# Patient Record
Sex: Female | Born: 1960
Health system: Southern US, Community
[De-identification: ages and names within clinical notes are randomized; demographics above are authoritative.]

## PROBLEM LIST (undated history)

## (undated) DIAGNOSIS — F329 Major depressive disorder, single episode, unspecified: Secondary | ICD-10-CM

## (undated) DIAGNOSIS — N319 Neuromuscular dysfunction of bladder, unspecified: Secondary | ICD-10-CM

## (undated) DIAGNOSIS — K219 Gastro-esophageal reflux disease without esophagitis: Secondary | ICD-10-CM

## (undated) DIAGNOSIS — R002 Palpitations: Secondary | ICD-10-CM

## (undated) DIAGNOSIS — K760 Fatty (change of) liver, not elsewhere classified: Secondary | ICD-10-CM

## (undated) DIAGNOSIS — T4145XA Adverse effect of unspecified anesthetic, initial encounter: Secondary | ICD-10-CM

## (undated) DIAGNOSIS — H539 Unspecified visual disturbance: Secondary | ICD-10-CM

## (undated) DIAGNOSIS — G2581 Restless legs syndrome: Secondary | ICD-10-CM

## (undated) DIAGNOSIS — I2510 Atherosclerotic heart disease of native coronary artery without angina pectoris: Secondary | ICD-10-CM

## (undated) DIAGNOSIS — I499 Cardiac arrhythmia, unspecified: Secondary | ICD-10-CM

## (undated) DIAGNOSIS — E039 Hypothyroidism, unspecified: Secondary | ICD-10-CM

## (undated) DIAGNOSIS — K5900 Constipation, unspecified: Secondary | ICD-10-CM

## (undated) DIAGNOSIS — I10 Essential (primary) hypertension: Secondary | ICD-10-CM

## (undated) DIAGNOSIS — R413 Other amnesia: Secondary | ICD-10-CM

## (undated) DIAGNOSIS — G629 Polyneuropathy, unspecified: Secondary | ICD-10-CM

## (undated) DIAGNOSIS — C801 Malignant (primary) neoplasm, unspecified: Secondary | ICD-10-CM

## (undated) DIAGNOSIS — G4730 Sleep apnea, unspecified: Secondary | ICD-10-CM

## (undated) DIAGNOSIS — M1990 Unspecified osteoarthritis, unspecified site: Secondary | ICD-10-CM

## (undated) DIAGNOSIS — C4490 Unspecified malignant neoplasm of skin, unspecified: Secondary | ICD-10-CM

## (undated) DIAGNOSIS — E119 Type 2 diabetes mellitus without complications: Secondary | ICD-10-CM

## (undated) DIAGNOSIS — G259 Extrapyramidal and movement disorder, unspecified: Secondary | ICD-10-CM

## (undated) DIAGNOSIS — Z8669 Personal history of other diseases of the nervous system and sense organs: Secondary | ICD-10-CM

## (undated) DIAGNOSIS — Z8489 Family history of other specified conditions: Secondary | ICD-10-CM

## (undated) DIAGNOSIS — F32A Depression, unspecified: Secondary | ICD-10-CM

## (undated) DIAGNOSIS — F419 Anxiety disorder, unspecified: Secondary | ICD-10-CM

## (undated) DIAGNOSIS — G35D Multiple sclerosis, unspecified: Secondary | ICD-10-CM

## (undated) DIAGNOSIS — G35 Multiple sclerosis: Secondary | ICD-10-CM

## (undated) DIAGNOSIS — R51 Headache: Secondary | ICD-10-CM

## (undated) DIAGNOSIS — E0590 Thyrotoxicosis, unspecified without thyrotoxic crisis or storm: Secondary | ICD-10-CM

## (undated) DIAGNOSIS — Z87898 Personal history of other specified conditions: Secondary | ICD-10-CM

## (undated) HISTORY — PX: TUBAL LIGATION: SHX77

## (undated) HISTORY — PX: ENDOMETRIAL ABLATION: SHX621

## (undated) HISTORY — DX: Atherosclerotic heart disease of native coronary artery without angina pectoris: I25.10

## (undated) HISTORY — DX: Type 2 diabetes mellitus without complications: E11.9

## (undated) HISTORY — DX: Essential (primary) hypertension: I10

## (undated) HISTORY — DX: Multiple sclerosis, unspecified: G35.D

## (undated) HISTORY — DX: Personal history of other diseases of the nervous system and sense organs: Z86.69

## (undated) HISTORY — DX: Unspecified malignant neoplasm of skin, unspecified: C44.90

## (undated) HISTORY — PX: CHOLECYSTECTOMY: SHX55

## (undated) HISTORY — DX: Unspecified visual disturbance: H53.9

## (undated) HISTORY — DX: Multiple sclerosis: G35

## (undated) HISTORY — DX: Polyneuropathy, unspecified: G62.9

## (undated) HISTORY — PX: TONSILLECTOMY: SUR1361

## (undated) HISTORY — DX: Extrapyramidal and movement disorder, unspecified: G25.9

## (undated) HISTORY — PX: JOINT REPLACEMENT: SHX530

## (undated) HISTORY — DX: Palpitations: R00.2

---

## 1998-01-09 ENCOUNTER — Ambulatory Visit (HOSPITAL_COMMUNITY): Admission: RE | Admit: 1998-01-09 | Discharge: 1998-01-09 | Payer: Self-pay | Admitting: Neurosurgery

## 1998-01-09 ENCOUNTER — Encounter: Payer: Self-pay | Admitting: Neurosurgery

## 2001-05-25 ENCOUNTER — Ambulatory Visit (HOSPITAL_COMMUNITY): Admission: RE | Admit: 2001-05-25 | Discharge: 2001-05-25 | Payer: Self-pay | Admitting: Internal Medicine

## 2001-05-25 ENCOUNTER — Encounter (INDEPENDENT_AMBULATORY_CARE_PROVIDER_SITE_OTHER): Payer: Self-pay | Admitting: Internal Medicine

## 2002-07-19 ENCOUNTER — Other Ambulatory Visit: Admission: RE | Admit: 2002-07-19 | Discharge: 2002-07-19 | Payer: Self-pay | Admitting: Obstetrics and Gynecology

## 2002-07-21 ENCOUNTER — Ambulatory Visit (HOSPITAL_COMMUNITY): Admission: RE | Admit: 2002-07-21 | Discharge: 2002-07-21 | Payer: Self-pay | Admitting: Obstetrics and Gynecology

## 2003-05-24 ENCOUNTER — Ambulatory Visit (HOSPITAL_COMMUNITY): Admission: RE | Admit: 2003-05-24 | Discharge: 2003-05-24 | Payer: Self-pay | Admitting: Obstetrics and Gynecology

## 2003-06-14 ENCOUNTER — Encounter (HOSPITAL_COMMUNITY): Admission: RE | Admit: 2003-06-14 | Discharge: 2003-06-15 | Payer: Self-pay | Admitting: Endocrinology

## 2003-07-13 ENCOUNTER — Other Ambulatory Visit: Admission: RE | Admit: 2003-07-13 | Discharge: 2003-07-13 | Payer: Self-pay | Admitting: Obstetrics and Gynecology

## 2003-11-14 ENCOUNTER — Ambulatory Visit (HOSPITAL_COMMUNITY): Admission: RE | Admit: 2003-11-14 | Discharge: 2003-11-14 | Payer: Self-pay | Admitting: Family Medicine

## 2003-11-22 ENCOUNTER — Encounter (HOSPITAL_COMMUNITY): Admission: RE | Admit: 2003-11-22 | Discharge: 2003-12-22 | Payer: Self-pay | Admitting: Emergency Medicine

## 2003-12-27 ENCOUNTER — Ambulatory Visit (HOSPITAL_COMMUNITY): Admission: RE | Admit: 2003-12-27 | Discharge: 2003-12-27 | Payer: Self-pay | Admitting: Neurology

## 2003-12-31 ENCOUNTER — Emergency Department (HOSPITAL_COMMUNITY): Admission: EM | Admit: 2003-12-31 | Discharge: 2003-12-31 | Payer: Self-pay | Admitting: Emergency Medicine

## 2003-12-31 ENCOUNTER — Encounter: Admission: RE | Admit: 2003-12-31 | Discharge: 2003-12-31 | Payer: Self-pay | Admitting: Neurology

## 2004-03-14 ENCOUNTER — Ambulatory Visit (HOSPITAL_COMMUNITY): Admission: RE | Admit: 2004-03-14 | Discharge: 2004-03-14 | Payer: Self-pay | Admitting: Endocrinology

## 2004-05-28 ENCOUNTER — Emergency Department (HOSPITAL_COMMUNITY): Admission: EM | Admit: 2004-05-28 | Discharge: 2004-05-28 | Payer: Self-pay | Admitting: Emergency Medicine

## 2007-03-23 ENCOUNTER — Ambulatory Visit (HOSPITAL_COMMUNITY): Admission: RE | Admit: 2007-03-23 | Discharge: 2007-03-23 | Payer: Self-pay | Admitting: *Deleted

## 2008-02-15 ENCOUNTER — Emergency Department (HOSPITAL_COMMUNITY): Admission: EM | Admit: 2008-02-15 | Discharge: 2008-02-15 | Payer: Self-pay | Admitting: Emergency Medicine

## 2010-03-31 ENCOUNTER — Encounter (INDEPENDENT_AMBULATORY_CARE_PROVIDER_SITE_OTHER): Payer: Self-pay | Admitting: *Deleted

## 2010-04-17 ENCOUNTER — Other Ambulatory Visit
Admission: RE | Admit: 2010-04-17 | Discharge: 2010-04-17 | Payer: Self-pay | Source: Home / Self Care | Admitting: Obstetrics & Gynecology

## 2010-04-21 ENCOUNTER — Encounter (HOSPITAL_COMMUNITY)
Admission: RE | Admit: 2010-04-21 | Discharge: 2010-05-21 | Payer: Self-pay | Source: Home / Self Care | Attending: Family Medicine | Admitting: Family Medicine

## 2010-04-23 ENCOUNTER — Ambulatory Visit (HOSPITAL_COMMUNITY)
Admission: RE | Admit: 2010-04-23 | Discharge: 2010-04-23 | Payer: Self-pay | Source: Home / Self Care | Attending: General Surgery | Admitting: General Surgery

## 2010-05-10 ENCOUNTER — Inpatient Hospital Stay (HOSPITAL_COMMUNITY)
Admission: EM | Admit: 2010-05-10 | Discharge: 2010-05-12 | Payer: Self-pay | Source: Home / Self Care | Attending: Internal Medicine | Admitting: Internal Medicine

## 2010-05-11 ENCOUNTER — Encounter (INDEPENDENT_AMBULATORY_CARE_PROVIDER_SITE_OTHER): Payer: Self-pay | Admitting: Internal Medicine

## 2010-05-19 LAB — CARDIAC PANEL(CRET KIN+CKTOT+MB+TROPI)
CK, MB: 0.8 ng/mL (ref 0.3–4.0)
CK, MB: 0.8 ng/mL (ref 0.3–4.0)
CK, MB: 1 ng/mL (ref 0.3–4.0)
Relative Index: INVALID (ref 0.0–2.5)
Relative Index: INVALID (ref 0.0–2.5)
Relative Index: INVALID (ref 0.0–2.5)
Total CK: 37 U/L (ref 7–177)
Total CK: 37 U/L (ref 7–177)
Total CK: 42 U/L (ref 7–177)
Troponin I: 0.01 ng/mL (ref 0.00–0.06)
Troponin I: 0.01 ng/mL (ref 0.00–0.06)
Troponin I: <0.01 ng/mL (ref 0.00–0.06)

## 2010-05-19 LAB — HEMOGLOBIN A1C
Hgb A1c MFr Bld: 6.4 % — ABNORMAL HIGH (ref <5.7)
Mean Plasma Glucose: 137 mg/dL — ABNORMAL HIGH (ref <117)

## 2010-05-19 LAB — URINE CULTURE
Colony Count: NO GROWTH
Culture  Setup Time: 201201082210
Culture: NO GROWTH
Special Requests: POSITIVE

## 2010-05-19 LAB — COMPREHENSIVE METABOLIC PANEL
ALT: 21 U/L (ref 0–35)
AST: 22 U/L (ref 0–37)
Albumin: 4 g/dL (ref 3.5–5.2)
Alkaline Phosphatase: 58 U/L (ref 39–117)
BUN: 14 mg/dL (ref 6–23)
CO2: 24 mEq/L (ref 19–32)
Calcium: 9.7 mg/dL (ref 8.4–10.5)
Chloride: 106 mEq/L (ref 96–112)
Creatinine, Ser: 0.88 mg/dL (ref 0.4–1.2)
GFR calc Af Amer: >60 mL/min (ref >60)
GFR calc non Af Amer: >60 mL/min (ref >60)
Glucose, Bld: 85 mg/dL (ref 70–99)
Potassium: 4.3 mEq/L (ref 3.5–5.1)
Sodium: 139 mEq/L (ref 135–145)
Total Bilirubin: 0.6 mg/dL (ref 0.3–1.2)
Total Protein: 6.8 g/dL (ref 6.0–8.3)

## 2010-05-19 LAB — LIPID PANEL
Cholesterol: 143 mg/dL (ref 0–200)
HDL: 22 mg/dL — ABNORMAL LOW (ref >39)
LDL Cholesterol: 69 mg/dL (ref 0–99)
Total CHOL/HDL Ratio: 6.5 RATIO
Triglycerides: 258 mg/dL — ABNORMAL HIGH (ref <150)
VLDL: 52 mg/dL — ABNORMAL HIGH (ref 0–40)

## 2010-05-19 LAB — DIFFERENTIAL
Basophils Absolute: 0 10*3/uL (ref 0.0–0.1)
Basophils Relative: 0 % (ref 0–1)
Eosinophils Absolute: 0.1 10*3/uL (ref 0.0–0.7)
Eosinophils Relative: 1 % (ref 0–5)
Lymphocytes Relative: 30 % (ref 12–46)
Lymphs Abs: 2.3 10*3/uL (ref 0.7–4.0)
Monocytes Absolute: 0.6 10*3/uL (ref 0.1–1.0)
Monocytes Relative: 8 % (ref 3–12)
Neutro Abs: 4.6 10*3/uL (ref 1.7–7.7)
Neutrophils Relative %: 61 % (ref 43–77)

## 2010-05-19 LAB — URINALYSIS, ROUTINE W REFLEX MICROSCOPIC
Bilirubin Urine: NEGATIVE
Hgb urine dipstick: NEGATIVE
Ketones, ur: NEGATIVE mg/dL
Nitrite: NEGATIVE
Protein, ur: NEGATIVE mg/dL
Specific Gravity, Urine: 1.031 — ABNORMAL HIGH (ref 1.005–1.030)
Urine Glucose, Fasting: NEGATIVE mg/dL
Urobilinogen, UA: 0.2 mg/dL (ref 0.0–1.0)
pH: 5.5 (ref 5.0–8.0)

## 2010-05-19 LAB — CBC
HCT: 36 % (ref 36.0–46.0)
HCT: 29.1 % — ABNORMAL LOW (ref 36.0–46.0)
Hemoglobin: 12.1 g/dL (ref 12.0–15.0)
Hemoglobin: 9.7 g/dL — ABNORMAL LOW (ref 12.0–15.0)
MCH: 28.3 pg (ref 26.0–34.0)
MCH: 28 pg (ref 26.0–34.0)
MCHC: 33.6 g/dL (ref 30.0–36.0)
MCHC: 33.3 g/dL (ref 30.0–36.0)
MCV: 84.3 fL (ref 78.0–100.0)
MCV: 84.1 fL (ref 78.0–100.0)
Platelets: 232 10*3/uL (ref 150–400)
Platelets: 159 10*3/uL (ref 150–400)
RBC: 4.27 MIL/uL (ref 3.87–5.11)
RBC: 3.46 MIL/uL — ABNORMAL LOW (ref 3.87–5.11)
RDW: 16.5 % — ABNORMAL HIGH (ref 11.5–15.5)
RDW: 16.4 % — ABNORMAL HIGH (ref 11.5–15.5)
WBC: 7.5 10*3/uL (ref 4.0–10.5)
WBC: 6 10*3/uL (ref 4.0–10.5)

## 2010-05-19 LAB — POCT PREGNANCY, URINE: Preg Test, Ur: NEGATIVE

## 2010-05-19 LAB — D-DIMER, QUANTITATIVE
D-Dimer, Quant: 0.23 ug/mL-FEU (ref 0.00–0.48)
D-Dimer, Quant: <0.22 ug/mL-FEU (ref 0.00–0.48)

## 2010-05-19 LAB — LIPASE, BLOOD: Lipase: 23 U/L (ref 11–59)

## 2010-05-19 LAB — URINE MICROSCOPIC-ADD ON

## 2010-05-19 LAB — GLUCOSE, CAPILLARY
Glucose-Capillary: 142 mg/dL — ABNORMAL HIGH (ref 70–99)
Glucose-Capillary: 148 mg/dL — ABNORMAL HIGH (ref 70–99)
Glucose-Capillary: 109 mg/dL — ABNORMAL HIGH (ref 70–99)

## 2010-05-19 LAB — PROTIME-INR
INR: 1.08 (ref 0.00–1.49)
Prothrombin Time: 14.2 seconds (ref 11.6–15.2)

## 2010-05-19 LAB — POCT CARDIAC MARKERS
CKMB, poc: <1 ng/mL — ABNORMAL LOW (ref 1.0–8.0)
Myoglobin, poc: 68 ng/mL (ref 12–200)
Troponin i, poc: <0.05 ng/mL (ref 0.00–0.09)

## 2010-05-19 LAB — BASIC METABOLIC PANEL
BUN: 7 mg/dL (ref 6–23)
CO2: 23 mEq/L (ref 19–32)
Calcium: 8.4 mg/dL (ref 8.4–10.5)
Chloride: 112 mEq/L (ref 96–112)
Creatinine, Ser: 0.96 mg/dL (ref 0.4–1.2)
GFR calc Af Amer: >60 mL/min (ref >60)
GFR calc non Af Amer: >60 mL/min (ref >60)
Glucose, Bld: 142 mg/dL — ABNORMAL HIGH (ref 70–99)
Potassium: 3.8 mEq/L (ref 3.5–5.1)
Sodium: 141 mEq/L (ref 135–145)

## 2010-05-19 LAB — T4, FREE: Free T4: 1.53 ng/dL (ref 0.80–1.80)

## 2010-05-19 LAB — TSH: TSH: 0.056 u[IU]/mL — ABNORMAL LOW (ref 0.350–4.500)

## 2010-05-19 LAB — APTT: aPTT: 28 seconds (ref 24–37)

## 2010-05-19 LAB — MAGNESIUM: Magnesium: 2.1 mg/dL (ref 1.5–2.5)

## 2010-05-19 LAB — MRSA PCR SCREENING: MRSA by PCR: NEGATIVE

## 2010-05-19 LAB — PHOSPHORUS: Phosphorus: 3.1 mg/dL (ref 2.3–4.6)

## 2010-06-03 NOTE — Letter (Signed)
Summary: Appointment - Missed  [facility]  [address] [place] [address]   Phone: [phone]  Fax: [phone]     March 31, 2010 MRN: [id number]   Mary Strong [address] [place], [place]  [address]   Dear Ms. Andrey Campanile,  Our records indicate you missed your appointment on      03/31/10                  with Dr.       Diona Browner.                                    It is very important that we reach you to reschedule this appointment. We look forward to participating in your health care needs. Please contact us at the number listed above at your earliest convenience to reschedule this appointment.     Sincerely,    Glass blower/designer

## 2010-06-21 ENCOUNTER — Other Ambulatory Visit: Payer: Self-pay | Admitting: Emergency Medicine

## 2010-06-21 ENCOUNTER — Emergency Department (HOSPITAL_COMMUNITY)
Admission: EM | Admit: 2010-06-21 | Discharge: 2010-06-21 | Disposition: A | Discharge status: Home / Self Care | Payer: 59 | Attending: Emergency Medicine | Admitting: Emergency Medicine

## 2010-06-21 DIAGNOSIS — E119 Type 2 diabetes mellitus without complications: Secondary | ICD-10-CM | POA: Insufficient documentation

## 2010-06-21 DIAGNOSIS — G35 Multiple sclerosis: Secondary | ICD-10-CM | POA: Insufficient documentation

## 2010-06-21 DIAGNOSIS — D649 Anemia, unspecified: Secondary | ICD-10-CM | POA: Insufficient documentation

## 2010-06-21 DIAGNOSIS — IMO0001 Reserved for inherently not codable concepts without codable children: Secondary | ICD-10-CM | POA: Insufficient documentation

## 2010-06-21 DIAGNOSIS — E0590 Thyrotoxicosis, unspecified without thyrotoxic crisis or storm: Secondary | ICD-10-CM | POA: Insufficient documentation

## 2010-06-21 DIAGNOSIS — I10 Essential (primary) hypertension: Secondary | ICD-10-CM | POA: Insufficient documentation

## 2010-06-21 LAB — BASIC METABOLIC PANEL
BUN: 14 mg/dL (ref 6–23)
CO2: 26 mEq/L (ref 19–32)
Calcium: 9 mg/dL (ref 8.4–10.5)
Chloride: 104 mEq/L (ref 96–112)
Creatinine, Ser: 0.83 mg/dL (ref 0.4–1.2)
GFR calc Af Amer: >60 mL/min (ref >60)
GFR calc non Af Amer: >60 mL/min (ref >60)
Glucose, Bld: 91 mg/dL (ref 70–99)
Potassium: 3.4 mEq/L — ABNORMAL LOW (ref 3.5–5.1)
Sodium: 138 mEq/L (ref 135–145)

## 2010-06-21 LAB — DIFFERENTIAL
Basophils Absolute: 0 10*3/uL (ref 0.0–0.1)
Basophils Relative: 0 % (ref 0–1)
Eosinophils Absolute: 0.2 10*3/uL (ref 0.0–0.7)
Eosinophils Relative: 2 % (ref 0–5)
Lymphocytes Relative: 31 % (ref 12–46)
Lymphs Abs: 2.6 10*3/uL (ref 0.7–4.0)
Monocytes Absolute: 0.5 10*3/uL (ref 0.1–1.0)
Monocytes Relative: 6 % (ref 3–12)
Neutro Abs: 5.2 10*3/uL (ref 1.7–7.7)
Neutrophils Relative %: 61 % (ref 43–77)

## 2010-06-21 LAB — CBC
HCT: 32.1 % — ABNORMAL LOW (ref 36.0–46.0)
Hemoglobin: 10.6 g/dL — ABNORMAL LOW (ref 12.0–15.0)
MCH: 28 pg (ref 26.0–34.0)
MCHC: 33 g/dL (ref 30.0–36.0)
MCV: 84.7 fL (ref 78.0–100.0)
Platelets: 201 10*3/uL (ref 150–400)
RBC: 3.79 MIL/uL — ABNORMAL LOW (ref 3.87–5.11)
RDW: 17.3 % — ABNORMAL HIGH (ref 11.5–15.5)
WBC: 8.5 10*3/uL (ref 4.0–10.5)

## 2010-06-21 LAB — URINALYSIS, ROUTINE W REFLEX MICROSCOPIC
Bilirubin Urine: NEGATIVE
Hgb urine dipstick: NEGATIVE
Ketones, ur: NEGATIVE mg/dL
Nitrite: NEGATIVE
Protein, ur: NEGATIVE mg/dL
Specific Gravity, Urine: 1.02 (ref 1.005–1.030)
Urine Glucose, Fasting: NEGATIVE mg/dL
Urobilinogen, UA: 0.2 mg/dL (ref 0.0–1.0)
pH: 6 (ref 5.0–8.0)

## 2010-06-21 LAB — CK TOTAL AND CKMB (NOT AT ARMC)
CK, MB: 1.2 ng/mL (ref 0.3–4.0)
Relative Index: INVALID (ref 0.0–2.5)
Total CK: 75 U/L (ref 7–177)

## 2010-06-21 LAB — TROPONIN I: Troponin I: <0.01 ng/mL (ref 0.00–0.06)

## 2010-07-14 LAB — CBC
HCT: 34.5 % — ABNORMAL LOW (ref 36.0–46.0)
Hemoglobin: 11.8 g/dL — ABNORMAL LOW (ref 12.0–15.0)
MCH: 28 pg (ref 26.0–34.0)
MCHC: 34.2 g/dL (ref 30.0–36.0)
MCV: 81.8 fL (ref 78.0–100.0)
Platelets: 219 10*3/uL (ref 150–400)
RBC: 4.22 MIL/uL (ref 3.87–5.11)
RDW: 16.4 % — ABNORMAL HIGH (ref 11.5–15.5)
WBC: 8.2 10*3/uL (ref 4.0–10.5)

## 2010-07-14 LAB — BASIC METABOLIC PANEL
BUN: 15 mg/dL (ref 6–23)
CO2: 25 mEq/L (ref 19–32)
Calcium: 9.4 mg/dL (ref 8.4–10.5)
Chloride: 103 mEq/L (ref 96–112)
Creatinine, Ser: 0.93 mg/dL (ref 0.4–1.2)
GFR calc Af Amer: >60 mL/min (ref >60)
GFR calc non Af Amer: >60 mL/min (ref >60)
Glucose, Bld: 103 mg/dL — ABNORMAL HIGH (ref 70–99)
Potassium: 4.3 mEq/L (ref 3.5–5.1)
Sodium: 138 mEq/L (ref 135–145)

## 2010-07-14 LAB — HEPATIC FUNCTION PANEL
ALT: 25 U/L (ref 0–35)
AST: 24 U/L (ref 0–37)
Albumin: 4 g/dL (ref 3.5–5.2)
Alkaline Phosphatase: 56 U/L (ref 39–117)
Bilirubin, Direct: 0.1 mg/dL (ref 0.0–0.3)
Indirect Bilirubin: 0.4 mg/dL (ref 0.3–0.9)
Total Bilirubin: 0.5 mg/dL (ref 0.3–1.2)
Total Protein: 6.3 g/dL (ref 6.0–8.3)

## 2010-07-14 LAB — SURGICAL PCR SCREEN
MRSA, PCR: NEGATIVE
Staphylococcus aureus: POSITIVE — AB

## 2010-07-14 LAB — GLUCOSE, CAPILLARY: Glucose-Capillary: 135 mg/dL — ABNORMAL HIGH (ref 70–99)

## 2010-09-19 NOTE — H&P (Signed)
NAME:  Mary Strong, Mary Strong                        ACCOUNT NO.:  [id number]   MEDICAL RECORD NO.:  [id number]                   PATIENT TYPE:  AMB   LOCATION:  DAY                                  FACILITY:  [facility]   PHYSICIAN:  Tilda Burrow, M.D.              DATE OF BIRTH:  1960-12-05   DATE OF ADMISSION:  DATE OF DISCHARGE:                                HISTORY & PHYSICAL   PREOPERATIVE DIAGNOSIS:  Menometrorrhagia.   HISTORY OF PRESENT ILLNESS:  This 50 year old is admitted for endometrial  ablation.  The patient has been seen in our office.  A recent Pap smear  showed mild dysplasia.  She has been evaluated with colposcopy today, July 19, 2002, showing no visible dysplasia.  It is felt that conservative  management is appropriate.   The patient is admitted at this time complaining of heavy periods lasting 9-  10 days per cycle with heavy flow.  The patient uses 48 super tampons per  months.  She has been mildly anemia due to the periods recently.  The  patient has been seen in our office and has had an ultrasound which shows an  8.7 cm long uterus with a thin endometrial stripe and no adnexal pathology.  There is a visible old C-section scar and irregularity to the anterior lower  uterine segment.  The patient is considered a good candidate for endometrial  ablation and desires to proceed in that direction.  The pros and cons of the  procedure have been performed using Gynecare endometrial ablation  instructional pamphlets.   PAST MEDICAL HISTORY:  Positive for hypertension currently not requiring  medication, though anxiety of the preoperative visit raised her blood  pressure to 180/112.  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  Final postoperative was 130/80.   PHYSICAL EXAMINATION:  HEIGHT:  5 feet 5 inches.  WEIGHT:  221 pounds.  VITAL SIGNS:  Blood pressure 130/80.  GENERAL APPEARANCE:  A large-framed Caucasian female.  HEENT:  Pupils equal, round, and reactive.  Extraocular movements intact.  NECK:  Supple.  Trachea midline.  ABDOMEN:  Obese without masses.  Well-healed surgical scar.  PELVIC:  External genitalia normal.  Cervix tiny with colposcopic  visualization showing no visible dysplasia.  Endocervical curettage is  pending.  The uterus is normal in size, shape, and contour with adequate  mobility.  Adnexa negative for masses.   ASSESSMENT:  Menorrhagia.   PLAN:  Hysteroscopy, D&C, and endometrial ablation on July 21, 2002.                                               Tilda Burrow, M.D.   JVF/MEDQ  D:  07/19/2002  T:  07/19/2002  Job:  [id number]

## 2010-09-19 NOTE — Op Note (Signed)
[name], Mary Strong                        ACCOUNT NO.:  [id number]   MEDICAL RECORD NO.:  [id number]                   PATIENT TYPE:  OUT   LOCATION:  MDC                                  FACILITY:  [facility]   PHYSICIAN:  Michael L. Thad Ranger, M.D.           DATE OF BIRTH:  1960-07-25   DATE OF PROCEDURE:  12/27/2003  DATE OF DISCHARGE:                                 OPERATIVE REPORT   PROCEDURE PERFORMED:  Diagnostic/therapeutic lumbar puncture.   OPERATOR:  Michael L. Thad Ranger, M.D.   INDICATIONS FOR PROCEDURE:  Suspected multiple sclerosis and suspected  pseudotumor cerebri.   DESCRIPTION OF PROCEDURE:  Informed consent was obtained and signed and  placed on the chart after the procedure, risks and benefits were explained  and the patient agreed to proceed.  She was placed in the right lateral  decubitus position and prepped and draped in the usual sterile fashion.  Local anesthesia was achieved with 2 mL of lidocaine.  Two attempts were  made at reaching the intraspinal space with a standard 20 gauge 3-1/2-inch  needle but these were unsuccessful due to the depth of the space.  After  additional local anesthesia, a 6 inch 22 gauge spinal needle was inserted at  the L3-4 interspace and advanced until clear CSF was obtained.  Opening  pressure was measured at 300 mmH2O.  Approximately 6 mL of clear spinal  fluid was obtained and sent for laboratory analysis as follows.  Tube #1, 2  mL, cell count, differential.  Tube #2, 2 mL, glucose and protein,  oligoclonal banding analysis.  Tube #3, 2 mL, to be held in lab.  Additional  spinal fluid, approximately 15 mL was withdrawn to reach a final closing  pressure of 150 mmH2O.  The patient did complain of headache at the end of  the procedure which subsequently improved.  The needle was withdrawn.  Hemostasis was obtained.  No immediate complications were noted.  The  patient was advised to remain flat one hour after the procedure and  then can  be discharged home.  She was advised to call or come to the emergency room  immediately if she develops fever, mental status changes, neck stiffness,  unrelenting back pain or weakness of the lower extremities and was advised  to call the office in the next couple of days if she developed postural  headache.                                               Michael L. Thad Ranger, M.D.    MLR/MEDQ  D:  12/27/2003  T:  12/27/2003  Job:  [id number]

## 2010-09-19 NOTE — Op Note (Signed)
   Mary Strong, Mary Strong                        ACCOUNT NO.:  [id number]   MEDICAL RECORD NO.:  [id number]                   PATIENT TYPE:  AMB   LOCATION:  DAY                                  FACILITY:  [facility]   PHYSICIAN:  Tilda Burrow, M.D.              DATE OF BIRTH:  Apr 08, 1961   DATE OF PROCEDURE:  07/21/2002  DATE OF DISCHARGE:  07/21/2002                                 OPERATIVE REPORT   PREOPERATIVE DIAGNOSIS:  Menorrhagia.   POSTOPERATIVE DIAGNOSIS:  Menorrhagia.   PROCEDURE:  Hysteroscopy, D&C, endometrial ablation.   SURGEON:  Tilda Burrow, M.D.   ASSISTANTJohnny Bridge ______.   ANESTHESIA:  General.   COMPLICATIONS:  None.   FINDINGS:  The uterus sounded to 9.5 cm.  Generous endometrial tissue was  obtained prior to endometrial ablation.   DESCRIPTION OF PROCEDURE:  The patient was taken to the operating room and  received IV Levaquin preoperatively with Toradol 30 mg IV preoperatively.  She was taken to the OR and general anesthesia introduced.  The legs were  supported in [place]  low lithotomy supports, and the uterus sounded to  9.5 cm, dilated to 31 [place], allowing introduction of the diagnostic  hysteroscope without difficulty.  The uterus was visualized and documented  as having essentially normal contours.  A generous amount of somewhat shaggy  tissue was identified, and sharp curettage obtained five tissue samples from  that area and reduced the endometrial tissue bulk.  The patient then had  introduction of the Gynecare ThermaChoice endometrial thermal ablation  balloon with dilation and use through a standard eight minute time sequence.  The procedure was completed.  The balloon was deflated.  The patient left  for the recovery room in good condition.   The pathology report showed dyssynchronous endometrium with inflammatory  components, no hyperplasia or malignancy identified.                                               Tilda Burrow, M.D.    JVF/MEDQ  D:  09/11/2002  T:  09/12/2002  Job:  [id number]

## 2011-02-10 LAB — CREATININE, SERUM
Creatinine, Ser: 1.01
GFR calc Af Amer: >60
GFR calc non Af Amer: 59 — ABNORMAL LOW

## 2011-04-03 ENCOUNTER — Other Ambulatory Visit (HOSPITAL_COMMUNITY): Payer: Self-pay | Admitting: *Deleted

## 2011-05-11 ENCOUNTER — Other Ambulatory Visit (HOSPITAL_COMMUNITY)
Admission: RE | Admit: 2011-05-11 | Discharge: 2011-05-11 | Disposition: A | Discharge status: Home / Self Care | Payer: BC Managed Care – PPO | Source: Ambulatory Visit | Attending: Obstetrics & Gynecology | Admitting: Obstetrics & Gynecology

## 2011-05-11 ENCOUNTER — Other Ambulatory Visit: Payer: Self-pay | Admitting: Obstetrics & Gynecology

## 2011-05-11 DIAGNOSIS — Z01419 Encounter for gynecological examination (general) (routine) without abnormal findings: Secondary | ICD-10-CM | POA: Insufficient documentation

## 2013-06-26 ENCOUNTER — Encounter (INDEPENDENT_AMBULATORY_CARE_PROVIDER_SITE_OTHER): Payer: Self-pay | Admitting: *Deleted

## 2013-07-18 ENCOUNTER — Encounter (INDEPENDENT_AMBULATORY_CARE_PROVIDER_SITE_OTHER): Payer: Self-pay | Admitting: *Deleted

## 2013-09-28 DIAGNOSIS — M545 Low back pain, unspecified: Secondary | ICD-10-CM | POA: Diagnosis not present

## 2013-09-28 DIAGNOSIS — R5381 Other malaise: Secondary | ICD-10-CM | POA: Diagnosis not present

## 2013-09-28 DIAGNOSIS — R5383 Other fatigue: Secondary | ICD-10-CM | POA: Diagnosis not present

## 2013-09-28 DIAGNOSIS — R269 Unspecified abnormalities of gait and mobility: Secondary | ICD-10-CM | POA: Diagnosis not present

## 2013-09-28 DIAGNOSIS — Z79899 Other long term (current) drug therapy: Secondary | ICD-10-CM | POA: Diagnosis not present

## 2013-09-28 DIAGNOSIS — G35D Multiple sclerosis, unspecified: Secondary | ICD-10-CM | POA: Diagnosis not present

## 2013-09-28 DIAGNOSIS — G35 Multiple sclerosis: Secondary | ICD-10-CM | POA: Diagnosis not present

## 2013-10-18 DIAGNOSIS — K219 Gastro-esophageal reflux disease without esophagitis: Secondary | ICD-10-CM | POA: Diagnosis not present

## 2013-10-18 DIAGNOSIS — E782 Mixed hyperlipidemia: Secondary | ICD-10-CM | POA: Diagnosis not present

## 2013-10-18 DIAGNOSIS — M129 Arthropathy, unspecified: Secondary | ICD-10-CM | POA: Diagnosis not present

## 2013-10-18 DIAGNOSIS — E119 Type 2 diabetes mellitus without complications: Secondary | ICD-10-CM | POA: Diagnosis not present

## 2013-10-23 DIAGNOSIS — F3289 Other specified depressive episodes: Secondary | ICD-10-CM | POA: Diagnosis not present

## 2013-10-23 DIAGNOSIS — K219 Gastro-esophageal reflux disease without esophagitis: Secondary | ICD-10-CM | POA: Diagnosis not present

## 2013-10-23 DIAGNOSIS — E782 Mixed hyperlipidemia: Secondary | ICD-10-CM | POA: Diagnosis not present

## 2013-10-23 DIAGNOSIS — L2089 Other atopic dermatitis: Secondary | ICD-10-CM | POA: Diagnosis not present

## 2013-10-23 DIAGNOSIS — Z1331 Encounter for screening for depression: Secondary | ICD-10-CM | POA: Diagnosis not present

## 2013-10-23 DIAGNOSIS — E039 Hypothyroidism, unspecified: Secondary | ICD-10-CM | POA: Diagnosis not present

## 2013-10-23 DIAGNOSIS — E119 Type 2 diabetes mellitus without complications: Secondary | ICD-10-CM | POA: Diagnosis not present

## 2013-10-23 DIAGNOSIS — F329 Major depressive disorder, single episode, unspecified: Secondary | ICD-10-CM | POA: Diagnosis not present

## 2013-10-23 DIAGNOSIS — E669 Obesity, unspecified: Secondary | ICD-10-CM | POA: Diagnosis not present

## 2013-11-10 DIAGNOSIS — R209 Unspecified disturbances of skin sensation: Secondary | ICD-10-CM | POA: Diagnosis not present

## 2013-11-10 DIAGNOSIS — Z79899 Other long term (current) drug therapy: Secondary | ICD-10-CM | POA: Diagnosis not present

## 2013-11-10 DIAGNOSIS — R5381 Other malaise: Secondary | ICD-10-CM | POA: Diagnosis not present

## 2013-11-10 DIAGNOSIS — R5383 Other fatigue: Secondary | ICD-10-CM | POA: Diagnosis not present

## 2013-11-10 DIAGNOSIS — G35 Multiple sclerosis: Secondary | ICD-10-CM | POA: Diagnosis not present

## 2013-12-11 DIAGNOSIS — G35D Multiple sclerosis, unspecified: Secondary | ICD-10-CM | POA: Diagnosis not present

## 2013-12-11 DIAGNOSIS — R209 Unspecified disturbances of skin sensation: Secondary | ICD-10-CM | POA: Diagnosis not present

## 2013-12-11 DIAGNOSIS — G35 Multiple sclerosis: Secondary | ICD-10-CM | POA: Diagnosis not present

## 2013-12-11 DIAGNOSIS — R5381 Other malaise: Secondary | ICD-10-CM | POA: Diagnosis not present

## 2013-12-11 DIAGNOSIS — Z79899 Other long term (current) drug therapy: Secondary | ICD-10-CM | POA: Diagnosis not present

## 2013-12-11 DIAGNOSIS — R5383 Other fatigue: Secondary | ICD-10-CM | POA: Diagnosis not present

## 2014-01-11 DIAGNOSIS — R5381 Other malaise: Secondary | ICD-10-CM | POA: Diagnosis not present

## 2014-01-11 DIAGNOSIS — R5383 Other fatigue: Secondary | ICD-10-CM | POA: Diagnosis not present

## 2014-01-11 DIAGNOSIS — R209 Unspecified disturbances of skin sensation: Secondary | ICD-10-CM | POA: Diagnosis not present

## 2014-01-11 DIAGNOSIS — G35D Multiple sclerosis, unspecified: Secondary | ICD-10-CM | POA: Diagnosis not present

## 2014-01-11 DIAGNOSIS — G35 Multiple sclerosis: Secondary | ICD-10-CM | POA: Diagnosis not present

## 2014-01-11 DIAGNOSIS — M79609 Pain in unspecified limb: Secondary | ICD-10-CM | POA: Diagnosis not present

## 2014-01-15 DIAGNOSIS — Z23 Encounter for immunization: Secondary | ICD-10-CM | POA: Diagnosis not present

## 2014-02-08 DIAGNOSIS — G35 Multiple sclerosis: Secondary | ICD-10-CM | POA: Diagnosis not present

## 2014-02-08 DIAGNOSIS — Z418 Encounter for other procedures for purposes other than remedying health state: Secondary | ICD-10-CM | POA: Diagnosis not present

## 2014-02-21 DIAGNOSIS — E782 Mixed hyperlipidemia: Secondary | ICD-10-CM | POA: Diagnosis not present

## 2014-02-21 DIAGNOSIS — E039 Hypothyroidism, unspecified: Secondary | ICD-10-CM | POA: Diagnosis not present

## 2014-02-21 DIAGNOSIS — D649 Anemia, unspecified: Secondary | ICD-10-CM | POA: Diagnosis not present

## 2014-02-21 DIAGNOSIS — E119 Type 2 diabetes mellitus without complications: Secondary | ICD-10-CM | POA: Diagnosis not present

## 2014-02-28 DIAGNOSIS — E119 Type 2 diabetes mellitus without complications: Secondary | ICD-10-CM | POA: Diagnosis not present

## 2014-02-28 DIAGNOSIS — E039 Hypothyroidism, unspecified: Secondary | ICD-10-CM | POA: Diagnosis not present

## 2014-02-28 DIAGNOSIS — E8881 Metabolic syndrome: Secondary | ICD-10-CM | POA: Diagnosis not present

## 2014-02-28 DIAGNOSIS — F331 Major depressive disorder, recurrent, moderate: Secondary | ICD-10-CM | POA: Diagnosis not present

## 2014-02-28 DIAGNOSIS — D649 Anemia, unspecified: Secondary | ICD-10-CM | POA: Diagnosis not present

## 2014-02-28 DIAGNOSIS — G35 Multiple sclerosis: Secondary | ICD-10-CM | POA: Diagnosis not present

## 2014-02-28 DIAGNOSIS — K219 Gastro-esophageal reflux disease without esophagitis: Secondary | ICD-10-CM | POA: Diagnosis not present

## 2014-02-28 DIAGNOSIS — E782 Mixed hyperlipidemia: Secondary | ICD-10-CM | POA: Diagnosis not present

## 2014-03-08 DIAGNOSIS — R5383 Other fatigue: Secondary | ICD-10-CM | POA: Diagnosis not present

## 2014-03-08 DIAGNOSIS — R202 Paresthesia of skin: Secondary | ICD-10-CM | POA: Diagnosis not present

## 2014-03-08 DIAGNOSIS — R260 Ataxic gait: Secondary | ICD-10-CM | POA: Diagnosis not present

## 2014-03-08 DIAGNOSIS — G35 Multiple sclerosis: Secondary | ICD-10-CM | POA: Diagnosis not present

## 2014-04-12 DIAGNOSIS — G35 Multiple sclerosis: Secondary | ICD-10-CM | POA: Diagnosis not present

## 2014-04-12 DIAGNOSIS — Z418 Encounter for other procedures for purposes other than remedying health state: Secondary | ICD-10-CM | POA: Diagnosis not present

## 2014-04-12 DIAGNOSIS — M79609 Pain in unspecified limb: Secondary | ICD-10-CM | POA: Diagnosis not present

## 2014-04-12 DIAGNOSIS — M545 Low back pain: Secondary | ICD-10-CM | POA: Diagnosis not present

## 2014-04-12 DIAGNOSIS — R202 Paresthesia of skin: Secondary | ICD-10-CM | POA: Diagnosis not present

## 2014-05-23 ENCOUNTER — Telehealth: Payer: Self-pay | Admitting: Neurology

## 2014-05-23 NOTE — Telephone Encounter (Signed)
Spoke with Mary Strong and advised I am not able to complete pa until she is seen.  She verbalized understanding of same/fim

## 2014-05-23 NOTE — Telephone Encounter (Signed)
Patient stated [facility] will fax paperwork for prior authorization for Tecfidera.  Explained to patient appointment was required prior to paperwork being completed for Prior Authorization.  Patient verbalized understanding.  Patient is scheduled 05/31/14 at 1:30.  Please call and advise.

## 2014-05-31 ENCOUNTER — Encounter: Payer: Self-pay | Admitting: Neurology

## 2014-05-31 ENCOUNTER — Ambulatory Visit (INDEPENDENT_AMBULATORY_CARE_PROVIDER_SITE_OTHER): Payer: Medicare Other | Admitting: Neurology

## 2014-05-31 VITALS — BP 110/58 | HR 96 | Resp 14 | Ht 63.0 in | Wt 209.4 lb

## 2014-05-31 DIAGNOSIS — F32A Depression, unspecified: Secondary | ICD-10-CM

## 2014-05-31 DIAGNOSIS — G35 Multiple sclerosis: Secondary | ICD-10-CM | POA: Diagnosis not present

## 2014-05-31 DIAGNOSIS — R208 Other disturbances of skin sensation: Secondary | ICD-10-CM

## 2014-05-31 DIAGNOSIS — R5382 Chronic fatigue, unspecified: Secondary | ICD-10-CM | POA: Diagnosis not present

## 2014-05-31 DIAGNOSIS — G4700 Insomnia, unspecified: Secondary | ICD-10-CM | POA: Diagnosis not present

## 2014-05-31 DIAGNOSIS — G801 Spastic diplegic cerebral palsy: Secondary | ICD-10-CM | POA: Diagnosis not present

## 2014-05-31 DIAGNOSIS — G894 Chronic pain syndrome: Secondary | ICD-10-CM

## 2014-05-31 DIAGNOSIS — F329 Major depressive disorder, single episode, unspecified: Secondary | ICD-10-CM

## 2014-05-31 MED ORDER — TIZANIDINE HCL 4 MG PO TABS: 4.0000 mg | ORAL_TABLET | Freq: Three times a day (TID) | ORAL | Status: DC

## 2014-05-31 NOTE — Progress Notes (Signed)
[facility]  PATIENT: Mary Strong DOB: 10-Dec-1960  REFERRING CLINICIAN: Gar Ponto  HISTORY FROM: patient REASON FOR VISIT: MS and pain   HISTORICAL  CHIEF COMPLAINT:  Chief Complaint  Patient presents with  . Multiple Sclerosis    HISTORY OF PRESENT ILLNESS:  Mary Strong is a 54 yo woman who was diagnosed with multiple sclerosis in 2005 after presenting with least gait, muscle spasms and pain. SHe was found to have a transverse myelitis. Initially, she was placed on Avonex. Due to worsening symptoms, she was switched to Tysabri around 2010 or 2011 and had 52 doses. However, she had converted to JCV antibody positive status and her titer rose over the past couple of determinations. Because of that, the Tysabri was discontinued as she had a higher risk of PML and she was started on Tecfidera a few months ago. She tolerates Tecfidera fairly well.  She has some difficulty with her gait and notes that the left leg gives out more than the right. There is leg spasticity as well. Symptoms are much milder in the hands and arms but she will sometimes get some tingling in the hands and sometimes her fingers draw up a bit.  She notes a lot of pain in the legs and this has worsened over the last month. Pain is described as stabbing and viselike. Motrin has not helped. She is on lamotrigine for dysesthetic pain but that has not helped this pain much.  Lamotrigine has helped somewhat the pain she was having in her feet but not the more severe pain she is having higher up in the legs.   The worst pain is in the thighs and the knee area. She is also on duloxetine that sometimes helps pain.    He also is noticing more spasticity in the legs. She takes diazepam, 15 mg at bedtime. That no longer is helping her sleep as it did in the past.  She gets dysesthesias in the perineum as well with a warm liquid sensation or itching this has started more recently. It does not appear to  be helped by the lamotrigine.  She notes that she is having more fatigue the past year. Additionally she notes that she sleeps poorly and has slept worse the past few months.    She has both sleep onset insomnia and sleep maintenance insomnia. Also he seemed to be worse due to discomfort. She has had some depression in the past and is currently on duloxetine 60 mg daily.  She continues to be tearful at times.       REVIEW OF SYSTEMS:  Constitutional: No fevers, chills, sweats, or change in appetite.  She has fatigue Eyes: No visual changes, double vision, eye pain Ear, nose and throat: No hearing loss, ear pain, nasal congestion, sore throat Cardiovascular: No chest pain, palpitations Respiratory:  No shortness of breath at rest or with exertion.   No wheezes GastrointestinaI: No nausea, vomiting, diarrhea, abdominal pain, fecal incontinence Genitourinary:  No dysuria, urinary retention or frequency.  No nocturia. Musculoskeletal:  She notes pain in back, hips, legs Integumentary: No rash, pruritus, skin lesions Neurological: as above Psychiatric: She has depression and mild anxiety Endocrine: No palpitations, diaphoresis, change in appetite, change in weigh or increased thirst Hematologic/Lymphatic:  No anemia, purpura, petechiae. Allergic/Immunologic: No itchy/runny eyes, nasal congestion, recent allergic reactions, rashes  ALLERGIES: Allergies  Allergen Reactions  . Shellfish Allergy Anaphylaxis  . Copaxone [Glatiramer Acetate] Hives  . Mycinaire Nasal Mist [Sodium Chloride]   .  Penicillins Hives  . Vancomycin Itching    HOME MEDICATIONS:   Current outpatient prescriptions:  .  Biotin 5000 MCG CAPS, Take by mouth daily., Disp: , Rfl:  .  diazepam (VALIUM) 5 MG tablet, Take 5 mg by mouth 3 (three) times daily., Disp: , Rfl:  .  Dimethyl Fumarate 240 MG CPDR, Take 240 mg by mouth 2 (two) times daily., Disp: , Rfl:  .  DULoxetine (CYMBALTA) 60 MG capsule, Take 60 mg by mouth at  bedtime., Disp: , Rfl:  .  ibuprofen (ADVIL,MOTRIN) 800 MG tablet, Take 800 mg by mouth 2 (two) times daily., Disp: , Rfl:  .  lamoTRIgine (LAMICTAL) 200 MG tablet, Take 200 mg by mouth 2 (two) times daily., Disp: , Rfl:  .  levothyroxine (SYNTHROID, LEVOTHROID) 137 MCG tablet, Take 137 mcg by mouth daily., Disp: , Rfl:  .  lisinopril-hydrochlorothiazide (PRINZIDE,ZESTORETIC) 20-12.5 MG per tablet, daily., Disp: , Rfl:  .  mometasone (ELOCON) 0.1 % cream, , Disp: , Rfl:  .  omeprazole (PRILOSEC) 20 MG capsule, Take 20 mg by mouth daily., Disp: , Rfl:    PAST MEDICAL HISTORY: Past Medical History  Diagnosis Date  . Palpitations   . Diabetes mellitus without complication   . Multiple sclerosis   . Movement disorder   . Hypertension   . Vision abnormalities   . Neuropathy     PAST SURGICAL HISTORY: Past Surgical History  Procedure Laterality Date  . Cholecystectomy    . Cesarean section    . Tonsillectomy      FAMILY HISTORY: Family History  Problem Relation Age of Onset  . Multiple sclerosis Mother   . Arthritis/Rheumatoid Mother   . Psoriasis Father     SOCIAL HISTORY:  History   Social History  . Marital Status: Married    Spouse Name: N/A    Number of Children: N/A  . Years of Education: N/A   Occupational History  . Not on file.   Social History Main Topics  . Smoking status: Former Research scientist (life sciences)  . Smokeless tobacco: Not on file  . Alcohol Use: 0.0 oz/week    0 Not specified per week     Comment: Rarely  . Drug Use: Yes     Comment: occasional marijuana  . Sexual Activity: Not on file   Other Topics Concern  . Not on file   Social History Narrative  . No narrative on file     PHYSICAL EXAM  Filed Vitals:   05/31/14 1337  BP: 110/58  Pulse: 96  Resp: 14  Height: $Remove'5\' 3"'zGbEaYB$  (1.6 m)  Weight: 209 lb 6.4 oz (94.983 kg)    Body mass index is 37.1 kg/(m^2).   General: The patient is well-developed and well-nourished and in no acute distress  Eyes:   Funduscopic exam shows normal optic discs and retinal vessels.  Neck: The neck is supple, no carotid bruits are noted.  The neck is nontender.  Respiratory: The respiratory examination is clear.  Cardiovascular: The cardiovascular examination reveals a regular rate and rhythm, no murmurs, gallops or rubs are noted.  Skin: Extremities are without significant edema.  Neurologic Exam  Mental status: The patient is alert and oriented x 3 at the time of the examination. The patient has apparent normal recent and remote memory, with an apparently normal attention span and concentration ability.   Speech is normal.  Mood:  She is tearful initially and expresses frustration with her pain.    Cranial nerves: Extraocular movements are  full. Pupils are equal, round, and reactive to light and accomodation.  Visual fields are full.  Facial symmetry is present. There is good facial sensation to soft touch bilaterally.Facial strength is normal.  Trapezius and sternocleidomastoid strength is normal. No dysarthria is noted.  The tongue is midline, and the patient has symmetric elevation of the soft palate. No obvious hearing deficits are noted.  Motor:  Muscle bulk is normal and tone is mildly increased in legs.   Strength is  5 / 5 in all 4 extremities.   Sensory: Sensory testing is intact to pinprick, soft touch, vibration sensation in arms but she reports decreased sensation in left leg to these modalities.     Coordination: Cerebellar testing reveals good finger-nose-finger and mildly reduced  heel-to-shin bilaterally.  Gait and station: Station is normal and gait is arthritic and wide.   Tandem gait is wide. Romberg is negative.   Reflexes: Deep tendon reflexes are symmetric and 3+ bilaterally. Plantar responses are normal.    DIAGNOSTIC DATA (LABS, IMAGING, TESTING) - I reviewed patient records, labs, notes, testing and imaging myself where available.  Lab Results  Component Value Date   WBC  8.5 06/21/2010   HGB 10.6* 06/21/2010   HCT 32.1* 06/21/2010   MCV 84.7 06/21/2010   PLT 201 06/21/2010      Component Value Date/Time   NA 138 06/21/2010 2019   K 3.4* 06/21/2010 2019   CL 104 06/21/2010 2019   CO2 26 06/21/2010 2019   GLUCOSE 91 06/21/2010 2019   BUN 14 06/21/2010 2019   CREATININE 0.83 06/21/2010 2019   CALCIUM 9.0 06/21/2010 2019   PROT 6.8 05/10/2010 1514   ALBUMIN 4.0 05/10/2010 1514   AST 22 05/10/2010 1514   ALT 21 05/10/2010 1514   ALKPHOS 58 05/10/2010 1514   BILITOT 0.6 05/10/2010 1514   GFRNONAA >60 06/21/2010 2019   GFRAA  06/21/2010 2019    >60        The eGFR has been calculated using the MDRD equation. This calculation has not been validated in all clinical situations. eGFR's persistently <60 mL/min signify possible Chronic Kidney Disease.   Lab Results  Component Value Date   CHOL  05/10/2010    143        ATP III CLASSIFICATION:  <200     mg/dL   Desirable  200-239  mg/dL   Borderline High  >=240    mg/dL   High          HDL 22* 05/10/2010   LDLCALC  05/10/2010    69        Total Cholesterol/HDL:CHD Risk Coronary Heart Disease Risk Table                     Men   Women  1/2 Average Risk   3.4   3.3  Average Risk       5.0   4.4  2 X Average Risk   9.6   7.1  3 X Average Risk  23.4   11.0        Use the calculated Patient Ratio above and the CHD Risk Table to determine the patient's CHD Risk.        ATP III CLASSIFICATION (LDL):  <100     mg/dL   Optimal  100-129  mg/dL   Near or Above                    Optimal  130-159  mg/dL   Borderline  160-189  mg/dL   High  >190     mg/dL   Very High   TRIG 258* 05/10/2010   CHOLHDL 6.5 05/10/2010   Lab Results  Component Value Date   HGBA1C * 05/10/2010    6.4 (NOTE)                                                                       According to the ADA Clinical Practice Recommendations for 2011, when HbA1c is used as a screening test:   >=6.5%   Diagnostic of  Diabetes Mellitus           (if abnormal result  is confirmed)  5.7-6.4%   Increased risk of developing Diabetes Mellitus  References:Diagnosis and Classification of Diabetes Mellitus,Diabetes OVFI,4332,95(JOACZ 1):S62-S69 and Standards of Medical Care in         Diabetes - 2011,Diabetes Care,2011,34  (Suppl 1):S11-S61.   No results found for: VITAMINB12 Lab Results  Component Value Date   TSH 0.056* 05/10/2010        ASSESSMENT AND PLAN  Multiple sclerosis - Plan: CBC with Differential/Platelet  Spastic diplegia  Dysesthesia  Chronic fatigue  Insomnia  Depression  Chronic pain syndrome   In summary, Mary Strong is a 54 yo woman with an episode of transverse myelitis who was subsequently diagnosed with MS.   She switched toTecfidera recently and tolerates it well.   She has been much worse with more pain in her legs.   This seems to be a combination of dysesthetic pain and muscle spasticity.    She is sleeping poorly.  To try to help the pain, spasticity and sleep, I'll start tizanidine and have her titrate up to 4 mg po tid.  We can increase if well tolerated.   She is advised to stay active.   MRI's have been fairly stable over the past few years.  She will return in 4 months, sooner if new or worsening neurologic symptoms.      Richard A. Felecia Shelling, MD, PhD 6/60/6301, 6:01 PM Certified in Neurology, Clinical Neurophysiology, Sleep Medicine, Pain Medicine and Neuroimaging  [facility] [address] [place], [place] [address] [phone]

## 2014-06-01 ENCOUNTER — Telehealth: Payer: Self-pay | Admitting: Neurology

## 2014-06-01 LAB — CBC WITH DIFFERENTIAL/PLATELET
BASOS ABS: 0 10*3/uL (ref 0.0–0.2)
BASOS: 0 %
EOS ABS: 0.3 10*3/uL (ref 0.0–0.4)
EOS: 3 %
HCT: 38.9 % (ref 34.0–46.6)
Hemoglobin: 13.2 g/dL (ref 11.1–15.9)
IMMATURE GRANS (ABS): 0 10*3/uL (ref 0.0–0.1)
IMMATURE GRANULOCYTES: 0 %
Lymphocytes Absolute: 2.1 10*3/uL (ref 0.7–3.1)
Lymphs: 22 %
MCH: 29.8 pg (ref 26.6–33.0)
MCHC: 33.9 g/dL (ref 31.5–35.7)
MCV: 88 fL (ref 79–97)
MONOCYTES: 7 %
Monocytes Absolute: 0.7 10*3/uL (ref 0.1–0.9)
NEUTROS ABS: 6.5 10*3/uL (ref 1.4–7.0)
Neutrophils Relative %: 68 %
Platelets: 295 10*3/uL (ref 150–379)
RBC: 4.43 x10E6/uL (ref 3.77–5.28)
RDW: 15.6 % — AB (ref 12.3–15.4)
WBC: 9.6 10*3/uL (ref 3.4–10.8)

## 2014-06-01 MED ORDER — TIZANIDINE HCL 4 MG PO TABS: 4.0000 mg | ORAL_TABLET | Freq: Three times a day (TID) | ORAL | Status: DC

## 2014-06-01 NOTE — Telephone Encounter (Signed)
Pt is calling stating that she needs a new Rx for tiZANidine (ZANAFLEX) 4 MG tablet to be sent to [facility] in [place].  The one that was faxed was written for 905 tablets.  Please call and advise.  She wants to pick this up today by 1:00pm.

## 2014-06-01 NOTE — Telephone Encounter (Signed)
Rx has been resent for #90 + 5 refills rather than #905 +0 refills.  I called back.  Got no answer.  Left message.

## 2014-06-04 ENCOUNTER — Telehealth: Payer: Self-pay | Admitting: *Deleted

## 2014-06-04 NOTE — Telephone Encounter (Signed)
Spoke with Maudie Mercury and per RAS, advised her labs are ok, continue Tecfidera as rx'd.  She verbalized understanding of same/fim

## 2014-06-04 NOTE — Telephone Encounter (Signed)
-----   Message from [place]. Felecia Shelling, MD sent at 06/04/2014  4:35 PM EST ----- White blood cell count is fine...  continue tecfidera

## 2014-06-07 DIAGNOSIS — M1711 Unilateral primary osteoarthritis, right knee: Secondary | ICD-10-CM | POA: Diagnosis not present

## 2014-06-07 DIAGNOSIS — M1712 Unilateral primary osteoarthritis, left knee: Secondary | ICD-10-CM | POA: Diagnosis not present

## 2014-06-21 DIAGNOSIS — E782 Mixed hyperlipidemia: Secondary | ICD-10-CM | POA: Diagnosis not present

## 2014-06-21 DIAGNOSIS — E039 Hypothyroidism, unspecified: Secondary | ICD-10-CM | POA: Diagnosis not present

## 2014-06-21 DIAGNOSIS — K219 Gastro-esophageal reflux disease without esophagitis: Secondary | ICD-10-CM | POA: Diagnosis not present

## 2014-06-21 DIAGNOSIS — E119 Type 2 diabetes mellitus without complications: Secondary | ICD-10-CM | POA: Diagnosis not present

## 2014-06-28 DIAGNOSIS — E119 Type 2 diabetes mellitus without complications: Secondary | ICD-10-CM | POA: Diagnosis not present

## 2014-06-28 DIAGNOSIS — Z1389 Encounter for screening for other disorder: Secondary | ICD-10-CM | POA: Diagnosis not present

## 2014-06-28 DIAGNOSIS — K219 Gastro-esophageal reflux disease without esophagitis: Secondary | ICD-10-CM | POA: Diagnosis not present

## 2014-06-28 DIAGNOSIS — G35 Multiple sclerosis: Secondary | ICD-10-CM | POA: Diagnosis not present

## 2014-06-28 DIAGNOSIS — F331 Major depressive disorder, recurrent, moderate: Secondary | ICD-10-CM | POA: Diagnosis not present

## 2014-06-28 DIAGNOSIS — Z9189 Other specified personal risk factors, not elsewhere classified: Secondary | ICD-10-CM | POA: Diagnosis not present

## 2014-06-28 DIAGNOSIS — E782 Mixed hyperlipidemia: Secondary | ICD-10-CM | POA: Diagnosis not present

## 2014-06-28 DIAGNOSIS — E039 Hypothyroidism, unspecified: Secondary | ICD-10-CM | POA: Diagnosis not present

## 2014-06-28 DIAGNOSIS — E8881 Metabolic syndrome: Secondary | ICD-10-CM | POA: Diagnosis not present

## 2014-06-30 ENCOUNTER — Other Ambulatory Visit: Payer: Self-pay

## 2014-06-30 MED ORDER — DIMETHYL FUMARATE 240 MG PO CPDR: 240.0000 mg | DELAYED_RELEASE_CAPSULE | Freq: Two times a day (BID) | ORAL | Status: DC

## 2014-07-18 ENCOUNTER — Ambulatory Visit: Payer: Medicare Other | Admitting: Neurology

## 2014-07-19 ENCOUNTER — Other Ambulatory Visit: Payer: Self-pay | Admitting: Neurology

## 2014-07-19 MED ORDER — DIAZEPAM 5 MG PO TABS: 15.0000 mg | ORAL_TABLET | Freq: Every day | ORAL | Status: DC

## 2014-07-19 NOTE — Telephone Encounter (Signed)
Pt is calling stating she needs Rx for diazepam (VALIUM) 5 MG tablet. Please call and advise.

## 2014-07-23 ENCOUNTER — Telehealth: Payer: Self-pay | Admitting: Neurology

## 2014-07-23 NOTE — Telephone Encounter (Signed)
Pt is calling stating she needs a call back regarding change in medication for Tecfidera.  Please call and advise.

## 2014-07-23 NOTE — Telephone Encounter (Signed)
Spoke with Kim--she stopped Tecfidera 07-17-14--and moved her appt. from 10-02-14 to 09-03-14 to discuss restarting Tysabri/fim

## 2014-07-23 NOTE — Telephone Encounter (Signed)
Pt is calling stating that it is ok for her mother Mary Strong to pick up her written Rx.  If any questions please call and advise.

## 2014-07-23 NOTE — Telephone Encounter (Signed)
Spoke with Mary Strong and moved her appt. from 10-02-14 to 09-03-14, to discuss re-starting Tysabri/fim

## 2014-08-27 ENCOUNTER — Telehealth: Payer: Self-pay | Admitting: Neurology

## 2014-08-27 NOTE — Telephone Encounter (Signed)
Spoke with Mary Strong and advised appt. is Monday 09-03-14 at 1320.  She verbalized understanding of same--sts. she thought appt. was at the end of May--given that it is next week, she does not want to move it up/fim

## 2014-08-27 NOTE — Telephone Encounter (Signed)
Patient would like to speak with you regarding some issues she is having and wants to see if she should move her appt to an earlier time. Please call and advise.

## 2014-09-03 ENCOUNTER — Ambulatory Visit (INDEPENDENT_AMBULATORY_CARE_PROVIDER_SITE_OTHER): Payer: Medicare Other | Admitting: Neurology

## 2014-09-03 ENCOUNTER — Encounter: Payer: Self-pay | Admitting: Neurology

## 2014-09-03 VITALS — BP 118/76 | HR 94 | Resp 16 | Ht 63.0 in | Wt 215.6 lb

## 2014-09-03 DIAGNOSIS — G801 Spastic diplegic cerebral palsy: Secondary | ICD-10-CM

## 2014-09-03 DIAGNOSIS — F329 Major depressive disorder, single episode, unspecified: Secondary | ICD-10-CM

## 2014-09-03 DIAGNOSIS — R7989 Other specified abnormal findings of blood chemistry: Secondary | ICD-10-CM | POA: Diagnosis not present

## 2014-09-03 DIAGNOSIS — Z79899 Other long term (current) drug therapy: Secondary | ICD-10-CM | POA: Insufficient documentation

## 2014-09-03 DIAGNOSIS — F32A Depression, unspecified: Secondary | ICD-10-CM

## 2014-09-03 DIAGNOSIS — R208 Other disturbances of skin sensation: Secondary | ICD-10-CM | POA: Diagnosis not present

## 2014-09-03 DIAGNOSIS — G4700 Insomnia, unspecified: Secondary | ICD-10-CM | POA: Diagnosis not present

## 2014-09-03 DIAGNOSIS — G35 Multiple sclerosis: Secondary | ICD-10-CM | POA: Diagnosis not present

## 2014-09-03 DIAGNOSIS — R5382 Chronic fatigue, unspecified: Secondary | ICD-10-CM

## 2014-09-03 DIAGNOSIS — E559 Vitamin D deficiency, unspecified: Secondary | ICD-10-CM

## 2014-09-03 MED ORDER — FENTANYL 25 MCG/HR TD PT72
25.0000 ug | MEDICATED_PATCH | TRANSDERMAL | Status: DC
Start: 2014-09-03 — End: 2014-09-20

## 2014-09-03 NOTE — Progress Notes (Signed)
[facility]  PATIENT: Mary Strong DOB: 01/26/1961  REFERRING CLINICIAN: Gar Ponto  HISTORY FROM: patient REASON FOR VISIT: MS and pain   HISTORICAL  CHIEF COMPLAINT:  Chief Complaint  Patient presents with  . Multiple Sclerosis    Sts. she stopped Tecfidera 6 weeks ago--she just didn't feel as well on it, had some nausea, no vomiting.  She would like to discuss restarting Tysabri/fim    HISTORY OF PRESENT ILLNESS:  Mary Strong is a 54 yo woman who was diagnosed with multiple sclerosis in 2005 after presenting with least gait, muscle spasms and pain. SHe was found to have a transverse myelitis. Initially, she was placed on Avonex. Due to worsening symptoms, she was switched to Tysabri around 2010 or 2011 and had 52 doses. However, she had converted to JCV antibody positive status and her titer rose over the past couple of determinations. Because of that, the Tysabri was discontinued as she had a higher risk of PML and she was started on Tecfidera a few months ago. She did not feel as good on Tecfidera as she felt on Tysabri.   In the past, we had discussed restarting Tysabri after 6 months of Tecfidera -- she stopped Tecfidera 4-6 weeks ago.      Gait:  She notes continued difficulty with her gait.   She  notes that the left leg gives out more than the right. There is leg spasticity with painful spasms as well. Spasticity is not a problem in the hands and arms.  Pain/sensation:   She notes a lot of pain in the legs and this has worsened over the last month. Pain is described as stabbing and viselike. Motrin has not helped. She is on lamotrigine for dysesthetic pain but that has not helped this pain much.  Lamotrigine has helped somewhat the pain she was having in her feet but not the more severe pain she is having higher up in the legs.   The worst pain is in the thighs and the knee area. She is also on duloxetine that sometimes helps pain.    He also is  noticing more spasticity with more pain in the legs. She takes diazepam, 15 mg at bedtime. That no longer is helping her sleep as it did in the past.  She has less painful tingling in the hands and sometimes her fingers draw up a bit.    She also has dysesthesias in the perineum as well with an itching warm sensation that is not helped by current med's.  Fatigue/Sleep:   She notes that she is having more fatigue the past year. Additionally she notes that she sleeps poorly and has slept worse the past few months.    She has both sleep onset insomnia and sleep maintenance insomnia, depsit taking 15 mg valium some nights. Also he seemed to be worse due to discomfort.   Mood:   She is better than earlier this year but still notes depression.   She is on duloxetine 60 mg daily.       Other:   She showed me lab tests from 2/19/20916:   Glucose is 200.  HgbA1c was 6.7.     REVIEW OF SYSTEMS:  Constitutional: No fevers, chills, sweats, or change in appetite.  She has fatigue Eyes: No visual changes, double vision, eye pain Ear, nose and throat: No hearing loss, ear pain, nasal congestion, sore throat Cardiovascular: No chest pain, palpitations Respiratory:  No shortness of breath at rest  or with exertion.   No wheezes GastrointestinaI: No nausea, vomiting, diarrhea, abdominal pain, fecal incontinence Genitourinary:  No dysuria, urinary retention or frequency.  No nocturia. Musculoskeletal:  She notes pain in back, hips, legs Integumentary: No rash, pruritus, skin lesions Neurological: as above Psychiatric: She has depression and mild anxiety Endocrine: No palpitations, diaphoresis, change in appetite, change in weigh or increased thirst Hematologic/Lymphatic:  No anemia, purpura, petechiae. Allergic/Immunologic: No itchy/runny eyes, nasal congestion, recent allergic reactions, rashes  ALLERGIES: Allergies  Allergen Reactions  . Shellfish Allergy Anaphylaxis  . Copaxone [Glatiramer Acetate] Hives    . Mycinaire Nasal Mist [Sodium Chloride]   . Penicillins Hives  . Vancomycin Itching    HOME MEDICATIONS:   Current outpatient prescriptions:  .  Biotin 5000 MCG CAPS, Take by mouth daily., Disp: , Rfl:  .  diazepam (VALIUM) 5 MG tablet, Take 3 tablets (15 mg total) by mouth at bedtime., Disp: 90 tablet, Rfl: 5 .  DULoxetine (CYMBALTA) 60 MG capsule, Take 60 mg by mouth at bedtime., Disp: , Rfl:  .  ibuprofen (ADVIL,MOTRIN) 800 MG tablet, Take 800 mg by mouth 2 (two) times daily., Disp: , Rfl:  .  lamoTRIgine (LAMICTAL) 200 MG tablet, Take 200 mg by mouth 2 (two) times daily., Disp: , Rfl:  .  levothyroxine (SYNTHROID, LEVOTHROID) 137 MCG tablet, Take 137 mcg by mouth daily., Disp: , Rfl:  .  lisinopril-hydrochlorothiazide (PRINZIDE,ZESTORETIC) 20-12.5 MG per tablet, daily., Disp: , Rfl:  .  mometasone (ELOCON) 0.1 % cream, , Disp: , Rfl:  .  omeprazole (PRILOSEC) 20 MG capsule, Take 20 mg by mouth daily., Disp: , Rfl:  .  tiZANidine (ZANAFLEX) 4 MG tablet, Take 1 tablet (4 mg total) by mouth 3 (three) times daily., Disp: 90 tablet, Rfl: 5 .  Dimethyl Fumarate 240 MG CPDR, Take 1 capsule (240 mg total) by mouth 2 (two) times daily. (Patient not taking: Reported on 09/03/2014), Disp: 60 capsule, Rfl: 11   PAST MEDICAL HISTORY: Past Medical History  Diagnosis Date  . Palpitations   . Diabetes mellitus without complication   . Multiple sclerosis   . Movement disorder   . Hypertension   . Vision abnormalities   . Neuropathy     PAST SURGICAL HISTORY: Past Surgical History  Procedure Laterality Date  . Cholecystectomy    . Cesarean section    . Tonsillectomy      FAMILY HISTORY: Family History  Problem Relation Age of Onset  . Multiple sclerosis Mother   . Arthritis/Rheumatoid Mother   . Psoriasis Father     SOCIAL HISTORY:  History   Social History  . Marital Status: Married    Spouse Name: N/A  . Number of Children: N/A  . Years of Education: N/A    Occupational History  . Not on file.   Social History Main Topics  . Smoking status: Never Smoker   . Smokeless tobacco: Not on file  . Alcohol Use: 0.0 oz/week    0 Standard drinks or equivalent per week     Comment: Rarely  . Drug Use: Yes     Comment: occasional marijuana  . Sexual Activity: Not on file   Other Topics Concern  . Not on file   Social History Narrative     PHYSICAL EXAM  Filed Vitals:   09/03/14 1313  BP: 118/76  Pulse: 94  Resp: 16  Height: 5\' 3"  (1.6 m)  Weight: 215 lb 9.6 oz (97.796 kg)    Body mass index  is 38.2 kg/(m^2).   General: The patient is well-developed and well-nourished and in no acute distress   Neck: The neck is supple, no carotid bruits are noted.  The neck is nontender.  Respiratory: The respiratory examination is clear.  Cardiovascular: The cardiovascular examination reveals a regular rate and rhythm, no murmurs, gallops or rubs are noted.  Skin: Extremities are without significant edema.  Musculoskeletal:  Tender over lower back and buttocks  Neurologic Exam  Mental status: The patient is alert and oriented x 3 at the time of the examination. The patient has apparent normal recent and remote memory, with an apparently normal attention span and concentration ability.   Speech is normal.  Mood:  She is tearful initially and expresses frustration with her pain.    Cranial nerves: Extraocular movements are full. Pupils are equal, round, and reactive to light and accomodation.  Visual fields are full.  Facial symmetry is present. There is good facial sensation to soft touch bilaterally.Facial strength is normal.  Trapezius and sternocleidomastoid strength is normal. No dysarthria is noted.  The tongue is midline, and the patient has symmetric elevation of the soft palate. No obvious hearing deficits are noted.  Motor:  Muscle bulk is normal and tone is mildly increased in legs.   Strength is  5 / 5 in all 4 extremities.    Sensory: Sensory testing is intact to pinprick, soft touch, vibration sensation in arms but she reports decreased sensation in left leg to these modalities.     Coordination: Cerebellar testing reveals good finger-nose-finger and mildly reduced  heel-to-shin bilaterally.  Gait and station: Station is normal and gait is arthritic and wide.   Tandem gait is wide. Romberg is negative.   Reflexes: Deep tendon reflexes are symmetric and 3+ bilaterally.      DIAGNOSTIC DATA (LABS, IMAGING, TESTING) - I reviewed patient records, labs, notes, testing and imaging myself where available.      ASSESSMENT AND PLAN  Multiple sclerosis - Plan: Stratify JCV Antibody Test ([facility]), Vit D  25 hydroxy (rtn osteoporosis monitoring)  Spastic diplegia  Dysesthesia  Depression  High risk medication use - Plan: Stratify JCV Antibody Test ([facility])  Chronic fatigue  Insomnia  Low serum vitamin D - Plan: Vit D  25 hydroxy (rtn osteoporosis monitoring)  Vitamin D deficiency - Plan: Vit D  25 hydroxy (rtn osteoporosis monitoring)    1.   Change to Tysabri 2   Check JCV Ab, Vit d 3.  Fentanyl 25 mcg, we discussed that due to night time valium, I am reluctant to increase to a higher dose 4.   Change Lamotrigine to 200 bid (she is taking all at once, needs to split)  In summary, Mary Strong is a 54 yo woman with an episode of transverse myelitis who was subsequently diagnosed with MS.   She switched toTecfidera recently and tolerates it well.   She has been much worse with more pain in her legs.   This seems to be a combination of dysesthetic pain and muscle spasticity.    She is sleeping poorly.  To try to help the pain, spasticity and sleep, I'll start tizanidine and have her titrate up to 4 mg po tid.  We can increase if well tolerated.   She is advised to stay active.   MRI's have been fairly stable over the past few years.  She will return in 4 months, sooner if new or worsening neurologic  symptoms.  Tijuan Dantes A. Felecia Shelling, MD, PhD 12/09/2759, 8:48 PM Certified in Neurology, Clinical Neurophysiology, Sleep Medicine, Pain Medicine and Neuroimaging  [facility] [address] [place], [place] [address] [phone]

## 2014-09-04 LAB — VITAMIN D 25 HYDROXY (VIT D DEFICIENCY, FRACTURES): Vit D, 25-Hydroxy: 35.9 ng/mL (ref 30.0–100.0)

## 2014-09-07 ENCOUNTER — Encounter: Payer: Self-pay | Admitting: *Deleted

## 2014-09-10 ENCOUNTER — Encounter: Payer: Self-pay | Admitting: *Deleted

## 2014-09-20 ENCOUNTER — Encounter: Payer: Self-pay | Admitting: *Deleted

## 2014-09-20 ENCOUNTER — Other Ambulatory Visit: Payer: Self-pay | Admitting: *Deleted

## 2014-09-20 DIAGNOSIS — G35 Multiple sclerosis: Secondary | ICD-10-CM | POA: Diagnosis not present

## 2014-09-20 MED ORDER — FENTANYL 25 MCG/HR TD PT72: 25.0000 ug | MEDICATED_PATCH | TRANSDERMAL | Status: DC

## 2014-09-20 NOTE — Telephone Encounter (Signed)
Fentanyl r/f at appt. for Tysabri infusion today/fim

## 2014-09-25 DIAGNOSIS — Z9189 Other specified personal risk factors, not elsewhere classified: Secondary | ICD-10-CM | POA: Diagnosis not present

## 2014-09-25 DIAGNOSIS — E8881 Metabolic syndrome: Secondary | ICD-10-CM | POA: Diagnosis not present

## 2014-09-25 DIAGNOSIS — E119 Type 2 diabetes mellitus without complications: Secondary | ICD-10-CM | POA: Diagnosis not present

## 2014-09-25 DIAGNOSIS — E782 Mixed hyperlipidemia: Secondary | ICD-10-CM | POA: Diagnosis not present

## 2014-09-25 DIAGNOSIS — E039 Hypothyroidism, unspecified: Secondary | ICD-10-CM | POA: Diagnosis not present

## 2014-09-25 DIAGNOSIS — D649 Anemia, unspecified: Secondary | ICD-10-CM | POA: Diagnosis not present

## 2014-09-27 ENCOUNTER — Telehealth: Payer: Self-pay | Admitting: Neurology

## 2014-09-27 NOTE — Telephone Encounter (Signed)
I lmom (identified vm), letting Mary Strong know her last vit. d level was 35.9/fim

## 2014-09-27 NOTE — Telephone Encounter (Signed)
Patient called requesting to know her Vitamin D levels. She states she wants to let her PCP know, she has an appointment with him on 10/02/14. Please call and advise. Patient can be reached at [phone]. Can leave message on voice mail.

## 2014-10-02 ENCOUNTER — Ambulatory Visit: Payer: Medicare Other | Admitting: Neurology

## 2014-10-02 DIAGNOSIS — F331 Major depressive disorder, recurrent, moderate: Secondary | ICD-10-CM | POA: Diagnosis not present

## 2014-10-02 DIAGNOSIS — E8881 Metabolic syndrome: Secondary | ICD-10-CM | POA: Diagnosis not present

## 2014-10-02 DIAGNOSIS — G35 Multiple sclerosis: Secondary | ICD-10-CM | POA: Diagnosis not present

## 2014-10-02 DIAGNOSIS — E782 Mixed hyperlipidemia: Secondary | ICD-10-CM | POA: Diagnosis not present

## 2014-10-02 DIAGNOSIS — E039 Hypothyroidism, unspecified: Secondary | ICD-10-CM | POA: Diagnosis not present

## 2014-10-02 DIAGNOSIS — K219 Gastro-esophageal reflux disease without esophagitis: Secondary | ICD-10-CM | POA: Diagnosis not present

## 2014-10-02 DIAGNOSIS — E119 Type 2 diabetes mellitus without complications: Secondary | ICD-10-CM | POA: Diagnosis not present

## 2014-10-08 ENCOUNTER — Encounter: Payer: Self-pay | Admitting: *Deleted

## 2014-10-15 DIAGNOSIS — K219 Gastro-esophageal reflux disease without esophagitis: Secondary | ICD-10-CM | POA: Diagnosis not present

## 2014-10-15 DIAGNOSIS — E119 Type 2 diabetes mellitus without complications: Secondary | ICD-10-CM | POA: Diagnosis not present

## 2014-10-15 DIAGNOSIS — E039 Hypothyroidism, unspecified: Secondary | ICD-10-CM | POA: Diagnosis not present

## 2014-10-15 DIAGNOSIS — E782 Mixed hyperlipidemia: Secondary | ICD-10-CM | POA: Diagnosis not present

## 2014-10-15 DIAGNOSIS — E8881 Metabolic syndrome: Secondary | ICD-10-CM | POA: Diagnosis not present

## 2014-10-15 DIAGNOSIS — R609 Edema, unspecified: Secondary | ICD-10-CM | POA: Diagnosis not present

## 2014-10-15 DIAGNOSIS — G35 Multiple sclerosis: Secondary | ICD-10-CM | POA: Diagnosis not present

## 2014-10-18 ENCOUNTER — Other Ambulatory Visit: Payer: Self-pay | Admitting: *Deleted

## 2014-10-18 DIAGNOSIS — G35 Multiple sclerosis: Secondary | ICD-10-CM | POA: Diagnosis not present

## 2014-10-18 NOTE — Telephone Encounter (Signed)
Fentanyl r/f at infuison appt. (tysabri)/fim

## 2014-10-18 NOTE — Telephone Encounter (Signed)
encounter opened in error/fim

## 2014-10-23 ENCOUNTER — Telehealth: Payer: Self-pay | Admitting: Neurology

## 2014-10-23 NOTE — Telephone Encounter (Signed)
Patient is calling regarding Fentanyl pain patch.  She has one patch left for tomorrow.  Please call

## 2014-10-23 NOTE — Telephone Encounter (Signed)
Patient called stating she does not need a refill at this time. She found 3 patches.

## 2014-10-23 NOTE — Telephone Encounter (Signed)
It appears Fentanyl was last written on 05/9, with a note "do not fill before 06/02".  I called the pharmacy.  Spoke with Erline Levine, who verified they last filled this Rx on 06/02.  Appears it would be too soon to refill at this time.  I called the patient back to clarify.  Got no answer.  Left message.

## 2014-11-06 ENCOUNTER — Other Ambulatory Visit: Payer: Self-pay | Admitting: Neurology

## 2014-11-06 MED ORDER — FENTANYL 25 MCG/HR TD PT72: 25.0000 ug | MEDICATED_PATCH | TRANSDERMAL | Status: DC

## 2014-11-06 NOTE — Telephone Encounter (Signed)
Request entered, forwarded to provider for approval.  

## 2014-11-06 NOTE — Telephone Encounter (Signed)
Patient called requesting refill for  fentaNYL (DURAGESIC) 25 MCG/HR patch . Patient advised RX will be ready within 24hr unless otherwise informed by RN.  Patient can be reached at [phone].

## 2014-11-07 ENCOUNTER — Encounter: Payer: Self-pay | Admitting: *Deleted

## 2014-11-07 NOTE — Progress Notes (Signed)
Fentanyl rx. up front [facility]/fim 

## 2014-11-15 ENCOUNTER — Other Ambulatory Visit: Payer: Self-pay | Admitting: *Deleted

## 2014-11-15 ENCOUNTER — Encounter: Payer: Self-pay | Admitting: *Deleted

## 2014-11-15 DIAGNOSIS — G35 Multiple sclerosis: Secondary | ICD-10-CM | POA: Diagnosis not present

## 2014-11-15 MED ORDER — FENTANYL 25 MCG/HR TD PT72: 25.0000 ug | MEDICATED_PATCH | TRANSDERMAL | Status: DC

## 2014-11-15 NOTE — Telephone Encounter (Signed)
Post-dated rx. for Fentanyl provided at Tysabri appt/fim

## 2014-11-20 DIAGNOSIS — G35 Multiple sclerosis: Secondary | ICD-10-CM | POA: Diagnosis not present

## 2014-11-20 DIAGNOSIS — E119 Type 2 diabetes mellitus without complications: Secondary | ICD-10-CM | POA: Diagnosis not present

## 2014-11-20 DIAGNOSIS — K219 Gastro-esophageal reflux disease without esophagitis: Secondary | ICD-10-CM | POA: Diagnosis not present

## 2014-11-20 DIAGNOSIS — R1013 Epigastric pain: Secondary | ICD-10-CM | POA: Diagnosis not present

## 2014-11-20 DIAGNOSIS — E039 Hypothyroidism, unspecified: Secondary | ICD-10-CM | POA: Diagnosis not present

## 2014-11-20 DIAGNOSIS — E782 Mixed hyperlipidemia: Secondary | ICD-10-CM | POA: Diagnosis not present

## 2014-11-20 DIAGNOSIS — E8881 Metabolic syndrome: Secondary | ICD-10-CM | POA: Diagnosis not present

## 2014-11-27 DIAGNOSIS — R110 Nausea: Secondary | ICD-10-CM | POA: Diagnosis not present

## 2014-11-27 DIAGNOSIS — R1013 Epigastric pain: Secondary | ICD-10-CM | POA: Diagnosis not present

## 2014-11-27 DIAGNOSIS — K760 Fatty (change of) liver, not elsewhere classified: Secondary | ICD-10-CM | POA: Diagnosis not present

## 2014-11-27 DIAGNOSIS — R197 Diarrhea, unspecified: Secondary | ICD-10-CM | POA: Diagnosis not present

## 2014-11-27 DIAGNOSIS — R1012 Left upper quadrant pain: Secondary | ICD-10-CM | POA: Diagnosis not present

## 2014-12-12 ENCOUNTER — Other Ambulatory Visit: Payer: Self-pay | Admitting: *Deleted

## 2014-12-12 MED ORDER — FENTANYL 25 MCG/HR TD PT72: 25.0000 ug | MEDICATED_PATCH | TRANSDERMAL | Status: DC

## 2014-12-12 NOTE — Telephone Encounter (Signed)
Rx. prepared for pt. to pick up at infusion appt. on 12-13-14.  Rx. up front [facility]/fim

## 2014-12-13 DIAGNOSIS — G35 Multiple sclerosis: Secondary | ICD-10-CM | POA: Diagnosis not present

## 2015-01-01 DIAGNOSIS — M25511 Pain in right shoulder: Secondary | ICD-10-CM | POA: Diagnosis not present

## 2015-01-08 ENCOUNTER — Encounter: Payer: Self-pay | Admitting: Neurology

## 2015-01-08 ENCOUNTER — Ambulatory Visit (INDEPENDENT_AMBULATORY_CARE_PROVIDER_SITE_OTHER): Payer: Medicare Other | Admitting: Neurology

## 2015-01-08 VITALS — BP 106/70 | HR 68 | Resp 14 | Ht 63.0 in | Wt 189.8 lb

## 2015-01-08 DIAGNOSIS — F329 Major depressive disorder, single episode, unspecified: Secondary | ICD-10-CM

## 2015-01-08 DIAGNOSIS — R5382 Chronic fatigue, unspecified: Secondary | ICD-10-CM | POA: Diagnosis not present

## 2015-01-08 DIAGNOSIS — G801 Spastic diplegic cerebral palsy: Secondary | ICD-10-CM

## 2015-01-08 DIAGNOSIS — G35 Multiple sclerosis: Secondary | ICD-10-CM | POA: Diagnosis not present

## 2015-01-08 DIAGNOSIS — G4700 Insomnia, unspecified: Secondary | ICD-10-CM

## 2015-01-08 DIAGNOSIS — R208 Other disturbances of skin sensation: Secondary | ICD-10-CM | POA: Diagnosis not present

## 2015-01-08 DIAGNOSIS — Z79899 Other long term (current) drug therapy: Secondary | ICD-10-CM

## 2015-01-08 DIAGNOSIS — F32A Depression, unspecified: Secondary | ICD-10-CM

## 2015-01-08 MED ORDER — LAMOTRIGINE 200 MG PO TABS: 200.0000 mg | ORAL_TABLET | Freq: Two times a day (BID) | ORAL | Status: DC

## 2015-01-08 MED ORDER — FENTANYL 25 MCG/HR TD PT72: 25.0000 ug | MEDICATED_PATCH | TRANSDERMAL | Status: DC

## 2015-01-08 MED ORDER — DIAZEPAM 5 MG PO TABS: 15.0000 mg | ORAL_TABLET | Freq: Every day | ORAL | Status: DC

## 2015-01-08 NOTE — Progress Notes (Signed)
[facility]  PATIENT: Mary Strong DOB: 05-21-1960  REFERRING CLINICIAN: Gar Ponto  HISTORY FROM: patient REASON FOR VISIT: MS and pain   HISTORICAL  CHIEF COMPLAINT:  Chief Complaint  Patient presents with  . Multiple Sclerosis    Sts. she tolerates Tysabri well.  Sts. depression is worse. Sts. she is also having difficulty with anger management.  Sts. about 6 weeks ago she had some right leg numbness--thinks due to this, she fell getting out of her boyfriend's truck.  Sts. she landed on her right side--about 2 weeks later she began having some right shoulder pain.  Sts. she saw her pcp--x-rays were negative, so he gave po nsaids which helped at first but not as much now.  Sts. pcp has offered emg/ncv, but she prefers to wait.    . Numbness    Sts. she has lost about 40lbs in the last 3 mos., without trying.  Sts. she has a strong familiy hx. of cancer, but so far all testing has not revealed any malignancy.  Sts. she thinks she has IBS/fim    HISTORY OF PRESENT ILLNESS:  [name] is a 54 yo woman with MS.     She is on Tysabri therapy.   She usually tolerates it well but BP dropped last visit and she needed IV fluods at the last visit --- no rash or other allergic like symptoms.      Gait:  She notes difficulty with her gait and had a couple falls, once landing on her right shoulder.   Xray was fine and an NSAID was started.   Pain improed at first but now is bothersome again.  While walking, she  notes that the left leg gives out more than the right, but some falls have been tripping with her right.    She also has more spasticity with more pain in the legs. She takes diazepam, 15 mg at bedtime which helps the spasticity.   Pain/sensation:   She reports a lot of leg pain and only gets partial benefit form fentanyl.   She also has a dysesthetic pain.   She is on lamotrigine for dysesthetic pain.    She is also on duloxetine that sometimes helps pain.    She has less painful tingling in the hands and sometimes her fingers draw up a bit.  Spasticity is also painful at times.     She also has dysesthesias in the perineum as well with an itching warm sensation that is not helped by current med's.  Fatigue/Sleep:   She notes a lot of fatigue the past year. Additionally she notes that she sleeps poorly and has slept worse the past few months.    She has both sleep onset insomnia and sleep maintenance insomnia, ans she gets some benefit from valium and also takes Benadryl.   Mood:   She is noting more depression again despite duloxetine 60 mg daily.     She has some crying spells.   She gets some agitiation at times.    Other:   She stopped metformin due to bowel side effects and sugars still high despite weight loss.      MS History:   In 2005, she presented with least gait, muscle spasms and pain. SHe was found to have a transverse myelitis. Initially, she was placed on Avonex. Due to worsening symptoms, she was switched to Tysabri around 2010 or 2011 and had 52 doses. However, she had converted to JCV antibody  positive status and her titer rose over the past couple of determinations. Because of that, the Tysabri was discontinued as she had a higher risk of PML and she was started on Tecfidera a few months ago. She did not feel as good on Tecfidera as she felt on Tysabri.   In the past, we had discussed restarting Tysabri after 6 months of Tecfidera -- she stopped Tecfidera 4-6 weeks ago.       REVIEW OF SYSTEMS:  Constitutional: No fevers, chills, sweats, or change in appetite.  She has fatigue Eyes: No visual changes, double vision, eye pain Ear, nose and throat: No hearing loss, ear pain, nasal congestion, sore throat Cardiovascular: No chest pain, palpitations Respiratory:  No shortness of breath at rest or with exertion.   No wheezes GastrointestinaI: No nausea, vomiting, diarrhea, abdominal pain, fecal incontinence Genitourinary:  No dysuria,  urinary retention or frequency.  No nocturia. Musculoskeletal:  She notes pain in back, hips, legs Integumentary: No rash, pruritus, skin lesions Neurological: as above Psychiatric: She has depression and mild anxiety Endocrine: No palpitations, diaphoresis, change in appetite, change in weigh or increased thirst Hematologic/Lymphatic:  No anemia, purpura, petechiae. Allergic/Immunologic: No itchy/runny eyes, nasal congestion, recent allergic reactions, rashes  ALLERGIES: Allergies  Allergen Reactions  . Shellfish Allergy Anaphylaxis  . Copaxone [Glatiramer Acetate] Hives  . Mycinaire Nasal Mist [Sodium Chloride]   . Penicillins Hives  . Vancomycin Itching    HOME MEDICATIONS:   Current outpatient prescriptions:  .  Biotin 5000 MCG CAPS, Take by mouth daily., Disp: , Rfl:  .  diazepam (VALIUM) 5 MG tablet, Take 3 tablets (15 mg total) by mouth at bedtime., Disp: 90 tablet, Rfl: 5 .  DULoxetine (CYMBALTA) 60 MG capsule, Take 60 mg by mouth at bedtime., Disp: , Rfl:  .  fentaNYL (DURAGESIC) 25 MCG/HR patch, Place 1 patch (25 mcg total) onto the skin every 3 (three) days., Disp: 10 patch, Rfl: 0 .  ibuprofen (ADVIL,MOTRIN) 800 MG tablet, Take 800 mg by mouth 2 (two) times daily., Disp: , Rfl:  .  lamoTRIgine (LAMICTAL) 200 MG tablet, Take 200 mg by mouth 2 (two) times daily., Disp: , Rfl:  .  levothyroxine (SYNTHROID, LEVOTHROID) 137 MCG tablet, Take 137 mcg by mouth daily., Disp: , Rfl:  .  lisinopril-hydrochlorothiazide (PRINZIDE,ZESTORETIC) 20-12.5 MG per tablet, daily., Disp: , Rfl:  .  mometasone (ELOCON) 0.1 % cream, , Disp: , Rfl:  .  natalizumab (TYSABRI) 300 MG/15ML injection, Inject 300 mg into the vein every 30 (thirty) days., Disp: , Rfl:  .  omeprazole (PRILOSEC) 20 MG capsule, Take 20 mg by mouth daily., Disp: , Rfl:  .  meloxicam (MOBIC) 15 MG tablet, , Disp: , Rfl:  .  tiZANidine (ZANAFLEX) 4 MG tablet, Take 1 tablet (4 mg total) by mouth 3 (three) times daily.  (Patient not taking: Reported on 01/08/2015), Disp: 90 tablet, Rfl: 5   PAST MEDICAL HISTORY: Past Medical History  Diagnosis Date  . Palpitations   . Diabetes mellitus without complication   . Multiple sclerosis   . Movement disorder   . Hypertension   . Vision abnormalities   . Neuropathy     PAST SURGICAL HISTORY: Past Surgical History  Procedure Laterality Date  . Cholecystectomy    . Cesarean section    . Tonsillectomy      FAMILY HISTORY: Family History  Problem Relation Age of Onset  . Multiple sclerosis Mother   . Arthritis/Rheumatoid Mother   . Psoriasis Father  SOCIAL HISTORY:  Social History   Social History  . Marital Status: Married    Spouse Name: N/A  . Number of Children: N/A  . Years of Education: N/A   Occupational History  . Not on file.   Social History Main Topics  . Smoking status: Never Smoker   . Smokeless tobacco: Not on file  . Alcohol Use: 0.0 oz/week    0 Standard drinks or equivalent per week     Comment: Rarely  . Drug Use: Yes     Comment: occasional marijuana  . Sexual Activity: Not on file   Other Topics Concern  . Not on file   Social History Narrative     PHYSICAL EXAM  Filed Vitals:   01/08/15 1311  BP: 106/70  Pulse: 68  Resp: 14  Height: 5\' 3"  (1.6 m)  Weight: 189 lb 12.8 oz (86.093 kg)    Body mass index is 33.63 kg/(m^2).   General: The patient is well-developed and well-nourished and in no acute distress  Musculoskeletal:  Tender over lower back and buttocks. Left > right.  Also tender over right shoulder  Neurologic Exam  Mental status: The patient is alert and oriented x 3 at the time of the examination. The patient has apparent normal recent and remote memory, with an apparently normal attention span and concentration ability.   Speech is normal.  Mood:  She is tearful initially and expresses frustration with her pain.    Cranial nerves: Extraocular movements are full. Pupils are equal,  round, and reactive to light and accomodation.   Facial symmetry is present. There is good facial sensation to soft touch bilaterally.Facial strength is normal.  Trapezius and sternocleidomastoid strength is normal. No dysarthria is noted.    No obvious hearing deficits are noted.  Motor:  Muscle bulk is normal and tone is mildly increased in legs.   Strength is  5 / 5 in all 4 extremities.   Sensory: Sensory testing is intact to pinprick, soft touch, vibration sensation in arms but she reports decreased sensation in left leg to these modalities.     Coordination: Cerebellar testing reveals good finger-nose-finger   Gait and station: Station is normal and gait is arthritic and wide.   Tandem gait is wide. Romberg is negative.   Reflexes: Deep tendon reflexes are symmetric and 3+ bilaterally.      DIAGNOSTIC DATA (LABS, IMAGING, TESTING) - I reviewed patient records, labs, notes, testing and imaging myself where available.      ASSESSMENT AND PLAN  Multiple sclerosis  Spastic diplegia  Chronic fatigue  Depression  Dysesthesia  High risk medication use  Insomnia   1.   Continiue Tysabri 2   Consider risperidone or Abilify if her agitated depression worsens area also consider referral to psychiatry. 3.  Fentanyl 25 mcg, we discussed that due to night time valium, I am reluctant to increase to a higher dose 4.   Continue Lamotrigine 200 bid  She will return in 4 months, sooner if new or worsening neurologic symptoms.      Richard A. Felecia Shelling, MD, PhD 08/06/8097, 8:33 PM Certified in Neurology, Clinical Neurophysiology, Sleep Medicine, Pain Medicine and Neuroimaging  [facility] [address], [place], [place] [address] [phone]

## 2015-01-10 ENCOUNTER — Encounter: Payer: Self-pay | Admitting: *Deleted

## 2015-01-10 DIAGNOSIS — G35 Multiple sclerosis: Secondary | ICD-10-CM | POA: Diagnosis not present

## 2015-01-14 ENCOUNTER — Encounter: Payer: Self-pay | Admitting: *Deleted

## 2015-01-17 DIAGNOSIS — Z23 Encounter for immunization: Secondary | ICD-10-CM | POA: Diagnosis not present

## 2015-01-24 DIAGNOSIS — E782 Mixed hyperlipidemia: Secondary | ICD-10-CM | POA: Diagnosis not present

## 2015-01-24 DIAGNOSIS — D649 Anemia, unspecified: Secondary | ICD-10-CM | POA: Diagnosis not present

## 2015-01-24 DIAGNOSIS — D519 Vitamin B12 deficiency anemia, unspecified: Secondary | ICD-10-CM | POA: Diagnosis not present

## 2015-01-24 DIAGNOSIS — E119 Type 2 diabetes mellitus without complications: Secondary | ICD-10-CM | POA: Diagnosis not present

## 2015-01-24 DIAGNOSIS — E039 Hypothyroidism, unspecified: Secondary | ICD-10-CM | POA: Diagnosis not present

## 2015-01-24 DIAGNOSIS — K219 Gastro-esophageal reflux disease without esophagitis: Secondary | ICD-10-CM | POA: Diagnosis not present

## 2015-01-31 DIAGNOSIS — F331 Major depressive disorder, recurrent, moderate: Secondary | ICD-10-CM | POA: Diagnosis not present

## 2015-01-31 DIAGNOSIS — G35 Multiple sclerosis: Secondary | ICD-10-CM | POA: Diagnosis not present

## 2015-01-31 DIAGNOSIS — E6609 Other obesity due to excess calories: Secondary | ICD-10-CM | POA: Diagnosis not present

## 2015-01-31 DIAGNOSIS — S46011A Strain of muscle(s) and tendon(s) of the rotator cuff of right shoulder, initial encounter: Secondary | ICD-10-CM | POA: Diagnosis not present

## 2015-01-31 DIAGNOSIS — K219 Gastro-esophageal reflux disease without esophagitis: Secondary | ICD-10-CM | POA: Diagnosis not present

## 2015-01-31 DIAGNOSIS — E782 Mixed hyperlipidemia: Secondary | ICD-10-CM | POA: Diagnosis not present

## 2015-01-31 DIAGNOSIS — E8881 Metabolic syndrome: Secondary | ICD-10-CM | POA: Diagnosis not present

## 2015-02-11 ENCOUNTER — Other Ambulatory Visit: Payer: Self-pay | Admitting: *Deleted

## 2015-02-11 MED ORDER — FENTANYL 25 MCG/HR TD PT72: 25.0000 ug | MEDICATED_PATCH | TRANSDERMAL | Status: DC

## 2015-02-11 NOTE — Telephone Encounter (Signed)
Pt. coming in for Tysabri infusion tomorrow.  RAS will be out of the office. Rx. prepared for pt. to pick up tomorrow--printed, signed, up front [facility]/fim

## 2015-02-12 ENCOUNTER — Encounter: Payer: Self-pay | Admitting: *Deleted

## 2015-02-12 ENCOUNTER — Other Ambulatory Visit (INDEPENDENT_AMBULATORY_CARE_PROVIDER_SITE_OTHER): Payer: Self-pay

## 2015-02-12 ENCOUNTER — Other Ambulatory Visit: Payer: Self-pay | Admitting: *Deleted

## 2015-02-12 DIAGNOSIS — G35 Multiple sclerosis: Secondary | ICD-10-CM

## 2015-02-12 DIAGNOSIS — Z0289 Encounter for other administrative examinations: Secondary | ICD-10-CM

## 2015-02-12 DIAGNOSIS — Z79899 Other long term (current) drug therapy: Secondary | ICD-10-CM | POA: Diagnosis not present

## 2015-02-13 LAB — HEPATIC FUNCTION PANEL
ALK PHOS: 62 IU/L (ref 39–117)
ALT: 26 IU/L (ref 0–32)
AST: 24 IU/L (ref 0–40)
Albumin: 3.9 g/dL (ref 3.5–5.5)
BILIRUBIN, DIRECT: 0.09 mg/dL (ref 0.00–0.40)
Bilirubin Total: 0.3 mg/dL (ref 0.0–1.2)
TOTAL PROTEIN: 5.8 g/dL — AB (ref 6.0–8.5)

## 2015-02-14 DIAGNOSIS — M6281 Muscle weakness (generalized): Secondary | ICD-10-CM | POA: Diagnosis not present

## 2015-02-14 DIAGNOSIS — M25511 Pain in right shoulder: Secondary | ICD-10-CM | POA: Diagnosis not present

## 2015-02-18 ENCOUNTER — Telehealth: Payer: Self-pay | Admitting: *Deleted

## 2015-02-18 NOTE — Telephone Encounter (Signed)
LMOM (identified vm) that lft's were normal.  She does not need to return this call unless she has questions/fim

## 2015-02-18 NOTE — Telephone Encounter (Signed)
-----   Message from Britt Bottom, MD sent at 02/17/2015  8:57 AM EDT ----- Please let her knpow the LFTs are normal

## 2015-02-21 DIAGNOSIS — M6281 Muscle weakness (generalized): Secondary | ICD-10-CM | POA: Diagnosis not present

## 2015-02-21 DIAGNOSIS — M25511 Pain in right shoulder: Secondary | ICD-10-CM | POA: Diagnosis not present

## 2015-02-26 DIAGNOSIS — M6281 Muscle weakness (generalized): Secondary | ICD-10-CM | POA: Diagnosis not present

## 2015-02-26 DIAGNOSIS — M25511 Pain in right shoulder: Secondary | ICD-10-CM | POA: Diagnosis not present

## 2015-02-28 DIAGNOSIS — M25511 Pain in right shoulder: Secondary | ICD-10-CM | POA: Diagnosis not present

## 2015-02-28 DIAGNOSIS — M6281 Muscle weakness (generalized): Secondary | ICD-10-CM | POA: Diagnosis not present

## 2015-03-05 DIAGNOSIS — M6281 Muscle weakness (generalized): Secondary | ICD-10-CM | POA: Diagnosis not present

## 2015-03-05 DIAGNOSIS — M25511 Pain in right shoulder: Secondary | ICD-10-CM | POA: Diagnosis not present

## 2015-03-06 ENCOUNTER — Encounter: Payer: Self-pay | Admitting: *Deleted

## 2015-03-12 DIAGNOSIS — M6281 Muscle weakness (generalized): Secondary | ICD-10-CM | POA: Diagnosis not present

## 2015-03-12 DIAGNOSIS — M25511 Pain in right shoulder: Secondary | ICD-10-CM | POA: Diagnosis not present

## 2015-03-19 DIAGNOSIS — M6281 Muscle weakness (generalized): Secondary | ICD-10-CM | POA: Diagnosis not present

## 2015-03-19 DIAGNOSIS — M25511 Pain in right shoulder: Secondary | ICD-10-CM | POA: Diagnosis not present

## 2015-03-21 DIAGNOSIS — M25511 Pain in right shoulder: Secondary | ICD-10-CM | POA: Diagnosis not present

## 2015-03-21 DIAGNOSIS — M6281 Muscle weakness (generalized): Secondary | ICD-10-CM | POA: Diagnosis not present

## 2015-03-22 ENCOUNTER — Other Ambulatory Visit: Payer: Self-pay | Admitting: *Deleted

## 2015-03-22 DIAGNOSIS — G35 Multiple sclerosis: Secondary | ICD-10-CM | POA: Diagnosis not present

## 2015-03-22 MED ORDER — FENTANYL 25 MCG/HR TD PT72: 25.0000 ug | MEDICATED_PATCH | TRANSDERMAL | Status: DC

## 2015-03-22 NOTE — Telephone Encounter (Signed)
Rx. printed for today and postdated for 12-1 destroyed, witnessed by RAS/fim

## 2015-03-22 NOTE — Telephone Encounter (Signed)
Fentanyl rx. provided at infusion appt/fim

## 2015-04-19 ENCOUNTER — Other Ambulatory Visit: Payer: Self-pay | Admitting: *Deleted

## 2015-04-19 DIAGNOSIS — G35 Multiple sclerosis: Secondary | ICD-10-CM | POA: Diagnosis not present

## 2015-04-19 MED ORDER — FENTANYL 25 MCG/HR TD PT72: 25.0000 ug | MEDICATED_PATCH | TRANSDERMAL | Status: DC

## 2015-04-19 NOTE — Telephone Encounter (Signed)
Fentanyl rx. provided at infusion appt./fim

## 2015-05-09 ENCOUNTER — Ambulatory Visit: Payer: Medicare Other | Admitting: Neurology

## 2015-05-14 ENCOUNTER — Telehealth: Payer: Self-pay | Admitting: Neurology

## 2015-05-14 MED ORDER — DULOXETINE HCL 60 MG PO CPEP: 60.0000 mg | ORAL_CAPSULE | Freq: Every day | ORAL | Status: DC

## 2015-05-14 NOTE — Telephone Encounter (Signed)
Pt called said on she needs refill for DULoxetine (CYMBALTA) 60 MG capsule . She has been out for 2 days.

## 2015-05-15 ENCOUNTER — Encounter: Payer: Self-pay | Admitting: Neurology

## 2015-05-20 ENCOUNTER — Ambulatory Visit: Payer: Medicare Other | Admitting: Neurology

## 2015-05-21 ENCOUNTER — Encounter: Payer: Self-pay | Admitting: Neurology

## 2015-05-21 ENCOUNTER — Ambulatory Visit (INDEPENDENT_AMBULATORY_CARE_PROVIDER_SITE_OTHER): Payer: Medicare Other | Admitting: Neurology

## 2015-05-21 VITALS — BP 134/80 | HR 84 | Resp 16 | Ht 63.0 in | Wt 195.4 lb

## 2015-05-21 DIAGNOSIS — R208 Other disturbances of skin sensation: Secondary | ICD-10-CM | POA: Diagnosis not present

## 2015-05-21 DIAGNOSIS — F329 Major depressive disorder, single episode, unspecified: Secondary | ICD-10-CM

## 2015-05-21 DIAGNOSIS — G4700 Insomnia, unspecified: Secondary | ICD-10-CM

## 2015-05-21 DIAGNOSIS — F32A Depression, unspecified: Secondary | ICD-10-CM

## 2015-05-21 DIAGNOSIS — Z79899 Other long term (current) drug therapy: Secondary | ICD-10-CM | POA: Diagnosis not present

## 2015-05-21 DIAGNOSIS — R5382 Chronic fatigue, unspecified: Secondary | ICD-10-CM

## 2015-05-21 DIAGNOSIS — G801 Spastic diplegic cerebral palsy: Secondary | ICD-10-CM | POA: Diagnosis not present

## 2015-05-21 DIAGNOSIS — G35 Multiple sclerosis: Secondary | ICD-10-CM | POA: Diagnosis not present

## 2015-05-21 MED ORDER — DULOXETINE HCL 60 MG PO CPEP: 60.0000 mg | ORAL_CAPSULE | Freq: Every day | ORAL | Status: AC

## 2015-05-21 MED ORDER — FENTANYL 25 MCG/HR TD PT72: 25.0000 ug | MEDICATED_PATCH | TRANSDERMAL | Status: DC

## 2015-05-21 MED ORDER — DOXEPIN HCL 10 MG PO CAPS: 10.0000 mg | ORAL_CAPSULE | Freq: Every day | ORAL | Status: DC

## 2015-05-21 NOTE — Progress Notes (Signed)
[facility]  PATIENT: Mary Strong DOB: 1960/10/09  REFERRING CLINICIAN: Gar Strong  HISTORY FROM: patient REASON FOR VISIT: MS and pain   HISTORICAL  CHIEF COMPLAINT:  Chief Complaint  Patient presents with  . Multiple Sclerosis    Sts. she continues to tolerate Tysabri well.  JCV ab last checked on 02-13-15 and was positive at 0.79.  Sts. she is having more bilat thigh and knee pain.  She wonders if a knee brace will help her./fim    HISTORY OF PRESENT ILLNESS:  Mary Strong is a 55 yo woman with MS and related symptoms.   MS:    She continues to be infused with Tysabri on a every 4 week basis. She tolerates Tysabri very well. Her JCV antibody was low positive (0.79) on 02/13/2015. She has not had any definite exacerbation recently.  Gait:  She has difficulty with her gait and uses a cane as she was falling some.    Her knees seem to give out at times, especially on her left.  Knees also ache.   Knee Xray was fine and an NSAID was started.      She also has more spasticity with more pain in the legs. She takes diazepam, 15 mg at bedtime which helps the spasticity.   Pain/sensation:   She reports a lot of leg pain, mostly around her knees, left worse than right..   She gets partial benefit with fentanyl.   She also has a dysesthetic pain.   She is on lamotrigine for dysesthetic pain and groin pain is better though she still has a lot of leg pain    She is also on duloxetine that sometimes helps pain.   She has less painful tingling in the hands and sometimes her fingers draw up a bit.  Spasticity is also painful at times.       Fatigue/Sleep:   She notes a lot of fatigue the past year, ,worse if she sleeps poorly.  . Additionally she notes that she sleeps poorly and has slept worse the past few months.    She has both sleep onset insomnia and sleep maintenance insomnia, ans she gets some benefit from valium and also takes Benadryl.    Trazodone    Mood/cognitive:   She is noting some depression despite duloxetine 60 mg daily but is better than last year.     She has some crying spells.   She gets some agitiation at times.       Vision:   She deneis definite MS related visual problem.   She had a visual hallucination,  Seeing a cat, and occasional sees things that are not there.     Bladder:   She has mild urinary frequency and urgency, though not everyday.   She has had loose stools, that worsened on Metformin (she stopped).      MS History:   In 2005, she presented with least gait, muscle spasms and pain. SHe was found to have a transverse myelitis. Initially, she was placed on Avonex. Due to worsening symptoms, she was switched to Tysabri around 2010 or 2011 and had 52 doses. However, she had converted to JCV antibody positive status and her titer rose over the past couple of determinations. Because of that, the Tysabri was discontinued as she had a higher risk of PML and she was started on Tecfidera .  She felt much better on Tysabri and went back on in 2016.     REVIEW  OF SYSTEMS:  Constitutional: No fevers, chills, sweats, or change in appetite.  She has fatigue Eyes: No visual changes, double vision, eye pain Ear, nose and throat: No hearing loss, ear pain, nasal congestion, sore throat Cardiovascular: No chest pain, palpitations Respiratory:  No shortness of breath at rest or with exertion.   No wheezes GastrointestinaI: No nausea, vomiting, diarrhea, abdominal pain, fecal incontinence Genitourinary:  No dysuria, urinary retention or frequency.  No nocturia. Musculoskeletal:  She notes pain in back, hips, legs Integumentary: No rash, pruritus, skin lesions Neurological: as above Psychiatric: She has depression and mild anxiety Endocrine: No palpitations, diaphoresis, change in appetite, change in weigh or increased thirst Hematologic/Lymphatic:  No anemia, purpura, petechiae. Allergic/Immunologic: No itchy/runny eyes, nasal  congestion, recent allergic reactions, rashes  ALLERGIES: Allergies  Allergen Reactions  . Shellfish Allergy Anaphylaxis  . Copaxone [Glatiramer Acetate] Hives  . Mycinaire Nasal Mist [Sodium Chloride]   . Penicillins Hives  . Vancomycin Itching    HOME MEDICATIONS:   Current outpatient prescriptions:  .  Biotin 5000 MCG CAPS, Take by mouth daily., Disp: , Rfl:  .  diazepam (VALIUM) 5 MG tablet, Take 3 tablets (15 mg total) by mouth at bedtime., Disp: 90 tablet, Rfl: 5 .  DULoxetine (CYMBALTA) 60 MG capsule, Take 1 capsule (60 mg total) by mouth at bedtime., Disp: 30 capsule, Rfl: 1 .  fentaNYL (DURAGESIC) 25 MCG/HR patch, Place 1 patch (25 mcg total) onto the skin every 3 (three) days., Disp: 10 patch, Rfl: 0 .  ibuprofen (ADVIL,MOTRIN) 800 MG tablet, Take 800 mg by mouth 2 (two) times daily., Disp: , Rfl:  .  lamoTRIgine (LAMICTAL) 200 MG tablet, Take 1 tablet (200 mg total) by mouth 2 (two) times daily., Disp: 60 tablet, Rfl: 12 .  levothyroxine (SYNTHROID, LEVOTHROID) 137 MCG tablet, Take 137 mcg by mouth daily., Disp: , Rfl:  .  lisinopril-hydrochlorothiazide (PRINZIDE,ZESTORETIC) 20-12.5 MG per tablet, daily., Disp: , Rfl:  .  mometasone (ELOCON) 0.1 % cream, , Disp: , Rfl:  .  natalizumab (TYSABRI) 300 MG/15ML injection, Inject 300 mg into the vein every 30 (thirty) days., Disp: , Rfl:  .  omeprazole (PRILOSEC) 20 MG capsule, Take 20 mg by mouth daily., Disp: , Rfl:  .  meloxicam (MOBIC) 15 MG tablet, Reported on 05/21/2015, Disp: , Rfl:  .  tiZANidine (ZANAFLEX) 4 MG tablet, Take 1 tablet (4 mg total) by mouth 3 (three) times daily. (Patient not taking: Reported on 05/21/2015), Disp: 90 tablet, Rfl: 5   PAST MEDICAL HISTORY: Past Medical History  Diagnosis Date  . Palpitations   . Diabetes mellitus without complication ([place])   . Multiple sclerosis ([place])   . Movement disorder   . Hypertension   . Vision abnormalities   . Neuropathy ([place])     PAST SURGICAL  HISTORY: Past Surgical History  Procedure Laterality Date  . Cholecystectomy    . Cesarean section    . Tonsillectomy      FAMILY HISTORY: Family History  Problem Relation Age of Onset  . Multiple sclerosis Mother   . Arthritis/Rheumatoid Mother   . Psoriasis Father     SOCIAL HISTORY:  Social History   Social History  . Marital Status: Married    Spouse Name: N/A  . Number of Children: N/A  . Years of Education: N/A   Occupational History  . Not on file.   Social History Main Topics  . Smoking status: Never Smoker   . Smokeless tobacco: Not on file  .  Alcohol Use: 0.0 oz/week    0 Standard drinks or equivalent per week     Comment: Rarely  . Drug Use: Yes     Comment: occasional marijuana  . Sexual Activity: Not on file   Other Topics Concern  . Not on file   Social History Narrative     PHYSICAL EXAM  Filed Vitals:   05/21/15 1047  BP: 134/80  Pulse: 84  Resp: 16  Height: 5\' 3"  (1.6 m)  Weight: 195 lb 6.4 oz (88.633 kg)    Body mass index is 34.62 kg/(m^2).   General: The patient is well-developed and well-nourished and in no acute distress  Musculoskeletal:  Tender over lower back and hips. Left > right.  Knees are tender.  Neurologic Exam  Mental status: The patient is alert and oriented x 3 at the time of the examination. The patient has apparent normal recent and remote memory, with an apparently normal attention span and concentration ability.   Speech is normal.  Mood:  She is tearful initially and expresses frustration with her pain.    Cranial nerves: Extraocular movements are full. Pupils are equal, round, and reactive to light and accomodation.   Facial symmetry is present. There is good facial sensation to soft touch bilaterally.Facial strength is normal.  Trapezius and sternocleidomastoid strength is normal. No dysarthria is noted.    No obvious hearing deficits are noted.  Motor:  Muscle bulk is normal and tone is mildly increased  in legs.   Strength is  5 / 5 in all 4 extremities.   Sensory: Sensory testing is intact to pinprick, soft touch, vibration sensation in arms but she reports decreased sensation in left leg to these modalities.     Coordination: Cerebellar testing reveals good finger-nose-finger   Gait and station: Station is normal and gait is arthritic and wide.   Tandem gait is wide. Romberg is negative.   Reflexes: Deep tendon reflexes are symmetric and 3+ bilaterally.      DIAGNOSTIC DATA (LABS, IMAGING, TESTING) - I reviewed patient records, labs, notes, testing and imaging myself where available.      ASSESSMENT AND PLAN  Multiple sclerosis (HCC)  Spastic diplegia (HCC)  Chronic fatigue  Dysesthesia  Insomnia  High risk medication use  Depression   1.   Continiue Tysabri.  She is aware that she has a risk of PML as she is JCV antibody positive. 2   continue Valium daily at bedtime and add doxepin 10 mg nightly for insomnia. Valium also helps her spasticity some..  Continue duloxetine. 3.  Fentanyl 25 mcg, we discussed that due to night time valium, I am reluctant to increase to a higher dose 4.   Continue Lamotrigine 200 bid  She will return in 4 months, sooner if new or worsening neurologic symptoms.      Richard A. Felecia Shelling, MD, PhD [id number], [id number] AM Certified in Neurology, Clinical Neurophysiology, Sleep Medicine, Pain Medicine and Neuroimaging  [facility] [address] [place], [place] [address] [phone]

## 2015-05-30 DIAGNOSIS — G35 Multiple sclerosis: Secondary | ICD-10-CM | POA: Diagnosis not present

## 2015-06-13 DIAGNOSIS — E039 Hypothyroidism, unspecified: Secondary | ICD-10-CM | POA: Diagnosis not present

## 2015-06-13 DIAGNOSIS — K219 Gastro-esophageal reflux disease without esophagitis: Secondary | ICD-10-CM | POA: Diagnosis not present

## 2015-06-13 DIAGNOSIS — E782 Mixed hyperlipidemia: Secondary | ICD-10-CM | POA: Diagnosis not present

## 2015-06-13 DIAGNOSIS — E119 Type 2 diabetes mellitus without complications: Secondary | ICD-10-CM | POA: Diagnosis not present

## 2015-06-18 DIAGNOSIS — K219 Gastro-esophageal reflux disease without esophagitis: Secondary | ICD-10-CM | POA: Diagnosis not present

## 2015-06-18 DIAGNOSIS — E119 Type 2 diabetes mellitus without complications: Secondary | ICD-10-CM | POA: Diagnosis not present

## 2015-06-18 DIAGNOSIS — F331 Major depressive disorder, recurrent, moderate: Secondary | ICD-10-CM | POA: Diagnosis not present

## 2015-06-18 DIAGNOSIS — E6609 Other obesity due to excess calories: Secondary | ICD-10-CM | POA: Diagnosis not present

## 2015-06-18 DIAGNOSIS — D485 Neoplasm of uncertain behavior of skin: Secondary | ICD-10-CM | POA: Diagnosis not present

## 2015-06-18 DIAGNOSIS — E8881 Metabolic syndrome: Secondary | ICD-10-CM | POA: Diagnosis not present

## 2015-06-18 DIAGNOSIS — E782 Mixed hyperlipidemia: Secondary | ICD-10-CM | POA: Diagnosis not present

## 2015-06-18 DIAGNOSIS — E039 Hypothyroidism, unspecified: Secondary | ICD-10-CM | POA: Diagnosis not present

## 2015-06-18 DIAGNOSIS — G35 Multiple sclerosis: Secondary | ICD-10-CM | POA: Diagnosis not present

## 2015-06-24 ENCOUNTER — Telehealth: Payer: Self-pay | Admitting: Neurology

## 2015-06-24 MED ORDER — GABAPENTIN 300 MG PO CAPS: 300.0000 mg | ORAL_CAPSULE | Freq: Three times a day (TID) | ORAL | Status: DC

## 2015-06-24 NOTE — Telephone Encounter (Signed)
Patient is calling and states that she normally takes the Tysabri infusions but is having problems with her MS.  She wants to know if she can take the IV steroids for a few days. Please call.  Thanks!

## 2015-06-24 NOTE — Telephone Encounter (Signed)
PC with Kim--she sts. bilat leg pain is worse, Fentanyl is not helping as much as usual.  She wonders if she can try IV SM.  Will check with RAS and call her back/fim

## 2015-06-24 NOTE — Telephone Encounter (Signed)
PC with Mary Strong again--per RAS, I have advised IV SM likely will not help bilat leg pain.  Have offered Gabapentin 300mg  po tid if she has not tried it recently, and Lamictal titration if she has.  She sts. she has not tried Gabapentin lately--rx. escribed to [facility] in [place] per her request/fim

## 2015-06-27 ENCOUNTER — Telehealth: Payer: Self-pay | Admitting: Neurology

## 2015-06-27 MED ORDER — FENTANYL 25 MCG/HR TD PT72: 25.0000 ug | MEDICATED_PATCH | TRANSDERMAL | Status: DC

## 2015-06-27 NOTE — Telephone Encounter (Signed)
Patient is calling to get a written Rx for fentaNYL (DURAGESIC) 25 MCG/HR patch. I advised the Rx will be ready in 24 hours unless otherwise advised by the nurse. The patient says she would like to pick the Rx up around 10 am tomorrow. Also the patient is scheduled for an infusion tomorrow and has to cancel because of funding. She says she has already left a message for Ada.

## 2015-06-27 NOTE — Telephone Encounter (Signed)
Fentanyl rx. up front [facility]/fim 

## 2015-06-29 DIAGNOSIS — C44329 Squamous cell carcinoma of skin of other parts of face: Secondary | ICD-10-CM | POA: Diagnosis not present

## 2015-06-29 DIAGNOSIS — C44319 Basal cell carcinoma of skin of other parts of face: Secondary | ICD-10-CM | POA: Diagnosis not present

## 2015-07-16 ENCOUNTER — Telehealth: Payer: Self-pay | Admitting: Neurology

## 2015-07-16 NOTE — Telephone Encounter (Signed)
Pt called said her grandson was sick last week and she kept him so her daughter and son-in-law could work. sts today it has been confirmed that he has whooping cough along with daughter and son-in-law. She is sick and is waiting for PCP office to call her back. She is inquiring if there is any risk with having whooping cough and MS and is there anything she should tell her PCP.

## 2015-07-16 NOTE — Telephone Encounter (Signed)
I have spoken with Mary Strong and per RAS, advised ok for pertussis vaccine/fim

## 2015-07-17 DIAGNOSIS — J0101 Acute recurrent maxillary sinusitis: Secondary | ICD-10-CM | POA: Diagnosis not present

## 2015-07-17 DIAGNOSIS — J209 Acute bronchitis, unspecified: Secondary | ICD-10-CM | POA: Diagnosis not present

## 2015-07-17 DIAGNOSIS — R05 Cough: Secondary | ICD-10-CM | POA: Diagnosis not present

## 2015-07-18 NOTE — Telephone Encounter (Signed)
error 

## 2015-07-23 ENCOUNTER — Other Ambulatory Visit: Payer: Self-pay | Admitting: *Deleted

## 2015-07-23 MED ORDER — FENTANYL 25 MCG/HR TD PT72: 25.0000 ug | MEDICATED_PATCH | TRANSDERMAL | Status: DC

## 2015-07-23 NOTE — Telephone Encounter (Signed)
Message received from [name] in the infusion suite that Mary Strong needs r/f of Fentanyl patches at infusion appt. on Friday.  Rx. printed, signed, up front [facility]//fim

## 2015-07-26 DIAGNOSIS — G35 Multiple sclerosis: Secondary | ICD-10-CM | POA: Diagnosis not present

## 2015-07-27 ENCOUNTER — Other Ambulatory Visit: Payer: Self-pay | Admitting: Neurology

## 2015-08-01 ENCOUNTER — Telehealth: Payer: Self-pay | Admitting: Neurology

## 2015-08-01 MED ORDER — GABAPENTIN 600 MG PO TABS: 600.0000 mg | ORAL_TABLET | Freq: Three times a day (TID) | ORAL | Status: DC

## 2015-08-01 NOTE — Telephone Encounter (Signed)
Patient called to request possible increase in gabapentin (NEURONTIN) 300 MG capsule

## 2015-08-01 NOTE — Telephone Encounter (Signed)
I spoke with Mary Strong earlier today.  She stated she was tolerating Gabapentin well and it was helping pain some; she would like to increase it to see if she can get more pain relief.  LMOM (identified vm) that per RAS, it is ok to increase to 600mg  tid.  New rx. sent to [facility] in [place].  She does not need to return this call unless she has questions/fim

## 2015-08-06 ENCOUNTER — Telehealth: Payer: Self-pay | Admitting: *Deleted

## 2015-08-06 NOTE — Telephone Encounter (Signed)
Diazepam rx. faxed to [facility] in [place].  Fax # [phone]

## 2015-08-29 ENCOUNTER — Telehealth: Payer: Self-pay | Admitting: Neurology

## 2015-08-29 MED ORDER — FENTANYL 25 MCG/HR TD PT72: 25.0000 ug | MEDICATED_PATCH | TRANSDERMAL | Status: DC

## 2015-08-29 NOTE — Telephone Encounter (Signed)
Patient called to request refill of fentaNYL (DURAGESIC) 25 MCG/HR patch

## 2015-08-29 NOTE — Telephone Encounter (Signed)
RAS is ooo today.  Rx. awaiting Dr. Edwena Felty sig/fim

## 2015-08-29 NOTE — Telephone Encounter (Signed)
Rx. up front [facility]/fim 

## 2015-08-30 ENCOUNTER — Encounter: Payer: Self-pay | Admitting: *Deleted

## 2015-09-02 MED ORDER — FENTANYL 25 MCG/HR TD PT72: 25.0000 ug | MEDICATED_PATCH | TRANSDERMAL | Status: DC

## 2015-09-02 NOTE — Telephone Encounter (Signed)
Patient stated she took the written Rx fentaNYL 25 MCG/HR patch to [facility] to have filled and stated since there was no other Rx on that paper she cut off her Rx before she took it.  She says that [facility] would not fill the Rx since it had been cut off and she needs another written Rx.  She says she has all pieces of the Rx paper and can bring it all back when she comes in to get her new Rx. Thanks!

## 2015-09-02 NOTE — Addendum Note (Signed)
Addended by: France Ravens I on: 09/02/2015 04:03 PM   Modules accepted: Orders

## 2015-09-02 NOTE — Telephone Encounter (Signed)
Per RAS, ok to give new rx.  I have spoken with Mary Strong and advised her of same.  She has an infusion appt. tomorrow--will bring in old rx. and pick up new one tomorrow./fim

## 2015-09-03 ENCOUNTER — Encounter: Payer: Self-pay | Admitting: *Deleted

## 2015-09-03 DIAGNOSIS — G35 Multiple sclerosis: Secondary | ICD-10-CM | POA: Diagnosis not present

## 2015-09-18 ENCOUNTER — Encounter: Payer: Self-pay | Admitting: Neurology

## 2015-09-18 ENCOUNTER — Ambulatory Visit (INDEPENDENT_AMBULATORY_CARE_PROVIDER_SITE_OTHER): Payer: Medicare Other | Admitting: Neurology

## 2015-09-18 VITALS — BP 110/68 | HR 96 | Resp 18 | Ht 63.0 in | Wt 179.5 lb

## 2015-09-18 DIAGNOSIS — R5382 Chronic fatigue, unspecified: Secondary | ICD-10-CM | POA: Diagnosis not present

## 2015-09-18 DIAGNOSIS — R269 Unspecified abnormalities of gait and mobility: Secondary | ICD-10-CM

## 2015-09-18 DIAGNOSIS — Z79899 Other long term (current) drug therapy: Secondary | ICD-10-CM

## 2015-09-18 DIAGNOSIS — R208 Other disturbances of skin sensation: Secondary | ICD-10-CM

## 2015-09-18 DIAGNOSIS — G35 Multiple sclerosis: Secondary | ICD-10-CM | POA: Diagnosis not present

## 2015-09-18 DIAGNOSIS — E559 Vitamin D deficiency, unspecified: Secondary | ICD-10-CM | POA: Diagnosis not present

## 2015-09-18 DIAGNOSIS — G4700 Insomnia, unspecified: Secondary | ICD-10-CM | POA: Diagnosis not present

## 2015-09-18 DIAGNOSIS — F32A Depression, unspecified: Secondary | ICD-10-CM

## 2015-09-18 DIAGNOSIS — F329 Major depressive disorder, single episode, unspecified: Secondary | ICD-10-CM

## 2015-09-18 DIAGNOSIS — G801 Spastic diplegic cerebral palsy: Secondary | ICD-10-CM | POA: Diagnosis not present

## 2015-09-18 MED ORDER — FENTANYL 25 MCG/HR TD PT72: 25.0000 ug | MEDICATED_PATCH | TRANSDERMAL | Status: DC

## 2015-09-18 NOTE — Progress Notes (Signed)
[facility]  PATIENT: Mary Strong DOB: 28-Sep-1960  REFERRING CLINICIAN: Gar Ponto  HISTORY FROM: patient REASON FOR VISIT: MS and pain   HISTORICAL  CHIEF COMPLAINT:  Chief Complaint  Patient presents with  . Multiple Sclerosis    Sts. she continues to tolerate Tysabri well.  JCV ab last checked 02-13-15 and was positive at 0.79.  Denies new or worsening sx./fim    HISTORY OF PRESENT ILLNESS:  Mary Strong is a 55 yo woman with MS and related symptoms.      She continues to be infused with Tysabri on a every 4 week basis. She tolerates Tysabri very well. Her JCV antibody was low positive (0.79) on 02/13/2015.    She has not had any definite exacerbation recently.   Her last infusion was a week ago (next one 10/01/2015).   We discussed retesting the JCV antibody today. She has converted into a middle or high positive, we will need to consider a different medication such as Lemtrada or ocrelizumab. Josefina Do  would not be a good option for her as she has fatty liver with borderline elevated LFTs at times.  Gait:  She reports some difficulty with her gait due to knees popping and giving out at times.   She ses a cane as she was falling some.    Her knees can be painful at times and that seems to affect her walking.    She also has more spasticity with more pain in the legs. She takes diazepam, 15 mg at bedtime which helps the spasticity.   Pain/sensation:   She reports a lot of leg pain, mostly around her knees, left worse than right..  They are often swollen.    She has some benefit with fentanyl.  She feels it helps less the third day and best the second day of each patch.    She also has a dysesthetic pain.   She is on lamotrigine for dysesthetic pain and groin pain is better though she still has a lot of leg pain    She is also on duloxetine but she is unsure it helps.   She has less painful tingling in the hands and sometimes her fingers draw up a bit.   Spasticity is also painful at times.     She also has some neck pain   She also has a right rotator cuff injury and has bene told she may one day need surgery.    Fatigue/Sleep:   She notes a lot of fatigue the past year, ,worse if she sleeps poorly.  . Additionally she notes that she sleeps poorly and has slept worse the past few months.    Sleep is better with gabapentin and valium at night.  Mood/cognitive:   She is noting some depression despite duloxetine 60 mg daily but is better than last year.   She has some crying spells.   She gets some agitiation at times.       Vision:   She denies definite MS related visual problem.  She gets intermittent film/veil on the right.     Bladder/bowel:   She has mild urinary frequency and urgency .   She has had loose stools, that worsened on Metformin (she stopped).    She has constipation going every 3 days on average.  We discussed Miralax instead of Senokot     MS History:   In 2005, she presented with least gait, muscle spasms and pain. SHe was found to  have a transverse myelitis. Initially, she was placed on Avonex. Due to worsening symptoms, she was switched to Tysabri around 2010 or 2011 and had 52 doses. However, she had converted to JCV antibody positive status and her titer rose over the past couple of determinations. Because of that, the Tysabri was discontinued as she had a higher risk of PML and she was started on Tecfidera .  She felt much better on Tysabri and went back on in 2016.     REVIEW OF SYSTEMS:  Constitutional: No fevers, chills, sweats, or change in appetite.  She has fatigue Eyes: No visual changes, double vision, eye pain Ear, nose and throat: No hearing loss, ear pain, nasal congestion, sore throat Cardiovascular: No chest pain, palpitations Respiratory:  No shortness of breath at rest or with exertion.   No wheezes GastrointestinaI: No nausea, vomiting, diarrhea, abdominal pain, fecal incontinence Genitourinary:  No dysuria,  urinary retention or frequency.  No nocturia. Musculoskeletal:  She notes pain in back, hips, legs Integumentary: No rash, pruritus, skin lesions Neurological: as above Psychiatric: She has depression and mild anxiety Endocrine: No palpitations, diaphoresis, change in appetite, change in weigh or increased thirst Hematologic/Lymphatic:  No anemia, purpura, petechiae. Allergic/Immunologic: No itchy/runny eyes, nasal congestion, recent allergic reactions, rashes  ALLERGIES: Allergies  Allergen Reactions  . Shellfish Allergy Anaphylaxis  . Copaxone [Glatiramer Acetate] Hives  . Penicillins Hives  . Vancomycin Itching    HOME MEDICATIONS:   Current outpatient prescriptions:  .  Biotin 5000 MCG CAPS, Take by mouth daily., Disp: , Rfl:  .  diazepam (VALIUM) 5 MG tablet, TAKE THREE TABLETS BY MOUTH AT BEDTIME, Disp: 90 tablet, Rfl: 5 .  doxepin (SINEQUAN) 10 MG capsule, Take 1 capsule (10 mg total) by mouth at bedtime., Disp: 30 capsule, Rfl: 11 .  DULoxetine (CYMBALTA) 60 MG capsule, Take 1 capsule (60 mg total) by mouth at bedtime., Disp: 30 capsule, Rfl: 11 .  fentaNYL (DURAGESIC) 25 MCG/HR patch, Place 1 patch (25 mcg total) onto the skin every 3 (three) days., Disp: 10 patch, Rfl: 0 .  gabapentin (NEURONTIN) 600 MG tablet, Take 1 tablet (600 mg total) by mouth 3 (three) times daily., Disp: 90 tablet, Rfl: 3 .  ibuprofen (ADVIL,MOTRIN) 800 MG tablet, Take 800 mg by mouth 2 (two) times daily., Disp: , Rfl:  .  lamoTRIgine (LAMICTAL) 200 MG tablet, Take 1 tablet (200 mg total) by mouth 2 (two) times daily., Disp: 60 tablet, Rfl: 12 .  levothyroxine (SYNTHROID, LEVOTHROID) 137 MCG tablet, Take 137 mcg by mouth daily., Disp: , Rfl:  .  lisinopril-hydrochlorothiazide (PRINZIDE,ZESTORETIC) 20-12.5 MG per tablet, daily., Disp: , Rfl:  .  mometasone (ELOCON) 0.1 % cream, , Disp: , Rfl:  .  natalizumab (TYSABRI) 300 MG/15ML injection, Inject 300 mg into the vein every 30 (thirty) days., Disp: ,  Rfl:  .  omeprazole (PRILOSEC) 20 MG capsule, Take 20 mg by mouth daily., Disp: , Rfl:  .  tiZANidine (ZANAFLEX) 4 MG tablet, Take 1 tablet (4 mg total) by mouth 3 (three) times daily. (Patient not taking: Reported on 05/21/2015), Disp: 90 tablet, Rfl: 5   PAST MEDICAL HISTORY: Past Medical History  Diagnosis Date  . Palpitations   . Diabetes mellitus without complication ([place])   . Multiple sclerosis ([place])   . Movement disorder   . Hypertension   . Vision abnormalities   . Neuropathy ([place])     PAST SURGICAL HISTORY: Past Surgical History  Procedure Laterality Date  . Cholecystectomy    .  Cesarean section    . Tonsillectomy      FAMILY HISTORY: Family History  Problem Relation Age of Onset  . Multiple sclerosis Mother   . Arthritis/Rheumatoid Mother   . Psoriasis Father     SOCIAL HISTORY:  Social History   Social History  . Marital Status: Married    Spouse Name: N/A  . Number of Children: N/A  . Years of Education: N/A   Occupational History  . Not on file.   Social History Main Topics  . Smoking status: Never Smoker   . Smokeless tobacco: Not on file  . Alcohol Use: 0.0 oz/week    0 Standard drinks or equivalent per week     Comment: Rarely  . Drug Use: Yes     Comment: occasional marijuana  . Sexual Activity: Not on file   Other Topics Concern  . Not on file   Social History Narrative     PHYSICAL EXAM  Filed Vitals:   09/18/15 1107  BP: 110/68  Pulse: 96  Resp: 18  Height: 5\' 3"  (1.6 m)  Weight: 179 lb 8 oz (81.421 kg)    Body mass index is 31.81 kg/(m^2).   General: The patient is well-developed and well-nourished and in no acute distress  Musculoskeletal:  Tender over lower back and hips. Left > right.  Knees are tender.  Neurologic Exam  Mental status: The patient is alert and oriented x 3 at the time of the examination. The patient has apparent normal recent and remote memory, with an apparently normal attention span and  concentration ability.   Speech is normal.  Mood:  She is tearful initially and expresses frustration with her pain.    Cranial nerves: Extraocular movements are full. Pupils are equal, round, and reactive to light and accomodation.   Facial symmetry is present. There is good facial sensation to soft touch bilaterally.Facial strength is normal.  Trapezius and sternocleidomastoid strength is normal. No dysarthria is noted.    No obvious hearing deficits are noted.  Motor:  Muscle bulk is normal and tone is mildly increased in legs.   Strength is  5 / 5 in all 4 extremities.   Sensory: Sensory testing is intact to pinprick, soft touch, vibration sensation in arms but she reports decreased sensation in left leg to these modalities.     Coordination: Cerebellar testing reveals good finger-nose-finger   Gait and station: Station is normal and gait is arthritic and wide.   Tandem gait is wide. Romberg is negative.   Reflexes: Deep tendon reflexes are symmetric and 3+ bilaterally with spread at the knees      DIAGNOSTIC DATA (LABS, IMAGING, TESTING) - I reviewed patient records, labs, notes, testing and imaging myself where available.      ASSESSMENT AND PLAN  Multiple sclerosis (HCC)  Spastic diplegia (HCC)  Chronic fatigue  Dysesthesia  High risk medication use  Insomnia  Vitamin D deficiency  Depression   1.   Continiue Tysabri.  She is aware that she has a risk of PML as she is JCV antibody positive.   We will recheck his JCV antibody. If it has increased further, then I would recommend that we switch her to other ocrelizumab or Lemtrada. 2   continue Valium daily at bedtime and continue duloxetine. 3.  Fentanyl 25 mcg, ok fro nightly Miralax for OIC 4.   She will return in 4 months, sooner if new or worsening neurologic symptoms.     45 minute face-to-face  evaluation with greater than one half of the time counseling and coordinating care about her MS treatments and MS  related symptoms.  Richard A. Felecia Shelling, MD, PhD [id number], [id number] AM Certified in Neurology, Clinical Neurophysiology, Sleep Medicine, Pain Medicine and Neuroimaging  [facility] [address] [place], [place] [address] [phone]

## 2015-09-19 LAB — CBC WITH DIFFERENTIAL/PLATELET
BASOS ABS: 0 10*3/uL (ref 0.0–0.2)
Basos: 0 %
EOS (ABSOLUTE): 0.6 10*3/uL — ABNORMAL HIGH (ref 0.0–0.4)
Eos: 6 %
HEMOGLOBIN: 11.9 g/dL (ref 11.1–15.9)
Hematocrit: 34.5 % (ref 34.0–46.6)
Immature Grans (Abs): 0 10*3/uL (ref 0.0–0.1)
Immature Granulocytes: 0 %
LYMPHS ABS: 2.4 10*3/uL (ref 0.7–3.1)
Lymphs: 27 %
MCH: 29.1 pg (ref 26.6–33.0)
MCHC: 34.5 g/dL (ref 31.5–35.7)
MCV: 84 fL (ref 79–97)
MONOCYTES: 5 %
MONOS ABS: 0.5 10*3/uL (ref 0.1–0.9)
NEUTROS ABS: 5.6 10*3/uL (ref 1.4–7.0)
Neutrophils: 62 %
Platelets: 215 10*3/uL (ref 150–379)
RBC: 4.09 x10E6/uL (ref 3.77–5.28)
RDW: 16.8 % — AB (ref 12.3–15.4)
WBC: 9.2 10*3/uL (ref 3.4–10.8)

## 2015-09-23 ENCOUNTER — Encounter: Payer: Self-pay | Admitting: *Deleted

## 2015-09-28 ENCOUNTER — Inpatient Hospital Stay: Admission: RE | Admit: 2015-09-28 | Payer: 59 | Source: Ambulatory Visit

## 2015-10-01 ENCOUNTER — Other Ambulatory Visit: Payer: Self-pay | Admitting: *Deleted

## 2015-10-01 DIAGNOSIS — G35 Multiple sclerosis: Secondary | ICD-10-CM | POA: Diagnosis not present

## 2015-10-01 MED ORDER — TAMSULOSIN HCL 0.4 MG PO CAPS: 0.4000 mg | ORAL_CAPSULE | Freq: Every day | ORAL | Status: DC

## 2015-10-14 ENCOUNTER — Ambulatory Visit
Admission: RE | Admit: 2015-10-14 | Discharge: 2015-10-14 | Disposition: A | Discharge status: Home / Self Care | Payer: Medicare Other | Source: Ambulatory Visit | Attending: Neurology | Admitting: Neurology

## 2015-10-14 ENCOUNTER — Ambulatory Visit
Admission: RE | Admit: 2015-10-14 | Discharge: 2015-10-14 | Disposition: A | Discharge status: Home / Self Care | Payer: 59 | Source: Ambulatory Visit | Attending: Neurology | Admitting: Neurology

## 2015-10-14 DIAGNOSIS — G35 Multiple sclerosis: Secondary | ICD-10-CM

## 2015-10-14 DIAGNOSIS — R208 Other disturbances of skin sensation: Secondary | ICD-10-CM

## 2015-10-14 DIAGNOSIS — R269 Unspecified abnormalities of gait and mobility: Secondary | ICD-10-CM

## 2015-10-14 MED ORDER — GADOBENATE DIMEGLUMINE 529 MG/ML IV SOLN
17.0000 mL | Freq: Once | INTRAVENOUS | Status: AC | PRN
  Administered 2015-10-14: 17 mL via INTRAVENOUS

## 2015-10-15 ENCOUNTER — Telehealth: Payer: Self-pay | Admitting: Neurology

## 2015-10-15 DIAGNOSIS — R339 Retention of urine, unspecified: Secondary | ICD-10-CM

## 2015-10-15 DIAGNOSIS — G35 Multiple sclerosis: Secondary | ICD-10-CM

## 2015-10-15 NOTE — Telephone Encounter (Signed)
Patient is calling and states she needs a referral to a urologist as discussed with Dr. Felecia Shelling. She prefers Dr. Jeffie Pollock.  Please call.

## 2015-10-15 NOTE — Telephone Encounter (Signed)
I have spoken with Mary Strong this morning.  She continues to c/o urinary retention.  Sts. her mother rec. Dr. Jeffie Pollock at [facility], so she would like a referral to him.  Referral ordered in EPIC/fim

## 2015-10-16 DIAGNOSIS — K219 Gastro-esophageal reflux disease without esophagitis: Secondary | ICD-10-CM | POA: Diagnosis not present

## 2015-10-16 DIAGNOSIS — D649 Anemia, unspecified: Secondary | ICD-10-CM | POA: Diagnosis not present

## 2015-10-16 DIAGNOSIS — E039 Hypothyroidism, unspecified: Secondary | ICD-10-CM | POA: Diagnosis not present

## 2015-10-16 DIAGNOSIS — E119 Type 2 diabetes mellitus without complications: Secondary | ICD-10-CM | POA: Diagnosis not present

## 2015-10-16 DIAGNOSIS — E782 Mixed hyperlipidemia: Secondary | ICD-10-CM | POA: Diagnosis not present

## 2015-10-16 DIAGNOSIS — E8881 Metabolic syndrome: Secondary | ICD-10-CM | POA: Diagnosis not present

## 2015-10-17 ENCOUNTER — Telehealth: Payer: Self-pay | Admitting: *Deleted

## 2015-10-17 NOTE — Telephone Encounter (Signed)
I have spoken with Mary Strong this morning and per RAS, advised that MRI brain shows no new lesions when compared with last MRI, and MRI c-spine shows no MS lesions.  She verbalized understanding of same/fim

## 2015-10-17 NOTE — Telephone Encounter (Signed)
-----   Message from Britt Bottom, MD sent at 10/16/2015  9:20 PM EDT ----- Please let her know the MRi of the brain shows no new lesions compared to earlier MRI.   MRi of cervical spine shows no lesions

## 2015-10-21 ENCOUNTER — Telehealth: Payer: Self-pay | Admitting: Neurology

## 2015-10-21 NOTE — Telephone Encounter (Signed)
[facility] Called patient has declined referral at this time.

## 2015-10-21 NOTE — Telephone Encounter (Signed)
Noted/fim 

## 2015-10-22 DIAGNOSIS — K219 Gastro-esophageal reflux disease without esophagitis: Secondary | ICD-10-CM | POA: Diagnosis not present

## 2015-10-22 DIAGNOSIS — E8881 Metabolic syndrome: Secondary | ICD-10-CM | POA: Diagnosis not present

## 2015-10-22 DIAGNOSIS — E6609 Other obesity due to excess calories: Secondary | ICD-10-CM | POA: Diagnosis not present

## 2015-10-22 DIAGNOSIS — G35 Multiple sclerosis: Secondary | ICD-10-CM | POA: Diagnosis not present

## 2015-10-22 DIAGNOSIS — E782 Mixed hyperlipidemia: Secondary | ICD-10-CM | POA: Diagnosis not present

## 2015-10-22 DIAGNOSIS — E039 Hypothyroidism, unspecified: Secondary | ICD-10-CM | POA: Diagnosis not present

## 2015-10-22 DIAGNOSIS — F331 Major depressive disorder, recurrent, moderate: Secondary | ICD-10-CM | POA: Diagnosis not present

## 2015-10-22 DIAGNOSIS — E119 Type 2 diabetes mellitus without complications: Secondary | ICD-10-CM | POA: Diagnosis not present

## 2015-10-28 ENCOUNTER — Telehealth: Payer: Self-pay | Admitting: Neurology

## 2015-10-28 MED ORDER — FENTANYL 25 MCG/HR TD PT72: 25.0000 ug | MEDICATED_PATCH | TRANSDERMAL | Status: DC

## 2015-10-28 NOTE — Telephone Encounter (Signed)
Rx. awaiting RAS sig/fim 

## 2015-10-28 NOTE — Telephone Encounter (Signed)
Fentanyl rx. up front [facility]/fim 

## 2015-10-28 NOTE — Telephone Encounter (Signed)
Pt called said she appt tomorrow for infusion and she will need refill for fentaNYL (DURAGESIC) 25 MCG/HR patch . She will pick up at appt tomorrow

## 2015-10-29 DIAGNOSIS — G35 Multiple sclerosis: Secondary | ICD-10-CM | POA: Diagnosis not present

## 2015-10-31 ENCOUNTER — Telehealth: Payer: Self-pay | Admitting: Neurology

## 2015-10-31 NOTE — Telephone Encounter (Signed)
I have spoken with [name] this morning and advised that Mary Strong is still on Tysabri, has not missed an infusion/fim

## 2015-10-31 NOTE — Telephone Encounter (Signed)
[name] with [employer] called to see if pt is still on Tysabri, Please call [phone] ext : [phone]

## 2015-11-07 ENCOUNTER — Telehealth: Payer: Self-pay | Admitting: Diagnostic Neuroimaging

## 2015-11-07 DIAGNOSIS — M25551 Pain in right hip: Secondary | ICD-10-CM | POA: Diagnosis not present

## 2015-11-07 DIAGNOSIS — M25561 Pain in right knee: Secondary | ICD-10-CM | POA: Diagnosis not present

## 2015-11-07 NOTE — Telephone Encounter (Signed)
MRI Brain report faxed to [facility]  (f) [phone]

## 2015-11-22 ENCOUNTER — Telehealth: Payer: Self-pay | Admitting: Neurology

## 2015-11-22 MED ORDER — FENTANYL 25 MCG/HR TD PT72: 25.0000 ug | MEDICATED_PATCH | TRANSDERMAL | Status: DC

## 2015-11-22 NOTE — Telephone Encounter (Signed)
Fentanyl rx. up front [facility]/fim 

## 2015-11-22 NOTE — Telephone Encounter (Signed)
Rx. awaiting RAS sig/fim 

## 2015-11-22 NOTE — Telephone Encounter (Signed)
Patient called to request refill of fentaNYL (DURAGESIC) 25 MCG/HR patch to pick up on Monday July 24th.

## 2015-11-25 ENCOUNTER — Encounter (HOSPITAL_COMMUNITY): Payer: Self-pay

## 2015-11-25 ENCOUNTER — Encounter (HOSPITAL_COMMUNITY)
Admission: RE | Admit: 2015-11-25 | Discharge: 2015-11-25 | Disposition: A | Discharge status: Home / Self Care | Payer: Medicare Other | Source: Ambulatory Visit | Attending: Orthopaedic Surgery | Admitting: Orthopaedic Surgery

## 2015-11-25 ENCOUNTER — Other Ambulatory Visit (HOSPITAL_COMMUNITY): Payer: Self-pay | Admitting: *Deleted

## 2015-11-25 ENCOUNTER — Ambulatory Visit (HOSPITAL_COMMUNITY)
Admission: RE | Admit: 2015-11-25 | Discharge: 2015-11-25 | Disposition: A | Discharge status: Home / Self Care | Payer: Medicare Other | Source: Ambulatory Visit | Attending: Surgery | Admitting: Surgery

## 2015-11-25 DIAGNOSIS — Z01818 Encounter for other preprocedural examination: Secondary | ICD-10-CM

## 2015-11-25 HISTORY — DX: Neuromuscular dysfunction of bladder, unspecified: N31.9

## 2015-11-25 HISTORY — DX: Depression, unspecified: F32.A

## 2015-11-25 HISTORY — DX: Family history of other specified conditions: Z84.89

## 2015-11-25 HISTORY — DX: Headache: R51

## 2015-11-25 HISTORY — DX: Restless legs syndrome: G25.81

## 2015-11-25 HISTORY — DX: Hypothyroidism, unspecified: E03.9

## 2015-11-25 HISTORY — DX: Constipation, unspecified: K59.00

## 2015-11-25 HISTORY — DX: Gastro-esophageal reflux disease without esophagitis: K21.9

## 2015-11-25 HISTORY — DX: Major depressive disorder, single episode, unspecified: F32.9

## 2015-11-25 HISTORY — DX: Anxiety disorder, unspecified: F41.9

## 2015-11-25 HISTORY — DX: Thyrotoxicosis, unspecified without thyrotoxic crisis or storm: E05.90

## 2015-11-25 HISTORY — DX: Personal history of other specified conditions: Z87.898

## 2015-11-25 HISTORY — DX: Malignant (primary) neoplasm, unspecified: C80.1

## 2015-11-25 HISTORY — DX: Unspecified osteoarthritis, unspecified site: M19.90

## 2015-11-25 HISTORY — DX: Fatty (change of) liver, not elsewhere classified: K76.0

## 2015-11-25 HISTORY — DX: Sleep apnea, unspecified: G47.30

## 2015-11-25 HISTORY — DX: Other amnesia: R41.3

## 2015-11-25 LAB — URINALYSIS, ROUTINE W REFLEX MICROSCOPIC
GLUCOSE, UA: NEGATIVE mg/dL
Hgb urine dipstick: NEGATIVE
Ketones, ur: NEGATIVE mg/dL
NITRITE: NEGATIVE
PH: 5 (ref 5.0–8.0)
PROTEIN: NEGATIVE mg/dL
Specific Gravity, Urine: 1.026 (ref 1.005–1.030)

## 2015-11-25 LAB — URINE MICROSCOPIC-ADD ON

## 2015-11-25 LAB — CBC
HEMATOCRIT: 32.9 % — AB (ref 36.0–46.0)
HEMOGLOBIN: 11 g/dL — AB (ref 12.0–15.0)
MCH: 28.8 pg (ref 26.0–34.0)
MCHC: 33.4 g/dL (ref 30.0–36.0)
MCV: 86.1 fL (ref 78.0–100.0)
PLATELETS: 154 10*3/uL (ref 150–400)
RBC: 3.82 MIL/uL — ABNORMAL LOW (ref 3.87–5.11)
RDW: 16.2 % — ABNORMAL HIGH (ref 11.5–15.5)
WBC: 6.1 10*3/uL (ref 4.0–10.5)

## 2015-11-25 LAB — PROTIME-INR
INR: 1.03 (ref 0.00–1.49)
Prothrombin Time: 13.7 seconds (ref 11.6–15.2)

## 2015-11-25 LAB — COMPREHENSIVE METABOLIC PANEL
ALT: 18 U/L (ref 14–54)
AST: 23 U/L (ref 15–41)
Albumin: 4.1 g/dL (ref 3.5–5.0)
Alkaline Phosphatase: 52 U/L (ref 38–126)
Anion gap: 9 (ref 5–15)
BUN: 24 mg/dL — AB (ref 6–20)
CHLORIDE: 103 mmol/L (ref 101–111)
CO2: 27 mmol/L (ref 22–32)
CREATININE: 1.27 mg/dL — AB (ref 0.44–1.00)
Calcium: 9.4 mg/dL (ref 8.9–10.3)
GFR calc Af Amer: 54 mL/min — ABNORMAL LOW (ref >60)
GFR calc non Af Amer: 47 mL/min — ABNORMAL LOW (ref >60)
Glucose, Bld: 93 mg/dL (ref 65–99)
POTASSIUM: 3.6 mmol/L (ref 3.5–5.1)
SODIUM: 139 mmol/L (ref 135–145)
Total Bilirubin: 0.7 mg/dL (ref 0.3–1.2)
Total Protein: 6.5 g/dL (ref 6.5–8.1)

## 2015-11-25 LAB — GLUCOSE, CAPILLARY: GLUCOSE-CAPILLARY: 76 mg/dL (ref 65–99)

## 2015-11-25 LAB — SURGICAL PCR SCREEN
MRSA, PCR: NEGATIVE
STAPHYLOCOCCUS AUREUS: NEGATIVE

## 2015-11-25 LAB — APTT: aPTT: 28 seconds (ref 24–37)

## 2015-11-25 NOTE — Pre-Procedure Instructions (Signed)
Mary Strong  11/25/2015     Your procedure is scheduled on Wednesday, December 04, 2015 at 12:30 PM.   Report to [facility] Entrance "A" Admitting Office at 10:30 AM.   Call this number if you have problems the morning of surgery: [phone]   Any questions prior to day of surgery, please call [phone] between 8 & 4 PM.   Remember:  Do not eat food or drink liquids after midnight Tuesday, 12/03/15.  Take these medicines the morning of surgery with A SIP OF WATER: Gabapentin (Neurontin), Levothyroxine (Synthroid), Omeprazole (Prilosec), Tamsulosin (Flomax)  Stop NSAIDS (Ibuprofen, Aleve, etc.) and Herbal Medications 7 days prior to surgery. Do not use Aspirin products 7 days prior to surgery.   Do not wear jewelry, make-up or nail polish.  Do not wear lotions, powders, or perfumes.  You may wear deodorant.  Do not shave 48 hours prior to surgery.    Do not bring valuables to the hospital.  [facility] is not responsible for any belongings or valuables.  Contacts, dentures or bridgework may not be worn into surgery.  Leave your suitcase in the car.  After surgery it may be brought to your room.  For patients admitted to the hospital, discharge time will be determined by your treatment team.  Special instructions:  [facility] - Preparing for Surgery  Before surgery, you can play an important role.  Because skin is not sterile, your skin needs to be as free of germs as possible.  You can reduce the number of germs on you skin by washing with CHG (chlorahexidine gluconate) soap before surgery.  CHG is an antiseptic cleaner which kills germs and bonds with the skin to continue killing germs even after washing.  Please DO NOT use if you have an allergy to CHG or antibacterial soaps.  If your skin becomes reddened/irritated stop using the CHG and inform your nurse when you arrive at Short Stay.  Do not shave (including legs and underarms) for at least 48 hours prior to  the first CHG shower.  You may shave your face.  Please follow these instructions carefully:   1.  Shower with CHG Soap the night before surgery and the                                morning of Surgery.  2.  If you choose to wash your hair, wash your hair first as usual with your       normal shampoo.  3.  After you shampoo, rinse your hair and body thoroughly to remove the                      Shampoo.  4.  Use CHG as you would any other liquid soap.  You can apply chg directly       to the skin and wash gently with scrungie or a clean washcloth.  5.  Apply the CHG Soap to your body ONLY FROM THE NECK DOWN.        Do not use on open wounds or open sores.  Avoid contact with your eyes, ears, mouth and genitals (private parts).  Wash genitals (private parts) with your normal soap.  6.  Wash thoroughly, paying special attention to the area where your surgery        will be performed.  7.  Thoroughly rinse your body with  warm water from the neck down.  8.  DO NOT shower/wash with your normal soap after using and rinsing off       the CHG Soap.  9.  Pat yourself dry with a clean towel.            10.  Wear clean pajamas.            11.  Place clean sheets on your bed the night of your first shower and do not        sleep with pets.  Day of Surgery  Do not apply any lotions the morning of surgery.  Please wear clean clothes to the hospital.   Please read over the following fact sheets that you were given. Pain Booklet, Coughing and Deep Breathing, MRSA Information and Surgical Site Infection Prevention

## 2015-11-25 NOTE — Progress Notes (Addendum)
Pt denies cardiac history except for palpitations several years ago. Was instructed to stop caffeine and she did. Hasn't had any since. Pt does have MS. Pt states she will not get her Tysabri injection this month. Pt prefers to spinal anesthesia versus general.

## 2015-12-02 ENCOUNTER — Telehealth: Payer: Self-pay | Admitting: Neurology

## 2015-12-02 NOTE — Telephone Encounter (Signed)
Per Dr. Felecia Shelling, no special instructions for her post-surgical care regarding her MS.  Spoke to patient who is aware of his response.

## 2015-12-02 NOTE — Telephone Encounter (Signed)
Pt is having hip replacement surgery on Wednesday. She wants to know what she needs to be concerned about post-surgery regarding MS. Please call and advise [phone]

## 2015-12-04 ENCOUNTER — Inpatient Hospital Stay (HOSPITAL_COMMUNITY)
Admission: RE | Admit: 2015-12-04 | Discharge: 2015-12-08 | DRG: 470 | Disposition: A | Discharge status: Home / Self Care | Payer: Medicare Other | Source: Ambulatory Visit | Attending: Orthopaedic Surgery | Admitting: Orthopaedic Surgery

## 2015-12-04 ENCOUNTER — Inpatient Hospital Stay (HOSPITAL_COMMUNITY): Payer: Medicare Other

## 2015-12-04 ENCOUNTER — Inpatient Hospital Stay (HOSPITAL_COMMUNITY): Payer: Medicare Other | Admitting: Certified Registered"

## 2015-12-04 ENCOUNTER — Encounter (HOSPITAL_COMMUNITY)
Admission: RE | Disposition: A | Discharge status: Home / Self Care | Payer: Self-pay | Source: Ambulatory Visit | Attending: Orthopaedic Surgery

## 2015-12-04 ENCOUNTER — Encounter (HOSPITAL_COMMUNITY): Payer: Self-pay | Admitting: Anesthesiology

## 2015-12-04 DIAGNOSIS — Z09 Encounter for follow-up examination after completed treatment for conditions other than malignant neoplasm: Secondary | ICD-10-CM

## 2015-12-04 DIAGNOSIS — Z79899 Other long term (current) drug therapy: Secondary | ICD-10-CM | POA: Diagnosis not present

## 2015-12-04 DIAGNOSIS — Z881 Allergy status to other antibiotic agents status: Secondary | ICD-10-CM | POA: Diagnosis not present

## 2015-12-04 DIAGNOSIS — M1711 Unilateral primary osteoarthritis, right knee: Secondary | ICD-10-CM | POA: Diagnosis not present

## 2015-12-04 DIAGNOSIS — M160 Bilateral primary osteoarthritis of hip: Secondary | ICD-10-CM | POA: Diagnosis not present

## 2015-12-04 DIAGNOSIS — E119 Type 2 diabetes mellitus without complications: Secondary | ICD-10-CM | POA: Diagnosis present

## 2015-12-04 DIAGNOSIS — N319 Neuromuscular dysfunction of bladder, unspecified: Secondary | ICD-10-CM | POA: Diagnosis present

## 2015-12-04 DIAGNOSIS — Z888 Allergy status to other drugs, medicaments and biological substances status: Secondary | ICD-10-CM | POA: Diagnosis not present

## 2015-12-04 DIAGNOSIS — I10 Essential (primary) hypertension: Secondary | ICD-10-CM | POA: Diagnosis present

## 2015-12-04 DIAGNOSIS — G801 Spastic diplegic cerebral palsy: Secondary | ICD-10-CM | POA: Diagnosis present

## 2015-12-04 DIAGNOSIS — Z880 Allergy status to penicillin: Secondary | ICD-10-CM

## 2015-12-04 DIAGNOSIS — K219 Gastro-esophageal reflux disease without esophagitis: Secondary | ICD-10-CM | POA: Diagnosis present

## 2015-12-04 DIAGNOSIS — F419 Anxiety disorder, unspecified: Secondary | ICD-10-CM | POA: Diagnosis present

## 2015-12-04 DIAGNOSIS — E039 Hypothyroidism, unspecified: Secondary | ICD-10-CM | POA: Diagnosis present

## 2015-12-04 DIAGNOSIS — M1612 Unilateral primary osteoarthritis, left hip: Secondary | ICD-10-CM | POA: Diagnosis not present

## 2015-12-04 DIAGNOSIS — Z791 Long term (current) use of non-steroidal anti-inflammatories (NSAID): Secondary | ICD-10-CM

## 2015-12-04 DIAGNOSIS — G35 Multiple sclerosis: Secondary | ICD-10-CM | POA: Diagnosis present

## 2015-12-04 DIAGNOSIS — M1611 Unilateral primary osteoarthritis, right hip: Secondary | ICD-10-CM | POA: Diagnosis present

## 2015-12-04 DIAGNOSIS — Z7982 Long term (current) use of aspirin: Secondary | ICD-10-CM | POA: Diagnosis not present

## 2015-12-04 DIAGNOSIS — Z96641 Presence of right artificial hip joint: Secondary | ICD-10-CM | POA: Diagnosis not present

## 2015-12-04 DIAGNOSIS — R509 Fever, unspecified: Secondary | ICD-10-CM

## 2015-12-04 DIAGNOSIS — Z471 Aftercare following joint replacement surgery: Secondary | ICD-10-CM | POA: Diagnosis not present

## 2015-12-04 DIAGNOSIS — D62 Acute posthemorrhagic anemia: Secondary | ICD-10-CM | POA: Diagnosis not present

## 2015-12-04 DIAGNOSIS — Z419 Encounter for procedure for purposes other than remedying health state, unspecified: Secondary | ICD-10-CM

## 2015-12-04 DIAGNOSIS — M169 Osteoarthritis of hip, unspecified: Secondary | ICD-10-CM | POA: Diagnosis not present

## 2015-12-04 HISTORY — DX: Type 2 diabetes mellitus without complications: E11.9

## 2015-12-04 HISTORY — PX: TOTAL HIP ARTHROPLASTY: SHX124

## 2015-12-04 LAB — GLUCOSE, CAPILLARY
GLUCOSE-CAPILLARY: 170 mg/dL — AB (ref 65–99)
GLUCOSE-CAPILLARY: 210 mg/dL — AB (ref 65–99)

## 2015-12-04 SURGERY — ARTHROPLASTY, HIP, TOTAL, ANTERIOR APPROACH
Anesthesia: General | Laterality: Right

## 2015-12-04 MED ORDER — ALBUMIN HUMAN 5 % IV SOLN
INTRAVENOUS | Status: AC
  Filled 2015-12-04: qty 250

## 2015-12-04 MED ORDER — ACETAMINOPHEN 650 MG RE SUPP: 650.0000 mg | Freq: Four times a day (QID) | RECTAL | Status: DC | PRN

## 2015-12-04 MED ORDER — ONDANSETRON HCL 4 MG/2ML IJ SOLN
INTRAMUSCULAR | Status: AC
  Filled 2015-12-04: qty 2

## 2015-12-04 MED ORDER — ONDANSETRON HCL 4 MG PO TABS: 4.0000 mg | ORAL_TABLET | Freq: Four times a day (QID) | ORAL | Status: DC | PRN

## 2015-12-04 MED ORDER — METHOCARBAMOL 500 MG PO TABS
500.0000 mg | ORAL_TABLET | Freq: Four times a day (QID) | ORAL | Status: DC | PRN
  Administered 2015-12-04 – 2015-12-08 (×9): 500 mg via ORAL
  Filled 2015-12-04 (×9): qty 1

## 2015-12-04 MED ORDER — HYDROMORPHONE HCL 1 MG/ML IJ SOLN
0.2500 mg | INTRAMUSCULAR | Status: DC | PRN
  Administered 2015-12-04: 0.5 mg via INTRAVENOUS

## 2015-12-04 MED ORDER — PHENOL 1.4 % MT LIQD: 1.0000 | OROMUCOSAL | Status: DC | PRN

## 2015-12-04 MED ORDER — GABAPENTIN 600 MG PO TABS
600.0000 mg | ORAL_TABLET | Freq: Three times a day (TID) | ORAL | Status: DC
  Administered 2015-12-04 – 2015-12-08 (×11): 600 mg via ORAL
  Filled 2015-12-04 (×11): qty 1

## 2015-12-04 MED ORDER — BISACODYL 10 MG RE SUPP: 10.0000 mg | Freq: Every day | RECTAL | Status: DC | PRN

## 2015-12-04 MED ORDER — DOCUSATE SODIUM 100 MG PO CAPS
100.0000 mg | ORAL_CAPSULE | Freq: Two times a day (BID) | ORAL | Status: DC
  Administered 2015-12-04 – 2015-12-08 (×8): 100 mg via ORAL
  Filled 2015-12-04 (×8): qty 1

## 2015-12-04 MED ORDER — CLINDAMYCIN PHOSPHATE 600 MG/50ML IV SOLN
600.0000 mg | Freq: Three times a day (TID) | INTRAVENOUS | Status: AC
  Administered 2015-12-04 – 2015-12-05 (×2): 600 mg via INTRAVENOUS
  Filled 2015-12-04 (×2): qty 50

## 2015-12-04 MED ORDER — LISINOPRIL-HYDROCHLOROTHIAZIDE 20-12.5 MG PO TABS: 1.0000 | ORAL_TABLET | Freq: Every day | ORAL | Status: DC

## 2015-12-04 MED ORDER — METOCLOPRAMIDE HCL 5 MG/ML IJ SOLN: 5.0000 mg | Freq: Three times a day (TID) | INTRAMUSCULAR | Status: DC | PRN

## 2015-12-04 MED ORDER — PROPOFOL 10 MG/ML IV BOLUS
INTRAVENOUS | Status: DC | PRN
  Administered 2015-12-04: 200 mg via INTRAVENOUS

## 2015-12-04 MED ORDER — LIDOCAINE HCL (CARDIAC) 20 MG/ML IV SOLN
INTRAVENOUS | Status: DC | PRN
  Administered 2015-12-04: 60 mg via INTRAVENOUS

## 2015-12-04 MED ORDER — ACETAMINOPHEN 325 MG PO TABS
650.0000 mg | ORAL_TABLET | Freq: Four times a day (QID) | ORAL | Status: DC | PRN
  Administered 2015-12-05 – 2015-12-06 (×4): 650 mg via ORAL
  Filled 2015-12-04 (×4): qty 2

## 2015-12-04 MED ORDER — DEXTROSE 5 % IV SOLN
500.0000 mg | Freq: Four times a day (QID) | INTRAVENOUS | Status: DC | PRN
  Filled 2015-12-04: qty 5

## 2015-12-04 MED ORDER — LACTATED RINGERS IV SOLN
INTRAVENOUS | Status: DC
  Administered 2015-12-04 (×3): via INTRAVENOUS

## 2015-12-04 MED ORDER — 0.9 % SODIUM CHLORIDE (POUR BTL) OPTIME
TOPICAL | Status: DC | PRN
  Administered 2015-12-04: 1000 mL

## 2015-12-04 MED ORDER — PROPOFOL 10 MG/ML IV BOLUS
INTRAVENOUS | Status: AC
  Filled 2015-12-04: qty 20

## 2015-12-04 MED ORDER — ACETAMINOPHEN 10 MG/ML IV SOLN
1000.0000 mg | Freq: Once | INTRAVENOUS | Status: AC
  Administered 2015-12-04: 1000 mg via INTRAVENOUS

## 2015-12-04 MED ORDER — METOCLOPRAMIDE HCL 5 MG PO TABS: 5.0000 mg | ORAL_TABLET | Freq: Three times a day (TID) | ORAL | Status: DC | PRN

## 2015-12-04 MED ORDER — LACTATED RINGERS IV SOLN: INTRAVENOUS | Status: DC

## 2015-12-04 MED ORDER — DIAZEPAM 5 MG PO TABS: 5.0000 mg | ORAL_TABLET | Freq: Every evening | ORAL | Status: DC | PRN

## 2015-12-04 MED ORDER — MEPERIDINE HCL 25 MG/ML IJ SOLN: 6.2500 mg | INTRAMUSCULAR | Status: DC | PRN

## 2015-12-04 MED ORDER — CLINDAMYCIN PHOSPHATE 900 MG/50ML IV SOLN
900.0000 mg | INTRAVENOUS | Status: AC
  Administered 2015-12-04: 900 mg via INTRAVENOUS

## 2015-12-04 MED ORDER — CLINDAMYCIN PHOSPHATE 900 MG/50ML IV SOLN
INTRAVENOUS | Status: AC
  Filled 2015-12-04: qty 50

## 2015-12-04 MED ORDER — LIDOCAINE 2% (20 MG/ML) 5 ML SYRINGE
INTRAMUSCULAR | Status: AC
  Filled 2015-12-04: qty 5

## 2015-12-04 MED ORDER — FENTANYL CITRATE (PF) 250 MCG/5ML IJ SOLN
INTRAMUSCULAR | Status: AC
  Filled 2015-12-04: qty 5

## 2015-12-04 MED ORDER — MIDAZOLAM HCL 5 MG/5ML IJ SOLN
INTRAMUSCULAR | Status: DC | PRN
  Administered 2015-12-04 (×2): 1 mg via INTRAVENOUS

## 2015-12-04 MED ORDER — HYDROMORPHONE HCL 1 MG/ML IJ SOLN
INTRAMUSCULAR | Status: AC
  Filled 2015-12-04: qty 1

## 2015-12-04 MED ORDER — ONDANSETRON HCL 4 MG/2ML IJ SOLN: 4.0000 mg | Freq: Four times a day (QID) | INTRAMUSCULAR | Status: DC | PRN

## 2015-12-04 MED ORDER — FENTANYL CITRATE (PF) 100 MCG/2ML IJ SOLN
INTRAMUSCULAR | Status: DC | PRN
  Administered 2015-12-04 (×7): 25 ug via INTRAVENOUS
  Administered 2015-12-04: 50 ug via INTRAVENOUS
  Administered 2015-12-04 (×3): 25 ug via INTRAVENOUS

## 2015-12-04 MED ORDER — TAMSULOSIN HCL 0.4 MG PO CAPS
0.4000 mg | ORAL_CAPSULE | Freq: Every day | ORAL | Status: DC
Start: 2015-12-05 — End: 2015-12-08
  Administered 2015-12-05 – 2015-12-08 (×4): 0.4 mg via ORAL
  Filled 2015-12-04 (×4): qty 1

## 2015-12-04 MED ORDER — LEVOTHYROXINE SODIUM 25 MCG PO TABS
137.0000 ug | ORAL_TABLET | Freq: Every day | ORAL | Status: DC
  Administered 2015-12-05 – 2015-12-08 (×4): 137 ug via ORAL
  Filled 2015-12-04 (×4): qty 1

## 2015-12-04 MED ORDER — DULOXETINE HCL 60 MG PO CPEP
60.0000 mg | ORAL_CAPSULE | Freq: Every day | ORAL | Status: DC
  Administered 2015-12-04 – 2015-12-07 (×4): 60 mg via ORAL
  Filled 2015-12-04 (×4): qty 1

## 2015-12-04 MED ORDER — HYDROMORPHONE HCL 1 MG/ML IJ SOLN
1.0000 mg | INTRAMUSCULAR | Status: DC | PRN
  Administered 2015-12-04 – 2015-12-05 (×3): 1 mg via INTRAVENOUS
  Filled 2015-12-04 (×3): qty 1

## 2015-12-04 MED ORDER — MIDAZOLAM HCL 2 MG/2ML IJ SOLN
INTRAMUSCULAR | Status: AC
  Filled 2015-12-04: qty 2

## 2015-12-04 MED ORDER — ALBUMIN HUMAN 5 % IV SOLN
12.5000 g | Freq: Once | INTRAVENOUS | Status: AC
  Administered 2015-12-04: 12.5 g via INTRAVENOUS

## 2015-12-04 MED ORDER — ASPIRIN EC 325 MG PO TBEC
325.0000 mg | DELAYED_RELEASE_TABLET | Freq: Every day | ORAL | Status: DC
  Administered 2015-12-05 – 2015-12-08 (×4): 325 mg via ORAL
  Filled 2015-12-04 (×4): qty 1

## 2015-12-04 MED ORDER — SODIUM CHLORIDE 0.45 % IV SOLN
INTRAVENOUS | Status: DC
  Administered 2015-12-05 – 2015-12-07 (×3): via INTRAVENOUS

## 2015-12-04 MED ORDER — MENTHOL 3 MG MT LOZG
1.0000 | LOZENGE | OROMUCOSAL | Status: DC | PRN
Start: 2015-12-04 — End: 2015-12-08

## 2015-12-04 MED ORDER — POLYETHYLENE GLYCOL 3350 17 G PO PACK: 17.0000 g | PACK | Freq: Every day | ORAL | Status: DC | PRN

## 2015-12-04 MED ORDER — CHLORHEXIDINE GLUCONATE 4 % EX LIQD: 60.0000 mL | Freq: Once | CUTANEOUS | Status: DC

## 2015-12-04 MED ORDER — PHENYLEPHRINE HCL 10 MG/ML IJ SOLN
INTRAMUSCULAR | Status: DC | PRN
  Administered 2015-12-04: 80 ug via INTRAVENOUS

## 2015-12-04 MED ORDER — BIOTIN 5000 MCG PO CAPS: 1.0000 | ORAL_CAPSULE | Freq: Every day | ORAL | Status: DC

## 2015-12-04 MED ORDER — HYDROCHLOROTHIAZIDE 12.5 MG PO CAPS
12.5000 mg | ORAL_CAPSULE | Freq: Every day | ORAL | Status: DC
  Administered 2015-12-06 – 2015-12-08 (×2): 12.5 mg via ORAL
  Filled 2015-12-04 (×2): qty 1

## 2015-12-04 MED ORDER — OXYCODONE HCL 5 MG PO TABS
5.0000 mg | ORAL_TABLET | ORAL | Status: DC | PRN
  Administered 2015-12-04 – 2015-12-06 (×7): 10 mg via ORAL
  Administered 2015-12-06: 5 mg via ORAL
  Administered 2015-12-06 – 2015-12-08 (×10): 10 mg via ORAL
  Filled 2015-12-04 (×9): qty 2
  Filled 2015-12-04: qty 1
  Filled 2015-12-04 (×8): qty 2

## 2015-12-04 MED ORDER — NATALIZUMAB 300 MG/15ML IV CONC
300.0000 mg | INTRAVENOUS | Status: DC
Start: 2015-12-04 — End: 2015-12-06

## 2015-12-04 MED ORDER — ONDANSETRON HCL 4 MG/2ML IJ SOLN
INTRAMUSCULAR | Status: DC | PRN
  Administered 2015-12-04: 4 mg via INTRAVENOUS

## 2015-12-04 MED ORDER — PROMETHAZINE HCL 25 MG/ML IJ SOLN: 6.2500 mg | INTRAMUSCULAR | Status: DC | PRN

## 2015-12-04 MED ORDER — PANTOPRAZOLE SODIUM 40 MG PO TBEC
40.0000 mg | DELAYED_RELEASE_TABLET | Freq: Every day | ORAL | Status: DC
  Administered 2015-12-05 – 2015-12-08 (×4): 40 mg via ORAL
  Filled 2015-12-04 (×4): qty 1

## 2015-12-04 MED ORDER — LISINOPRIL 20 MG PO TABS
20.0000 mg | ORAL_TABLET | Freq: Every day | ORAL | Status: DC
  Administered 2015-12-08: 20 mg via ORAL
  Filled 2015-12-04 (×2): qty 1

## 2015-12-04 MED ORDER — ACETAMINOPHEN 10 MG/ML IV SOLN
INTRAVENOUS | Status: AC
  Filled 2015-12-04: qty 100

## 2015-12-04 SURGICAL SUPPLY — 61 items
ADH SKN CLS APL DERMABOND .7 (GAUZE/BANDAGES/DRESSINGS) ×1
APL SKNCLS STERI-STRIP NONHPOA (GAUZE/BANDAGES/DRESSINGS) ×1
BENZOIN TINCTURE PRP APPL 2/3 (GAUZE/BANDAGES/DRESSINGS) ×2 IMPLANT
BLADE SAW SGTL 18X1.27X75 (BLADE) ×2 IMPLANT
BLADE SAW SGTL 18X1.27X75MM (BLADE) ×1
BLADE SURG ROTATE 9660 (MISCELLANEOUS) IMPLANT
CAPT HIP TOTAL 2 ×2 IMPLANT
CELLS DAT CNTRL 66122 CELL SVR (MISCELLANEOUS) ×1 IMPLANT
COVER SURGICAL LIGHT HANDLE (MISCELLANEOUS) ×3 IMPLANT
DERMABOND ADVANCED (GAUZE/BANDAGES/DRESSINGS) ×2
DERMABOND ADVANCED .7 DNX12 (GAUZE/BANDAGES/DRESSINGS) ×1 IMPLANT
DRAPE C-ARM 42X72 X-RAY (DRAPES) ×3 IMPLANT
DRAPE IMP U-DRAPE 54X76 (DRAPES) ×3 IMPLANT
DRAPE STERI IOBAN 125X83 (DRAPES) ×3 IMPLANT
DRAPE U-SHAPE 47X51 STRL (DRAPES) ×9 IMPLANT
DRSG MEPILEX BORDER 4X8 (GAUZE/BANDAGES/DRESSINGS) ×3 IMPLANT
DURAPREP 26ML APPLICATOR (WOUND CARE) ×3 IMPLANT
ELECT BLADE 4.0 EZ CLEAN MEGAD (MISCELLANEOUS)
ELECT BLADE TIP CTD 4 INCH (ELECTRODE) ×3 IMPLANT
ELECT CAUTERY BLADE 6.4 (BLADE) ×3 IMPLANT
ELECT REM PT RETURN 9FT ADLT (ELECTROSURGICAL) ×3
ELECTRODE BLDE 4.0 EZ CLN MEGD (MISCELLANEOUS) IMPLANT
ELECTRODE REM PT RTRN 9FT ADLT (ELECTROSURGICAL) ×1 IMPLANT
FACESHIELD WRAPAROUND (MASK) ×6 IMPLANT
FACESHIELD WRAPAROUND OR TEAM (MASK) ×2 IMPLANT
GAUZE SPONGE 4X4 12PLY STRL (GAUZE/BANDAGES/DRESSINGS) ×2 IMPLANT
GLOVE BIOGEL PI IND STRL 7.0 (GLOVE) IMPLANT
GLOVE BIOGEL PI IND STRL 8 (GLOVE) ×2 IMPLANT
GLOVE BIOGEL PI INDICATOR 7.0 (GLOVE) ×4
GLOVE BIOGEL PI INDICATOR 8 (GLOVE) ×4
GLOVE ORTHO TXT STRL SZ7.5 (GLOVE) ×6 IMPLANT
GLOVE SURG SS PI 6.5 STRL IVOR (GLOVE) ×4 IMPLANT
GLOVE SURG SS PI 8.0 STRL IVOR (GLOVE) ×4 IMPLANT
GOWN STRL REUS W/ TWL LRG LVL3 (GOWN DISPOSABLE) ×1 IMPLANT
GOWN STRL REUS W/ TWL XL LVL3 (GOWN DISPOSABLE) ×1 IMPLANT
GOWN STRL REUS W/TWL 2XL LVL3 (GOWN DISPOSABLE) ×5 IMPLANT
GOWN STRL REUS W/TWL LRG LVL3 (GOWN DISPOSABLE) ×9
GOWN STRL REUS W/TWL XL LVL3 (GOWN DISPOSABLE) ×3
HOOD PEEL AWAY FLYTE STAYCOOL (MISCELLANEOUS) ×2 IMPLANT
KIT BASIN OR (CUSTOM PROCEDURE TRAY) ×3 IMPLANT
KIT ROOM TURNOVER OR (KITS) ×3 IMPLANT
MANIFOLD NEPTUNE II (INSTRUMENTS) ×3 IMPLANT
NS IRRIG 1000ML POUR BTL (IV SOLUTION) ×3 IMPLANT
PACK TOTAL JOINT (CUSTOM PROCEDURE TRAY) ×3 IMPLANT
PACK UNIVERSAL I (CUSTOM PROCEDURE TRAY) ×1 IMPLANT
PAD ARMBOARD 7.5X6 YLW CONV (MISCELLANEOUS) ×6 IMPLANT
RETRACTOR WND ALEXIS 18 MED (MISCELLANEOUS) ×1 IMPLANT
RTRCTR WOUND ALEXIS 18CM MED (MISCELLANEOUS) ×3
SUT ETHIBOND NAB CT1 #1 30IN (SUTURE) IMPLANT
SUT VIC AB 0 CT1 27 (SUTURE) ×3
SUT VIC AB 0 CT1 27XBRD ANBCTR (SUTURE) ×1 IMPLANT
SUT VIC AB 2-0 CT1 27 (SUTURE) ×3
SUT VIC AB 2-0 CT1 TAPERPNT 27 (SUTURE) ×1 IMPLANT
SUT VICRYL 4-0 PS2 18IN ABS (SUTURE) ×3 IMPLANT
SUT VLOC 180 0 24IN GS25 (SUTURE) ×3 IMPLANT
TAPE STRIPS DRAPE STRL (GAUZE/BANDAGES/DRESSINGS) ×2 IMPLANT
TOWEL OR 17X24 6PK STRL BLUE (TOWEL DISPOSABLE) ×3 IMPLANT
TOWEL OR 17X26 10 PK STRL BLUE (TOWEL DISPOSABLE) ×6 IMPLANT
TRAY FOLEY CATH 16FRSI W/METER (SET/KITS/TRAYS/PACK) ×2 IMPLANT
WATER STERILE IRR 1000ML POUR (IV SOLUTION) ×6 IMPLANT
YANKAUER SUCT BULB TIP NO VENT (SUCTIONS) ×2 IMPLANT

## 2015-12-04 NOTE — Progress Notes (Signed)
Pt arrived to the unit from PACU on bed with IV intact and transfusing. Pt A&O x4; foley intact and unclamped; right hip incision dsg remains clean, dry and intact; pt oriented to the unit and room; VSS; fall/safety precaution and prevention education completed with pt. Pt remains in bed with call light within reach and will closely monitor. Delia Heady RN

## 2015-12-04 NOTE — Op Note (Signed)
NAMEYUNI, SCHWASS             ACCOUNT NO.:  [id number]  MEDICAL RECORD NO.:  [id number]  LOCATION:  5N29C                        FACILITY:  [facility]  PHYSICIAN:  Illyria Sobocinski C. Lorin Mercy, M.D.    DATE OF BIRTH:  Dec 23, 1960  DATE OF PROCEDURE:  12/04/2015 DATE OF DISCHARGE:                              OPERATIVE REPORT   PREOPERATIVE DIAGNOSIS:  Right hip osteoarthritis.  POSTOPERATIVE DIAGNOSIS:  Right hip osteoarthritis.  PROCEDURE:  Right total hip arthroplasty direct anterior approach.  SURGEON:  Avyukt Cimo C. Lorin Mercy, M.D.  ASSISTANT:  Alyson Locket. Ricard Dillon, PA-C, medically necessary and present for the entire procedure.  SECOND ASSIST:  RNFA, Larkin Ina.  ANESTHESIA:  General.  EBL:  1200 mL.  URINE OUTPUT:  200 mL.  COMPONENTS:  Gription 48 mm acetabulum apex hole eliminator, #11 Corail DePuy Press-Fit stem, +1.5 neck length.  No lip polyethylene liner.  A 4 mm offset and a ceramic ball.  DESCRIPTION OF PROCEDURE:  After induction of general anesthesia, Foley catheter insertion to the patient's history of MS with neurogenic bladder.  She was placed on the Hana table after boots were applied with the knees in flexion.  C-arm was brought in confirming good visualization of both hips, 10-15 drapes were applied.  DuraPrep, preoperative Ancef prophylaxis, time-out procedure.  U drapes were applied.  Large shower curtain, Betadine, Steri-Drape were applied. Half sheet across and top sheet.  Skin had been marked for direct anterior approach.  A skin protector was inserted.  Fascia was split, elevated with an Allis clamp.  The interval was identified with the capsule exposed and blunt Cobra placed on each side of the neck.  The fat was removed.  There was coagulation of arterial bleeders.  Soft tissue was removed anteriorly off the neck for visualization and the intertrochanteric line was visualized.  There was small arterial bleeders coming out of the neck.  Neck was cut with the  C-arm visualization 1 fingerbreadth above the lesser trochanter.  Head was removed with the corkscrew.  Acetabulum was sequentially reamed, medialization and then progressing up to a 57 reamer.  This gave good coverage.  Appropriate anteversion was checked.  It was noted that the medial wall had tiny arterial bleeders which continued to bleed.  Cup was inserted and appropriate position checked under fluoroscopy with cup flexion abduction confirmed, impacted with secure fit.  Apex hole eliminator was inserted into the center and operative field was dry. Large hook was placed in the femur.  Leg was taken down.  External rotation under 10 degrees taken underneath.  Hydraulic lift was applied. Mueller retractor was placed medially on the neck and the large curved Hohmann was placed after removing the posterior capsule with exposure of underlying muscle which allowed mobilization laterally.  Rongeur, cookie cutter, and then sequential broaching progressing up with lateralization.  Eleven gave tight fit.  Trials were inserted, checked under the C-arm.  The patient had bilateral hip osteoarthritis and was short bilaterally.  She had been lengthened about 3 mm maybe 4 mm, by x- ray she had good stability, external rotation at 90 degrees trace Shuck. Trials were removed.  Permanent stem was inserted down onto the calcar after calcar reamer and  the Mueller which was medial caused a tiny imprint along the posterior aspect which was well above the trochanteric line.  It was repositioned down on the lesser trochanter and with the stable stem inserted.  The +1.5 ball was inserted after the acetabular liner had been inserted, clipped and impacted with a finishing impactor. The hip was reduced.  Identical stability.  Good restoration of leg length.  She still measured 3 mm longer than the preoperative film, but had bone-on-bone changes in the opposite hip.  She had been restored to correct leg length.   Irrigation with saline solution.  Careful inspection of the entire operative field was dry.  Remaining portion of the fascia was reapproximated with V-Loc suture, 2 on the subcutaneous tissue subcuticular closure.  Marcaine infiltration.  Steri-Strips on the skin.  Postop dressing and transferred to recovery room.  Instrument count and needle count were correct.     Katesha Eichel C. Lorin Mercy, M.D.     MCY/MEDQ  D:  12/04/2015  T:  12/04/2015  Job:  [id number]

## 2015-12-04 NOTE — H&P (Signed)
TOTAL HIP ADMISSION H&P  Patient is admitted for right total hip arthroplasty.  Subjective:  Chief Complaint: right hip pain  HPI: JEWELEAN MALLICOAT, 55 y.o. female, has a history of pain and functional disability in the right hip(s) due to arthritis and patient has failed non-surgical conservative treatments for greater than 12 weeks to include NSAID's and/or analgesics, use of assistive devices, weight reduction as appropriate and activity modification.  Onset of symptoms was gradual starting 10 years ago with gradually worsening course since that time.The patient noted no past surgery on the right hip(s).  Patient currently rates pain in the right hip at 10 out of 10 with activity. Patient has night pain, worsening of pain with activity and weight bearing, trendelenberg gait and pain that interfers with activities of daily living. Patient has evidence of subchondral cysts, subchondral sclerosis, periarticular osteophytes and joint space narrowing by imaging studies. There is no current active infection.  Patient Active Problem List   Diagnosis Date Noted  . High risk medication use 09/03/2014  . Low serum vitamin D 09/03/2014  . Vitamin D deficiency 09/03/2014  . Multiple sclerosis ([place]) 05/31/2014  . Spastic diplegia ([place]) 05/31/2014  . Dysesthesia 05/31/2014  . Chronic fatigue 05/31/2014  . Insomnia 05/31/2014  . Depression 05/31/2014   Past Medical History:  Diagnosis Date  . Anxiety   . Arthritis   . Cancer ([place])    basal cell cancer on forehead, strong family hx of adenocarcinoma  . Constipation   . Depression   . Diabetes mellitus without complication ([place])   . Family history of adverse reaction to anesthesia    Mom's bladder would not wake up, she has MS also.  . Fatty liver   . GERD (gastroesophageal reflux disease)   . Headache    migraines during teen-early 20's  . History of palpitations    none since weight loss  . Hypertension    on Lisinopril to protect  kidneys, was eclamptic with both children, otherwise pt usually has low blood pressure  . Hyperthyroidism    thyroid has been irradiated  . Hypothyroidism   . Memory difficulties    due to MS  . Movement disorder   . Multiple sclerosis ([place])   . Neurogenic bladder   . Neuropathy ([place])   . Palpitations   . Restless leg syndrome   . Sleep apnea    does not use cpap, improved with weight loss  . Vision abnormalities     Past Surgical History:  Procedure Laterality Date  . CESAREAN SECTION     x 2  . CHOLECYSTECTOMY    . TONSILLECTOMY    . TUBAL LIGATION    . uterine ablation      Prescriptions Prior to Admission  Medication Sig Dispense Refill Last Dose  . aspirin 81 MG tablet Take 81 mg by mouth daily.   wk ago at Unknown time  . Biotin 5000 MCG CAPS Take 1 capsule by mouth daily.    wk ago  . diazepam (VALIUM) 5 MG tablet TAKE THREE TABLETS BY MOUTH AT BEDTIME (Patient taking differently: TAKE THREE TABLETS BY MOUTH AT BEDTIME AS NEEDED FOR ANXIETY) 90 tablet 5 Past Week at Unknown time  . DULoxetine (CYMBALTA) 60 MG capsule Take 1 capsule (60 mg total) by mouth at bedtime. 30 capsule 11 12/03/2015 at Unknown time  . gabapentin (NEURONTIN) 600 MG tablet Take 1 tablet (600 mg total) by mouth 3 (three) times daily. 90 tablet 3 12/04/2015 at 0730  .  ibuprofen (ADVIL,MOTRIN) 800 MG tablet Take 800 mg by mouth 2 (two) times daily.   wk ago  . levothyroxine (SYNTHROID, LEVOTHROID) 137 MCG tablet Take 137 mcg by mouth daily.   12/04/2015 at 0730  . lisinopril-hydrochlorothiazide (PRINZIDE,ZESTORETIC) 20-12.5 MG per tablet Take 1 tablet by mouth daily.    12/03/2015 at Unknown time  . mometasone (ELOCON) 0.1 % cream Apply 1 application topically daily as needed (dermatosis).    Past Week at Unknown time  . omeprazole (PRILOSEC) 20 MG capsule Take 20 mg by mouth daily.   12/04/2015 at 0730  . psyllium (METAMUCIL) 58.6 % powder Take 1 packet by mouth daily. Takes a capful   Past Week at Unknown time   . tamsulosin (FLOMAX) 0.4 MG CAPS capsule Take 1 capsule (0.4 mg total) by mouth daily. 30 capsule 11 12/04/2015 at 0730  . fentaNYL (DURAGESIC) 25 MCG/HR patch Place 1 patch (25 mcg total) onto the skin every 3 (three) days. 10 patch 0 12/02/15  . natalizumab (TYSABRI) 300 MG/15ML injection Inject 300 mg into the vein every 30 (thirty) days.   More than a month at Unknown time   Allergies  Allergen Reactions  . Shellfish Allergy Anaphylaxis  . Ciprofloxacin Hives  . Copaxone [Glatiramer Acetate] Hives  . Penicillins Hives    Has patient had a PCN reaction causing immediate rash, facial/tongue/throat swelling, SOB or lightheadedness with hypotension: rash Has patient had a PCN reaction causing severe rash involving mucus membranes or skin necrosis: no Has patient had a PCN reaction that required hospitalization {no Has patient had a PCN reaction occurring within the last 10 years: yes If all of the above answers are "NO", then may proceed with Cephalosporin use.  . Vancomycin Itching    Social History  Substance Use Topics  . Smoking status: Never Smoker  . Smokeless tobacco: Never Used  . Alcohol use 0.0 oz/week     Comment: none for a year    Family History  Problem Relation Age of Onset  . Multiple sclerosis Mother   . Arthritis/Rheumatoid Mother   . Psoriasis Father      Review of Systems  Constitutional: Negative.   HENT: Negative.   Eyes: Negative.   Respiratory: Negative.   Cardiovascular: Negative.   Gastrointestinal: Negative.   Genitourinary: Negative.   Musculoskeletal: Positive for joint pain.  Skin: Negative.   Neurological: Negative.   Psychiatric/Behavioral: Negative.     Objective:  Physical Exam  Constitutional: She is oriented to person, place, and time. She appears well-developed. No distress.  HENT:  Head: Normocephalic.  Eyes: EOM are normal.  Neck: Normal range of motion.  Cardiovascular: Normal rate.   Respiratory: Effort normal.  GI: She  exhibits no distension.  Musculoskeletal: She exhibits tenderness.  Neurological: She is alert and oriented to person, place, and time.  Skin: Skin is warm and dry.  Psychiatric: She has a normal mood and affect.  Musculoskeletal:  She has a tattoo on the forearm.  Fentanyl patch on the right deltoid.  No abdominal tenderness.  She has 10 degrees internal rotation right hip which reproduces her pain that shoots down to the knee.  Knee range of motion is full.  Mild crepitus.  Collateral ligaments are stable.  She has 10 degree hip flexion contracture on the right and left.  Both hips have only 15 degrees external rotation which reproduces her hip pain.   Vital signs in last 24 hours: Temp:  [98.6 F (37 C)] 98.6 F (37  C) (08/02 1056) Pulse Rate:  [89] 89 (08/02 1056) Resp:  [20] 20 (08/02 1056) BP: (107)/(65) 107/65 (08/02 1056) SpO2:  [100 %] 100 % (08/02 1056) Weight:  [83.9 kg (185 lb)] 83.9 kg (185 lb) (08/02 1056)  Labs:   Estimated body mass index is 33.84 kg/m as calculated from the following:   Height as of this encounter: 5\' 2"  (1.575 m).   Weight as of this encounter: 83.9 kg (185 lb).   Imaging Review Plain radiographs demonstrate moderate degenerative joint disease of the right hip(s). The bone quality appears to be good for age and reported activity level.  Assessment/Plan:  End stage arthritis, right hip(s)  The patient history, physical examination, clinical judgement of the provider and imaging studies are consistent with end stage degenerative joint disease of the right hip(s) and total hip arthroplasty is deemed medically necessary. The treatment options including medical management, injection therapy, arthroscopy and arthroplasty were discussed at length. The risks and benefits of total hip arthroplasty were presented and reviewed. The risks due to aseptic loosening, infection, stiffness, dislocation/subluxation,  thromboembolic complications and other  imponderables were discussed.  The patient acknowledged the explanation, agreed to proceed with the plan and consent was signed. Patient is being admitted for inpatient treatment for surgery, pain control, PT, OT, prophylactic antibiotics, VTE prophylaxis, progressive ambulation and ADL's and discharge planning.The patient is planning to be discharged home with home health services

## 2015-12-04 NOTE — Anesthesia Procedure Notes (Signed)
Procedure Name: LMA Insertion Date/Time: 12/04/2015 12:47 PM Performed by: Justice Rocher Pre-anesthesia Checklist: Patient identified, Emergency Drugs available, Suction available and Patient being monitored Patient Re-evaluated:Patient Re-evaluated prior to inductionOxygen Delivery Method: Circle system utilized Preoxygenation: Pre-oxygenation with 100% oxygen Intubation Type: IV induction Ventilation: Mask ventilation without difficulty LMA: LMA inserted LMA Size: 4.0 Number of attempts: 1 Airway Equipment and Method: Bite block Placement Confirmation: positive ETCO2 Tube secured with: Tape Dental Injury: Teeth and Oropharynx as per pre-operative assessment  Comments: Small abrasion on upper lip with LMA placement. Ointment applied w/ mild pressure

## 2015-12-04 NOTE — Anesthesia Preprocedure Evaluation (Addendum)
Anesthesia Evaluation  Patient identified by MRN, date of birth, ID band Patient awake    Reviewed: Allergy & Precautions, NPO status , Patient's Chart, lab work & pertinent test results  Airway Mallampati: II  TM Distance: >3 FB Neck ROM: Full    Dental  (+) Teeth Intact, Dental Advisory Given   Pulmonary sleep apnea ,    breath sounds clear to auscultation       Cardiovascular hypertension, Pt. on medications  Rhythm:Regular Rate:Normal     Neuro/Psych  Headaches, PSYCHIATRIC DISORDERS Anxiety Depression    GI/Hepatic Neg liver ROS, GERD  Medicated,  Endo/Other  diabetesHypothyroidism   Renal/GU negative Renal ROS     Musculoskeletal  (+) Arthritis ,   Abdominal   Peds  Hematology negative hematology ROS (+)   Anesthesia Other Findings - MS  Reproductive/Obstetrics negative OB ROS                            Lab Results  Component Value Date   WBC 6.1 11/25/2015   HGB 11.0 (L) 11/25/2015   HCT 32.9 (L) 11/25/2015   MCV 86.1 11/25/2015   PLT 154 11/25/2015   Lab Results  Component Value Date   CREATININE 1.27 (H) 11/25/2015   BUN 24 (H) 11/25/2015   NA 139 11/25/2015   K 3.6 11/25/2015   CL 103 11/25/2015   CO2 27 11/25/2015   Lab Results  Component Value Date   INR 1.03 11/25/2015   INR 1.08 05/10/2010   11/2015 EKG: normal sinus rhythm.  Anesthesia Physical Anesthesia Plan  ASA: II  Anesthesia Plan: General   Post-op Pain Management:    Induction: Intravenous  Airway Management Planned: LMA  Additional Equipment:   Intra-op Plan:   Post-operative Plan: Extubation in OR  Informed Consent: I have reviewed the patients History and Physical, chart, labs and discussed the procedure including the risks, benefits and alternatives for the proposed anesthesia with the patient or authorized representative who has indicated his/her understanding and acceptance.    Dental advisory given  Plan Discussed with: CRNA  Anesthesia Plan Comments: (Will not perform spinal due to recent bladder issues associated with MS)       Anesthesia Quick Evaluation

## 2015-12-04 NOTE — Interval H&P Note (Signed)
History and Physical Interval Note:  12/04/2015 12:20 PM  Mary Strong  has presented today for surgery, with the diagnosis of Osteoarthritis Right Hip  The various methods of treatment have been discussed with the patient and family. After consideration of risks, benefits and other options for treatment, the patient has consented to  Procedure(s): TOTAL HIP ARTHROPLASTY ANTERIOR APPROACH (Right) as a surgical intervention .  The patient's history has been reviewed, patient examined, no change in status, stable for surgery.  I have reviewed the patient's chart and labs.  Questions were answered to the patient's satisfaction.     Paticia Moster C

## 2015-12-04 NOTE — Transfer of Care (Signed)
Immediate Anesthesia Transfer of Care Note  Patient: Mary Strong  Procedure(s) Performed: Procedure(s): TOTAL HIP ARTHROPLASTY ANTERIOR APPROACH (Right)  Patient Location: PACU  Anesthesia Type:General  Level of Consciousness: sedated  Airway & Oxygen Therapy: Patient Spontanous Breathing and Patient connected to nasal cannula oxygen  Post-op Assessment: Report given to RN, Post -op Vital signs reviewed and stable and Patient moving all extremities X 4  Post vital signs: Reviewed and stable  Last Vitals:  Vitals:   12/04/15 1056  BP: 107/65  Pulse: 89  Resp: 20  Temp: 37 C    Last Pain:  Vitals:   12/04/15 1058  TempSrc:   PainSc: 4       Patients Stated Pain Goal: 4 ([id number])  Complications: No apparent anesthesia complications

## 2015-12-04 NOTE — Brief Op Note (Signed)
12/04/2015  3:10 PM  PATIENT:  Mary Strong  55 y.o. female  PRE-OPERATIVE DIAGNOSIS:  Osteoarthritis Right Hip  POST-OPERATIVE DIAGNOSIS:  Osteoarthritis Right Hip  PROCEDURE:  Procedure(s): TOTAL HIP ARTHROPLASTY ANTERIOR APPROACH (Right)  SURGEON:  Surgeon(s) and Role:    * Marybelle Killings, MD - Primary  PHYSICIAN ASSISTANT: Benjiman Core pa-c    ANESTHESIA:   general  EBL:  Total I/O In: 2700 [I.V.:2700] Out: 1400 [Urine:200; Blood:1200]  BLOOD ADMINISTERED:none  DRAINS: none   LOCAL MEDICATIONS USED:  NONE  SPECIMEN:  No Specimen  DISPOSITION OF SPECIMEN:  N/A  COUNTS:  YES  TOURNIQUET:  * No tourniquets in log *  DICTATION: .Dragon Dictation  PLAN OF CARE: Admit to inpatient   PATIENT DISPOSITION:  PACU - hemodynamically stable.

## 2015-12-05 ENCOUNTER — Encounter (HOSPITAL_COMMUNITY): Payer: Self-pay | Admitting: Orthopaedic Surgery

## 2015-12-05 LAB — GLUCOSE, CAPILLARY
Glucose-Capillary: 103 mg/dL — ABNORMAL HIGH (ref 65–99)
Glucose-Capillary: 131 mg/dL — ABNORMAL HIGH (ref 65–99)
Glucose-Capillary: 113 mg/dL — ABNORMAL HIGH (ref 65–99)

## 2015-12-05 LAB — CBC
HEMATOCRIT: 23.5 % — AB (ref 36.0–46.0)
HEMOGLOBIN: 8 g/dL — AB (ref 12.0–15.0)
MCH: 29.2 pg (ref 26.0–34.0)
MCHC: 34 g/dL (ref 30.0–36.0)
MCV: 85.8 fL (ref 78.0–100.0)
Platelets: 178 10*3/uL (ref 150–400)
RBC: 2.74 MIL/uL — ABNORMAL LOW (ref 3.87–5.11)
RDW: 16.1 % — ABNORMAL HIGH (ref 11.5–15.5)
WBC: 9 10*3/uL (ref 4.0–10.5)

## 2015-12-05 LAB — BASIC METABOLIC PANEL
Anion gap: 6 (ref 5–15)
BUN: 15 mg/dL (ref 6–20)
CHLORIDE: 104 mmol/L (ref 101–111)
CO2: 27 mmol/L (ref 22–32)
CREATININE: 0.92 mg/dL (ref 0.44–1.00)
Calcium: 8.8 mg/dL — ABNORMAL LOW (ref 8.9–10.3)
GFR calc Af Amer: >60 mL/min (ref >60)
GFR calc non Af Amer: >60 mL/min (ref >60)
GLUCOSE: 136 mg/dL — AB (ref 65–99)
Potassium: 4.7 mmol/L (ref 3.5–5.1)
Sodium: 137 mmol/L (ref 135–145)

## 2015-12-05 MED ORDER — WHITE PETROLATUM GEL
Status: AC
  Administered 2015-12-05: 22:00:00
  Filled 2015-12-05: qty 1

## 2015-12-05 NOTE — Care Management Important Message (Signed)
Important Message  Patient Details  Name: Mary Strong MRN: [id number] Date of Birth: [birth year]   Medicare Important Message Given:  Yes    [name], Leroy Sea 12/05/2015, 10:57 AM

## 2015-12-05 NOTE — Progress Notes (Signed)
Physical Therapy Treatment Patient Details Name: BHAVNA LOMAX MRN: [id number] DOB: Jan 30, 1961 Today's Date: 12/05/2015    History of Present Illness pt is a pleasant 55 y/o female s/p TOTAL HIP ARTHROPLASTY ANTERIOR APPROACH (Right). PMH includes Multiple sclerosis, Spastic diplegia, DM, vision abnormalities, chronic fatigue, palpatations, DM, Dysesthesia, restless leg, hypertension    PT Comments    Pt presented supine in bed with HOB elevated, awake and willing to participate in therapy session. Pt making good progress towards achieving her goals. Pt was able to gait train during this session. Pt would continue to benefit from skilled physical therapy services at this time while admitted and after d/c to address her limitations in order to improve her overall safety and independence with functional mobility. PT now recommending pt d/c home with [facility] PT services based on her progress since previous session.    Follow Up Recommendations  Home health PT;Supervision for mobility/OOB     Equipment Recommendations  3in1 (PT)    Recommendations for Other Services       Precautions / Restrictions Precautions Precautions: Fall Restrictions Weight Bearing Restrictions: Yes RLE Weight Bearing: Weight bearing as tolerated    Mobility  Bed Mobility Overal bed mobility: Needs Assistance Bed Mobility: Supine to Sit;Sit to Supine     Supine to sit: Min assist;HOB elevated Sit to supine: Min assist   General bed mobility comments: pt required increased time and min A with R LE movement  Transfers Overall transfer level: Needs assistance Equipment used: Rolling walker (2 wheeled) Transfers: Sit to/from Stand Sit to Stand: Min guard         General transfer comment: pt required increased time  Ambulation/Gait Ambulation/Gait assistance: Min guard Ambulation Distance (Feet): 45 Feet Assistive device: Rolling walker (2 wheeled) Gait Pattern/deviations: Step-through  pattern;Decreased step length - left;Decreased stance time - right;Decreased weight shift to right Gait velocity: decreased Gait velocity interpretation: Below normal speed for age/gender     Stairs            Wheelchair Mobility    Modified Rankin (Stroke Patients Only)       Balance Overall balance assessment: Needs assistance Sitting-balance support: Feet supported;No upper extremity supported Sitting balance-Leahy Scale: Fair     Standing balance support: Single extremity supported;During functional activity Standing balance-Leahy Scale: Poor Standing balance comment: pt relied heavily on RW for stability                    Cognition Arousal/Alertness: Awake/alert Behavior During Therapy: WFL for tasks assessed/performed Overall Cognitive Status: Within Functional Limits for tasks assessed                      Exercises Total Joint Exercises Quad Sets: AROM;Strengthening;Right;5 reps;Supine Long Arc Quad: AROM;Strengthening;Right;10 reps;Seated Bridges: AROM;Strengthening;Both;10 reps;Supine    General Comments        Pertinent Vitals/Pain Pain Assessment: No/denies pain Pain Score: 8  Pain Location: R hip with movement Pain Descriptors / Indicators: Burning;Guarding Pain Intervention(s): Monitored during session    Home Living Family/patient expects to be discharged to:: Private residence Living Arrangements: Spouse/significant other (fiance, daughter) Available Help at Discharge: Family;Available 24 hours/day Type of Home: Mobile home Home Access: Stairs to enter Entrance Stairs-Rails: Left Home Layout: One level Home Equipment: Cane - single point;Wheelchair - [employer] - 4 wheels      Prior Function Level of Independence: Independent with assistive device(s)      Comments: cane in house; assist with LB ADLs  due to difficulty accessing feet   PT Goals (current goals can now be found in the care plan section) Acute Rehab  PT Goals Patient Stated Goal: to decrease her pain PT Goal Formulation: With patient/family Time For Goal Achievement: 12/12/15 Potential to Achieve Goals: Good Progress towards PT goals: Progressing toward goals    Frequency  7X/week    PT Plan Discharge plan needs to be updated    Co-evaluation             End of Session Equipment Utilized During Treatment: Gait belt Activity Tolerance: Patient limited by fatigue;Patient limited by pain Patient left: in bed;with call bell/phone within reach;with family/visitor present     Time: [id number] PT Time Calculation (min) (ACUTE ONLY): 19 min  Charges:  $Gait Training: 8-22 mins                    G CodesClearnce Sorrel Damarion Mendizabal 12-15-2015, 3:45 PM Sherie Don, [place], DPT [phone]

## 2015-12-05 NOTE — Evaluation (Signed)
Physical Therapy Evaluation Patient Details Name: Mary Strong MRN: [id number] DOB: 1960-10-12 Today's Date: 12/05/2015   History of Present Illness  s/p TOTAL HIP ARTHROPLASTY ANTERIOR APPROACH (Right). PMH includes Multiple sclerosis, Spastic diplegia, DM, vision abnormalities, chronic fatigue, palpatations, DM, Dysesthesia, restless leg, hypertension  Clinical Impression  Pt presented supine in bed with HOB elevated, awake and willing to participate in therapy session. Prior to admission, pt reported ambulating with use of a cane. She stated that she required assistance from her husband for bathing and dressing. Pt stated that her functional abilities had declined over the past 3 months and that her husband had to assist her with more than he previously did. Pt was able to stand and march in place with min guard for safety. She then performed SPT to the recliner chair with min guard. Pt would continue to benefit from skilled physical therapy services at this time while admitted and after d/c to address her below listed limitations in order to improve her overall safety and independence with functional mobility.      Follow Up Recommendations SNF;Other (comment) (possibly HH PT depending on progress)    Equipment Recommendations  3in1 (PT)    Recommendations for Other Services       Precautions / Restrictions Precautions Precautions: Fall Restrictions Weight Bearing Restrictions: Yes RLE Weight Bearing: Weight bearing as tolerated      Mobility  Bed Mobility Overal bed mobility: Needs Assistance Bed Mobility: Supine to Sit;Sit to Supine     Supine to sit: Min assist;HOB elevated Sit to supine: Min assist   General bed mobility comments: A to advance RLE onto bed to return to supine.   Transfers Overall transfer level: Needs assistance Equipment used: Rolling walker (2 wheeled) Transfers: Sit to/from Stand Sit to Stand: Min guard Stand pivot transfers: Min guard        General transfer comment: extra time and effort. Pt with difficulty with her L hip as well with surgery planned in a few monthes per pt report.   Ambulation/Gait                Stairs            Wheelchair Mobility    Modified Rankin (Stroke Patients Only)       Balance Overall balance assessment: Needs assistance Sitting-balance support: Feet supported;Bilateral upper extremity supported Sitting balance-Leahy Scale: Fair     Standing balance support: Bilateral upper extremity supported Standing balance-Leahy Scale: Poor Standing balance comment: pt relied heavily on RW for stability                             Pertinent Vitals/Pain Pain Assessment: 0-10 Pain Score: 8  Pain Location: R hip with movement Pain Descriptors / Indicators: Burning;Guarding Pain Intervention(s): Limited activity within patient's tolerance;Monitored during session;Repositioned;Ice applied;RN gave pain meds during session    [place] expects to be discharged to:: Private residence Living Arrangements: Spouse/significant other (fiance, daughter) Available Help at Discharge: Family;Available 24 hours/day Type of Home: Mobile home Home Access: Stairs to enter Entrance Stairs-Rails: Left Entrance Stairs-Number of Steps: 6 to get to deck, then 1 to get into house Home Layout: One level Home Equipment: Cane - single point;Wheelchair - [employer] - 4 wheels      Prior Function Level of Independence: Independent with assistive device(s)         Comments: cane in house; assist with LB ADLs due to difficulty  accessing feet     Hand Dominance        Extremity/Trunk Assessment   Upper Extremity Assessment: Overall WFL for tasks assessed;Generalized weakness           Lower Extremity Assessment: Defer to PT evaluation RLE Deficits / Details: Pt with decreased strength and ROM limitations secondary to post-op. Pt also with diminished  sensation in R foot.       Communication   Communication: No difficulties  Cognition Arousal/Alertness: Awake/alert Behavior During Therapy: WFL for tasks assessed/performed Overall Cognitive Status: Within Functional Limits for tasks assessed                      General Comments      Exercises Total Joint Exercises Ankle Circles/Pumps: AROM;Strengthening;Right;10 reps;Seated Heel Slides: AROM;Strengthening;Right;5 reps;Seated Hip ABduction/ADduction: AROM;Strengthening;Right;5 reps;Seated      Assessment/Plan    PT Assessment Patient needs continued PT services  PT Diagnosis Difficulty walking   PT Problem List Decreased strength;Decreased range of motion;Decreased activity tolerance;Decreased balance;Decreased mobility;Decreased coordination;Pain  PT Treatment Interventions DME instruction;Gait training;Stair training;Functional mobility training;Therapeutic activities;Therapeutic exercise;Balance training;Patient/family education   PT Goals (Current goals can be found in the Care Plan section) Acute Rehab PT Goals Patient Stated Goal: to decrease her pain PT Goal Formulation: With patient/family Time For Goal Achievement: 12/12/15 Potential to Achieve Goals: Good    Frequency 7X/week   Barriers to discharge        Co-evaluation               End of Session Equipment Utilized During Treatment: Gait belt Activity Tolerance: Patient limited by fatigue;Patient limited by pain Patient left: in chair;with call bell/phone within reach;with family/visitor present Nurse Communication: Mobility status         Time: 1115-1140 PT Time Calculation (min) (ACUTE ONLY): 25 min   Charges:   PT Evaluation $PT Eval Moderate Complexity: 1 Procedure PT Treatments $Therapeutic Activity: 8-22 mins   PT G CodesClearnce Sorrel Patte Winkel 12/05/2015, 1:21 PM Sherie Don, [place], DPT [phone]

## 2015-12-05 NOTE — Progress Notes (Signed)
Occupational Therapy Evaluation Patient Details Name: Mary Strong MRN: [id number] DOB: October 14, 1960 Today's Date: 12/05/2015    History of Present Illness s/p TOTAL HIP ARTHROPLASTY ANTERIOR APPROACH (Right). PMH includes Multiple sclerosis, Spastic diplegia, DM, vision abnormalities, chronic fatigue, palpatations, DM, Dysesthesia, restless leg, hypertension   Clinical Impression   Pt admitted with the above diagnoses and presents with below problem list. Pt will benefit from continued acute OT to address the below listed deficits and maximize independence with BADLs prior to d/c to venue below. PTA pt was mod I with mobility, min A for LB ADLs.  Limited eval as pt experiencing postop anemia with some nausea and high pain with movement. Discussed possible need for ST SNF for further rehab prior to d/c home depending on progress. Will follow acutely and update d/c recommendations as indicated.      Follow Up Recommendations  SNF;Supervision/Assistance - 24 hour    Equipment Recommendations  3 in 1 bedside comode;Tub/shower bench    Recommendations for Other Services       Precautions / Restrictions Precautions Precautions: Fall Restrictions Weight Bearing Restrictions: Yes RLE Weight Bearing: Weight bearing as tolerated      Mobility Bed Mobility Overal bed mobility: Needs Assistance Bed Mobility: Supine to Sit;Sit to Supine     Supine to sit: Min assist;HOB elevated Sit to supine: Min assist   General bed mobility comments: A to advance RLE onto bed to return to supine.   Transfers Overall transfer level: Needs assistance Equipment used: Rolling walker (2 wheeled) Transfers: Sit to/from Stand Sit to Stand: Min guard Stand pivot transfers: Min guard       General transfer comment: extra time and effort. Pt with difficulty with her L hip as well with surgery planned in a few monthes per pt report.     Balance Overall balance assessment: Needs  assistance Sitting-balance support: Feet supported;Bilateral upper extremity supported Sitting balance-Leahy Scale: Fair     Standing balance support: Bilateral upper extremity supported Standing balance-Leahy Scale: Poor Standing balance comment: pt relied heavily on RW for stability                            ADL Overall ADL's : Needs assistance/impaired Eating/Feeding: Set up;Sitting   Grooming: Set up;Sitting   Upper Body Bathing: Set up;Sitting   Lower Body Bathing: Moderate assistance;Sit to/from stand   Upper Body Dressing : Set up;Sitting   Lower Body Dressing: Moderate assistance;Sit to/from stand   Toilet Transfer: Minimal assistance;Stand-pivot   Toileting- Clothing Manipulation and Hygiene: Minimal assistance;Sit to/from stand   Tub/ Banker: Moderate assistance;Stand-pivot;Minimal assistance     General ADL Comments: Limited eval as pt experiencing postop anemia with some nausea and high pain with movement. Pt completed bed mobility and took some steps in place by EOB before returning to supine position. Discussed ADLs and d/c plan. Finance present for eval.     Vision     Perception     Praxis      Pertinent Vitals/Pain Pain Assessment: 0-10 Pain Score: 8  Pain Location: R hip with movement Pain Descriptors / Indicators: Burning;Guarding Pain Intervention(s): Limited activity within patient's tolerance;Monitored during session;Repositioned;Ice applied;RN gave pain meds during session     Hand Dominance     Extremity/Trunk Assessment Upper Extremity Assessment Upper Extremity Assessment: Overall WFL for tasks assessed;Generalized weakness   Lower Extremity Assessment Lower Extremity Assessment: Defer to PT evaluation RLE Deficits / Details: Pt with  decreased strength and ROM limitations secondary to post-op. Pt also with diminished sensation in R foot.       Communication Communication Communication: No difficulties    Cognition Arousal/Alertness: Awake/alert Behavior During Therapy: WFL for tasks assessed/performed Overall Cognitive Status: Within Functional Limits for tasks assessed                     General Comments       Exercises Exercises: Total Joint     Shoulder Instructions      Home Living Family/patient expects to be discharged to:: Private residence Living Arrangements: Spouse/significant other (fiance, daughter) Available Help at Discharge: Family;Available 24 hours/day Type of Home: Mobile home Home Access: Stairs to enter Entrance Stairs-Number of Steps: 6 to get to deck, then 1 to get into house Entrance Stairs-Rails: Left Home Layout: One level     Bathroom Shower/Tub: Tub/shower unit;Other (comment) ("very high tub") Shower/tub characteristics: Architectural technologist: Standard Bathroom Accessibility: Yes How Accessible: Accessible via walker Home Equipment: [place] - single point;Wheelchair - [employer] - 4 wheels          Prior Functioning/Environment Level of Independence: Independent with assistive device(s)        Comments: cane in house; assist with LB ADLs due to difficulty accessing feet    OT Diagnosis: Acute pain;Generalized weakness   OT Problem List: Impaired balance (sitting and/or standing);Decreased activity tolerance;Decreased knowledge of use of DME or AE;Decreased knowledge of precautions;Pain   OT Treatment/Interventions: Self-care/ADL training;DME and/or AE instruction;Therapeutic activities;Energy conservation;Patient/family education;Balance training    OT Goals(Current goals can be found in the care plan section) Acute Rehab OT Goals Patient Stated Goal: to decrease her pain OT Goal Formulation: With patient/family Time For Goal Achievement: 12/12/15 Potential to Achieve Goals: Good ADL Goals Pt Will Perform Grooming: standing;with supervision Pt Will Perform Lower Body Bathing: sit to/from stand;with min guard assist;with  adaptive equipment;with min assist Pt Will Perform Lower Body Dressing: with min guard assist;with min assist;with adaptive equipment;sit to/from stand Pt Will Transfer to Toilet: with min guard assist;ambulating Pt Will Perform Toileting - Clothing Manipulation and hygiene: with min guard assist;sit to/from stand Pt Will Perform Tub/Shower Transfer: with min guard assist;tub bench;rolling walker Additional ADL Goal #1: Pt will complete bed mobility at mod I level to prepare for OOB ADLs.   OT Frequency: Min 2X/week   Barriers to D/C:            Co-evaluation              End of Session Equipment Utilized During Treatment: Gait belt;Rolling walker  Activity Tolerance: Patient limited by pain;Other (comment) (nausea) Patient left: in bed;with call bell/phone within reach;with family/visitor present   Time: 1013-1040 OT Time Calculation (min): 27 min Charges:  OT General Charges $OT Visit: 1 Procedure OT Evaluation $OT Eval Low Complexity: 1 Procedure OT Treatments $Self Care/Home Management : 8-22 mins G-Codes:    Hortencia Pilar 12/14/15, 1:16 PM

## 2015-12-05 NOTE — Progress Notes (Signed)
Subjective: 1 Day Post-Op Procedure(s) (LRB): TOTAL HIP ARTHROPLASTY ANTERIOR APPROACH (Right) Patient reports pain as moderate.    Objective: Vital signs in last 24 hours: Temp:  [97.5 F (36.4 C)-99.9 F (37.7 C)] 98.7 F (37.1 C) (08/03 0516) Pulse Rate:  [89-110] 105 (08/03 0516) Resp:  [10-20] 16 (08/03 0516) BP: (92-119)/(50-74) 97/50 (08/03 0516) SpO2:  [95 %-100 %] 99 % (08/03 0516) Weight:  [83.9 kg (185 lb)] 83.9 kg (185 lb) (08/02 1056)  Intake/Output from previous day: 08/02 0701 - 08/03 0700 In: 3790 [I.V.:3690; IV Piggyback:100] Out: 2600 [Urine:1400; Blood:1200] Intake/Output this shift: No intake/output data recorded.   Recent Labs  12/05/15 0334  HGB 8.0*    Recent Labs  12/05/15 0334  WBC 9.0  RBC 2.74*  HCT 23.5*  PLT 178    Recent Labs  12/05/15 0334  NA 137  K 4.7  CL 104  CO2 27  BUN 15  CREATININE 0.92  GLUCOSE 136*  CALCIUM 8.8*   No results for input(s): LABPT, INR in the last 72 hours.  Neurologically intact  Assessment/Plan: 1 Day Post-Op Procedure(s) (LRB): TOTAL HIP ARTHROPLASTY ANTERIOR APPROACH (Right) Up with therapy.   Acute blood loss anemia. Was anemic pre-op at hgb 11. Will leave IV running today. Proceed with therapy.   Cornella Emmer C 12/05/2015, 7:40 AM

## 2015-12-06 ENCOUNTER — Inpatient Hospital Stay (HOSPITAL_COMMUNITY): Payer: Medicare Other

## 2015-12-06 LAB — URINALYSIS, ROUTINE W REFLEX MICROSCOPIC
Bilirubin Urine: NEGATIVE
GLUCOSE, UA: NEGATIVE mg/dL
HGB URINE DIPSTICK: NEGATIVE
Ketones, ur: NEGATIVE mg/dL
Nitrite: NEGATIVE
PH: 6 (ref 5.0–8.0)
PROTEIN: NEGATIVE mg/dL
SPECIFIC GRAVITY, URINE: 1.014 (ref 1.005–1.030)

## 2015-12-06 LAB — CBC
HCT: 19.8 % — ABNORMAL LOW (ref 36.0–46.0)
HEMOGLOBIN: 6.6 g/dL — AB (ref 12.0–15.0)
MCH: 29.2 pg (ref 26.0–34.0)
MCHC: 33.3 g/dL (ref 30.0–36.0)
MCV: 87.6 fL (ref 78.0–100.0)
PLATELETS: 127 10*3/uL — AB (ref 150–400)
RBC: 2.26 MIL/uL — AB (ref 3.87–5.11)
RDW: 16.5 % — ABNORMAL HIGH (ref 11.5–15.5)
WBC: 6.3 10*3/uL (ref 4.0–10.5)

## 2015-12-06 LAB — GLUCOSE, CAPILLARY
GLUCOSE-CAPILLARY: 172 mg/dL — AB (ref 65–99)
GLUCOSE-CAPILLARY: 104 mg/dL — AB (ref 65–99)
GLUCOSE-CAPILLARY: 113 mg/dL — AB (ref 65–99)
Glucose-Capillary: 106 mg/dL — ABNORMAL HIGH (ref 65–99)

## 2015-12-06 LAB — ABO/RH: ABO/RH(D): A POS

## 2015-12-06 LAB — URINE MICROSCOPIC-ADD ON: RBC / HPF: NONE SEEN RBC/hpf (ref 0–5)

## 2015-12-06 LAB — PREPARE RBC (CROSSMATCH)

## 2015-12-06 MED ORDER — SODIUM CHLORIDE 0.9 % IV SOLN: Freq: Once | INTRAVENOUS | Status: DC

## 2015-12-06 NOTE — Progress Notes (Signed)
Subjective: Pain right hip controlled.  C/o feeling fatigued, weak.    Objective: Vital signs in last 24 hours: Temp:  [98.7 F (37.1 C)-102.3 F (39.1 C)] 99.2 F (37.3 C) (08/04 1020) Pulse Rate:  [100-121] 104 (08/04 1020) Resp:  [16-18] 16 (08/04 1020) BP: (84-105)/(42-64) 84/42 (08/04 1020) SpO2:  [95 %-100 %] 99 % (08/04 1020)  Intake/Output from previous day: 08/03 0701 - 08/04 0700 In: 1596 [P.O.:1596] Out: 50 [Urine:50] Intake/Output this shift: Total I/O In: 335 [Blood:335] Out: -    Recent Labs  12/05/15 0334 12/06/15 0244  HGB 8.0* 6.6*    Recent Labs  12/05/15 0334 12/06/15 0244  WBC 9.0 6.3  RBC 2.74* 2.26*  HCT 23.5* 19.8*  PLT 178 127*    Recent Labs  12/05/15 0334  NA 137  K 4.7  CL 104  CO2 27  BUN 15  CREATININE 0.92  GLUCOSE 136*  CALCIUM 8.8*   No results for input(s): LABPT, INR in the last 72 hours.  Exam:  Patient looks lethargic.  No respiratory distress.  Wound looks good.  Steri strips intact.  No drainage or signs of infection.  bilat calves nontender.  NVI.   Assessment/Plan: -ABLA.  Will transfusing 2 units PRBC's.  -fever.  Will get CXR.  Question atelectasis.  Encourage incentive spirometry.  -anticipate d/c home over the weekend.    Odalys Win M 12/06/2015, 10:32 AM

## 2015-12-06 NOTE — Progress Notes (Signed)
Patient temp this morning 102.3 orally. Temp rechecked rectally showed 102.3. PRN tylenol given, pt instructed on using IS and cool wet cloth applied on patient. Temp rechecked came down to 100.0 orally. Will report off to on coming RN and notify MD on rounds.

## 2015-12-06 NOTE — Progress Notes (Signed)
PT Cancellation Note  Patient Details Name: Mary Strong MRN: [id number] DOB: [birth year]   Cancelled Treatment:    Reason Eval/Treat Not Completed: Patient declined, no reason specified. Pt reported having a fever all night last night and stated that the plan was to give her a blood transfusion this AM. PT will continue to f/u with pt as appropriate.   Clearnce Sorrel Greer Wainright 12/06/2015, 8:42 AM Sherie Don, PT, DPT [phone]

## 2015-12-06 NOTE — Progress Notes (Signed)
PT Cancellation Note  Patient Details Name: Mary Strong MRN: [id number] DOB: 03/05/1961   Cancelled Treatment:    Reason Eval/Treat Not Completed: Medical issues which prohibited therapy. Pt's nurse stated that pt was to receive another unit of blood and requested that therapy be deferred until tomorrow. PT will continue to f/u with pt as appropriate.   Clearnce Sorrel Audwin Semper 12/06/2015, 1:04 PM Sherie Don, [place], DPT [phone]

## 2015-12-06 NOTE — Progress Notes (Signed)
CRITICAL VALUE ALERT  Critical value received:  hgb 6.6  Date of notification:  12/06/2015  Time of notification:  0510  Critical value read back:Yes.    Nurse who received alert:  Quamaine Webb  MD notified (1st page):  XU,MD  Time of first page:  0525  MD notified (2nd page):  Time of second page:  Responding MD:  Rolene Course  Time MD responded:  [phone]

## 2015-12-07 LAB — TYPE AND SCREEN
ABO/RH(D): A POS
ANTIBODY SCREEN: NEGATIVE
Unit division: 0
Unit division: 0

## 2015-12-07 LAB — CBC
HCT: 24.8 % — ABNORMAL LOW (ref 36.0–46.0)
Hemoglobin: 8.6 g/dL — ABNORMAL LOW (ref 12.0–15.0)
MCH: 30.1 pg (ref 26.0–34.0)
MCHC: 34.7 g/dL (ref 30.0–36.0)
MCV: 86.7 fL (ref 78.0–100.0)
PLATELETS: 122 10*3/uL — AB (ref 150–400)
RBC: 2.86 MIL/uL — ABNORMAL LOW (ref 3.87–5.11)
RDW: 15.4 % (ref 11.5–15.5)
WBC: 7.6 10*3/uL (ref 4.0–10.5)

## 2015-12-07 LAB — URINE CULTURE

## 2015-12-07 LAB — GLUCOSE, CAPILLARY
GLUCOSE-CAPILLARY: 118 mg/dL — AB (ref 65–99)
GLUCOSE-CAPILLARY: 127 mg/dL — AB (ref 65–99)
GLUCOSE-CAPILLARY: 111 mg/dL — AB (ref 65–99)
Glucose-Capillary: 102 mg/dL — ABNORMAL HIGH (ref 65–99)

## 2015-12-07 NOTE — Progress Notes (Addendum)
Subjective: 3 Days Post-Op Procedure(s) (LRB): TOTAL HIP ARTHROPLASTY ANTERIOR APPROACH (Right) Patient reports pain as moderate and severe.    Objective: Vital signs in last 24 hours: Temp:  [98.4 F (36.9 C)-99.9 F (37.7 C)] 98.4 F (36.9 C) (08/05 0348) Pulse Rate:  [102-115] 102 (08/05 1000) Resp:  [16-18] 18 (08/05 0348) BP: (96-107)/(55-64) 102/64 (08/05 1000) SpO2:  [95 %-100 %] 99 % (08/05 1000)  Intake/Output from previous day: 08/04 0701 - 08/05 0700 In: 1995 [I.V.:990; Blood:1005] Out: -  Intake/Output this shift: No intake/output data recorded.   Recent Labs  12/05/15 0334 12/06/15 0244 12/07/15 0417  HGB 8.0* 6.6* 8.6*    Recent Labs  12/06/15 0244 12/07/15 0417  WBC 6.3 7.6  RBC 2.26* 2.86*  HCT 19.8* 24.8*  PLT 127* 122*    Recent Labs  12/05/15 0334  NA 137  K 4.7  CL 104  CO2 27  BUN 15  CREATININE 0.92  GLUCOSE 136*  CALCIUM 8.8*   No results for input(s): LABPT, INR in the last 72 hours.  Neurologically intact  Assessment/Plan: 3 Days Post-Op Procedure(s) (LRB): TOTAL HIP ARTHROPLASTY ANTERIOR APPROACH (Right) Up with therapy   She has " burning pain " in hip. hgb OK after transfusion 8.6. Not moving well enough to go home.  [facility] [place] Coral Timme C 12/07/2015, 11:03 AM

## 2015-12-07 NOTE — Progress Notes (Signed)
Physical Therapy Treatment Patient Details Name: Mary Strong MRN: [id number] DOB: 1960-06-15 Today's Date: 12/07/2015    History of Present Illness pt is a pleasant 55 y/o female s/p TOTAL HIP ARTHROPLASTY ANTERIOR APPROACH (Right). PMH includes Multiple sclerosis, Spastic diplegia, DM, vision abnormalities, chronic fatigue, palpatations, DM, Dysesthesia, restless leg, hypertension    PT Comments    Pt was greatly limited with mobility/gait today due to burning pain in Right lateral thigh down to anterior knee.  If pain better controlled may be able to attempt steps during pm session.    Follow Up Recommendations  Home health PT;Supervision for mobility/OOB     Equipment Recommendations  3in1 (PT)    Recommendations for Other Services       Precautions / Restrictions Precautions Precautions: Fall Restrictions Weight Bearing Restrictions: Yes RLE Weight Bearing: Weight bearing as tolerated    Mobility  Bed Mobility Overal bed mobility: Needs Assistance Bed Mobility: Supine to Sit     Supine to sit: HOB elevated;Supervision (used rail)        Transfers Overall transfer level: Needs assistance Equipment used: Rolling walker (2 wheeled) Transfers: Sit to/from Stand Sit to Stand: Min guard  Used BSC during session         General transfer comment: Needs cues for safest and easist technique  Ambulation/Gait Ambulation/Gait assistance: Min guard Ambulation Distance (Feet): 20 Feet Assistive device: Rolling walker (2 wheeled) Gait Pattern/deviations: Step-to pattern;Decreased stride length Gait velocity: decreased       Stairs Stairs:  (not able to attempt due to pain)          Wheelchair Mobility    Modified Rankin (Stroke Patients Only)       Balance             Standing balance-Leahy Scale: Poor Standing balance comment: required RW for balance                    Cognition Arousal/Alertness: Awake/alert Behavior During  Therapy: WFL for tasks assessed/performed Overall Cognitive Status: Within Functional Limits for tasks assessed                      Exercises Total Joint Exercises Ankle Circles/Pumps: AROM;Both;10 reps;Seated  Did not perform more exercises due to intense pain    General Comments General comments (skin integrity, edema, etc.): no family present      Pertinent Vitals/Pain Pain Assessment: 0-10 Pain Score: 8  Pain Location: Lateral right thigh moving towards anterior knee Pain Descriptors / Indicators: Burning Pain Intervention(s): Limited activity within patient's tolerance;Monitored during session    Home Living                      Prior Function            PT Goals (current goals can now be found in the care plan section) Progress towards PT goals: Progressing toward goals    Frequency  7X/week    PT Plan Discharge plan needs to be updated    Co-evaluation             End of Session Equipment Utilized During Treatment: Gait belt Activity Tolerance: Patient limited by pain Patient left: with call bell/phone within reach;in chair     Time: [id number] PT Time Calculation (min) (ACUTE ONLY): 20 min  Charges:  $Gait Training: 8-22 mins  G Codes:      Melvern Banker 25-Dec-2015, 10:23 AM  Lavonia Dana, PT  [phone] 2015-12-25

## 2015-12-07 NOTE — Progress Notes (Signed)
Physical Therapy Treatment Patient Details Name: Mary Strong MRN: [id number] DOB: [birth year] Today's Date: 12/07/2015    History of Present Illness pt is a pleasant 55 y/o female s/p TOTAL HIP ARTHROPLASTY ANTERIOR APPROACH (Right). PMH includes Multiple sclerosis, Spastic diplegia, DM, vision abnormalities, chronic fatigue, palpatations, DM, Dysesthesia, restless leg, hypertension    PT Comments    Patient is progressing toward mobility goals. Tolerated increased ambulation and stair training this session, however, continues to be limited by burning pain in R thigh. Patient needs to practice stairs and review HEP next session.     Follow Up Recommendations  Home health PT;Supervision for mobility/OOB     Equipment Recommendations  3in1 (PT)    Recommendations for Other Services       Precautions / Restrictions Precautions Precautions: Fall Restrictions Weight Bearing Restrictions: Yes RLE Weight Bearing: Weight bearing as tolerated    Mobility  Bed Mobility Overal bed mobility: Needs Assistance Bed Mobility: Supine to Sit     Supine to sit: Supervision (used rail)     General bed mobility comments: cues for technique with pt "hooking" L foot under R ankle to assist with mobilizing R LE; increased time and use of rail; HOB flat  Transfers Overall transfer level: Needs assistance Equipment used: Rolling walker (2 wheeled) Transfers: Sit to/from Stand Sit to Stand: Min guard         General transfer comment: cues for hand placement and technique with carry over demonstrated throughout session; X1 from EOB and 3 in 1 and X2 from recliner  Ambulation/Gait Ambulation/Gait assistance: Min guard Ambulation Distance (Feet): 50 Feet Assistive device: Rolling walker (2 wheeled) Gait Pattern/deviations: Step-through pattern;Decreased stance time - right;Decreased step length - left;Decreased weight shift to right;Trunk flexed Gait velocity: decreased   General Gait  Details: very slow, steady gait; cues for sequencing, bilat heel strike, and posture; pt with improved WB and step length symmetry with increased distance   Stairs Stairs: Yes Stairs assistance: Min assist Stair Management: One rail Left;With cane Number of Stairs: 2 General stair comments: increased pain with descending steps; min A especially to descend; practiced with L hand rail and SPC as pt is used to managing stairs with cane; therapist demonstrated backward with RW; b/f present and actively participating  Wheelchair Mobility    Modified Rankin (Stroke Patients Only)       Balance Overall balance assessment: Needs assistance   Sitting balance-Leahy Scale: Good       Standing balance-Leahy Scale: Poor                      Cognition Arousal/Alertness: Awake/alert Behavior During Therapy: WFL for tasks assessed/performed Overall Cognitive Status: Within Functional Limits for tasks assessed                      Exercises      General Comments        Pertinent Vitals/Pain Pain Assessment: 0-10 Pain Score: 5  Pain Location: R thigh Pain Descriptors / Indicators: Burning Pain Intervention(s): Limited activity within patient's tolerance;Monitored during session;Premedicated before session;Repositioned    Home Living                      Prior Function            PT Goals (current goals can now be found in the care plan section) Acute Rehab PT Goals Patient Stated Goal: go home Progress towards PT goals: Progressing  toward goals    Frequency  7X/week    PT Plan Current plan remains appropriate    Co-evaluation             End of Session Equipment Utilized During Treatment: Gait belt Activity Tolerance: Patient limited by pain Patient left: with call bell/phone within reach;in chair;in bed;with family/visitor present     Time: 1310-1357 PT Time Calculation (min) (ACUTE ONLY): 47 min  Charges:  $Gait Training: 23-37  mins $Therapeutic Activity: 8-22 mins                    G Codes:      Salina April, PTA Pager: [phone]   12/07/2015, 2:59 PM

## 2015-12-08 DIAGNOSIS — D62 Acute posthemorrhagic anemia: Secondary | ICD-10-CM

## 2015-12-08 LAB — GLUCOSE, CAPILLARY
Glucose-Capillary: 109 mg/dL — ABNORMAL HIGH (ref 65–99)
Glucose-Capillary: 144 mg/dL — ABNORMAL HIGH (ref 65–99)

## 2015-12-08 MED ORDER — OXYCODONE-ACETAMINOPHEN 5-325 MG PO TABS: 1.0000 | ORAL_TABLET | Freq: Four times a day (QID) | ORAL | 0 refills | Status: DC | PRN

## 2015-12-08 NOTE — Progress Notes (Signed)
Physical Therapy Treatment Patient Details Name: Mary Strong MRN: [id number] DOB: 1961-01-30 Today's Date: 12/08/2015    History of Present Illness pt is a pleasant 55 y/o female s/p TOTAL HIP ARTHROPLASTY ANTERIOR APPROACH (Right). PMH includes Multiple sclerosis, Spastic diplegia, DM, vision abnormalities, chronic fatigue, palpatations, DM, Dysesthesia, restless leg, hypertension    PT Comments    Pt and fiance feel ready and safe for DC today.  I have answered all patient's question regarding PT and mobility.   I have encouraged the patient to gradually increase activity daily to tolerance.      Follow Up Recommendations  Home health PT;Supervision for mobility/OOB     Equipment Recommendations  3in1 (PT)    Recommendations for Other Services       Precautions / Restrictions Precautions Precautions: Fall Restrictions Weight Bearing Restrictions: Yes RLE Weight Bearing: Weight bearing as tolerated    Mobility  Bed Mobility Overal bed mobility: Needs Assistance Bed Mobility: Supine to Sit     Supine to sit: Supervision (used rail) Sit to supine: Supervision   General bed mobility comments: increased time and effort required   Transfers Overall transfer level: Needs assistance Equipment used: Rolling walker (2 wheeled) Transfers: Sit to/from Stand Sit to Stand: Supervision         General transfer comment: demonstrates proper technique   Ambulation/Gait Ambulation/Gait assistance: Supervision Ambulation Distance (Feet): 30 Feet Assistive device: Rolling walker (2 wheeled) Gait Pattern/deviations: Step-to pattern;Decreased stride length Gait velocity: decreased Gait velocity interpretation: Below normal speed for age/gender General Gait Details: slow, steady gait   Stairs Stairs:  (pt able to verbalize correct technique)       General stair comments: Pt did not want to practiceas stairs again today. Limited gait distance due to likely DC home  today  Wheelchair Mobility    Modified Rankin (Stroke Patients Only)       Balance     Sitting balance-Leahy Scale: Good       Standing balance-Leahy Scale: Poor Standing balance comment: requires RW                    Cognition Arousal/Alertness: Awake/alert Behavior During Therapy: WFL for tasks assessed/performed Overall Cognitive Status: Within Functional Limits for tasks assessed                      Exercises Total Joint Exercises Ankle Circles/Pumps: AROM;Both;10 reps;Supine Heel Slides: AAROM;Right;10 reps;Supine Hip ABduction/ADduction: AAROM;Right;10 reps;Supine    General Comments General comments (skin integrity, edema, etc.): Claudia Pollock present for entire session with pts permission. Pt used BSC over toilet during session      Pertinent Vitals/Pain Pain Assessment: 0-10 Pain Score: 2  Pain Location: r thigh, primarily lateral Pain Descriptors / Indicators: Burning Pain Intervention(s): Limited activity within patient's tolerance;Monitored during session;Premedicated before session    Home Living                      Prior Function            PT Goals (current goals can now be found in the care plan section) Acute Rehab PT Goals Patient Stated Goal: go home today Progress towards PT goals: Progressing toward goals    Frequency  7X/week    PT Plan Current plan remains appropriate    Co-evaluation             End of Session   Activity Tolerance: Patient tolerated treatment well Patient left:  with call bell/phone within reach;in bed;with family/visitor present     Time: [id number] PT Time Calculation (min) (ACUTE ONLY): 28 min  Charges:  $Gait Training: 8-22 mins $Therapeutic Exercise: 8-22 mins                    G Codes:      Melvern Banker 01/06/16, 12:11 PM  Lavonia Dana, PT  [phone] 01/06/2016

## 2015-12-08 NOTE — Progress Notes (Signed)
Patient discharged to home, discharge instructions given, patient stated she understood 

## 2015-12-08 NOTE — Progress Notes (Signed)
Occupational Therapy Treatment Patient Details Name: Mary Strong MRN: [id number] DOB: 1961/02/23 Today's Date: 12/08/2015    History of present illness pt is a pleasant 55 y/o female s/p TOTAL HIP ARTHROPLASTY ANTERIOR APPROACH (Right). PMH includes Multiple sclerosis, Spastic diplegia, DM, vision abnormalities, chronic fatigue, palpatations, DM, Dysesthesia, restless leg, hypertension   OT comments  Pt. Was S with supine to sit  EOB. Pt. Was ed on use of AE for LE ADLs. Pt. Deferred to return demo secondary would have family to A. Pt. Was ed on use of DME for toilet and shower. Pt. Was S with sit to stand and stand to sit from commode with cues for proper hand placement. Pt. Was ed on use of 3-1 for commode transfer at home secondary to pt. Having a low commode. Pt. Was ed on use of shower seat at home. Pt. Stated she was going to stand in shower. Pt. Deferred walking to gym to practive tub transfer and it was simulated in room to demo technique.   Follow Up Recommendations  Home health OT    Equipment Recommendations       Recommendations for Other Services      Precautions / Restrictions Precautions Precautions: Fall Restrictions Weight Bearing Restrictions: Yes RLE Weight Bearing: Weight bearing as tolerated       Mobility Bed Mobility   Bed Mobility: Supine to Sit;Sit to Supine     Supine to sit: Supervision Sit to supine: Supervision   General bed mobility comments: cues for technique with pt "hooking" L foot under R ankle to assist with mobilizing R LE; increased time and use of rail; HOB flat  Transfers Overall transfer level: Needs assistance Equipment used: Rolling walker (2 wheeled) Transfers: Sit to/from Stand Sit to Stand: Supervision         General transfer comment: cues for hand placement and technique with carry over demonstrated throughout session    Balance                                   ADL                            Toilet Transfer: Supervision/safety   Toileting- Clothing Manipulation and Hygiene: Supervision/safety         General ADL Comments: Pt. was ed on use of AE for LE  ADLs but deferred return demo. Pt. did not want to go to gym to perform tub transfer and was demo in room.       Vision                     Perception     Praxis      Cognition   Behavior During Therapy: WFL for tasks assessed/performed Overall Cognitive Status: Within Functional Limits for tasks assessed                       Extremity/Trunk Assessment               Exercises     Shoulder Instructions       General Comments      Pertinent Vitals/ Pain       Pain Assessment: 0-10 Pain Score: 5  Pain Location: R thigh Pain Descriptors / Indicators: Burning Pain Intervention(s): Limited activity within patient's tolerance  Home Living  Prior Functioning/Environment              Frequency       Progress Toward Goals  OT Goals(current goals can now be found in the care plan section)  Progress towards OT goals: Progressing toward goals  Acute Rehab OT Goals Patient Stated Goal: go home today.  Plan Discharge plan remains appropriate    Co-evaluation                 End of Session Equipment Utilized During Treatment: Rolling walker   Activity Tolerance Patient tolerated treatment well   Patient Left in bed;with call bell/phone within reach;with family/visitor present   Nurse Communication          Time: [id number] OT Time Calculation (min): 44 min  Charges: OT General Charges $OT Visit: 1 Procedure OT Treatments $Self Care/Home Management : 38-52 mins  Saxton Chain 12/08/2015, 10:47 AM

## 2015-12-08 NOTE — Progress Notes (Signed)
Subjective: 4 Days Post-Op Procedure(s) (LRB): TOTAL HIP ARTHROPLASTY ANTERIOR APPROACH (Right) Patient reports pain as mild.    Objective: Vital signs in last 24 hours: Temp:  [99.5 F (37.5 C)-101 F (38.3 C)] 99.5 F (37.5 C) (08/06 0354) Pulse Rate:  [97-103] 97 (08/06 0854) Resp:  [18] 18 (08/06 0354) BP: (95-110)/(52-60) 101/60 (08/06 0854) SpO2:  [97 %-100 %] 97 % (08/06 0354)  Intake/Output from previous day: No intake/output data recorded. Intake/Output this shift: No intake/output data recorded.   Recent Labs  12/06/15 0244 12/07/15 0417  HGB 6.6* 8.6*    Recent Labs  12/06/15 0244 12/07/15 0417  WBC 6.3 7.6  RBC 2.26* 2.86*  HCT 19.8* 24.8*  PLT 127* 122*   No results for input(s): NA, K, CL, CO2, BUN, CREATININE, GLUCOSE, CALCIUM in the last 72 hours. No results for input(s): LABPT, INR in the last 72 hours.  Neurologically intact  Assessment/Plan: 4 Days Post-Op Procedure(s) (LRB): TOTAL HIP ARTHROPLASTY ANTERIOR APPROACH (Right) Up with therapy discharge home.  Mary Strong C 12/08/2015, 12:31 PM

## 2015-12-09 ENCOUNTER — Other Ambulatory Visit: Payer: Self-pay | Admitting: Neurology

## 2015-12-09 NOTE — Anesthesia Postprocedure Evaluation (Signed)
Anesthesia Post Note  Patient: Mary Strong  Procedure(s) Performed: Procedure(s) (LRB): TOTAL HIP ARTHROPLASTY ANTERIOR APPROACH (Right)  Patient location during evaluation: PACU Anesthesia Type: General Level of consciousness: awake and alert Pain management: pain level controlled Vital Signs Assessment: post-procedure vital signs reviewed and stable Respiratory status: spontaneous breathing, nonlabored ventilation, respiratory function stable and patient connected to nasal cannula oxygen Cardiovascular status: blood pressure returned to baseline and stable Postop Assessment: no signs of nausea or vomiting Anesthetic complications: no    Last Vitals:  Vitals:   12/08/15 0354 12/08/15 0854  BP: (!) 95/53 101/60  Pulse: (!) 102 97  Resp: 18   Temp: 37.5 C     Last Pain:  Vitals:   12/08/15 1111  TempSrc:   PainSc: Ness Tameshia [place]

## 2015-12-09 NOTE — Telephone Encounter (Signed)
Pt called said she's had hip replacement and was released from the  hospital yesterday. She is needing to pick up 2 other medications today and was wanting to pick this one up at the same time. If you  need to call her she can be reached at [phone]

## 2015-12-19 DIAGNOSIS — M1611 Unilateral primary osteoarthritis, right hip: Secondary | ICD-10-CM | POA: Diagnosis not present

## 2015-12-26 ENCOUNTER — Telehealth: Payer: Self-pay | Admitting: *Deleted

## 2015-12-26 DIAGNOSIS — G35 Multiple sclerosis: Secondary | ICD-10-CM | POA: Diagnosis not present

## 2015-12-26 MED ORDER — FENTANYL 25 MCG/HR TD PT72: 25.0000 ug | MEDICATED_PATCH | TRANSDERMAL | 0 refills | Status: DC

## 2015-12-26 NOTE — Telephone Encounter (Signed)
Fentanyl rx. provided at infusion appt/fim

## 2016-01-02 ENCOUNTER — Telehealth: Payer: Self-pay | Admitting: Neurology

## 2016-01-02 NOTE — Discharge Summary (Signed)
Patient ID: Mary Strong MRN: [id number] DOB/AGE: 1960/10/22 55 y.o.  Admit date: 12/04/2015 Discharge date: 01/02/2016  Admission Diagnoses:  Active Problems:   Osteoarthritis of right hip   Acute blood loss anemia   Discharge Diagnoses:  Active Problems:   Osteoarthritis of right hip   Acute blood loss anemia  status post Procedure(s): TOTAL HIP ARTHROPLASTY ANTERIOR APPROACH  Past Medical History:  Diagnosis Date  . Anxiety   . Arthritis   . Cancer ([place])    basal cell cancer on forehead, strong family hx of adenocarcinoma  . Constipation   . Depression   . Family history of adverse reaction to anesthesia    Mom's bladder would not wake up, she has MS also.  . Fatty liver   . GERD (gastroesophageal reflux disease)   . Headache    migraines during teen-early 20's  . History of palpitations    none since weight loss  . Hypertension    on Lisinopril to protect kidneys, was eclamptic with both children, otherwise pt usually has low blood pressure  . Hyperthyroidism    thyroid has been irradiated  . Hypothyroidism   . Memory difficulties    due to MS  . Movement disorder   . Multiple sclerosis ([place])   . Neurogenic bladder   . Neuropathy ([place])   . Palpitations   . Restless leg syndrome   . Sleep apnea    does not use cpap, improved with weight loss  . Type 2 diabetes, diet controlled ([place])   . Vision abnormalities     Surgeries: Procedure(s): TOTAL HIP ARTHROPLASTY ANTERIOR APPROACH on 12/04/2015   Consultants:   Discharged Condition: Improved  Hospital Course: Mary Strong is an 55 y.o. female who was admitted 12/04/2015 for operative treatment of hip djd. Patient failed conservative treatments (please see the history and physical for the specifics) and had severe unremitting pain that affects sleep, daily activities and work/hobbies. After pre-op clearance, the patient was taken to the operating room on 12/04/2015 and underwent  Procedure(s): [place].    Patient was given perioperative antibiotics:  Anti-infectives    Start     Dose/Rate Route Frequency Ordered Stop   12/04/15 2030  clindamycin (CLEOCIN) IVPB 600 mg     600 mg 100 mL/hr over 30 Minutes Intravenous Every 8 hours 12/04/15 1734 12/05/15 0546   12/04/15 1215  clindamycin (CLEOCIN) IVPB 900 mg     900 mg 100 mL/hr over 30 Minutes Intravenous On call to O.R. 12/04/15 1039 12/04/15 1321   12/04/15 1053  clindamycin (CLEOCIN) 900 MG/50ML IVPB    Comments:  Mary Strong   : cabinet override      12/04/15 1053 12/04/15 2259       Patient was given sequential compression devices and early ambulation to prevent DVT.   Patient benefited maximally from hospital stay and there were no complications. At the time of discharge, the patient was urinating/moving their bowels without difficulty, tolerating a regular diet, pain is controlled with oral pain medications and they have been cleared by PT/OT.   Recent vital signs: No data found.    Recent laboratory studies: No results for input(s): WBC, HGB, HCT, PLT, NA, K, CL, CO2, BUN, CREATININE, GLUCOSE, INR, CALCIUM in the last 72 hours.  Invalid input(s): PT, 2   Discharge Medications:     Medication List    TAKE these medications   aspirin 81 MG tablet Take 81 mg by  mouth daily.   Biotin 5000 MCG Caps Take 1 capsule by mouth daily.   diazepam 5 MG tablet Commonly known as:  VALIUM TAKE THREE TABLETS BY MOUTH AT BEDTIME What changed:  See the new instructions.   DULoxetine 60 MG capsule Commonly known as:  CYMBALTA Take 1 capsule (60 mg total) by mouth at bedtime.   ibuprofen 800 MG tablet Commonly known as:  ADVIL,MOTRIN Take 800 mg by mouth 2 (two) times daily.   levothyroxine 137 MCG tablet Commonly known as:  SYNTHROID, LEVOTHROID Take 137 mcg by mouth daily.   lisinopril-hydrochlorothiazide 20-12.5 MG tablet Commonly known as:  PRINZIDE,ZESTORETIC Take 1 tablet by  mouth daily.   mometasone 0.1 % cream Commonly known as:  ELOCON Apply 1 application topically daily as needed (dermatosis).   natalizumab 300 MG/15ML injection Commonly known as:  TYSABRI Inject 300 mg into the vein every 30 (thirty) days.   omeprazole 20 MG capsule Commonly known as:  PRILOSEC Take 20 mg by mouth daily.   oxyCODONE-acetaminophen 5-325 MG tablet Commonly known as:  ROXICET Take 1-2 tablets by mouth every 6 (six) hours as needed for severe pain.   psyllium 58.6 % powder Commonly known as:  METAMUCIL Take 1 packet by mouth daily. Takes a capful   tamsulosin 0.4 MG Caps capsule Commonly known as:  FLOMAX Take 1 capsule (0.4 mg total) by mouth daily.       Diagnostic Studies: Dg Pelvis Portable  Result Date: 12/04/2015 CLINICAL DATA:  55 year old female status post right total hip arthroplasty. Initial encounter. EXAM: PORTABLE PELVIS 1-2 VIEWS COMPARISON:  Intraoperative images from 1415 hours today. CT Abdomen and Pelvis 11/27/2014. FINDINGS: Portable AP supine view at 1604 hours. Bipolar appearing right total hip arthroplasty. Hardware appears intact, and normally aligned in the AP view. No unexpected osseous changes. Moderate to severe left hip osteoarthritis. IMPRESSION: Right hip arthroplasty with no adverse features. Left hip osteoarthritis. Electronically Signed   By: Genevie Ann M.D.   On: 12/04/2015 16:13   Dg Chest Port 1 View  Result Date: 12/06/2015 CLINICAL DATA:  Fever EXAM: PORTABLE CHEST 1 VIEW COMPARISON:  11/25/2015 FINDINGS: Low lung volumes. Heart and mediastinal contours are within normal limits. No focal opacities or effusions. No acute bony abnormality. IMPRESSION: No active disease. Electronically Signed   By: Rolm Baptise M.D.   On: 12/06/2015 11:01   Dg C-arm 1-60 Min  Result Date: 12/04/2015 CLINICAL DATA:  Right hip replacement EXAM: OPERATIVE right HIP (WITH PELVIS IF PERFORMED) 2 VIEWS TECHNIQUE: Fluoroscopic spot image(s) were submitted  for interpretation post-operatively. COMPARISON:  None. FINDINGS: 34 seconds of fluoroscopy was utilized. Right hip replacement is noted. No acute bony or soft tissue abnormality is seen. IMPRESSION: Status post right hip replacement. Electronically Signed   By: Inez Catalina M.D.   On: 12/04/2015 15:27   Dg Hip Operative Unilat W Or W/o Pelvis Right  Result Date: 12/04/2015 CLINICAL DATA:  Right hip replacement EXAM: OPERATIVE right HIP (WITH PELVIS IF PERFORMED) 2 VIEWS TECHNIQUE: Fluoroscopic spot image(s) were submitted for interpretation post-operatively. COMPARISON:  None. FINDINGS: 34 seconds of fluoroscopy was utilized. Right hip replacement is noted. No acute bony or soft tissue abnormality is seen. IMPRESSION: Status post right hip replacement. Electronically Signed   By: Inez Catalina M.D.   On: 12/04/2015 15:27        Discharge Plan:  discharge to home  Disposition:     Signed: Lanae Crumbly for  Elta Guadeloupe yates md 01/02/2016, 9:59 AM

## 2016-01-02 NOTE — Telephone Encounter (Signed)
[name] called back to advise they have not rec'd a checklist since March and to please send them

## 2016-01-02 NOTE — Telephone Encounter (Signed)
Message printed and given to Tina in the infusion suite/fim 

## 2016-01-02 NOTE — Telephone Encounter (Signed)
LMOM (identified vm) that pt. continues to receive monthly Tysabri/fim

## 2016-01-02 NOTE — Telephone Encounter (Signed)
[name] [phone] ext [phone] called regarding Tysabri, last information received was in March, was informed by patient that patient has been receiving infusions every month, [name] requests confirmation of this (April, May, June and August). Please call to advise.

## 2016-01-08 ENCOUNTER — Encounter: Payer: Self-pay | Admitting: *Deleted

## 2016-01-15 ENCOUNTER — Other Ambulatory Visit: Payer: Self-pay | Admitting: Orthopaedic Surgery

## 2016-01-16 ENCOUNTER — Telehealth: Payer: Self-pay | Admitting: Neurology

## 2016-01-16 ENCOUNTER — Other Ambulatory Visit: Payer: Self-pay | Admitting: *Deleted

## 2016-01-16 DIAGNOSIS — G894 Chronic pain syndrome: Secondary | ICD-10-CM

## 2016-01-16 MED ORDER — FENTANYL 25 MCG/HR TD PT72: 25.0000 ug | MEDICATED_PATCH | TRANSDERMAL | 0 refills | Status: DC

## 2016-01-16 NOTE — Telephone Encounter (Signed)
Rx's up front [facility].  UDS ordered, to comply with state guidelines/fim

## 2016-01-16 NOTE — Telephone Encounter (Signed)
Patient requesting refill of fentaNYL (DURAGESIC) 25 MCG/HR patch Pharmacy: pick up

## 2016-01-17 ENCOUNTER — Encounter (HOSPITAL_COMMUNITY): Payer: Self-pay

## 2016-01-17 ENCOUNTER — Encounter (HOSPITAL_COMMUNITY)
Admission: RE | Admit: 2016-01-17 | Discharge: 2016-01-17 | Disposition: A | Discharge status: Home / Self Care | Payer: Medicare Other | Source: Ambulatory Visit | Attending: Orthopaedic Surgery | Admitting: Orthopaedic Surgery

## 2016-01-17 DIAGNOSIS — F329 Major depressive disorder, single episode, unspecified: Secondary | ICD-10-CM | POA: Insufficient documentation

## 2016-01-17 DIAGNOSIS — E119 Type 2 diabetes mellitus without complications: Secondary | ICD-10-CM | POA: Diagnosis not present

## 2016-01-17 DIAGNOSIS — G4733 Obstructive sleep apnea (adult) (pediatric): Secondary | ICD-10-CM | POA: Diagnosis not present

## 2016-01-17 DIAGNOSIS — I10 Essential (primary) hypertension: Secondary | ICD-10-CM | POA: Diagnosis not present

## 2016-01-17 DIAGNOSIS — E039 Hypothyroidism, unspecified: Secondary | ICD-10-CM | POA: Diagnosis not present

## 2016-01-17 DIAGNOSIS — M1712 Unilateral primary osteoarthritis, left knee: Secondary | ICD-10-CM | POA: Diagnosis not present

## 2016-01-17 DIAGNOSIS — G35 Multiple sclerosis: Secondary | ICD-10-CM | POA: Insufficient documentation

## 2016-01-17 DIAGNOSIS — K219 Gastro-esophageal reflux disease without esophagitis: Secondary | ICD-10-CM | POA: Insufficient documentation

## 2016-01-17 DIAGNOSIS — N319 Neuromuscular dysfunction of bladder, unspecified: Secondary | ICD-10-CM | POA: Insufficient documentation

## 2016-01-17 DIAGNOSIS — Z01812 Encounter for preprocedural laboratory examination: Secondary | ICD-10-CM | POA: Diagnosis not present

## 2016-01-17 HISTORY — DX: Cardiac arrhythmia, unspecified: I49.9

## 2016-01-17 HISTORY — DX: Adverse effect of unspecified anesthetic, initial encounter: T41.45XA

## 2016-01-17 LAB — APTT: aPTT: 27 s (ref 24–36)

## 2016-01-17 LAB — CBC
HCT: 33.2 % — ABNORMAL LOW (ref 36.0–46.0)
HEMOGLOBIN: 10.8 g/dL — AB (ref 12.0–15.0)
MCH: 28.1 pg (ref 26.0–34.0)
MCHC: 32.5 g/dL (ref 30.0–36.0)
MCV: 86.2 fL (ref 78.0–100.0)
PLATELETS: 179 10*3/uL (ref 150–400)
RBC: 3.85 MIL/uL — AB (ref 3.87–5.11)
RDW: 15.8 % — ABNORMAL HIGH (ref 11.5–15.5)
WBC: 6.3 10*3/uL (ref 4.0–10.5)

## 2016-01-17 LAB — COMPREHENSIVE METABOLIC PANEL
ALT: 23 U/L (ref 14–54)
AST: 29 U/L (ref 15–41)
Albumin: 3.9 g/dL (ref 3.5–5.0)
Alkaline Phosphatase: 70 U/L (ref 38–126)
Anion gap: 8 (ref 5–15)
BUN: 15 mg/dL (ref 6–20)
CHLORIDE: 105 mmol/L (ref 101–111)
CO2: 26 mmol/L (ref 22–32)
CREATININE: 1.03 mg/dL — AB (ref 0.44–1.00)
Calcium: 9.6 mg/dL (ref 8.9–10.3)
GFR calc Af Amer: >60 mL/min (ref >60)
GFR calc non Af Amer: >60 mL/min (ref >60)
Glucose, Bld: 110 mg/dL — ABNORMAL HIGH (ref 65–99)
Potassium: 4 mmol/L (ref 3.5–5.1)
SODIUM: 139 mmol/L (ref 135–145)
Total Bilirubin: 0.4 mg/dL (ref 0.3–1.2)
Total Protein: 6.5 g/dL (ref 6.5–8.1)

## 2016-01-17 LAB — URINE MICROSCOPIC-ADD ON

## 2016-01-17 LAB — URINALYSIS, ROUTINE W REFLEX MICROSCOPIC
GLUCOSE, UA: NEGATIVE mg/dL
Hgb urine dipstick: NEGATIVE
Ketones, ur: NEGATIVE mg/dL
Nitrite: NEGATIVE
PH: 5.5 (ref 5.0–8.0)
Protein, ur: NEGATIVE mg/dL
SPECIFIC GRAVITY, URINE: 1.023 (ref 1.005–1.030)

## 2016-01-17 LAB — PROTIME-INR
INR: 1
Prothrombin Time: 13.2 seconds (ref 11.4–15.2)

## 2016-01-17 LAB — SURGICAL PCR SCREEN
MRSA, PCR: NEGATIVE
Staphylococcus aureus: NEGATIVE

## 2016-01-17 NOTE — Pre-Procedure Instructions (Addendum)
Mary Strong  01/17/2016      [facility] [address], [place] - [address], [address] [place], [place] [address] Phone: [phone] Fax: [phone]  [place], [place] [id number] [place] [address] Phone: [phone] Fax: [phone]    Your procedure is scheduled on 01/24/16.  Report to [facility] Admitting at 530 A.M.  Call this number if you have problems the morning of surgery:  [phone]   Remember:  Do not eat food or drink liquids after midnight.  Take these medicines the morning of surgery with A SIP OF WATER duloxitine(cymbalta),fentanyl(duragesic),gabapentin,levothyroxine,omeprazole,  STOP all herbel meds, nsaids (aleve,naproxen,advil,ibuprofen) 5 days prior to surgery starting today(01/17/16) including vitamins, aspirin   Do not wear jewelry, make-up or nail polish.  Do not wear lotions, powders, or perfumes, or deoderant.  Do not shave 48 hours prior to surgery.  Men may shave face and neck.  Do not bring valuables to the hospital.  [facility] is not responsible for any belongings or valuables.  Contacts, dentures or bridgework may not be worn into surgery.  Leave your suitcase in the car.  After surgery it may be brought to your room.  For patients admitted to the hospital, discharge time will be determined by your treatment team.  Patients discharged the day of surgery will not be allowed to drive home.   Name and phone number of your driver:    Special instructions:  Special Instructions: Mary Strong - Preparing for Surgery  Before surgery, you can play an important role.  Because skin is not sterile, your skin needs to be as free of germs as possible.  You can reduce the number of germs on you skin by washing with CHG (chlorahexidine gluconate) soap before surgery.  CHG is an antiseptic cleaner which kills germs and bonds with the skin to continue killing  germs even after washing.  Please DO NOT use if you have an allergy to CHG or antibacterial soaps.  If your skin becomes reddened/irritated stop using the CHG and inform your nurse when you arrive at Short Stay.  Do not shave (including legs and underarms) for at least 48 hours prior to the first CHG shower.  You may shave your face.  Please follow these instructions carefully:   1.  Shower with CHG Soap the night before surgery and the morning of Surgery.  2.  If you choose to wash your hair, wash your hair first as usual with your normal shampoo.  3.  After you shampoo, rinse your hair and body thoroughly to remove the Shampoo.  4.  Use CHG as you would any other liquid soap.  You can apply chg directly  to the skin and wash gently with scrungie or a clean washcloth.  5.  Apply the CHG Soap to your body ONLY FROM THE NECK DOWN.  Do not use on open wounds or open sores.  Avoid contact with your eyes ears, mouth and genitals (private parts).  Wash genitals (private parts)       with your normal soap.  6.  Wash thoroughly, paying special attention to the area where your surgery will be performed.  7.  Thoroughly rinse your body with warm water from the neck down.  8.  DO NOT shower/wash with your normal soap after using and rinsing off the CHG Soap.  9.  Pat yourself dry with a clean  towel.            10.  Wear clean pajamas.            11.  Place clean sheets on your bed the night of your first shower and do not sleep with pets.  Day of Surgery  Do not apply any lotions/deodorants the morning of surgery.  Please wear clean clothes to the hospital/surgery center.  Please read over the  fact sheets that you were given.

## 2016-01-18 LAB — HEMOGLOBIN A1C
Hgb A1c MFr Bld: 5.2 % (ref 4.8–5.6)
MEAN PLASMA GLUCOSE: 103 mg/dL

## 2016-01-20 NOTE — Progress Notes (Signed)
Anesthesia chart review: Patient is a 55 year old female scheduled for left total hip arthroplasty on 01/24/2016 by Dr. Lorin Mercy. She is s/p right THA 12/04/15 (GA/LMA used; spinal not done at that time due to "recent bladder issues associated with MS.")  History includes multiple sclerosis, never smoker, palpitations, hypertension, memory difficulties, DM2 (diet controlled), hypothyroidism (s/p irradiation for hyperthyroidism), skin cancer, anxiety, depression, neurogenic bladder, GERD, migraines, RLS, fatty liver, OSA (improved after weight loss; no CPAP), cholecystectomy, tonsillectomy.   PCP is Dr. Gar Ponto. Neurologist is Dr. Arlice Colt. She is not currently being followed by a cardiologist, but saw Dr. Acie Fredrickson for palpitations during 05/2010 hospitalization.   Meds include aspirin 81 mg, Valium when necessary, Cymbalta, Duragesic, levothyroxine, lisinopril-HCTZ, Tysabri, omeprazole, Flomax.  BP 102/62   Pulse 100   Temp 36.9 C   Resp 20   Ht 5\' 2"  (1.575 m)   Wt 185 lb 3.2 oz (84 kg)   SpO2 100%   BMI 33.87 kg/m   11/25/15 EKG: NSR.  05/11/10 Echo: Study Conclusions - Left ventricle: The cavity size was normal. Wall thickness was  increased in a pattern of mild LVH. There was mild focal basal  hypertrophy of the septum. Systolic function was normal. The  estimated ejection fraction was in the range of 60% to 65%. Wall  motion was normal; there were no regional wall motion  abnormalities. Left ventricular diastolic function parameters were  normal. - Left atrium: The atrium was mildly dilated. - Pulmonary arteries: Systolic pressure was mildly increased. PA  peak pressure: 20mm Hg (S).  12/06/15 1V CXR: IMPRESSION: No active disease.  Preoperative labs noted. H&H 10.8/33.2. Creatinine 1.03. A1c 5.2.  She tolerated similar procedure on the right just over a month ago, if no acute changes then I anticipate that she can proceed as planned.  George Hugh [facility] Short Stay Center/Anesthesiology Phone [phone] 01/20/2016 3:21 PM

## 2016-01-23 MED ORDER — CLINDAMYCIN PHOSPHATE 900 MG/50ML IV SOLN
900.0000 mg | INTRAVENOUS | Status: AC
  Administered 2016-01-24: 900 mg via INTRAVENOUS
  Filled 2016-01-23: qty 50

## 2016-01-24 ENCOUNTER — Inpatient Hospital Stay (HOSPITAL_COMMUNITY): Payer: Medicare Other | Admitting: Certified Registered Nurse Anesthetist

## 2016-01-24 ENCOUNTER — Inpatient Hospital Stay (HOSPITAL_COMMUNITY): Payer: Medicare Other

## 2016-01-24 ENCOUNTER — Encounter (HOSPITAL_COMMUNITY): Payer: Self-pay | Admitting: Certified Registered Nurse Anesthetist

## 2016-01-24 ENCOUNTER — Inpatient Hospital Stay (HOSPITAL_COMMUNITY): Payer: Medicare Other | Admitting: Emergency Medicine

## 2016-01-24 ENCOUNTER — Inpatient Hospital Stay (HOSPITAL_COMMUNITY)
Admission: RE | Admit: 2016-01-24 | Discharge: 2016-01-28 | DRG: 470 | Disposition: A | Discharge status: Home / Self Care | Payer: Medicare Other | Source: Ambulatory Visit | Attending: Orthopaedic Surgery | Admitting: Orthopaedic Surgery

## 2016-01-24 ENCOUNTER — Encounter (HOSPITAL_COMMUNITY)
Admission: RE | Disposition: A | Discharge status: Home / Self Care | Payer: Self-pay | Source: Ambulatory Visit | Attending: Orthopaedic Surgery

## 2016-01-24 DIAGNOSIS — Z419 Encounter for procedure for purposes other than remedying health state, unspecified: Secondary | ICD-10-CM

## 2016-01-24 DIAGNOSIS — G4730 Sleep apnea, unspecified: Secondary | ICD-10-CM | POA: Diagnosis present

## 2016-01-24 DIAGNOSIS — Z7982 Long term (current) use of aspirin: Secondary | ICD-10-CM

## 2016-01-24 DIAGNOSIS — R5382 Chronic fatigue, unspecified: Secondary | ICD-10-CM | POA: Diagnosis present

## 2016-01-24 DIAGNOSIS — I10 Essential (primary) hypertension: Secondary | ICD-10-CM | POA: Diagnosis present

## 2016-01-24 DIAGNOSIS — E890 Postprocedural hypothyroidism: Secondary | ICD-10-CM | POA: Diagnosis not present

## 2016-01-24 DIAGNOSIS — M1712 Unilateral primary osteoarthritis, left knee: Secondary | ICD-10-CM | POA: Diagnosis not present

## 2016-01-24 DIAGNOSIS — Z888 Allergy status to other drugs, medicaments and biological substances status: Secondary | ICD-10-CM | POA: Diagnosis not present

## 2016-01-24 DIAGNOSIS — Z96641 Presence of right artificial hip joint: Secondary | ICD-10-CM | POA: Diagnosis present

## 2016-01-24 DIAGNOSIS — Z91048 Other nonmedicinal substance allergy status: Secondary | ICD-10-CM

## 2016-01-24 DIAGNOSIS — K219 Gastro-esophageal reflux disease without esophagitis: Secondary | ICD-10-CM | POA: Diagnosis present

## 2016-01-24 DIAGNOSIS — Z8489 Family history of other specified conditions: Secondary | ICD-10-CM | POA: Diagnosis not present

## 2016-01-24 DIAGNOSIS — Z471 Aftercare following joint replacement surgery: Secondary | ICD-10-CM | POA: Diagnosis not present

## 2016-01-24 DIAGNOSIS — R2681 Unsteadiness on feet: Secondary | ICD-10-CM

## 2016-01-24 DIAGNOSIS — D62 Acute posthemorrhagic anemia: Secondary | ICD-10-CM | POA: Diagnosis not present

## 2016-01-24 DIAGNOSIS — E119 Type 2 diabetes mellitus without complications: Secondary | ICD-10-CM | POA: Diagnosis present

## 2016-01-24 DIAGNOSIS — Z79899 Other long term (current) drug therapy: Secondary | ICD-10-CM | POA: Diagnosis not present

## 2016-01-24 DIAGNOSIS — R509 Fever, unspecified: Secondary | ICD-10-CM | POA: Diagnosis not present

## 2016-01-24 DIAGNOSIS — R0602 Shortness of breath: Secondary | ICD-10-CM | POA: Diagnosis not present

## 2016-01-24 DIAGNOSIS — Z8261 Family history of arthritis: Secondary | ICD-10-CM

## 2016-01-24 DIAGNOSIS — Z880 Allergy status to penicillin: Secondary | ICD-10-CM

## 2016-01-24 DIAGNOSIS — E0590 Thyrotoxicosis, unspecified without thyrotoxic crisis or storm: Secondary | ICD-10-CM | POA: Diagnosis present

## 2016-01-24 DIAGNOSIS — Z09 Encounter for follow-up examination after completed treatment for conditions other than malignant neoplasm: Secondary | ICD-10-CM

## 2016-01-24 DIAGNOSIS — G35 Multiple sclerosis: Secondary | ICD-10-CM | POA: Diagnosis present

## 2016-01-24 DIAGNOSIS — I959 Hypotension, unspecified: Secondary | ICD-10-CM

## 2016-01-24 DIAGNOSIS — M1612 Unilateral primary osteoarthritis, left hip: Principal | ICD-10-CM | POA: Diagnosis present

## 2016-01-24 DIAGNOSIS — Z96642 Presence of left artificial hip joint: Secondary | ICD-10-CM | POA: Diagnosis not present

## 2016-01-24 DIAGNOSIS — Z79891 Long term (current) use of opiate analgesic: Secondary | ICD-10-CM

## 2016-01-24 DIAGNOSIS — M25552 Pain in left hip: Secondary | ICD-10-CM | POA: Diagnosis not present

## 2016-01-24 DIAGNOSIS — M169 Osteoarthritis of hip, unspecified: Secondary | ICD-10-CM | POA: Diagnosis not present

## 2016-01-24 DIAGNOSIS — E039 Hypothyroidism, unspecified: Secondary | ICD-10-CM | POA: Diagnosis present

## 2016-01-24 HISTORY — PX: TOTAL HIP ARTHROPLASTY: SHX124

## 2016-01-24 LAB — GLUCOSE, CAPILLARY
Glucose-Capillary: 152 mg/dL — ABNORMAL HIGH (ref 65–99)
Glucose-Capillary: 163 mg/dL — ABNORMAL HIGH (ref 65–99)

## 2016-01-24 SURGERY — ARTHROPLASTY, HIP, TOTAL, ANTERIOR APPROACH
Anesthesia: General | Site: Hip | Laterality: Left

## 2016-01-24 MED ORDER — KETAMINE HCL 10 MG/ML IJ SOLN
INTRAMUSCULAR | Status: DC | PRN
  Administered 2016-01-24: 20 mg via INTRAVENOUS

## 2016-01-24 MED ORDER — DULOXETINE HCL 60 MG PO CPEP
60.0000 mg | ORAL_CAPSULE | Freq: Every day | ORAL | Status: DC
  Administered 2016-01-24 – 2016-01-27 (×4): 60 mg via ORAL
  Filled 2016-01-24 (×4): qty 1

## 2016-01-24 MED ORDER — PROPOFOL 10 MG/ML IV BOLUS
INTRAVENOUS | Status: AC
  Filled 2016-01-24: qty 20

## 2016-01-24 MED ORDER — GLYCOPYRROLATE 0.2 MG/ML IV SOSY
PREFILLED_SYRINGE | INTRAVENOUS | Status: AC
  Filled 2016-01-24: qty 3

## 2016-01-24 MED ORDER — FENTANYL CITRATE (PF) 100 MCG/2ML IJ SOLN
25.0000 ug | INTRAMUSCULAR | Status: DC | PRN
  Administered 2016-01-24: 25 ug via INTRAVENOUS

## 2016-01-24 MED ORDER — HYDROMORPHONE HCL 1 MG/ML IJ SOLN
1.0000 mg | INTRAMUSCULAR | Status: DC | PRN
  Administered 2016-01-24 – 2016-01-25 (×4): 1 mg via INTRAVENOUS
  Filled 2016-01-24 (×4): qty 1

## 2016-01-24 MED ORDER — FENTANYL CITRATE (PF) 100 MCG/2ML IJ SOLN
INTRAMUSCULAR | Status: AC
  Filled 2016-01-24: qty 2

## 2016-01-24 MED ORDER — KETAMINE HCL-SODIUM CHLORIDE 100-0.9 MG/10ML-% IV SOSY
PREFILLED_SYRINGE | INTRAVENOUS | Status: AC
  Filled 2016-01-24: qty 10

## 2016-01-24 MED ORDER — DIAZEPAM 5 MG PO TABS
5.0000 mg | ORAL_TABLET | Freq: Every evening | ORAL | Status: DC | PRN
  Administered 2016-01-25: 5 mg via ORAL
  Filled 2016-01-24: qty 1

## 2016-01-24 MED ORDER — BUPIVACAINE HCL (PF) 0.5 % IJ SOLN
INTRAMUSCULAR | Status: AC
  Filled 2016-01-24: qty 30

## 2016-01-24 MED ORDER — MIDAZOLAM HCL 2 MG/2ML IJ SOLN
INTRAMUSCULAR | Status: DC | PRN
  Administered 2016-01-24 (×2): 1 mg via INTRAVENOUS

## 2016-01-24 MED ORDER — ONDANSETRON HCL 4 MG/2ML IJ SOLN: 4.0000 mg | Freq: Four times a day (QID) | INTRAMUSCULAR | Status: DC | PRN

## 2016-01-24 MED ORDER — ONDANSETRON HCL 4 MG PO TABS
4.0000 mg | ORAL_TABLET | Freq: Four times a day (QID) | ORAL | Status: DC | PRN
  Administered 2016-01-26: 4 mg via ORAL
  Filled 2016-01-24: qty 1

## 2016-01-24 MED ORDER — TAMSULOSIN HCL 0.4 MG PO CAPS
0.4000 mg | ORAL_CAPSULE | Freq: Every day | ORAL | Status: DC
  Administered 2016-01-24 – 2016-01-28 (×4): 0.4 mg via ORAL
  Filled 2016-01-24 (×4): qty 1

## 2016-01-24 MED ORDER — PROPOFOL 10 MG/ML IV BOLUS
INTRAVENOUS | Status: DC | PRN
  Administered 2016-01-24: 50 mg via INTRAVENOUS
  Administered 2016-01-24: 150 mg via INTRAVENOUS
  Administered 2016-01-24: 50 mg via INTRAVENOUS

## 2016-01-24 MED ORDER — HYDROMORPHONE HCL 1 MG/ML IJ SOLN
INTRAMUSCULAR | Status: AC
  Filled 2016-01-24: qty 1

## 2016-01-24 MED ORDER — ASPIRIN EC 325 MG PO TBEC
325.0000 mg | DELAYED_RELEASE_TABLET | Freq: Every day | ORAL | Status: DC
  Administered 2016-01-25 – 2016-01-28 (×4): 325 mg via ORAL
  Filled 2016-01-24 (×4): qty 1

## 2016-01-24 MED ORDER — METHOCARBAMOL 500 MG PO TABS
500.0000 mg | ORAL_TABLET | Freq: Four times a day (QID) | ORAL | Status: DC | PRN
  Administered 2016-01-24 – 2016-01-28 (×7): 500 mg via ORAL
  Filled 2016-01-24 (×7): qty 1

## 2016-01-24 MED ORDER — CLINDAMYCIN PHOSPHATE 600 MG/50ML IV SOLN
600.0000 mg | Freq: Four times a day (QID) | INTRAVENOUS | Status: AC
  Administered 2016-01-24 – 2016-01-25 (×3): 600 mg via INTRAVENOUS
  Filled 2016-01-24 (×3): qty 50

## 2016-01-24 MED ORDER — BUPIVACAINE HCL (PF) 0.5 % IJ SOLN
INTRAMUSCULAR | Status: DC | PRN
  Administered 2016-01-24 (×2): 20 mL

## 2016-01-24 MED ORDER — METOCLOPRAMIDE HCL 5 MG/ML IJ SOLN: 5.0000 mg | Freq: Three times a day (TID) | INTRAMUSCULAR | Status: DC | PRN

## 2016-01-24 MED ORDER — METOPROLOL TARTARATE 1 MG/ML SYRINGE (5ML)
Status: DC | PRN
  Administered 2016-01-24 (×3): 1 mg via INTRAVENOUS

## 2016-01-24 MED ORDER — MENTHOL 3 MG MT LOZG: 1.0000 | LOZENGE | OROMUCOSAL | Status: DC | PRN

## 2016-01-24 MED ORDER — HYDROMORPHONE HCL 1 MG/ML IJ SOLN
INTRAMUSCULAR | Status: DC | PRN
  Administered 2016-01-24 (×4): .25 mg via INTRAVENOUS

## 2016-01-24 MED ORDER — ONDANSETRON HCL 4 MG/2ML IJ SOLN
INTRAMUSCULAR | Status: AC
  Filled 2016-01-24: qty 2

## 2016-01-24 MED ORDER — PANTOPRAZOLE SODIUM 40 MG PO TBEC
40.0000 mg | DELAYED_RELEASE_TABLET | Freq: Every day | ORAL | Status: DC
  Administered 2016-01-25 – 2016-01-28 (×4): 40 mg via ORAL
  Filled 2016-01-24 (×4): qty 1

## 2016-01-24 MED ORDER — GLYCOPYRROLATE 0.2 MG/ML IJ SOLN
INTRAMUSCULAR | Status: DC | PRN
  Administered 2016-01-24: 0.2 mg via INTRAVENOUS

## 2016-01-24 MED ORDER — LIDOCAINE 2% (20 MG/ML) 5 ML SYRINGE
INTRAMUSCULAR | Status: AC
  Filled 2016-01-24: qty 5

## 2016-01-24 MED ORDER — BISACODYL 10 MG RE SUPP: 10.0000 mg | Freq: Every day | RECTAL | Status: DC | PRN

## 2016-01-24 MED ORDER — GABAPENTIN 600 MG PO TABS
600.0000 mg | ORAL_TABLET | Freq: Three times a day (TID) | ORAL | Status: DC
  Administered 2016-01-24 – 2016-01-28 (×11): 600 mg via ORAL
  Filled 2016-01-24 (×11): qty 1

## 2016-01-24 MED ORDER — 0.9 % SODIUM CHLORIDE (POUR BTL) OPTIME
TOPICAL | Status: DC | PRN
  Administered 2016-01-24: 1000 mL

## 2016-01-24 MED ORDER — METOPROLOL TARTRATE 5 MG/5ML IV SOLN
INTRAVENOUS | Status: AC
  Filled 2016-01-24: qty 5

## 2016-01-24 MED ORDER — HYDROCHLOROTHIAZIDE 12.5 MG PO CAPS
12.5000 mg | ORAL_CAPSULE | Freq: Every day | ORAL | Status: DC
  Administered 2016-01-25 – 2016-01-26 (×2): 12.5 mg via ORAL
  Filled 2016-01-24 (×4): qty 1

## 2016-01-24 MED ORDER — ACETAMINOPHEN 650 MG RE SUPP: 650.0000 mg | Freq: Four times a day (QID) | RECTAL | Status: DC | PRN

## 2016-01-24 MED ORDER — PHENOL 1.4 % MT LIQD
1.0000 | OROMUCOSAL | Status: DC | PRN
  Administered 2016-01-26: 1 via OROMUCOSAL
  Filled 2016-01-24: qty 177

## 2016-01-24 MED ORDER — OXYCODONE HCL 5 MG/5ML PO SOLN: 5.0000 mg | Freq: Once | ORAL | Status: AC | PRN

## 2016-01-24 MED ORDER — BIOTIN 5000 MCG PO CAPS: 1.0000 | ORAL_CAPSULE | Freq: Every day | ORAL | Status: DC

## 2016-01-24 MED ORDER — FENTANYL CITRATE (PF) 100 MCG/2ML IJ SOLN
INTRAMUSCULAR | Status: DC | PRN
  Administered 2016-01-24 (×2): 50 ug via INTRAVENOUS

## 2016-01-24 MED ORDER — CLOTRIMAZOLE 1 % EX CREA
TOPICAL_CREAM | Freq: Two times a day (BID) | CUTANEOUS | Status: DC
  Administered 2016-01-25 – 2016-01-28 (×6): via TOPICAL
  Filled 2016-01-24: qty 15

## 2016-01-24 MED ORDER — METOCLOPRAMIDE HCL 5 MG PO TABS: 5.0000 mg | ORAL_TABLET | Freq: Three times a day (TID) | ORAL | Status: DC | PRN

## 2016-01-24 MED ORDER — OXYCODONE HCL 5 MG PO TABS
ORAL_TABLET | ORAL | Status: AC
  Filled 2016-01-24: qty 1

## 2016-01-24 MED ORDER — ARTIFICIAL TEARS OP OINT
TOPICAL_OINTMENT | OPHTHALMIC | Status: AC
  Filled 2016-01-24: qty 3.5

## 2016-01-24 MED ORDER — DOCUSATE SODIUM 100 MG PO CAPS
100.0000 mg | ORAL_CAPSULE | Freq: Two times a day (BID) | ORAL | Status: DC
  Administered 2016-01-24 – 2016-01-27 (×5): 100 mg via ORAL
  Filled 2016-01-24 (×8): qty 1

## 2016-01-24 MED ORDER — OXYCODONE HCL 5 MG PO TABS
5.0000 mg | ORAL_TABLET | Freq: Once | ORAL | Status: AC | PRN
  Administered 2016-01-24: 5 mg via ORAL

## 2016-01-24 MED ORDER — SODIUM CHLORIDE 0.9 % IV SOLN
INTRAVENOUS | Status: DC
  Administered 2016-01-24 – 2016-01-26 (×2): via INTRAVENOUS

## 2016-01-24 MED ORDER — CHLORHEXIDINE GLUCONATE 4 % EX LIQD: 60.0000 mL | Freq: Once | CUTANEOUS | Status: DC

## 2016-01-24 MED ORDER — LACTATED RINGERS IV SOLN
INTRAVENOUS | Status: DC | PRN
  Administered 2016-01-24 (×3): via INTRAVENOUS

## 2016-01-24 MED ORDER — METHOCARBAMOL 1000 MG/10ML IJ SOLN
500.0000 mg | Freq: Four times a day (QID) | INTRAVENOUS | Status: DC | PRN
  Filled 2016-01-24: qty 5

## 2016-01-24 MED ORDER — LIDOCAINE HCL (CARDIAC) 20 MG/ML IV SOLN
INTRAVENOUS | Status: DC | PRN
  Administered 2016-01-24: 60 mg via INTRATRACHEAL

## 2016-01-24 MED ORDER — CLOTRIMAZOLE 1 % EX SOLN
Freq: Two times a day (BID) | CUTANEOUS | Status: DC
Start: 2016-01-24 — End: 2016-01-24
  Filled 2016-01-24: qty 10

## 2016-01-24 MED ORDER — ARTIFICIAL TEARS OP OINT
TOPICAL_OINTMENT | OPHTHALMIC | Status: DC | PRN
  Administered 2016-01-24: 1 via OPHTHALMIC

## 2016-01-24 MED ORDER — OXYCODONE HCL 5 MG PO TABS
5.0000 mg | ORAL_TABLET | ORAL | Status: DC | PRN
  Administered 2016-01-24 – 2016-01-28 (×10): 10 mg via ORAL
  Filled 2016-01-24 (×11): qty 2

## 2016-01-24 MED ORDER — NATALIZUMAB 300 MG/15ML IV CONC: 300.0000 mg | INTRAVENOUS | Status: DC

## 2016-01-24 MED ORDER — ONDANSETRON HCL 4 MG/2ML IJ SOLN
INTRAMUSCULAR | Status: DC | PRN
  Administered 2016-01-24: 4 mg via INTRAVENOUS

## 2016-01-24 MED ORDER — PHENYLEPHRINE HCL 10 MG/ML IJ SOLN
INTRAMUSCULAR | Status: DC | PRN
  Administered 2016-01-24: 50 ug/min via INTRAVENOUS
  Administered 2016-01-24: 09:00:00 via INTRAVENOUS

## 2016-01-24 MED ORDER — ACETAMINOPHEN 325 MG PO TABS
650.0000 mg | ORAL_TABLET | Freq: Four times a day (QID) | ORAL | Status: DC | PRN
  Administered 2016-01-25 – 2016-01-27 (×5): 650 mg via ORAL
  Filled 2016-01-24 (×5): qty 2

## 2016-01-24 MED ORDER — ONDANSETRON HCL 4 MG/2ML IJ SOLN
4.0000 mg | Freq: Four times a day (QID) | INTRAMUSCULAR | Status: DC | PRN
  Administered 2016-01-25: 4 mg via INTRAVENOUS
  Filled 2016-01-24: qty 2

## 2016-01-24 MED ORDER — LISINOPRIL 20 MG PO TABS
20.0000 mg | ORAL_TABLET | Freq: Every day | ORAL | Status: DC
  Administered 2016-01-26: 20 mg via ORAL
  Filled 2016-01-24 (×3): qty 1

## 2016-01-24 MED ORDER — MIDAZOLAM HCL 2 MG/2ML IJ SOLN
INTRAMUSCULAR | Status: AC
  Filled 2016-01-24: qty 2

## 2016-01-24 MED ORDER — LEVOTHYROXINE SODIUM 112 MCG PO TABS
137.0000 ug | ORAL_TABLET | Freq: Every day | ORAL | Status: DC
  Administered 2016-01-25 – 2016-01-28 (×4): 137 ug via ORAL
  Filled 2016-01-24 (×4): qty 1

## 2016-01-24 MED ORDER — LISINOPRIL-HYDROCHLOROTHIAZIDE 20-12.5 MG PO TABS: 1.0000 | ORAL_TABLET | Freq: Every day | ORAL | Status: DC

## 2016-01-24 MED ORDER — METHOCARBAMOL 500 MG PO TABS
ORAL_TABLET | ORAL | Status: AC
  Filled 2016-01-24: qty 1

## 2016-01-24 SURGICAL SUPPLY — 55 items
ADH SKN CLS APL DERMABOND .7 (GAUZE/BANDAGES/DRESSINGS) ×1
APL SKNCLS STERI-STRIP NONHPOA (GAUZE/BANDAGES/DRESSINGS) ×1
BENZOIN TINCTURE PRP APPL 2/3 (GAUZE/BANDAGES/DRESSINGS) ×2 IMPLANT
BLADE SAW SGTL 18X1.27X75 (BLADE) ×2 IMPLANT
BLADE SAW SGTL 18X1.27X75MM (BLADE) ×1
BLADE SURG ROTATE 9660 (MISCELLANEOUS) IMPLANT
CAPT HIP TOTAL 2 ×2 IMPLANT
CELLS DAT CNTRL 66122 CELL SVR (MISCELLANEOUS) ×1 IMPLANT
CLOSURE WOUND 1/2 X4 (GAUZE/BANDAGES/DRESSINGS) ×1
COVER SURGICAL LIGHT HANDLE (MISCELLANEOUS) ×3 IMPLANT
DERMABOND ADVANCED (GAUZE/BANDAGES/DRESSINGS) ×2
DERMABOND ADVANCED .7 DNX12 (GAUZE/BANDAGES/DRESSINGS) ×1 IMPLANT
DRAPE C-ARM 42X72 X-RAY (DRAPES) ×3 IMPLANT
DRAPE IMP U-DRAPE 54X76 (DRAPES) ×3 IMPLANT
DRAPE STERI IOBAN 125X83 (DRAPES) ×3 IMPLANT
DRAPE U-SHAPE 47X51 STRL (DRAPES) ×9 IMPLANT
DRSG MEPILEX BORDER 4X8 (GAUZE/BANDAGES/DRESSINGS) ×3 IMPLANT
DURAPREP 26ML APPLICATOR (WOUND CARE) ×3 IMPLANT
ELECT BLADE 4.0 EZ CLEAN MEGAD (MISCELLANEOUS)
ELECT BLADE TIP CTD 4 INCH (ELECTRODE) ×3 IMPLANT
ELECT CAUTERY BLADE 6.4 (BLADE) ×3 IMPLANT
ELECT REM PT RETURN 9FT ADLT (ELECTROSURGICAL) ×3
ELECTRODE BLDE 4.0 EZ CLN MEGD (MISCELLANEOUS) IMPLANT
ELECTRODE REM PT RTRN 9FT ADLT (ELECTROSURGICAL) ×1 IMPLANT
FACESHIELD WRAPAROUND (MASK) ×6 IMPLANT
FACESHIELD WRAPAROUND OR TEAM (MASK) ×2 IMPLANT
GLOVE BIOGEL PI IND STRL 8 (GLOVE) ×2 IMPLANT
GLOVE BIOGEL PI INDICATOR 8 (GLOVE) ×4
GLOVE ORTHO TXT STRL SZ7.5 (GLOVE) ×6 IMPLANT
GOWN STRL REUS W/ TWL LRG LVL3 (GOWN DISPOSABLE) ×1 IMPLANT
GOWN STRL REUS W/ TWL XL LVL3 (GOWN DISPOSABLE) ×1 IMPLANT
GOWN STRL REUS W/TWL 2XL LVL3 (GOWN DISPOSABLE) ×3 IMPLANT
GOWN STRL REUS W/TWL LRG LVL3 (GOWN DISPOSABLE) ×3
GOWN STRL REUS W/TWL XL LVL3 (GOWN DISPOSABLE) ×3
KIT BASIN OR (CUSTOM PROCEDURE TRAY) ×3 IMPLANT
KIT ROOM TURNOVER OR (KITS) ×3 IMPLANT
MANIFOLD NEPTUNE II (INSTRUMENTS) ×3 IMPLANT
NS IRRIG 1000ML POUR BTL (IV SOLUTION) ×3 IMPLANT
PACK TOTAL JOINT (CUSTOM PROCEDURE TRAY) ×3 IMPLANT
PACK UNIVERSAL I (CUSTOM PROCEDURE TRAY) ×3 IMPLANT
PAD ARMBOARD 7.5X6 YLW CONV (MISCELLANEOUS) ×6 IMPLANT
RETRACTOR WND ALEXIS 18 MED (MISCELLANEOUS) ×1 IMPLANT
RTRCTR WOUND ALEXIS 18CM MED (MISCELLANEOUS) ×3
STRIP CLOSURE SKIN 1/2X4 (GAUZE/BANDAGES/DRESSINGS) ×1 IMPLANT
SUT ETHIBOND NAB CT1 #1 30IN (SUTURE) IMPLANT
SUT VIC AB 0 CT1 27 (SUTURE) ×3
SUT VIC AB 0 CT1 27XBRD ANBCTR (SUTURE) ×1 IMPLANT
SUT VIC AB 2-0 CT1 27 (SUTURE) ×3
SUT VIC AB 2-0 CT1 TAPERPNT 27 (SUTURE) ×1 IMPLANT
SUT VICRYL 4-0 PS2 18IN ABS (SUTURE) ×3 IMPLANT
SUT VLOC 180 0 24IN GS25 (SUTURE) ×3 IMPLANT
TOWEL OR 17X24 6PK STRL BLUE (TOWEL DISPOSABLE) ×3 IMPLANT
TOWEL OR 17X26 10 PK STRL BLUE (TOWEL DISPOSABLE) ×6 IMPLANT
TRAY FOLEY CATH 16FRSI W/METER (SET/KITS/TRAYS/PACK) IMPLANT
WATER STERILE IRR 1000ML POUR (IV SOLUTION) ×6 IMPLANT

## 2016-01-24 NOTE — Anesthesia Preprocedure Evaluation (Signed)
Anesthesia Evaluation  Patient identified by MRN, date of birth, ID band Patient awake    Reviewed: Allergy & Precautions, NPO status , Patient's Chart, lab work & pertinent test results  Airway Mallampati: II   Neck ROM: full    Dental   Pulmonary sleep apnea ,    breath sounds clear to auscultation       Cardiovascular hypertension,  Rhythm:regular Rate:Normal     Neuro/Psych  Headaches, PSYCHIATRIC DISORDERS Anxiety Depression    GI/Hepatic GERD  ,  Endo/Other  diabetes, Type 2Hypothyroidism Hyperthyroidism   Renal/GU      Musculoskeletal  (+) Arthritis ,   Abdominal   Peds  Hematology   Anesthesia Other Findings   Reproductive/Obstetrics                             Anesthesia Physical Anesthesia Plan  ASA: II  Anesthesia Plan: General   Post-op Pain Management:    Induction: Intravenous  Airway Management Planned: Oral ETT  Additional Equipment:   Intra-op Plan:   Post-operative Plan: Extubation in OR  Informed Consent: I have reviewed the patients History and Physical, chart, labs and discussed the procedure including the risks, benefits and alternatives for the proposed anesthesia with the patient or authorized representative who has indicated his/her understanding and acceptance.     Plan Discussed with: CRNA, Anesthesiologist and Surgeon  Anesthesia Plan Comments:         Anesthesia Quick Evaluation

## 2016-01-24 NOTE — H&P (Signed)
TOTAL HIP ADMISSION H&P  Patient is admitted for left total hip arthroplasty.  Subjective:  Chief Complaint: left hip pain  HPI: Mary Strong, 55 y.o. female, has a history of pain and functional disability in the left hip(s) due to arthritis and patient has failed non-surgical conservative treatments for greater than 12 weeks to include NSAID's and/or analgesics, use of assistive devices, weight reduction as appropriate and activity modification.  Onset of symptoms was gradual starting 10 years ago with gradually worsening course since that time.The patient noted no past surgery on the left hip(s).  Patient currently rates pain in the left hip at 10 out of 10 with activity. Patient has worsening of pain with activity and weight bearing, trendelenberg gait, pain that interfers with activities of daily living, pain with passive range of motion and crepitus. Patient has evidence of subchondral sclerosis, periarticular osteophytes and joint space narrowing by imaging studies. This condition presents safety issues increasing the risk of falls. There is no current active infection.  Patient Active Problem List   Diagnosis Date Noted  . Acute blood loss anemia 12/08/2015  . Osteoarthritis of right hip 12/04/2015  . High risk medication use 09/03/2014  . Low serum vitamin D 09/03/2014  . Vitamin D deficiency 09/03/2014  . Multiple sclerosis ([place]) 05/31/2014  . 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 Dysesthesia 05/31/2014  . Chronic fatigue 05/31/2014  . Insomnia 05/31/2014  . Depression 05/31/2014   Past Medical History:  Diagnosis Date  . Anxiety   . Arthritis   . Cancer ([place])    basal cell cancer on forehead, strong family hx of adenocarcinoma  . Complication of anesthesia    patient has MS  . Constipation   . Depression   . Dysrhythmia    echo -palpitations  . Family history of adverse reaction to anesthesia    Mom's bladder would not wake up, she has MS also.  . Fatty liver   .  GERD (gastroesophageal reflux disease)   . Headache    migraines during teen-early 20's  . History of palpitations    none since weight loss  . Hypertension    on Lisinopril to protect kidneys, was eclamptic with both children, otherwise pt usually has low blood pressure  . Hyperthyroidism    thyroid has been irradiated  . Hypothyroidism   . Memory difficulties    due to MS  . Movement disorder   . Multiple sclerosis ([place])   . Neurogenic bladder   . Neuropathy ([place])   . Palpitations   . Restless leg syndrome   . Sleep apnea    does not use cpap,  dx yrs ago improved with weight loss  . Type 2 diabetes, diet controlled ([place])    diet controled  . Vision abnormalities     Past Surgical History:  Procedure Laterality Date  . CESAREAN SECTION     x 2  . CHOLECYSTECTOMY    . ENDOMETRIAL ABLATION    . JOINT REPLACEMENT    . TONSILLECTOMY    . TOTAL HIP ARTHROPLASTY Right 12/04/2015  . TOTAL HIP ARTHROPLASTY Right 12/04/2015   Procedure: TOTAL HIP ARTHROPLASTY ANTERIOR APPROACH;  Surgeon: Marybelle Killings, MD;  Location: [place];  Service: Orthopedics;  Laterality: Right;  . TUBAL LIGATION      Prescriptions Prior to Admission  Medication Sig Dispense Refill Last Dose  . aspirin 81 MG tablet Take 81 mg by mouth daily.   wk ago at Unknown time  . Biotin  5000 MCG CAPS Take 1 capsule by mouth daily.    wk ago  . diazepam (VALIUM) 5 MG tablet TAKE THREE TABLETS BY MOUTH AT BEDTIME (Patient taking differently: TAKE THREE TABLETS BY MOUTH AT BEDTIME AS NEEDED FOR ANXIETY) 90 tablet 5 Past Week at Unknown time  . DULoxetine (CYMBALTA) 60 MG capsule Take 1 capsule (60 mg total) by mouth at bedtime. 30 capsule 11 12/03/2015 at Unknown time  . gabapentin (NEURONTIN) 600 MG tablet TAKE ONE TABLET BY MOUTH THREE TIMES DAILY 90 tablet 3   . ibuprofen (ADVIL,MOTRIN) 800 MG tablet Take 800 mg by mouth 2 (two) times daily.   wk ago  . levothyroxine (SYNTHROID, LEVOTHROID) 137 MCG tablet Take 137 mcg by  mouth daily.   12/04/2015 at 0730  . lisinopril-hydrochlorothiazide (PRINZIDE,ZESTORETIC) 20-12.5 MG per tablet Take 1 tablet by mouth daily.    12/03/2015 at Unknown time  . mometasone (ELOCON) 0.1 % cream Apply 1 application topically daily as needed (dermatosis).    Past Week at Unknown time  . natalizumab (TYSABRI) 300 MG/15ML injection Inject 300 mg into the vein every 30 (thirty) days.   12/19/15  . omeprazole (PRILOSEC) 20 MG capsule Take 20 mg by mouth daily.   12/04/2015 at 0730  . psyllium (METAMUCIL) 58.6 % powder Take 1 packet by mouth daily. Takes a capful   Past Week at Unknown time  . tamsulosin (FLOMAX) 0.4 MG CAPS capsule Take 1 capsule (0.4 mg total) by mouth daily. 30 capsule 11 12/04/2015 at 0730  . fentaNYL (DURAGESIC) 25 MCG/HR patch Place 1 patch (25 mcg total) onto the skin every 3 (three) days. 10 patch 0   . oxyCODONE-acetaminophen (ROXICET) 5-325 MG tablet Take 1-2 tablets by mouth every 6 (six) hours as needed for severe pain. (Patient not taking: Reported on 01/15/2016) 30 tablet 0 Completed Course at Unknown time   Allergies  Allergen Reactions  . Shellfish Allergy Anaphylaxis  . Ciprofloxacin Hives  . Copaxone [Glatiramer Acetate] Hives  . Penicillins Hives    Has patient had a PCN reaction causing immediate rash, facial/tongue/throat swelling, SOB or lightheadedness with hypotension:- -  rash Has patient had a PCN reaction causing severe rash involving mucus membranes or skin necrosis: no Has patient had a PCN reaction that required hospitalization {no Has patient had a PCN reaction occurring within the last 10 years: yes If all of the above answers are "NO", then may proceed with Cephalosporin use.  . Vancomycin Itching    Social History  Substance Use Topics  . Smoking status: Never Smoker  . Smokeless tobacco: Never Used  . Alcohol use 0.0 oz/week     Comment: "none since 2014"    Family History  Problem Relation Age of Onset  . Multiple sclerosis Mother   .  Arthritis/Rheumatoid Mother   . Psoriasis Father      Review of Systems  Constitutional: Negative.   HENT: Negative.   Eyes: Negative.   Respiratory: Negative.   Cardiovascular: Negative.   Gastrointestinal: Negative.   Genitourinary: Negative.   Musculoskeletal: Positive for joint pain.  Skin: Negative.   Neurological: Negative.   Psychiatric/Behavioral: Negative.     Objective:  Physical Exam  Constitutional: She is oriented to person, place, and time. No distress.  HENT:  Head: Normocephalic and atraumatic.  Eyes: EOM are normal. Pupils are equal, round, and reactive to light.  Neck: Normal range of motion.  Cardiovascular: Normal rate.   Respiratory: Effort normal and breath sounds normal.  GI:  She exhibits no distension.  Musculoskeletal: She exhibits tenderness.  Neurological: She is alert and oriented to person, place, and time.  Skin: Skin is warm and dry.  Psychiatric: She has a normal mood and affect.    Vital signs in last 24 hours: Temp:  [98.7 F (37.1 C)] 98.7 F (37.1 C) (09/22 0616) Pulse Rate:  [94] 94 (09/22 0616) Resp:  [18] 18 (09/22 0616) BP: (105)/(69) 105/69 (09/22 0616) SpO2:  [98 %] 98 % (09/22 0616)  Labs:   Estimated body mass index is 33.87 kg/m as calculated from the following:   Height as of 01/17/16: 5\' 2"  (1.575 m).   Weight as of 01/17/16: 84 kg (185 lb 3.2 oz).   Imaging Review Plain radiographs demonstrate moderate degenerative joint disease of the left hip(s). The bone quality appears to be good for age and reported activity level.  Assessment/Plan:  End stage arthritis, left hip(s)  The patient history, physical examination, clinical judgement of the provider and imaging studies are consistent with end stage degenerative joint disease of the left hip(s) and total hip arthroplasty is deemed medically necessary. The treatment options including medical management, injection therapy, arthroscopy and arthroplasty were discussed  at length. The risks and benefits of total hip arthroplasty were presented and reviewed. The risks due to aseptic loosening, infection, stiffness, dislocation/subluxation,  thromboembolic complications and other imponderables were discussed.  The patient acknowledged the explanation, agreed to proceed with the plan and consent was signed. Patient is being admitted for inpatient treatment for surgery, pain control, PT, OT, prophylactic antibiotics, VTE prophylaxis, progressive ambulation and ADL's and discharge planning.The patient is planning to be discharged home with home health services

## 2016-01-24 NOTE — Transfer of Care (Signed)
Immediate Anesthesia Transfer of Care Note  Patient: Mary Strong  Procedure(s) Performed: Procedure(s) with comments: TOTAL HIP ARTHROPLASTY ANTERIOR APPROACH (Left) - anterior approach  Patient Location: PACU  Anesthesia Type:General  Level of Consciousness: awake, alert , oriented and patient cooperative  Airway & Oxygen Therapy: Patient Spontanous Breathing and Patient connected to nasal cannula oxygen  Post-op Assessment: Report given to RN, Post -op Vital signs reviewed and stable, Patient moving all extremities X 4 and Patient able to stick tongue midline  Post vital signs: Reviewed and stable  Last Vitals:  Vitals:   01/24/16 0616  BP: 105/69  Pulse: 94  Resp: 18  Temp: 37.1 C    Last Pain:  Vitals:   01/24/16 0714  TempSrc:   PainSc: 6          Complications: No apparent anesthesia complications

## 2016-01-24 NOTE — Interval H&P Note (Signed)
History and Physical Interval Note:  01/24/2016 7:19 AM  Mary Strong  has presented today for surgery, with the diagnosis of Left Hip Osteoarthritis  The various methods of treatment have been discussed with the patient and family. After consideration of risks, benefits and other options for treatment, the patient has consented to  Procedure(s): TOTAL HIP ARTHROPLASTY ANTERIOR APPROACH (Left) as a surgical intervention .  The patient's history has been reviewed, patient examined, no change in status, stable for surgery.  I have reviewed the patient's chart and labs.  Questions were answered to the patient's satisfaction.     Marybelle Killings

## 2016-01-24 NOTE — Anesthesia Procedure Notes (Signed)
Procedure Name: LMA Insertion Date/Time: 01/24/2016 8:35 AM Performed by: Marcie Bal, ADAM Pre-anesthesia Checklist: Patient identified, Emergency Drugs available, Suction available and Patient being monitored Patient Re-evaluated:Patient Re-evaluated prior to inductionOxygen Delivery Method: Circle system utilized Preoxygenation: Pre-oxygenation with 100% oxygen Intubation Type: IV induction LMA: LMA inserted LMA Size: 4.0 Tube type: Oral Number of attempts: 1 Placement Confirmation: positive ETCO2 and breath sounds checked- equal and bilateral Tube secured with: Tape Dental Injury: Teeth and Oropharynx as per pre-operative assessment

## 2016-01-24 NOTE — Brief Op Note (Signed)
01/24/2016  10:06 AM  PATIENT:  Evert Kohl  55 y.o. female  PRE-OPERATIVE DIAGNOSIS:  Left Hip Osteoarthritis  POST-OPERATIVE DIAGNOSIS:  Left Hip Osteoarthritis  PROCEDURE:  Procedure(s) with comments: TOTAL HIP ARTHROPLASTY ANTERIOR APPROACH (Left) - anterior approach  SURGEON:  Surgeon(s) and Role:    * Marybelle Killings, MD - Primary  PHYSICIAN ASSISTANT: Benjiman Core pa-c    ANESTHESIA:   general  EBL:  Total I/O In: 2000 [I.V.:2000] Out: 50 [Blood:50]  BLOOD ADMINISTERED:none  DRAINS: none   LOCAL MEDICATIONS USED:  MARCAINE     SPECIMEN:  No Specimen  DISPOSITION OF SPECIMEN:  N/A  COUNTS:  YES  TOURNIQUET:  * No tourniquets in log *  DICTATION: .Dragon Dictation  PLAN OF CARE: Admit to inpatient   PATIENT DISPOSITION:  PACU - hemodynamically stable.

## 2016-01-24 NOTE — OR Nursing (Signed)
[id number]: In&out cath=150cc concentrated yellow urine; per protocol, no trauma

## 2016-01-25 ENCOUNTER — Inpatient Hospital Stay (HOSPITAL_COMMUNITY): Payer: Medicare Other

## 2016-01-25 LAB — CBC
HEMATOCRIT: 27.2 % — AB (ref 36.0–46.0)
HEMOGLOBIN: 8.9 g/dL — AB (ref 12.0–15.0)
MCH: 27.9 pg (ref 26.0–34.0)
MCHC: 32.7 g/dL (ref 30.0–36.0)
MCV: 85.3 fL (ref 78.0–100.0)
Platelets: 135 10*3/uL — ABNORMAL LOW (ref 150–400)
RBC: 3.19 MIL/uL — AB (ref 3.87–5.11)
RDW: 15.6 % — ABNORMAL HIGH (ref 11.5–15.5)
WBC: 7.1 10*3/uL (ref 4.0–10.5)

## 2016-01-25 LAB — BASIC METABOLIC PANEL
ANION GAP: 8 (ref 5–15)
BUN: 13 mg/dL (ref 6–20)
CALCIUM: 8.8 mg/dL — AB (ref 8.9–10.3)
CHLORIDE: 102 mmol/L (ref 101–111)
CO2: 27 mmol/L (ref 22–32)
Creatinine, Ser: 1.11 mg/dL — ABNORMAL HIGH (ref 0.44–1.00)
GFR calc non Af Amer: 55 mL/min — ABNORMAL LOW (ref >60)
Glucose, Bld: 152 mg/dL — ABNORMAL HIGH (ref 65–99)
POTASSIUM: 4.3 mmol/L (ref 3.5–5.1)
Sodium: 137 mmol/L (ref 135–145)

## 2016-01-25 LAB — URINALYSIS, ROUTINE W REFLEX MICROSCOPIC
Bilirubin Urine: NEGATIVE
Glucose, UA: NEGATIVE mg/dL
HGB URINE DIPSTICK: NEGATIVE
Ketones, ur: NEGATIVE mg/dL
Leukocytes, UA: NEGATIVE
NITRITE: NEGATIVE
PH: 6.5 (ref 5.0–8.0)
Protein, ur: NEGATIVE mg/dL
SPECIFIC GRAVITY, URINE: 1.012 (ref 1.005–1.030)

## 2016-01-25 NOTE — Progress Notes (Signed)
Subjective: 1 Day Post-Op Procedure(s) (LRB): TOTAL HIP ARTHROPLASTY ANTERIOR APPROACH (Left) Patient reports pain as mild and moderate.    Objective: Vital signs in last 24 hours: Temp:  [98.3 F (36.8 C)-102.4 F (39.1 C)] 102.3 F (39.1 C) (09/23 0449) Pulse Rate:  [94-138] 138 (09/23 0449) Resp:  [16-18] 18 (09/23 0449) BP: (91-105)/(53-71) 91/53 (09/23 0449) SpO2:  [95 %-100 %] 95 % (09/23 0449)  Intake/Output from previous day: 09/22 0701 - 09/23 0700 In: 3660 [P.O.:840; I.V.:2820] Out: 1050 [Urine:1000; Blood:50] Intake/Output this shift: No intake/output data recorded.   Recent Labs  01/25/16 0558  HGB 8.9*    Recent Labs  01/25/16 0558  WBC 7.1  RBC 3.19*  HCT 27.2*  PLT 135*    Recent Labs  01/25/16 0558  NA 137  K 4.3  CL 102  CO2 27  BUN 13  CREATININE 1.11*  GLUCOSE 152*  CALCIUM 8.8*   No results for input(s): LABPT, INR in the last 72 hours.  Neurologically intact  Assessment/Plan: 1 Day Post-Op Procedure(s) (LRB): TOTAL HIP ARTHROPLASTY ANTERIOR APPROACH (Left) Plan :  Up with therapy,needs cream  Abdominal rash.  Mary Strong 01/25/2016, 9:33 AM

## 2016-01-25 NOTE — Evaluation (Signed)
Physical Therapy Evaluation Patient Details Name: Mary Strong MRN: [id number] DOB: [birth year] Today's Date: 01/25/2016   History of Present Illness  pt is a pleasant 55 y/o female s/p TOTAL HIP ARTHROPLASTY ANTERIOR APPROACH (Left). PMH includes Right THR - anterior 12/2015, Multiple sclerosis, Spastic diplegia, DM, vision abnormalities, chronic fatigue, palpatations, DM, Dysesthesia, restless leg, hypertension  Clinical Impression  Patient present with mild dependencies in gait and mobility.  Patient recently underwent same surgery on right side and is very knowledgeable about her recovery/limitations.   Patient will benefit from PT to progress mobility and increase independence with mobility for discharge to home with family.      Follow Up Recommendations Home health PT    Equipment Recommendations  None recommended by PT    Recommendations for Other Services       Precautions / Restrictions Precautions Precautions: Anterior Hip Restrictions Weight Bearing Restrictions: Yes LLE Weight Bearing: Weight bearing as tolerated      Mobility  Bed Mobility Overal bed mobility: Needs Assistance Bed Mobility: Supine to Sit;Sit to Supine     Supine to sit: Supervision Sit to supine: Min assist   General bed mobility comments: used railing; needed assist for LLE in returning to supine  Transfers Overall transfer level: Needs assistance Equipment used: Rolling walker (2 wheeled) Transfers: Sit to/from Stand Sit to Stand: Supervision            Ambulation/Gait Ambulation/Gait assistance: Supervision Ambulation Distance (Feet): 80 Feet Assistive device: Rolling walker (2 wheeled) Gait Pattern/deviations: Step-to pattern   Gait velocity interpretation: Below normal speed for age/gender    Stairs            Wheelchair Mobility    Modified Rankin (Stroke Patients Only)       Balance Overall balance assessment: Needs assistance Sitting-balance support:  No upper extremity supported;Feet supported Sitting balance-Leahy Scale: Good     Standing balance support: Bilateral upper extremity supported Standing balance-Leahy Scale: Poor Standing balance comment: used RW for balance                             Pertinent Vitals/Pain Pain Assessment: 0-10 Pain Score: 3  Pain Location: left hip Pain Descriptors / Indicators: Aching;Discomfort Pain Intervention(s): Limited activity within patient's tolerance;Monitored during session;Premedicated before session    Home Living Family/patient expects to be discharged to:: Private residence Living Arrangements: Spouse/significant other Available Help at Discharge: Family;Available 24 hours/day Type of Home: Mobile home Home Access: Stairs to enter Entrance Stairs-Rails: Left Entrance Stairs-Number of Steps: 6 to get to deck, then 1 to get into house Home Layout: One level Home Equipment: Cane - single point;Wheelchair - [employer] - 4 wheels      Prior Function Level of Independence: Independent with assistive device(s)               Hand Dominance        Extremity/Trunk Assessment   Upper Extremity Assessment: Overall WFL for tasks assessed           Lower Extremity Assessment: Generalized weakness      Cervical / Trunk Assessment: Normal  Communication   Communication: No difficulties  Cognition Arousal/Alertness: Awake/alert Behavior During Therapy: WFL for tasks assessed/performed Overall Cognitive Status: Within Functional Limits for tasks assessed                      General Comments      Exercises Total Joint  Exercises Ankle Circles/Pumps: AROM;Both;10 reps;Seated Quad Sets: AROM;Both;10 reps;Supine Gluteal Sets: AROM;Both;10 reps;Supine Heel Slides: AAROM;Left;10 reps;Supine Hip ABduction/ADduction: AAROM;Left;10 reps;Supine Long Arc Quad: AAROM;Left;5 reps;Seated   Assessment/Plan    PT Assessment Patient needs continued PT  services  PT Problem List Decreased balance;Decreased activity tolerance;Decreased mobility;Decreased strength          PT Treatment Interventions DME instruction;Gait training;Stair training;Functional mobility training;Therapeutic activities;Therapeutic exercise;Balance training;Patient/family education    PT Goals (Current goals can be found in the Care Plan section)  Acute Rehab PT Goals Patient Stated Goal: go back home PT Goal Formulation: With patient Time For Goal Achievement: 01/27/16 Potential to Achieve Goals: Good    Frequency 7X/week   Barriers to discharge        Co-evaluation               End of Session Equipment Utilized During Treatment: Gait belt Activity Tolerance: Patient tolerated treatment well Patient left: in bed;with call bell/phone within reach           Time: [id number] PT Time Calculation (min) (ACUTE ONLY): 28 min   Charges:   PT Evaluation $PT Eval Moderate Complexity: 1 Procedure PT Treatments $Therapeutic Exercise: 8-22 mins   PT G Codes:        Shanna Cisco 01/25/2016, 11:43 AM  01/25/2016 [place], PT [phone]

## 2016-01-25 NOTE — Progress Notes (Signed)
MD paged as pt has been having fevers since last night despite being diligent with the incentive spirometry. Tylenol administered for a fever of 102 this pm. Orders for CXR and urinalysis given by MD

## 2016-01-25 NOTE — Progress Notes (Signed)
OT Cancellation Note  Patient Details Name: Mary Strong MRN: [id number] DOB: [birth year]   Cancelled Treatment:    Reason Eval/Treat Not Completed: OT screened, no needs identified, will sign off. Pt with recent R THA in 12/2015. Per PT; pt with no acute OT needs. Will screen and sign off at this time. Please re-consult if needs change. Thank you for this referral.  Binnie Kand M.S., OTR/L Pager: [phone]  01/25/2016, 12:10 PM

## 2016-01-25 NOTE — Op Note (Signed)
Mary Strong, Mary Strong             ACCOUNT NO.:  [id number]  MEDICAL RECORD NO.:  [id number]  LOCATION:  [facility]                         FACILITY:  [facility]  PHYSICIAN:  Loi Rennaker C. Lorin Mercy, M.D.    DATE OF BIRTH:  Dec 30, 1960  DATE OF PROCEDURE:  01/24/2016 DATE OF DISCHARGE:                              OPERATIVE REPORT   PREOPERATIVE DIAGNOSIS:  Left hip primary osteoarthritis.  POSTOPERATIVE DIAGNOSIS:  Left hip primary osteoarthritis.  PROCEDURE:  Left total hip arthroplasty, direct anterior approach.  SURGEON:  Nathalya Wolanski C. Lorin Mercy, M.D.  ASSISTANT:  Alyson Locket. Ricard Dillon, PA-C.  ANESTHESIA:  LMA plus Marcaine 20 mL local.  ESTIMATED BLOOD LOSS:  100 mL.  IMPLANTS:  DePuy Corail #12 stem +1.5 ceramic ball, 32 mm diameter head and 48 Gription single hole cup.  PROCEDURE:  After induction of general anesthesia, standard prepping and draping, the patient had a #11 stem on the opposite side.  C-arm was brought in after 10-15 drapes were applied with good visualization of both hips.  DuraPrep, standard prepping and draping, large shower curtain, Betadine, Steri-Drape half sheet above and half sheet across. Time-out procedure was completed.  Clindamycin had been given preoperatively.  Incision was made obliquely starting 2 cm lateral and inferior to the ASIS.  Fascia was split.  Initially, there was some fibrous tissue on the tensor fascia and the plane would not develop medially.  2-0 Vicryl was run and then incision was made another 2 cm more medial, and we were able to dissect over the muscle easily.  Blunt Hohmann was placed down over the top of the capsule, meticulous cauterization of transverse vessels was performed.  The patient had had a transfusion due to some postoperative bleeding on the opposite hip. Capsule was removed anteriorly.  Neck was cut under C-arm visualization. Sequential reaming of the acetabulum up to 48, insertion of the cup under C-arm visualization for appropriate cup  abduction and cup flexion.  Posterior capsule was removed, external rotators were saved.  The trochanter was rongeured and curetted out.  Large bent Hohmann was placed behind the trochanter with care and a Mueller medially. Hydraulic hook was placed.  Leg was taken down and under and then sequential preparation.  We then put an 11 in, it appeared to be in slight varus and by x-ray, could have been slightly undersized even on the opposite side, 11 gave a nice fit and fill.  I used a curette as well as rongeur, worked a little bit more for lateralization on the approach and then put a 12 down which gave excellent fit and fill and looked good under x-ray, identical leg lengths with 11 mm calcar length. Irrigation, lavage, and insertion of the permanent stem, +1.5 ball with identical stability, external rotation 90 degrees with the leg in extension with no dislocation.  Leg lengths were equal.  V-Loc suture was used for fascia with 2-0 subcutaneous tissue, subcuticular closure, postop dressing.  The patient tolerated the procedure well.     Tadd Holtmeyer C. Lorin Mercy, M.D.     MCY/MEDQ  D:  01/24/2016  T:  01/24/2016  Job:  [id number]

## 2016-01-25 NOTE — Progress Notes (Signed)
PT Cancellation Note  Patient Details Name: Mary Strong MRN: [id number] DOB: Apr 08, 1961   Cancelled Treatment:    Reason Eval/Treat Not Completed: Medical issues which prohibited therapy (patient running fever and decline PT this pm)   Shanna Cisco 01/25/2016, 3:27 PM

## 2016-01-26 ENCOUNTER — Inpatient Hospital Stay (HOSPITAL_COMMUNITY): Payer: Medicare Other

## 2016-01-26 LAB — CBC
HEMATOCRIT: 26.9 % — AB (ref 36.0–46.0)
HEMATOCRIT: 22 % — AB (ref 36.0–46.0)
HEMOGLOBIN: 8.8 g/dL — AB (ref 12.0–15.0)
Hemoglobin: 7.2 g/dL — ABNORMAL LOW (ref 12.0–15.0)
MCH: 27.8 pg (ref 26.0–34.0)
MCH: 27.7 pg (ref 26.0–34.0)
MCHC: 32.7 g/dL (ref 30.0–36.0)
MCHC: 32.7 g/dL (ref 30.0–36.0)
MCV: 84.9 fL (ref 78.0–100.0)
MCV: 84.6 fL (ref 78.0–100.0)
Platelets: 126 10*3/uL — ABNORMAL LOW (ref 150–400)
Platelets: 114 10*3/uL — ABNORMAL LOW (ref 150–400)
RBC: 3.17 MIL/uL — AB (ref 3.87–5.11)
RBC: 2.6 MIL/uL — AB (ref 3.87–5.11)
RDW: 15.6 % — ABNORMAL HIGH (ref 11.5–15.5)
RDW: 15.8 % — ABNORMAL HIGH (ref 11.5–15.5)
WBC: 9.2 10*3/uL (ref 4.0–10.5)
WBC: 8.7 10*3/uL (ref 4.0–10.5)

## 2016-01-26 LAB — BASIC METABOLIC PANEL
ANION GAP: 7 (ref 5–15)
Anion gap: 8 (ref 5–15)
BUN: 10 mg/dL (ref 6–20)
BUN: 12 mg/dL (ref 6–20)
CHLORIDE: 102 mmol/L (ref 101–111)
CHLORIDE: 99 mmol/L — AB (ref 101–111)
CO2: 26 mmol/L (ref 22–32)
CO2: 25 mmol/L (ref 22–32)
Calcium: 8.8 mg/dL — ABNORMAL LOW (ref 8.9–10.3)
Calcium: 7.9 mg/dL — ABNORMAL LOW (ref 8.9–10.3)
Creatinine, Ser: 1.01 mg/dL — ABNORMAL HIGH (ref 0.44–1.00)
Creatinine, Ser: 1.42 mg/dL — ABNORMAL HIGH (ref 0.44–1.00)
GFR calc Af Amer: >60 mL/min (ref >60)
GFR calc non Af Amer: >60 mL/min (ref >60)
GFR, EST AFRICAN AMERICAN: 48 mL/min — AB (ref >60)
GFR, EST NON AFRICAN AMERICAN: 41 mL/min — AB (ref >60)
Glucose, Bld: 156 mg/dL — ABNORMAL HIGH (ref 65–99)
Glucose, Bld: 138 mg/dL — ABNORMAL HIGH (ref 65–99)
POTASSIUM: 3.8 mmol/L (ref 3.5–5.1)
POTASSIUM: 3.3 mmol/L — AB (ref 3.5–5.1)
SODIUM: 136 mmol/L (ref 135–145)
SODIUM: 131 mmol/L — AB (ref 135–145)

## 2016-01-26 LAB — GLUCOSE, CAPILLARY: GLUCOSE-CAPILLARY: 135 mg/dL — AB (ref 65–99)

## 2016-01-26 LAB — PREPARE RBC (CROSSMATCH)

## 2016-01-26 MED ORDER — ASPIRIN 325 MG PO TBEC: 325.0000 mg | DELAYED_RELEASE_TABLET | Freq: Every day | ORAL | 0 refills | Status: DC

## 2016-01-26 MED ORDER — CLOTRIMAZOLE 1 % EX CREA
TOPICAL_CREAM | Freq: Two times a day (BID) | CUTANEOUS | 0 refills | Status: DC
Start: 2016-01-26 — End: 2016-08-03

## 2016-01-26 MED ORDER — IOPAMIDOL (ISOVUE-370) INJECTION 76%
INTRAVENOUS | Status: AC
  Administered 2016-01-26: 80 mL
  Filled 2016-01-26: qty 100

## 2016-01-26 MED ORDER — SODIUM CHLORIDE 0.9 % IV BOLUS (SEPSIS)
500.0000 mL | Freq: Once | INTRAVENOUS | Status: AC | PRN
  Administered 2016-01-26: 500 mL via INTRAVENOUS

## 2016-01-26 MED ORDER — SODIUM CHLORIDE 0.9 % IV SOLN
Freq: Once | INTRAVENOUS | Status: AC
  Administered 2016-01-27: 01:00:00 via INTRAVENOUS

## 2016-01-26 NOTE — Progress Notes (Signed)
Talked to Dr.Yates about low BP, that we had called [facility] and that we were giving an IVF bolus presently, and that I had given her BP meds this am but had held the flomax.  Orders received for CBC, BMP stat and CT Angio to be done.  Explained all to pt.

## 2016-01-26 NOTE — Progress Notes (Signed)
Labs drawn. Additional IV placed for CT Angio.

## 2016-01-26 NOTE — Progress Notes (Signed)
Pt back in room from CT 

## 2016-01-26 NOTE — Significant Event (Signed)
[facility] Event Note RN called for new onset hypotensive crisis.  Overview: Time Called: 1924 Arrival Time: 1925 Event Type: Hypotension  Initial Focused Assessment: Pt alert and oriented x4, lethargic associated with seeing "spots" with standing, skin pale, cool, and dry. BP 65/37, repeat BP 72/44, HR 105-109, 98 % RA, RR 16.   Interventions: PIV started, 500 cc bolus NS administered with SBP 50-70's, pt remained alert through out. Dr. Lorin Mercy paged, new orders given and completed for CT angio, 2 L [place], CBC and BMP. Dr. Lorin Mercy updated on pt's Hgb 7.2, CT angio negative for PE, BP remaining 75/50.  Plan of Care (if not transferred): New orders given for type and cross and to transfuse 1 unit of PRBC. Continue to monitor pt, recollect CBC 1 hour after transfusion of PRBC. Call Dr. Lorin Mercy if SBP continues to remain 60-70's and if Hgb remains low after transfusion. If pt shows improvement continue to monitor and Dr. Lorin Mercy will f/u in am. Pt will remain on our list for continuous monitoring. Reginold Agent RN and Sharee Pimple RN made aware.   Event Summary: Name of Physician Notified: Dr. Lorin Mercy at 2010    at    Outcome: Stayed in room and stabalized  Event End Time: 2300  [place] Pickup

## 2016-01-26 NOTE — Care Management Note (Signed)
Case Management Note  Patient Details  Name: TEARA BRANAM MRN: [id number] Date of Birth: 04/22/1961  Subjective/Objective:                  TOTAL HIP ARTHROPLASTY ANTERIOR APPROACH (Left) - anterior approach  Action/Plan: Cm spoke to patient and offered to set up [facility] PT as recommended. She said that she had this surgery a month ago and did not feel HH PT was needed and felt safe going home without it. She said that if she changedh er mind, she would call her provider office. She has a wheelchair and walker at home and said that she did not want any other equipment. Patient will be discharged home with son and husband. No further CM needs identified.   Expected Discharge Date:                Expected Discharge Plan:  Home/Self Care  In-House Referral:     Discharge planning Services  CM Consult  Post Acute Care Choice:  Home Health Choice offered to:  Patient  DME Arranged:    DME Agency:     HH Arranged:    [place] Agency:     Status of Service:  Completed, signed off  If discussed at [employer] of Stay Meetings, dates discussed:    Additional Comments:  Guido Sander, RN 01/26/2016, 11:20 AM

## 2016-01-26 NOTE — Progress Notes (Signed)
BP down, Iv being started.  [facility] called and notified.

## 2016-01-26 NOTE — Progress Notes (Signed)
Talked to Dr. Lorin Mercy about pt's fever and feeling weak.  DC cancelled.

## 2016-01-26 NOTE — Progress Notes (Signed)
Called CT about Angio, they will send for her. DC'd Fentanyl patch from the Left arm that pt had on since admission.

## 2016-01-26 NOTE — Progress Notes (Signed)
Physical Therapy Treatment Patient Details Name: KAMILAH BEIER MRN: [id number] DOB: [birth year] Today's Date: 01/26/2016    History of Present Illness pt is a pleasant 55 y/o female s/p TOTAL HIP ARTHROPLASTY ANTERIOR APPROACH (Left). PMH includes Right THR - anterior 12/2015, Multiple sclerosis, Spastic diplegia, DM, vision abnormalities, chronic fatigue, palpatations, DM, Dysesthesia, restless leg, hypertension    PT Comments    Reported nausea during session, but overall doing well; OK for dc home from PT standpoint; stair training complete  Follow Up Recommendations  Home health PT     Equipment Recommendations  None recommended by PT    Recommendations for Other Services       Precautions / Restrictions Restrictions LLE Weight Bearing: Weight bearing as tolerated    Mobility  Bed Mobility                  Transfers Overall transfer level: Needs assistance Equipment used: Rolling walker (2 wheeled) Transfers: Sit to/from Stand Sit to Stand: Supervision         General transfer comment: Cues for gait sequence  Ambulation/Gait Ambulation/Gait assistance: Supervision Ambulation Distance (Feet): 40 Feet Assistive device: Rolling walker (2 wheeled) Gait Pattern/deviations: Step-to pattern     General Gait Details: Cues to self-monitor for activity tolerance   Stairs Stairs: Yes Stairs assistance: Min guard Stair Management: One rail Left;Sideways;Step to pattern Number of Stairs: 2 General stair comments: verbal and demo cues for technqiue and sequence  Wheelchair Mobility    Modified Rankin (Stroke Patients Only)       Balance             Standing balance-Leahy Scale: Poor                      Cognition Arousal/Alertness: Awake/alert Behavior During Therapy: WFL for tasks assessed/performed Overall Cognitive Status: Within Functional Limits for tasks assessed                      Exercises      General  Comments        Pertinent Vitals/Pain Pain Assessment: 0-10 Pain Score: 5  Pain Location: L hip Pain Descriptors / Indicators: Aching Pain Intervention(s): Monitored during session    Home Living                      Prior Function            PT Goals (current goals can now be found in the care plan section) Acute Rehab PT Goals Patient Stated Goal: go back home PT Goal Formulation: With patient Time For Goal Achievement: 01/27/16 Potential to Achieve Goals: Good Progress towards PT goals: Progressing toward goals    Frequency    7X/week      PT Plan Current plan remains appropriate    Co-evaluation             End of Session Equipment Utilized During Treatment: Gait belt Activity Tolerance: Patient tolerated treatment well Patient left: in chair;with call bell/phone within reach;with family/visitor present     Time: 1224-1242 PT Time Calculation (min) (ACUTE ONLY): 18 min  Charges:  $Gait Training: 8-22 mins                    G Codes:      Quin Hoop 01/26/2016, 2:52 PM   Roney [place], [place] Pager [phone] Office [phone]

## 2016-01-26 NOTE — Progress Notes (Signed)
Pt to CT via bed with O2 and IV.

## 2016-01-26 NOTE — Progress Notes (Signed)
Subjective: 2 Days Post-Op Procedure(s) (LRB): TOTAL HIP ARTHROPLASTY ANTERIOR APPROACH (Left) Patient reports pain as mild.    Objective: Vital signs in last 24 hours: Temp:  [99.2 F (37.3 C)-102.9 F (39.4 C)] 99.2 F (37.3 C) (09/24 0636) Pulse Rate:  [119-133] 119 (09/24 0636) Resp:  [16-18] 18 (09/24 0636) BP: (91-103)/(49-59) 95/50 (09/24 0636) SpO2:  [93 %-98 %] 93 % (09/24 0636)  Intake/Output from previous day: 09/23 0701 - 09/24 0700 In: 2215.5 [P.O.:240; I.V.:1975.5] Out: 2200 [Urine:2200] Intake/Output this shift: Total I/O In: -  Out: 500 [Urine:500]   Recent Labs  01/25/16 0558 01/26/16 0220  HGB 8.9* 8.8*    Recent Labs  01/25/16 0558 01/26/16 0220  WBC 7.1 9.2  RBC 3.19* 3.17*  HCT 27.2* 26.9*  PLT 135* 126*    Recent Labs  01/25/16 0558 01/26/16 0220  NA 137 136  K 4.3 3.8  CL 102 102  CO2 27 26  BUN 13 10  CREATININE 1.11* 1.01*  GLUCOSE 152* 156*  CALCIUM 8.8* 8.8*   No results for input(s): LABPT, INR in the last 72 hours.  Neurologically intact  Assessment/Plan: 2 Days Post-Op Procedure(s) (LRB): TOTAL HIP ARTHROPLASTY ANTERIOR APPROACH (Left) Up with therapy d/chome today  Marybelle Killings 01/26/2016, 10:49 AM

## 2016-01-27 ENCOUNTER — Encounter (HOSPITAL_COMMUNITY): Payer: Self-pay | Admitting: Orthopaedic Surgery

## 2016-01-27 LAB — BASIC METABOLIC PANEL
Anion gap: 6 (ref 5–15)
BUN: 9 mg/dL (ref 6–20)
CALCIUM: 8.3 mg/dL — AB (ref 8.9–10.3)
CO2: 26 mmol/L (ref 22–32)
CREATININE: 0.93 mg/dL (ref 0.44–1.00)
Chloride: 106 mmol/L (ref 101–111)
GFR calc Af Amer: >60 mL/min (ref >60)
Glucose, Bld: 109 mg/dL — ABNORMAL HIGH (ref 65–99)
POTASSIUM: 3.5 mmol/L (ref 3.5–5.1)
SODIUM: 138 mmol/L (ref 135–145)

## 2016-01-27 LAB — CBC
HEMATOCRIT: 23.8 % — AB (ref 36.0–46.0)
Hemoglobin: 8 g/dL — ABNORMAL LOW (ref 12.0–15.0)
MCH: 27.9 pg (ref 26.0–34.0)
MCHC: 33.6 g/dL (ref 30.0–36.0)
MCV: 82.9 fL (ref 78.0–100.0)
Platelets: 109 10*3/uL — ABNORMAL LOW (ref 150–400)
RBC: 2.87 MIL/uL — ABNORMAL LOW (ref 3.87–5.11)
RDW: 16.3 % — AB (ref 11.5–15.5)
WBC: 7.3 10*3/uL (ref 4.0–10.5)

## 2016-01-27 LAB — PREPARE RBC (CROSSMATCH)

## 2016-01-27 LAB — HEMOGLOBIN AND HEMATOCRIT, BLOOD
HEMATOCRIT: 24 % — AB (ref 36.0–46.0)
Hemoglobin: 7.7 g/dL — ABNORMAL LOW (ref 12.0–15.0)

## 2016-01-27 MED ORDER — SODIUM CHLORIDE 0.9 % IV SOLN
Freq: Once | INTRAVENOUS | Status: AC
  Administered 2016-01-27: 12:00:00 via INTRAVENOUS

## 2016-01-27 NOTE — Care Management Note (Signed)
Case Management Note  Patient Details  Name: Mary Strong MRN: [id number] Date of Birth: Dec 11, 1960  Subjective/Objective:                    Action/Plan:  Received order for HHPT , patient declined . Left Abigail Butts at Dr Lorin Mercy office message Expected Discharge Date:                  Expected Discharge Plan:  Home/Self Care  In-House Referral:     Discharge planning Services  CM Consult  Post Acute Care Choice:  Home Health Choice offered to:  Patient  DME Arranged:    DME Agency:     HH Arranged:    [place] Agency:     Status of Service:  Completed, signed off  If discussed at [employer] of [employer], dates discussed:    Additional Comments:  Marilu Favre, RN 01/27/2016, 10:37 AM

## 2016-01-27 NOTE — Progress Notes (Signed)
Physical Therapy Treatment Patient Details Name: Mary Strong MRN: [id number] DOB: 09/27/1960 Today's Date: 01/27/2016    History of Present Illness pt is a pleasant 55 y/o female s/p TOTAL HIP ARTHROPLASTY ANTERIOR APPROACH (Left). PMH includes Right THR - anterior 12/2015, Multiple sclerosis, Spastic diplegia, DM, vision abnormalities, chronic fatigue, palpatations, DM, Dysesthesia, restless leg, hypertension    PT Comments    Pt performed remainder of HEP for accuracy and use at home.  Pt reports she is opting out of HHPT.  Pt performed R LE during supine and educated to complete at home 3x daily on LLE.    Follow Up Recommendations  Home health PT (Pt declining HHPT.  )     Equipment Recommendations  None recommended by PT    Recommendations for Other Services       Precautions / Restrictions Precautions Precautions: Anterior Hip Restrictions Weight Bearing Restrictions: Yes LLE Weight Bearing: Weight bearing as tolerated    Mobility  Bed Mobility Overal bed mobility: Needs Assistance Bed Mobility: Sit to Supine     Supine to sit: Min assist     General bed mobility comments: Used PTA as railing to achieve sitting edge of bed.    Transfers                    Ambulation/Gait                 Stairs            Wheelchair Mobility    Modified Rankin (Stroke Patients Only)       Balance Overall balance assessment: Needs assistance   Sitting balance-Leahy Scale: Good       Standing balance-Leahy Scale: Poor                      Cognition Arousal/Alertness: Awake/alert Behavior During Therapy: WFL for tasks assessed/performed Overall Cognitive Status: Within Functional Limits for tasks assessed                      Exercises Total Joint Exercises Ankle Circles/Pumps: AROM;Both;10 reps;Supine Quad Sets: AROM;Right;10 reps;Supine Short Arc Quad: AROM;Right;10 reps;Supine Heel Slides: AROM;Right;10  reps;Supine Hip ABduction/ADduction: AROM;Right;10 reps;Supine Long Arc Quad: AROM;Left;10 reps;Supine    General Comments        Pertinent Vitals/Pain Pain Assessment: 0-10 Pain Score: 3  Pain Location: L hip  Pain Descriptors / Indicators: Aching Pain Intervention(s): Monitored during session;Repositioned    Home Living                      Prior Function            PT Goals (current goals can now be found in the care plan section) Acute Rehab PT Goals Patient Stated Goal: go back home Potential to Achieve Goals: Good Progress towards PT goals: Progressing toward goals    Frequency    7X/week      PT Plan Current plan remains appropriate    Co-evaluation             End of Session Equipment Utilized During Treatment: Gait belt Activity Tolerance: Patient tolerated treatment well Patient left: in chair;with call bell/phone within reach;with family/visitor present     Time: [id number] PT Time Calculation (min) (ACUTE ONLY): 15 min  Charges:  $Therapeutic Exercise: 8-22 mins                    G Codes:  Ezekiel Menzer Eli Hose 01/27/2016, 3:58 PM  Governor [place]ook[place], PTA pager [phone]

## 2016-01-27 NOTE — Progress Notes (Signed)
Patient ID: Mary Strong, female   DOB: Jul 18, 1960, 55 y.o.   MRN: [id number] Got lightheaded sitting at bedside with PT. Will transfuse one more unit (2nd PRBC ). Discussed with pt and she agrees.

## 2016-01-27 NOTE — Care Management Important Message (Signed)
Important Message  Patient Details  Name: Mary Strong MRN: [id number] Date of Birth: Jun 20, 1960   Medicare Important Message Given:  Yes    Orbie Pyo 01/27/2016, 12:32 PM

## 2016-01-27 NOTE — Progress Notes (Signed)
Physical Therapy Treatment Patient Details Name: Mary Strong MRN: [id number] DOB: 05-Apr-1961 Today's Date: 01/27/2016    History of Present Illness pt is a pleasant 55 y/o female s/p TOTAL HIP ARTHROPLASTY ANTERIOR APPROACH (Left). PMH includes Right THR - anterior 12/2015, Multiple sclerosis, Spastic diplegia, DM, vision abnormalities, chronic fatigue, palpatations, DM, Dysesthesia, restless leg, hypertension    PT Comments    Pt performed limited gait and standing therapeutic exercise.  Pt became dizzy sitting edge of bed, returned to supine position and BP obtained.  Nurse aware and informed.  BP 101/59.  Follow Up Recommendations  Home health PT     Equipment Recommendations  None recommended by PT    Recommendations for Other Services       Precautions / Restrictions Precautions Precautions: Anterior Hip Restrictions Weight Bearing Restrictions: Yes LLE Weight Bearing: Weight bearing as tolerated    Mobility  Bed Mobility Overal bed mobility: Needs Assistance Bed Mobility: Sit to Supine     Supine to sit: Min assist (became dizzy.) Sit to supine: Min assist   General bed mobility comments: Pt performed with railing and assist to lift B LEs into the bed against gravity.    Transfers Overall transfer level: Modified independent Equipment used: Rolling walker (2 wheeled) Transfers: Sit to/from Stand Sit to Stand: Modified independent (Device/Increase time)         General transfer comment: Good technique.    Ambulation/Gait Ambulation/Gait assistance: Supervision Ambulation Distance (Feet): 65 Feet Assistive device: Rolling walker (2 wheeled) Gait Pattern/deviations: Step-to pattern;Step-through pattern;Trunk flexed;Antalgic     General Gait Details: Cues for forward gaze and progression to step through sequencing,     Stairs Stairs:  (Pt able to verbalize and recall method but refused to try stair negotiation during session.  )           Wheelchair Mobility    Modified Rankin (Stroke Patients Only)       Balance                                    Cognition Arousal/Alertness: Awake/alert Behavior During Therapy: WFL for tasks assessed/performed Overall Cognitive Status: Within Functional Limits for tasks assessed                      Exercises Total Joint Exercises Hip ABduction/ADduction: AROM;Left;5 reps;Standing Knee Flexion: AROM;Left;5 reps;Standing Marching in Standing: AROM;Left;5 reps;Standing Standing Hip Extension: AROM;Left;5 reps;Standing    General Comments        Pertinent Vitals/Pain Pain Assessment: 0-10 Pain Score: 7  Pain Location: L hip.   Pain Descriptors / Indicators: Aching Pain Intervention(s): Monitored during session;Repositioned    Home Living                      Prior Function            PT Goals (current goals can now be found in the care plan section) Acute Rehab PT Goals Patient Stated Goal: go back home PT Goal Formulation: With patient Potential to Achieve Goals: Good Progress towards PT goals: Progressing toward goals    Frequency    7X/week      PT Plan Current plan remains appropriate    Co-evaluation             End of Session Equipment Utilized During Treatment: Gait belt Activity Tolerance: Patient tolerated treatment well Patient left: in  chair;with call bell/phone within reach;with family/visitor present     Time: 1004-1030 PT Time Calculation (min) (ACUTE ONLY): 26 min  Charges:  $Gait Training: 8-22 mins $Therapeutic Exercise: 8-22 mins                    G Codes:      Cristela Blue 17-Feb-2016, 10:38 AM  Governor Rooks, PTA pager [phone]

## 2016-01-27 NOTE — Progress Notes (Signed)
Subjective: 3 Days Post-Op Procedure(s) (LRB): TOTAL HIP ARTHROPLASTY ANTERIOR APPROACH (Left) Patient reports pain as mild.  Had planned discharge but pt had decrease BP with tachy.  Angio CT neg for PE. Has been on aspirin, labs showed post op anemia from blood loss and one unit PRBC given with correction. Hgb now 8.0.   Objective: Vital signs in last 24 hours: Temp:  [98.3 F (36.8 C)-101.4 F (38.6 C)] 98.4 F (36.9 C) (09/25 0540) Pulse Rate:  [104-126] 104 (09/25 0540) Resp:  [16-18] 18 (09/25 0540) BP: (65-105)/(37-64) 91/59 (09/25 0540) SpO2:  [95 %-100 %] 98 % (09/25 0540)  Intake/Output from previous day: 09/24 0701 - 09/25 0700 In: 3197 [P.O.:240; I.V.:2287; Blood:670] Out: 2100 [Urine:2100] Intake/Output this shift: No intake/output data recorded.   Recent Labs  01/25/16 0558 01/26/16 0220 01/26/16 2048 01/27/16 0610  HGB 8.9* 8.8* 7.2* 8.0*    Recent Labs  01/26/16 2048 01/27/16 0610  WBC 8.7 7.3  RBC 2.60* 2.87*  HCT 22.0* 23.8*  PLT 114* 109*    Recent Labs  01/26/16 0220 01/26/16 2048  NA 136 131*  K 3.8 3.3*  CL 102 99*  CO2 26 25  BUN 10 12  CREATININE 1.01* 1.42*  GLUCOSE 156* 138*  CALCIUM 8.8* 7.9*   No results for input(s): LABPT, INR in the last 72 hours.  Neurologically intact  Assessment/Plan: 3 Days Post-Op Procedure(s) (LRB): TOTAL HIP ARTHROPLASTY ANTERIOR APPROACH (Left) Plan:  Will see how she does today with ambulation, if BP OK will possibly discharge. If she remains symptomatic getting up will give a 2nd unit of PRBC's.  IV at 90cc hr. NS.   Mary Strong 01/27/2016, 8:08 AM

## 2016-01-28 LAB — TYPE AND SCREEN
ABO/RH(D): A POS
ANTIBODY SCREEN: NEGATIVE
UNIT DIVISION: 0
UNIT DIVISION: 0

## 2016-01-28 LAB — HEMOGLOBIN AND HEMATOCRIT, BLOOD
HEMATOCRIT: 24.8 % — AB (ref 36.0–46.0)
HEMOGLOBIN: 7.9 g/dL — AB (ref 12.0–15.0)

## 2016-01-28 NOTE — Progress Notes (Signed)
Subjective: 4 Days Post-Op Procedure(s) (LRB): TOTAL HIP ARTHROPLASTY ANTERIOR APPROACH (Left) Patient reports pain as mild.    Objective: Vital signs in last 24 hours: Temp:  [98.7 F (37.1 C)-100 F (37.8 C)] 99.3 F (37.4 C) (09/26 0518) Pulse Rate:  [98-112] 101 (09/26 0518) Resp:  [16-18] 17 (09/26 0518) BP: (87-101)/(40-59) 94/53 (09/26 0518) SpO2:  [94 %-99 %] 96 % (09/26 0518)  Intake/Output from previous day: 09/25 0701 - 09/26 0700 In: 1615 [P.O.:240; I.V.:1040; Blood:335] Out: 900 [Urine:900] Intake/Output this shift: No intake/output data recorded.   Recent Labs  01/26/16 0220 01/26/16 2048 01/27/16 0610 01/27/16 1524 01/28/16 0449  HGB 8.8* 7.2* 8.0* 7.7* 7.9*    Recent Labs  01/26/16 2048 01/27/16 0610 01/27/16 1524 01/28/16 0449  WBC 8.7 7.3  --   --   RBC 2.60* 2.87*  --   --   HCT 22.0* 23.8* 24.0* 24.8*  PLT 114* 109*  --   --     Recent Labs  01/26/16 2048 01/27/16 1524  NA 131* 138  K 3.3* 3.5  CL 99* 106  CO2 25 26  BUN 12 9  CREATININE 1.42* 0.93  GLUCOSE 138* 109*  CALCIUM 7.9* 8.3*   No results for input(s): LABPT, INR in the last 72 hours.  Neurologically intact  Assessment/Plan: 4 Days Post-Op Procedure(s) (LRB): TOTAL HIP ARTHROPLASTY ANTERIOR APPROACH (Left) Discharge home with home health  Mary Strong 01/28/2016, 7:51 AM

## 2016-01-28 NOTE — Progress Notes (Signed)
Discharged pt to home, accompanied by spouse. Discharge instructions given, verbalized understanding.

## 2016-01-28 NOTE — Progress Notes (Signed)
Physical Therapy Treatment Patient Details Name: Mary Strong MRN: [id number] DOB: 1960-07-22 Today's Date: 01/28/2016    History of Present Illness pt is a pleasant 55 y/o female s/p TOTAL HIP ARTHROPLASTY ANTERIOR APPROACH (Left). PMH includes Right THR - anterior 12/2015, Multiple sclerosis, Spastic diplegia, DM, vision abnormalities, chronic fatigue, palpatations, DM, Dysesthesia, restless leg, hypertension    PT Comments    Pt reviewed HEP in prep for d/c home.  Pt is ready to d/c and RN informed.    Follow Up Recommendations  Home health PT (Pt declining HHPT)     Equipment Recommendations  None recommended by PT    Recommendations for Other Services       Precautions / Restrictions Precautions Precautions: Anterior Hip Restrictions Weight Bearing Restrictions: Yes LLE Weight Bearing: Weight bearing as tolerated    Mobility  Bed Mobility Overal bed mobility: Needs Assistance Bed Mobility: Sit to Supine     Supine to sit: Modified independent (Device/Increase time) Sit to supine: Modified independent (Device/Increase time)   General bed mobility comments: Good technique no assist needed.    Transfers Overall transfer level: Modified independent Equipment used: Rolling walker (2 wheeled) Transfers: Sit to/from Stand Sit to Stand: Modified independent (Device/Increase time)         General transfer comment: Good technique.    Ambulation/Gait Ambulation/Gait assistance:  (Not performed.  )               Stairs            Wheelchair Mobility    Modified Rankin (Stroke Patients Only)       Balance     Sitting balance-Leahy Scale: Normal       Standing balance-Leahy Scale: Fair                      Cognition Arousal/Alertness: Awake/alert Behavior During Therapy: WFL for tasks assessed/performed Overall Cognitive Status: Within Functional Limits for tasks assessed                      Exercises Total Joint  Exercises Ankle Circles/Pumps: AROM;Both;10 reps;Supine Quad Sets: AROM;Both;10 reps;Supine Short Arc Quad: AROM;Left;10 reps;Supine Heel Slides: AROM;Left;10 reps;Supine Hip ABduction/ADduction: AROM;Left;Supine;15 reps;Standing (1x10 in supine and 1x5 in standing.  ) Long Arc Quad: AROM;Left;10 reps;Seated Knee Flexion: AROM;Left;Standing;5 reps Marching in Standing: AROM;Left;5 reps;Standing Standing Hip Extension: AROM;Left;5 reps;Standing    General Comments        Pertinent Vitals/Pain Pain Assessment: 0-10 Pain Score: 5  Pain Location: L hip Pain Descriptors / Indicators: Aching Pain Intervention(s): Limited activity within patient's tolerance;Repositioned    Home Living                      Prior Function            PT Goals (current goals can now be found in the care plan section) Acute Rehab PT Goals Patient Stated Goal: go back home Potential to Achieve Goals: Good Progress towards PT goals: Progressing toward goals    Frequency    7X/week      PT Plan Current plan remains appropriate    Co-evaluation             End of Session   Activity Tolerance: Patient tolerated treatment well Patient left: in bed;with call bell/phone within reach;with family/visitor present     Time: 1000-1015 PT Time Calculation (min) (ACUTE ONLY): 15 min  Charges:  $Therapeutic Exercise: 8-22 mins  G Codes:      Cristela Blue 01/28/2016, 10:16 AM  Governor Rooks, PTA pager [phone]

## 2016-01-29 NOTE — Anesthesia Postprocedure Evaluation (Signed)
Anesthesia Post Note  Patient: Mary Strong  Procedure(s) Performed: Procedure(s) (LRB): TOTAL HIP ARTHROPLASTY ANTERIOR APPROACH (Left)  Patient location during evaluation: PACU Anesthesia Type: General Level of consciousness: awake and alert and patient cooperative Pain management: pain level controlled Vital Signs Assessment: post-procedure vital signs reviewed and stable Respiratory status: spontaneous breathing and respiratory function stable Cardiovascular status: stable Anesthetic complications: no    Last Vitals:  Vitals:   01/27/16 2043 01/28/16 0518  BP: (!) 91/53 (!) 94/53  Pulse: (!) 101 (!) 101  Resp: 17 17  Temp: 37.7 C 37.4 C    Last Pain:  Vitals:   01/28/16 0948  TempSrc:   PainSc: [place]

## 2016-02-03 ENCOUNTER — Telehealth: Payer: Self-pay | Admitting: Neurology

## 2016-02-03 NOTE — Telephone Encounter (Signed)
Pt called to advise she has been off fentaNYL (DURAGESIC) 25 MCG/HR patch since 01/26/16. She is wanting to know how long it will take for the withdrawal to subside. She wants to know if there is anything that will help.

## 2016-02-03 NOTE — Telephone Encounter (Signed)
I have spoken with Mary Strong today, and per RAS, advised that she shouldn't still be having w/d sx. from Fentanyl since she hasn't' had a patch on since 01-26-16.  She should continue her hs Valium prn.  She verbalized understanding of same/fim

## 2016-02-06 ENCOUNTER — Inpatient Hospital Stay (INDEPENDENT_AMBULATORY_CARE_PROVIDER_SITE_OTHER): Payer: Medicare Other | Admitting: Orthopaedic Surgery

## 2016-02-13 ENCOUNTER — Inpatient Hospital Stay (INDEPENDENT_AMBULATORY_CARE_PROVIDER_SITE_OTHER): Payer: Medicare Other | Admitting: Orthopaedic Surgery

## 2016-02-13 DIAGNOSIS — M1611 Unilateral primary osteoarthritis, right hip: Secondary | ICD-10-CM

## 2016-02-19 ENCOUNTER — Telehealth: Payer: Self-pay | Admitting: Neurology

## 2016-02-19 NOTE — Telephone Encounter (Signed)
RAS is ooo today.  Will check with him regarding Valium/Lunesta tomorrow and let pt. know something/fim

## 2016-02-19 NOTE — Telephone Encounter (Signed)
Patient called to advise, she is coming in tomorrow 10:00 for infusion, "needs new prescription for valium and would like to try generic of Lunesta for sleep", states she's still not sleeping.

## 2016-02-20 DIAGNOSIS — G35 Multiple sclerosis: Secondary | ICD-10-CM | POA: Diagnosis not present

## 2016-02-20 MED ORDER — ESZOPICLONE 2 MG PO TABS: 2.0000 mg | ORAL_TABLET | Freq: Every evening | ORAL | 5 refills | Status: DC | PRN

## 2016-02-20 MED ORDER — DIAZEPAM 5 MG PO TABS: ORAL_TABLET | ORAL | 5 refills | Status: DC

## 2016-02-20 NOTE — Addendum Note (Signed)
Addended by: France Ravens I on: 02/20/2016 11:11 AM   Modules accepted: Orders

## 2016-02-20 NOTE — Discharge Summary (Signed)
Patient ID: Mary Strong MRN: [id number] DOB/AGE: Nov 19, 1960 55 y.o.  Admit date: 01/24/2016 Discharge date: 02/20/2016  Admission Diagnoses:  Active Problems:   Arthritis of left hip   Discharge Diagnoses:  Active Problems:   Arthritis of left hip  status post Procedure(s): TOTAL HIP ARTHROPLASTY ANTERIOR APPROACH  Past Medical History:  Diagnosis Date  . Anxiety   . Arthritis   . Cancer ([place])    basal cell cancer on forehead, strong family hx of adenocarcinoma  . Complication of anesthesia    patient has MS  . Constipation   . Depression   . Dysrhythmia    echo -palpitations  . Family history of adverse reaction to anesthesia    Mom's bladder would not wake up, she has MS also.  . Fatty liver   . GERD (gastroesophageal reflux disease)   . Headache    migraines during teen-early 20's  . History of palpitations    none since weight loss  . Hypertension    on Lisinopril to protect kidneys, was eclamptic with both children, otherwise pt usually has low blood pressure  . Hyperthyroidism    thyroid has been irradiated  . Hypothyroidism   . Memory difficulties    due to MS  . Movement disorder   . Multiple sclerosis ([place])   . Neurogenic bladder   . Neuropathy ([place])   . Palpitations   . Restless leg syndrome   . Sleep apnea    does not use cpap,  dx yrs ago improved with weight loss  . Type 2 diabetes, diet controlled ([place])    diet controled  . Vision abnormalities     Surgeries: Procedure(s): TOTAL HIP ARTHROPLASTY ANTERIOR APPROACH on 01/24/2016   Consultants:   Discharged Condition: Improved  Hospital Course: Mary Strong is an 55 y.o. female who was admitted 01/24/2016 for operative treatment of hip DJD. Patient failed conservative treatments (please see the history and physical for the specifics) and had severe unremitting pain that affects sleep, daily activities and work/hobbies. After pre-op clearance, the patient was taken to the  operating room on 01/24/2016 and underwent  Procedure(s): TOTAL HIP ARTHROPLASTY ANTERIOR APPROACH.    Patient was given perioperative antibiotics:  Anti-infectives    Start     Dose/Rate Route Frequency Ordered Stop   01/24/16 1300  clindamycin (CLEOCIN) IVPB 600 mg     600 mg 100 mL/hr over 30 Minutes Intravenous Every 6 hours 01/24/16 1205 01/25/16 0247   01/24/16 0700  clindamycin (CLEOCIN) IVPB 900 mg     900 mg 100 mL/hr over 30 Minutes Intravenous To [facility] 01/23/16 1426 01/24/16 0739       Patient was given sequential compression devices and early ambulation to prevent DVT.   Patient benefited maximally from hospital stay and there were no complications. At the time of discharge, the patient was urinating/moving their bowels without difficulty, tolerating a regular diet, pain is controlled with oral pain medications and they have been cleared by PT/OT.   Recent vital signs: No data found.    Recent laboratory studies: No results for input(s): WBC, HGB, HCT, PLT, NA, K, CL, CO2, BUN, CREATININE, GLUCOSE, INR, CALCIUM in the last 72 hours.  Invalid input(s): PT, 2   Discharge Medications:     Medication List    STOP taking these medications   aspirin 81 MG tablet Replaced by:  aspirin 325 MG EC tablet   ibuprofen 800 MG tablet Commonly known as:  ADVIL,MOTRIN  TAKE these medications   aspirin 325 MG EC tablet Take 1 tablet (325 mg total) by mouth daily with breakfast. Replaces:  aspirin 81 MG tablet   Biotin 5000 MCG Caps Take 1 capsule by mouth daily.   clotrimazole 1 % cream Commonly known as:  LOTRIMIN Apply topically 2 (two) times daily.   diazepam 5 MG tablet Commonly known as:  VALIUM TAKE THREE TABLETS BY MOUTH AT BEDTIME What changed:  See the new instructions.   DULoxetine 60 MG capsule Commonly known as:  CYMBALTA Take 1 capsule (60 mg total) by mouth at bedtime.   fentaNYL 25 MCG/HR patch Commonly known as:  DURAGESIC Place  1 patch (25 mcg total) onto the skin every 3 (three) days.   gabapentin 600 MG tablet Commonly known as:  NEURONTIN TAKE ONE TABLET BY MOUTH THREE TIMES DAILY   levothyroxine 137 MCG tablet Commonly known as:  SYNTHROID, LEVOTHROID Take 137 mcg by mouth daily.   lisinopril-hydrochlorothiazide 20-12.5 MG tablet Commonly known as:  PRINZIDE,ZESTORETIC Take 1 tablet by mouth daily.   mometasone 0.1 % cream Commonly known as:  ELOCON Apply 1 application topically daily as needed (dermatosis).   natalizumab 300 MG/15ML injection Commonly known as:  TYSABRI Inject 300 mg into the vein every 30 (thirty) days.   omeprazole 20 MG capsule Commonly known as:  PRILOSEC Take 20 mg by mouth daily.   oxyCODONE-acetaminophen 5-325 MG tablet Commonly known as:  ROXICET Take 1-2 tablets by mouth every 6 (six) hours as needed for severe pain.   psyllium 58.6 % powder Commonly known as:  METAMUCIL Take 1 packet by mouth daily. Takes a capful   tamsulosin 0.4 MG Caps capsule Commonly known as:  FLOMAX Take 1 capsule (0.4 mg total) by mouth daily.       Diagnostic Studies: Ct Angio Chest Pe W Or Wo Contrast  Result Date: 01/26/2016 CLINICAL DATA:  Acute onset of generalized chest pain and shortness of breath. Recent hip replacement. Initial encounter. EXAM: CT ANGIOGRAPHY CHEST WITH CONTRAST TECHNIQUE: Multidetector CT imaging of the chest was performed using the standard protocol during bolus administration of intravenous contrast. Multiplanar CT image reconstructions and MIPs were obtained to evaluate the vascular anatomy. CONTRAST:  80 mL of Isovue 370 IV contrast COMPARISON:  Chest radiograph performed 01/25/2016 FINDINGS: Cardiovascular:  There is no evidence of pulmonary embolus. The heart is unremarkable in appearance. The thoracic aorta is grossly unremarkable. Scattered coronary artery calcifications are seen. The great vessels are grossly unremarkable in appearance. Mediastinum/Nodes:  The mediastinum is unremarkable in appearance. No mediastinal lymphadenopathy is seen. No pericardial effusion is identified. The thyroid gland is diminutive and not well seen. No axillary lymphadenopathy is appreciated. Lungs/Pleura: Minimal bibasilar atelectasis is noted. The lungs are otherwise grossly clear. No pleural effusion or pneumothorax is seen. No masses are identified. Upper Abdomen: The visualized portions of the liver are grossly unremarkable in appearance. The spleen is mildly enlarged, measuring 13.3 cm in length. Musculoskeletal: No acute osseous abnormalities are identified. The visualized musculature is unremarkable in appearance. Review of the MIP images confirms the above findings. IMPRESSION: 1. No evidence of pulmonary embolus. 2. Minimal bibasilar atelectasis noted.  Lungs otherwise clear. 3. Scattered coronary artery calcifications seen. 4. Mild splenomegaly. Electronically Signed   By: Garald Balding M.D.   On: 01/26/2016 22:07   Dg Pelvis Portable  Result Date: 01/24/2016 CLINICAL DATA:  Post left hip replacement EXAM: PORTABLE PELVIS 1-2 VIEWS COMPARISON:  12/04/2015 FINDINGS: Remote changes of right  hip replacement and new left hip replacement. No hardware bony complicating feature. IMPRESSION: New left hip replacement.  No visible complicating feature. Electronically Signed   By: Rolm Baptise M.D.   On: 01/24/2016 11:10   Dg Chest Port 1 View  Result Date: 01/25/2016 CLINICAL DATA:  Status post recent anterior hip replacement, with acute onset of fever. Initial encounter. EXAM: PORTABLE CHEST 1 VIEW COMPARISON:  Chest radiograph performed 12/06/2015 FINDINGS: The lungs are well-aerated and clear. There is no evidence of focal opacification, pleural effusion or pneumothorax. The cardiomediastinal silhouette is within normal limits. No acute osseous abnormalities are seen. Clips are noted within the right upper quadrant, reflecting prior cholecystectomy. IMPRESSION: No acute  cardiopulmonary process seen. No displaced rib fractures identified. Electronically Signed   By: Garald Balding M.D.   On: 01/25/2016 22:13   Dg C-arm 61-120 Min  Result Date: 01/24/2016 CLINICAL DATA:  Left hip replacement EXAM: OPERATIVE LEFT HIP (WITH PELVIS IF PERFORMED) 3 VIEWS TECHNIQUE: Fluoroscopic spot image(s) were submitted for interpretation post-operatively. COMPARISON:  None. FINDINGS: Changes of left hip replacement. Normal AP alignment. No visible hardware or bony complicating feature. IMPRESSION: Left hip replacement.  No complicating feature. Electronically Signed   By: Rolm Baptise M.D.   On: 01/24/2016 09:25   Dg Hip Operative Unilat W Or W/o Pelvis Left  Result Date: 01/24/2016 CLINICAL DATA:  Left hip replacement EXAM: OPERATIVE LEFT HIP (WITH PELVIS IF PERFORMED) 3 VIEWS TECHNIQUE: Fluoroscopic spot image(s) were submitted for interpretation post-operatively. COMPARISON:  None. FINDINGS: Changes of left hip replacement. Normal AP alignment. No visible hardware or bony complicating feature. IMPRESSION: Left hip replacement.  No complicating feature. Electronically Signed   By: Rolm Baptise M.D.   On: 01/24/2016 09:25        Discharge Plan:  discharge to home  Disposition:     Signed: Lanae Crumbly for mark yates md 02/20/2016, 10:27 AM

## 2016-02-20 NOTE — Telephone Encounter (Signed)
Per RAS, ok for generic Lunesta 2mg  po qhs prn sleep. Diazepam rx. printed as well.  Awaiting RAS sig/fim

## 2016-02-20 NOTE — Telephone Encounter (Signed)
Lunesta and Diazepam rx's given directly to Kim this morning, during Ty infusion/fim

## 2016-02-25 DIAGNOSIS — E8881 Metabolic syndrome: Secondary | ICD-10-CM | POA: Diagnosis not present

## 2016-02-25 DIAGNOSIS — K219 Gastro-esophageal reflux disease without esophagitis: Secondary | ICD-10-CM | POA: Diagnosis not present

## 2016-02-25 DIAGNOSIS — D649 Anemia, unspecified: Secondary | ICD-10-CM | POA: Diagnosis not present

## 2016-02-25 DIAGNOSIS — E782 Mixed hyperlipidemia: Secondary | ICD-10-CM | POA: Diagnosis not present

## 2016-02-25 DIAGNOSIS — E039 Hypothyroidism, unspecified: Secondary | ICD-10-CM | POA: Diagnosis not present

## 2016-02-25 DIAGNOSIS — E119 Type 2 diabetes mellitus without complications: Secondary | ICD-10-CM | POA: Diagnosis not present

## 2016-02-27 DIAGNOSIS — E119 Type 2 diabetes mellitus without complications: Secondary | ICD-10-CM | POA: Diagnosis not present

## 2016-02-27 DIAGNOSIS — E782 Mixed hyperlipidemia: Secondary | ICD-10-CM | POA: Diagnosis not present

## 2016-02-27 DIAGNOSIS — E6609 Other obesity due to excess calories: Secondary | ICD-10-CM | POA: Diagnosis not present

## 2016-02-27 DIAGNOSIS — E039 Hypothyroidism, unspecified: Secondary | ICD-10-CM | POA: Diagnosis not present

## 2016-02-27 DIAGNOSIS — Z23 Encounter for immunization: Secondary | ICD-10-CM | POA: Diagnosis not present

## 2016-02-27 DIAGNOSIS — Z6831 Body mass index (BMI) 31.0-31.9, adult: Secondary | ICD-10-CM | POA: Diagnosis not present

## 2016-02-27 DIAGNOSIS — E8881 Metabolic syndrome: Secondary | ICD-10-CM | POA: Diagnosis not present

## 2016-02-27 DIAGNOSIS — Z1389 Encounter for screening for other disorder: Secondary | ICD-10-CM | POA: Diagnosis not present

## 2016-03-18 ENCOUNTER — Encounter: Payer: Self-pay | Admitting: Neurology

## 2016-03-18 ENCOUNTER — Ambulatory Visit (INDEPENDENT_AMBULATORY_CARE_PROVIDER_SITE_OTHER): Payer: Medicare Other | Admitting: Neurology

## 2016-03-18 VITALS — BP 106/66 | HR 70 | Resp 16 | Ht 62.0 in | Wt 179.5 lb

## 2016-03-18 DIAGNOSIS — R3911 Hesitancy of micturition: Secondary | ICD-10-CM

## 2016-03-18 DIAGNOSIS — F329 Major depressive disorder, single episode, unspecified: Secondary | ICD-10-CM | POA: Diagnosis not present

## 2016-03-18 DIAGNOSIS — R269 Unspecified abnormalities of gait and mobility: Secondary | ICD-10-CM | POA: Insufficient documentation

## 2016-03-18 DIAGNOSIS — R5382 Chronic fatigue, unspecified: Secondary | ICD-10-CM | POA: Diagnosis not present

## 2016-03-18 DIAGNOSIS — G35 Multiple sclerosis: Secondary | ICD-10-CM | POA: Diagnosis not present

## 2016-03-18 DIAGNOSIS — R208 Other disturbances of skin sensation: Secondary | ICD-10-CM | POA: Diagnosis not present

## 2016-03-18 DIAGNOSIS — F32A Depression, unspecified: Secondary | ICD-10-CM

## 2016-03-18 MED ORDER — CLONAZEPAM 1 MG PO TABS
1.0000 mg | ORAL_TABLET | Freq: Every day | ORAL | 4 refills | Status: DC
Start: 2016-03-18 — End: 2016-08-27

## 2016-03-18 NOTE — Progress Notes (Signed)
[facility]  PATIENT: Mary Strong DOB: [birth year]  REFERRING CLINICIAN: Gar Ponto  HISTORY FROM: patient REASON FOR VISIT: MS and pain   HISTORICAL  CHIEF COMPLAINT:  Chief Complaint  Patient presents with  . Multiple Sclerosis    Sts. she feels bone issues she has had are due to Tysabri and she would like to discuss other tx. options. She has had both hips replaced this year, sts. next she needs a right shoulder replacement.  Sts. her pain is much improved with hip replacements, so she has stopped Fentanyl and Oxycodone./fim    HISTORY OF PRESENT ILLNESS:  Mary Strong is a 55 yo woman with MS and related symptoms.    She recently had hip replacement surgery bilaterally and feels gait is better.  MS History:   She is on Tysabri 300 mg every 4 week. She tolerates Tysabri ok but wonders if her bone pain and arthritic symptoms may be related to it. I discussed that I do not think that there is any relationship there.   Her JCV antibody was low positive (0.81) on 09/19/15.    She has not had any definite exacerbation recently.      Gait:  She reports some difficulty with her gait due to knees popping and giving out at times.   She ses a cane as she was falling some.    Her knees can be painful at times and that seems to affect her walking.    She also has more spasticity with more pain in the legs. She takes diazepam, 15 mg at bedtime which helps the spasticity.     Pain/sensation:    She had hip surgery August 2017 (R) and September 2017 (Left) and she is walkiing better and has less pain.   She also may need shoulder surgery.    She stopped percocet and fentanyl.   She is on gabapentin for dysesthetic pain and groin pain is better though she still has a lot of leg pain    She is also on duloxetine but she is unsure it helps.   She has less painful tingling in the hands and sometimes her fingers draw up a bit.  Spasticity is also painful at times.       Fatigue/Sleep:   She notes a lot of fatigue the past year, ,worse if she sleeps poorly.  She has both sleep onset and maintenance issues.   Johnnye Sima helps her fall asleep but she still wakes up a lot.  Gabapentin and valium helps her leg and back pain at nigth (does not always take)  Mood/cognitive:   She is noting some depression despite duloxetine 60 mg daily but is better than last year.   She has some crying spells.   She gets some agitiation at times.       Vision:   She denies definite MS related visual problem.  She gets intermittent film/veil on the right.     Bladder/bowel:   She has less hesitancy on tamsulosin .  She has no constipation since stopping opiates    MS History:   In 2005, she presented with least gait, muscle spasms and pain. SHe was found to have a transverse myelitis. Initially, she was placed on Avonex. Due to worsening symptoms, she was switched to Tysabri around 2010 or 2011 and had 52 doses. However, she had converted to JCV antibody positive status and her titer rose over the past couple of determinations. Because of that, the Tysabri  was discontinued as she had a higher risk of PML and she was started on Tecfidera .  She felt much better on Tysabri and went back on in 2016.   MRI of the brain just shows 2 nonspecific for intense foci an MRI of the cervical spine shows subtle foci at C6 and possibly C4.   REVIEW OF SYSTEMS:  Constitutional: No fevers, chills, sweats, or change in appetite.  She has fatigue Eyes: No visual changes, double vision, eye pain Ear, nose and throat: No hearing loss, ear pain, nasal congestion, sore throat Cardiovascular: No chest pain, palpitations Respiratory:  No shortness of breath at rest or with exertion.   No wheezes GastrointestinaI: No nausea, vomiting, diarrhea, abdominal pain, fecal incontinence Genitourinary:  No dysuria, urinary retention or frequency.  No nocturia. Musculoskeletal:  Pain is improved Integumentary: No rash,  pruritus, skin lesions Neurological: as above Psychiatric: She has depression and mild anxiety Endocrine: No palpitations, diaphoresis, change in appetite, change in weigh or increased thirst Hematologic/Lymphatic:  No anemia, purpura, petechiae. Allergic/Immunologic: No itchy/runny eyes, nasal congestion, recent allergic reactions, rashes  ALLERGIES: Allergies  Allergen Reactions  . Shellfish Allergy Anaphylaxis  . Ciprofloxacin Hives  . Copaxone [Glatiramer Acetate] Hives  . Penicillins Hives    Has patient had a PCN reaction causing immediate rash, facial/tongue/throat swelling, SOB or lightheadedness with hypotension:- -  rash Has patient had a PCN reaction causing severe rash involving mucus membranes or skin necrosis: no Has patient had a PCN reaction that required hospitalization {no Has patient had a PCN reaction occurring within the last 10 years: yes If all of the above answers are "NO", then may proceed with Cephalosporin use.  . Vancomycin Itching    HOME MEDICATIONS:   Current Outpatient Prescriptions:  .  aspirin EC 325 MG EC tablet, Take 1 tablet (325 mg total) by mouth daily with breakfast., Disp: 30 tablet, Rfl: 0 .  Biotin 5000 MCG CAPS, Take 1 capsule by mouth daily. , Disp: , Rfl:  .  diazepam (VALIUM) 5 MG tablet, TAKE THREE TABLETS BY MOUTH AT BEDTIME AS NEEDED FOR ANXIETY, Disp: 90 tablet, Rfl: 5 .  DULoxetine (CYMBALTA) 60 MG capsule, Take 1 capsule (60 mg total) by mouth at bedtime., Disp: 30 capsule, Rfl: 11 .  eszopiclone (LUNESTA) 2 MG TABS tablet, Take 1 tablet (2 mg total) by mouth at bedtime as needed for sleep. Take immediately before bedtime, Disp: 30 tablet, Rfl: 5 .  gabapentin (NEURONTIN) 600 MG tablet, TAKE ONE TABLET BY MOUTH THREE TIMES DAILY, Disp: 90 tablet, Rfl: 3 .  levothyroxine (SYNTHROID, LEVOTHROID) 137 MCG tablet, Take 137 mcg by mouth daily., Disp: , Rfl:  .  lisinopril-hydrochlorothiazide (PRINZIDE,ZESTORETIC) 20-12.5 MG per tablet,  Take 1 tablet by mouth daily. , Disp: , Rfl:  .  Melatonin 5 MG TABS, Take 20 mg by mouth., Disp: , Rfl:  .  mometasone (ELOCON) 0.1 % cream, Apply 1 application topically daily as needed (dermatosis). , Disp: , Rfl:  .  omeprazole (PRILOSEC) 20 MG capsule, Take 20 mg by mouth daily., Disp: , Rfl:  .  psyllium (METAMUCIL) 58.6 % powder, Take 1 packet by mouth daily. Takes a capful, Disp: , Rfl:  .  clonazePAM (KLONOPIN) 1 MG tablet, Take 1 tablet (1 mg total) by mouth at bedtime., Disp: 30 tablet, Rfl: 4 .  clotrimazole (LOTRIMIN) 1 % cream, Apply topically 2 (two) times daily. (Patient not taking: Reported on 03/18/2016), Disp: 30 g, Rfl: 0 .  fentaNYL ([place])  25 MCG/HR patch, Place 1 patch (25 mcg total) onto the skin every 3 (three) days. (Patient not taking: Reported on 03/18/2016), Disp: 10 patch, Rfl: 0 .  natalizumab (TYSABRI) 300 MG/15ML injection, Inject 300 mg into the vein every 30 (thirty) days., Disp: , Rfl:  .  oxyCODONE-acetaminophen (ROXICET) 5-325 MG tablet, Take 1-2 tablets by mouth every 6 (six) hours as needed for severe pain. (Patient not taking: Reported on 03/18/2016), Disp: 30 tablet, Rfl: 0 .  tamsulosin (FLOMAX) 0.4 MG CAPS capsule, Take 1 capsule (0.4 mg total) by mouth daily. (Patient not taking: Reported on 03/18/2016), Disp: 30 capsule, Rfl: 11   PAST MEDICAL HISTORY: Past Medical History:  Diagnosis Date  . Anxiety   . Arthritis   . Cancer ([place])    basal cell cancer on forehead, strong family hx of adenocarcinoma  . Complication of anesthesia    patient has MS  . Constipation   . Depression   . Dysrhythmia    echo -palpitations  . Family history of adverse reaction to anesthesia    Mom's bladder would not wake up, she has MS also.  . Fatty liver   . GERD (gastroesophageal reflux disease)   . Headache    migraines during teen-early 20's  . History of palpitations    none since weight loss  . Hypertension    on Lisinopril to protect kidneys, was  eclamptic with both children, otherwise pt usually has low blood pressure  . Hyperthyroidism    thyroid has been irradiated  . Hypothyroidism   . Memory difficulties    due to MS  . Movement disorder   . Multiple sclerosis ([place])   . Neurogenic bladder   . Neuropathy ([place])   . Palpitations   . Restless leg syndrome   . Sleep apnea    does not use cpap,  dx yrs ago improved with weight loss  . Type 2 diabetes, diet controlled ([place])    diet controled  . Vision abnormalities     PAST SURGICAL HISTORY: Past Surgical History:  Procedure Laterality Date  . CESAREAN SECTION     x 2  . CHOLECYSTECTOMY    . ENDOMETRIAL ABLATION    . JOINT REPLACEMENT    . TONSILLECTOMY    . TOTAL HIP ARTHROPLASTY Right 12/04/2015  . TOTAL HIP ARTHROPLASTY Right 12/04/2015   Procedure: TOTAL HIP ARTHROPLASTY ANTERIOR APPROACH;  Surgeon: Marybelle Killings, MD;  Location: [facility];  Service: Orthopedics;  Laterality: Right;  . TOTAL HIP ARTHROPLASTY Left 01/24/2016   Procedure: TOTAL HIP ARTHROPLASTY ANTERIOR APPROACH;  Surgeon: Marybelle Killings, MD;  Location: [facility];  Service: Orthopedics;  Laterality: Left;  anterior approach  . TUBAL LIGATION      FAMILY HISTORY: Family History  Problem Relation Age of Onset  . Multiple sclerosis Mother   . Arthritis/Rheumatoid Mother   . Psoriasis Father     SOCIAL HISTORY:  Social History   Social History  . Marital status: Married    Spouse name: N/A  . Number of children: N/A  . Years of education: N/A   Occupational History  . Not on file.   Social History Main Topics  . Smoking status: Never Smoker  . Smokeless tobacco: Never Used  . Alcohol use 0.0 oz/week     Comment: "none since 2014"  . Drug use:     Types: Marijuana     Comment: none in 6 minths  . Sexual activity: Not Currently   Other Topics  Concern  . Not on file   Social History Narrative  . No narrative on file     PHYSICAL EXAM  Vitals:   03/18/16 1108  BP: 106/66  Pulse: 70    Resp: 16  Weight: 179 lb 8 oz (81.4 kg)  Height: 5\' 2"  (1.575 m)    Body mass index is 32.83 kg/m.   General: The patient is well-developed and well-nourished and in no acute distress  Musculoskeletal:  Tender over lower back and hips. Left > right.  Knees are tender.  Neurologic Exam  Mental status: The patient is alert and oriented x 3 at the time of the examination. The patient has apparent normal recent and remote memory, with an apparently normal attention span and concentration ability.   Speech is normal.  Mood:  She is tearful initially and expresses frustration with her pain.    Cranial nerves: Extraocular movements are full. Pupils are equal, round, and reactive to light and accomodation.   Facial symmetry is present. There is good facial sensation to soft touch bilaterally.Facial strength is normal.  Trapezius and sternocleidomastoid strength is normal. No dysarthria is noted.    No obvious hearing deficits are noted.  Motor:  Muscle bulk is normal and tone is mildly increased in legs.   Strength is  5 / 5 in all 4 extremities.   Sensory: Sensory testing is intact to pinprick, soft touch, vibration sensation in arms but she reports decreased sensation in left leg to these modalities.     Coordination: Cerebellar testing reveals good finger-nose-finger   Gait and station: Station is normal and gait is arthritic and wide.   Tandem gait is mildly wide but improved since surgery. Romberg is negative.   Reflexes: Deep tendon reflexes are symmetric and 3+ bilaterally with spread at the knees      DIAGNOSTIC DATA (LABS, IMAGING, TESTING) - I reviewed patient records, labs, notes, testing and imaging myself where available.      ASSESSMENT AND PLAN  Multiple sclerosis (HCC)  Dysesthesia  Chronic fatigue  Gait disturbance  Depression, unspecified depression type  Urinary hesitancy  1.   Stop Tysabri.  She has had recent exacerbation and her plaque word in  his low, though she does have 2 foci in the spinal cord. She would like to stop DMT at this time but we discuissed we will need to check an MRI of the brain and cervical spine after she is off Tysabri for 6-9 months to make sure that there is no subclinical progression. If that is occurring she will need to go back on a treatment and we would consider one of the other disease modifying therapy such as ocrelizumab or Lemtrada.. 2  Change Valium to clonazepam plus gabapentin and melatonin at bedtime and continue duloxetine.    Mood is currently doing better than earlier this year. 3.   May be able to stop tamsulosin as no longer on opiate.  Resume if hesitancy returns. 4.   She will return in 4 months, sooner if new or worsening neurologic symptoms.      Richard A. Felecia Shelling, MD, PhD [id number], [id number] PM Certified in Neurology, Clinical Neurophysiology, Sleep Medicine, Pain Medicine and Neuroimaging  [facility] [address], [place], [place] [address] [phone]

## 2016-05-26 ENCOUNTER — Other Ambulatory Visit: Payer: Self-pay | Admitting: Neurology

## 2016-06-04 ENCOUNTER — Telehealth: Payer: Self-pay | Admitting: Neurology

## 2016-06-04 NOTE — Telephone Encounter (Signed)
I have spoken with Mary Strong this evening.  She c/o skin feeling hot/itchy, uncomfortable to wear clothes, tongue feels scalded, facial numbness from the nose down, increased fatigue onset 1 month ago and worse in the last week.  Will check with RAS and call her back tomorrow.  She is agreeable with this/fim

## 2016-06-04 NOTE — Telephone Encounter (Signed)
Patient stating off and on for the last month her skin feels hot but she feels cold and  has been consistent for over a week.

## 2016-06-05 MED ORDER — GABAPENTIN 800 MG PO TABS: 800.0000 mg | ORAL_TABLET | Freq: Three times a day (TID) | ORAL | 3 refills | Status: DC

## 2016-06-05 NOTE — Addendum Note (Signed)
Addended by: France Ravens I on: 06/05/2016 09:47 AM   Modules accepted: Orders

## 2016-06-05 NOTE — Telephone Encounter (Signed)
I have spoken with Mary Strong this morning.  She sts. Gabapentin 600mg  tid helps sx. some.  Lamictal has not helped in the past. Per RAS, ok to increase to 800mg  tid.  Mary Strong is agreeable. Rx. escribed to [facility] per her request/fim

## 2016-06-29 DIAGNOSIS — E782 Mixed hyperlipidemia: Secondary | ICD-10-CM | POA: Diagnosis not present

## 2016-06-29 DIAGNOSIS — E119 Type 2 diabetes mellitus without complications: Secondary | ICD-10-CM | POA: Diagnosis not present

## 2016-06-29 DIAGNOSIS — E039 Hypothyroidism, unspecified: Secondary | ICD-10-CM | POA: Diagnosis not present

## 2016-06-29 DIAGNOSIS — E8881 Metabolic syndrome: Secondary | ICD-10-CM | POA: Diagnosis not present

## 2016-06-29 DIAGNOSIS — D649 Anemia, unspecified: Secondary | ICD-10-CM | POA: Diagnosis not present

## 2016-06-29 DIAGNOSIS — K219 Gastro-esophageal reflux disease without esophagitis: Secondary | ICD-10-CM | POA: Diagnosis not present

## 2016-07-01 ENCOUNTER — Telehealth: Payer: Self-pay | Admitting: Neurology

## 2016-07-01 DIAGNOSIS — G35D Multiple sclerosis, unspecified: Secondary | ICD-10-CM

## 2016-07-01 DIAGNOSIS — G35 Multiple sclerosis: Secondary | ICD-10-CM

## 2016-07-01 DIAGNOSIS — R269 Unspecified abnormalities of gait and mobility: Secondary | ICD-10-CM

## 2016-07-01 DIAGNOSIS — E6609 Other obesity due to excess calories: Secondary | ICD-10-CM | POA: Diagnosis not present

## 2016-07-01 DIAGNOSIS — E119 Type 2 diabetes mellitus without complications: Secondary | ICD-10-CM | POA: Diagnosis not present

## 2016-07-01 DIAGNOSIS — K219 Gastro-esophageal reflux disease without esophagitis: Secondary | ICD-10-CM | POA: Diagnosis not present

## 2016-07-01 DIAGNOSIS — E782 Mixed hyperlipidemia: Secondary | ICD-10-CM | POA: Diagnosis not present

## 2016-07-01 DIAGNOSIS — R29898 Other symptoms and signs involving the musculoskeletal system: Secondary | ICD-10-CM

## 2016-07-01 DIAGNOSIS — F331 Major depressive disorder, recurrent, moderate: Secondary | ICD-10-CM | POA: Diagnosis not present

## 2016-07-01 DIAGNOSIS — E8881 Metabolic syndrome: Secondary | ICD-10-CM | POA: Diagnosis not present

## 2016-07-01 DIAGNOSIS — E039 Hypothyroidism, unspecified: Secondary | ICD-10-CM | POA: Diagnosis not present

## 2016-07-01 NOTE — Telephone Encounter (Signed)
Patient calling to discuss increasing problems with MS.

## 2016-07-01 NOTE — Telephone Encounter (Signed)
I have spoken with Mary Strong this afternoon.  She c/o more difficulty with gait/balance.  Sts. she is stumbling more.  Feels arm strength is decreased bilat. Sts. she feels like she's "in a fog" all the time, thinks cognition is some decreased.  She requested to repeat MRI brain and c/s.  Per RAS, ok for mri brain and c/s with and without contrast/fim

## 2016-07-01 NOTE — Addendum Note (Signed)
Addended by: France Ravens I on: 07/01/2016 04:25 PM   Modules accepted: Orders

## 2016-07-11 ENCOUNTER — Ambulatory Visit
Admission: RE | Admit: 2016-07-11 | Discharge: 2016-07-11 | Disposition: A | Discharge status: Home / Self Care | Payer: Medicare Other | Source: Ambulatory Visit | Attending: Neurology | Admitting: Neurology

## 2016-07-11 DIAGNOSIS — R269 Unspecified abnormalities of gait and mobility: Secondary | ICD-10-CM

## 2016-07-11 DIAGNOSIS — R29898 Other symptoms and signs involving the musculoskeletal system: Secondary | ICD-10-CM

## 2016-07-11 DIAGNOSIS — G35 Multiple sclerosis: Secondary | ICD-10-CM

## 2016-07-11 DIAGNOSIS — M4802 Spinal stenosis, cervical region: Secondary | ICD-10-CM | POA: Diagnosis not present

## 2016-07-11 MED ORDER — GADOBENATE DIMEGLUMINE 529 MG/ML IV SOLN
16.0000 mL | Freq: Once | INTRAVENOUS | Status: AC | PRN
  Administered 2016-07-11: 16 mL via INTRAVENOUS

## 2016-07-13 ENCOUNTER — Telehealth: Payer: Self-pay | Admitting: *Deleted

## 2016-07-13 NOTE — Telephone Encounter (Signed)
I have spoken with Maudie Mercury and per RAS, reviewed MRI results as below.  She verbalized understanding of same/fim

## 2016-07-13 NOTE — Telephone Encounter (Signed)
-----   Message from Britt Bottom, MD sent at 07/13/2016 11:17 AM EDT ----- Please  let her know that the MRI of the brain and cervical spine are unchanged from the previous studies. The MRI of the brain just shows a couple of spots and there were no MS plaques in the cervical spine. She does have some unchanged arthritic changes with mild spinal stenosis in the neck but not bad enough for surgery.

## 2016-07-29 ENCOUNTER — Ambulatory Visit: Payer: Self-pay | Admitting: Neurology

## 2016-08-03 ENCOUNTER — Encounter: Payer: Self-pay | Admitting: Neurology

## 2016-08-03 ENCOUNTER — Ambulatory Visit (INDEPENDENT_AMBULATORY_CARE_PROVIDER_SITE_OTHER): Payer: Medicare Other | Admitting: Neurology

## 2016-08-03 ENCOUNTER — Encounter (INDEPENDENT_AMBULATORY_CARE_PROVIDER_SITE_OTHER): Payer: Self-pay

## 2016-08-03 VITALS — BP 110/64 | HR 74 | Resp 16 | Ht 62.0 in | Wt 206.0 lb

## 2016-08-03 DIAGNOSIS — M25512 Pain in left shoulder: Secondary | ICD-10-CM | POA: Diagnosis not present

## 2016-08-03 DIAGNOSIS — G35 Multiple sclerosis: Secondary | ICD-10-CM

## 2016-08-03 DIAGNOSIS — R208 Other disturbances of skin sensation: Secondary | ICD-10-CM

## 2016-08-03 DIAGNOSIS — F329 Major depressive disorder, single episode, unspecified: Secondary | ICD-10-CM

## 2016-08-03 DIAGNOSIS — M25511 Pain in right shoulder: Secondary | ICD-10-CM

## 2016-08-03 DIAGNOSIS — G4700 Insomnia, unspecified: Secondary | ICD-10-CM | POA: Diagnosis not present

## 2016-08-03 DIAGNOSIS — F32A Depression, unspecified: Secondary | ICD-10-CM

## 2016-08-03 MED ORDER — DIAZEPAM 5 MG PO TABS: ORAL_TABLET | ORAL | 5 refills | Status: DC

## 2016-08-03 MED ORDER — OXCARBAZEPINE 300 MG PO TABS: 300.0000 mg | ORAL_TABLET | Freq: Two times a day (BID) | ORAL | 5 refills | Status: DC

## 2016-08-03 MED ORDER — PREDNISONE 10 MG PO TABS: ORAL_TABLET | ORAL | 0 refills | Status: DC

## 2016-08-03 NOTE — Progress Notes (Signed)
[facility]  PATIENT: Mary Strong DOB: 1961/02/11  REFERRING CLINICIAN: Gar Ponto  HISTORY FROM: patient REASON FOR VISIT: MS and pain   HISTORICAL  CHIEF COMPLAINT:  Chief Complaint  Patient presents with  . Multiple Sclerosis    Sts. doing well off of dmt now. Is unable to tolerate Gabapentin 800mg  tid for neuropathy, so is only taking it bid.  Would like to discuss other tx. options.  Did not do well on  Clonazepam, so she stopped it and restarted  Valium/fim    HISTORY OF PRESENT ILLNESS:  Mary Strong is a 56 yo woman with MS and related symptoms.    She recently had hip replacement surgery bilaterally and feels gait is better.  MS History:   She stopped Tysabri last year.   We re-checked the MRI's of the brain and cervical spine last month and there were no new lesions.  Her JCV antibody was low positive (0.81) on 09/19/15.    She has not had any definite exacerbation recently.    Hpowever, she notes more dysesthesia.  Gait:  She reports some difficulty with her gait due to knees popping and giving out at times.   She ses a cane as she was falling some.    Her knees can be painful at times and that seems to affect her walking.    She also has more spasticity with more pain in the legs. She takes diazepam, 15 mg at bedtime which helps the spasticity.     Pain/sensation:    She has had hip surgery August 2017 (R) and September 2017 (Left) and she now has less LBP and hip pain.  However, her dysesthetic tingling pan is worse.   Sometimes she gets very hot sensations. She stopped percocet and fentanyl.   She is on gabapentin for dysesthetic pain but usually does bid as tid makes her sleepy.       She is also on duloxetine but she is unsure it helps.   She has less painful tingling in the hands and sometimes her fingers draw up a bit.  Spasticity is also painful at times.     Lamotrigine made her feel sleepy.   She has not tried Trileptal or Tegretol.      Fatigue/Sleep:   She notes a lot of fatigue the past year, ,worse if she sleeps poorly.  She is sleeping better since starting Motrin at bedtime.  Gabapentin and valium helps her leg and back pain at nigth (does not always take).  Sometimes she wakes up with tingling or shoulder pain.     Mood/cognitive:   She is noting mild depression and moderate anxiety despite duloxetine 60 mg daily and valium tid.    She has had disturbing vivid dreams at times.  They seem worse if she takes 800 mg of gabapentin at night.   She gets some agitiation at times.       Vision:   She denies definite MS related visual problem.  She gets intermittent film/veil on the right.     Bladder/bowel:   She has less hesitancy on tamsulosin .  She has no constipation since stopping opiates  .  Her dermatologist will excise a basal cell on her forehead.    Her Mom has MS,  MS History:   In 2005, she presented with least gait, muscle spasms and pain. SHe was found to have a transverse myelitis. Initially, she was placed on Avonex. Due to worsening symptoms, she  was switched to Tysabri around 2010 or 2011 and had 52 doses. However, she had converted to JCV antibody positive status and her titer rose over the past couple of determinations. Because of that, the Tysabri was discontinued as she had a higher risk of PML and she was started on Tecfidera .  She felt much better on Tysabri and went back on in 2016.   MRI of the brain just shows 2 nonspecific for intense foci an MRI of the cervical spine shows subtle foci at C6 and possibly C4.   REVIEW OF SYSTEMS:  Constitutional: No fevers, chills, sweats, or change in appetite.  She has fatigue Eyes: No visual changes, double vision, eye pain Ear, nose and throat: No hearing loss, ear pain, nasal congestion, sore throat Cardiovascular: No chest pain, palpitations Respiratory:  No shortness of breath at rest or with exertion.   No wheezes GastrointestinaI: No nausea, vomiting,  diarrhea, abdominal pain, fecal incontinence Genitourinary:  No dysuria, urinary retention or frequency.  No nocturia. Musculoskeletal:  Pain is improved Integumentary: No rash, pruritus, skin lesions Neurological: as above Psychiatric: She has depression and mild anxiety Endocrine: No palpitations, diaphoresis, change in appetite, change in weigh or increased thirst Hematologic/Lymphatic:  No anemia, purpura, petechiae. Allergic/Immunologic: No itchy/runny eyes, nasal congestion, recent allergic reactions, rashes  ALLERGIES: Allergies  Allergen Reactions  . Shellfish Allergy Anaphylaxis  . Ciprofloxacin Hives  . Copaxone [Glatiramer Acetate] Hives  . Penicillins Hives    Has patient had a PCN reaction causing immediate rash, facial/tongue/throat swelling, SOB or lightheadedness with hypotension:- -  rash Has patient had a PCN reaction causing severe rash involving mucus membranes or skin necrosis: no Has patient had a PCN reaction that required hospitalization {no Has patient had a PCN reaction occurring within the last 10 years: yes If all of the above answers are "NO", then may proceed with Cephalosporin use.  . Vancomycin Itching    HOME MEDICATIONS:   Current Outpatient Prescriptions:  .  aspirin EC 325 MG EC tablet, Take 1 tablet (325 mg total) by mouth daily with breakfast., Disp: 30 tablet, Rfl: 0 .  Biotin 5000 MCG CAPS, Take 1 capsule by mouth daily. , Disp: , Rfl:  .  diazepam (VALIUM) 5 MG tablet, TAKE TID PRN, Disp: 90 tablet, Rfl: 5 .  DULoxetine (CYMBALTA) 60 MG capsule, Take 1 capsule (60 mg total) by mouth at bedtime., Disp: 30 capsule, Rfl: 11 .  gabapentin (NEURONTIN) 800 MG tablet, Take 1 tablet (800 mg total) by mouth 3 (three) times daily., Disp: 270 tablet, Rfl: 3 .  lisinopril-hydrochlorothiazide (PRINZIDE,ZESTORETIC) 20-12.5 MG per tablet, Take 1 tablet by mouth daily. , Disp: , Rfl:  .  mometasone (ELOCON) 0.1 % cream, Apply 1 application topically daily  as needed (dermatosis). , Disp: , Rfl:  .  omeprazole (PRILOSEC) 20 MG capsule, Take 20 mg by mouth daily., Disp: , Rfl:  .  clonazePAM (KLONOPIN) 1 MG tablet, Take 1 tablet (1 mg total) by mouth at bedtime. (Patient not taking: Reported on 08/03/2016), Disp: 30 tablet, Rfl: 4 .  eszopiclone (LUNESTA) 2 MG TABS tablet, Take 1 tablet (2 mg total) by mouth at bedtime as needed for sleep. Take immediately before bedtime (Patient not taking: Reported on 08/03/2016), Disp: 30 tablet, Rfl: 5 .  levothyroxine (SYNTHROID, LEVOTHROID) 100 MCG tablet, , Disp: , Rfl:  .  Melatonin 5 MG TABS, Take 20 mg by mouth., Disp: , Rfl:  .  Oxcarbazepine (TRILEPTAL) 300 MG tablet, Take 1  tablet (300 mg total) by mouth 2 (two) times daily., Disp: 60 tablet, Rfl: 5 .  predniSONE (DELTASONE) 10 MG tablet, Taper form 6 pills to one pill po over 6 days, Disp: 21 tablet, Rfl: 0 .  tamsulosin (FLOMAX) 0.4 MG CAPS capsule, Take 1 capsule (0.4 mg total) by mouth daily. (Patient not taking: Reported on 03/18/2016), Disp: 30 capsule, Rfl: 11   PAST MEDICAL HISTORY: Past Medical History:  Diagnosis Date  . Anxiety   . Arthritis   . Cancer ([place])    basal cell cancer on forehead, strong family hx of adenocarcinoma  . Complication of anesthesia    patient has MS  . Constipation   . Depression   . Dysrhythmia    echo -palpitations  . Family history of adverse reaction to anesthesia    Mom's bladder would not wake up, she has MS also.  . Fatty liver   . GERD (gastroesophageal reflux disease)   . Headache    migraines during teen-early 20's  . History of palpitations    none since weight loss  . Hypertension    on Lisinopril to protect kidneys, was eclamptic with both children, otherwise pt usually has low blood pressure  . Hyperthyroidism    thyroid has been irradiated  . Hypothyroidism   . Memory difficulties    due to MS  . Movement disorder   . Multiple sclerosis ([place])   . Neurogenic bladder   . Neuropathy ([place])     . Palpitations   . Restless leg syndrome   . Sleep apnea    does not use cpap,  dx yrs ago improved with weight loss  . Type 2 diabetes, diet controlled ([place])    diet controled  . Vision abnormalities     PAST SURGICAL HISTORY: Past Surgical History:  Procedure Laterality Date  . CESAREAN SECTION     x 2  . CHOLECYSTECTOMY    . ENDOMETRIAL ABLATION    . JOINT REPLACEMENT    . TONSILLECTOMY    . TOTAL HIP ARTHROPLASTY Right 12/04/2015  . TOTAL HIP ARTHROPLASTY Right 12/04/2015   Procedure: TOTAL HIP ARTHROPLASTY ANTERIOR APPROACH;  Surgeon: Marybelle Killings, MD;  Location: [facility];  Service: Orthopedics;  Laterality: Right;  . TOTAL HIP ARTHROPLASTY Left 01/24/2016   Procedure: TOTAL HIP ARTHROPLASTY ANTERIOR APPROACH;  Surgeon: Marybelle Killings, MD;  Location: [facility];  Service: Orthopedics;  Laterality: Left;  anterior approach  . TUBAL LIGATION      FAMILY HISTORY: Family History  Problem Relation Age of Onset  . Multiple sclerosis Mother   . Arthritis/Rheumatoid Mother   . Psoriasis Father     SOCIAL HISTORY:  Social History   Social History  . Marital status: Married    Spouse name: N/A  . Number of children: N/A  . Years of education: N/A   Occupational History  . Not on file.   Social History Main Topics  . Smoking status: Never Smoker  . Smokeless tobacco: Never Used  . Alcohol use 0.0 oz/week     Comment: "none since 2014"  . Drug use: Yes    Types: Marijuana     Comment: none in 6 minths  . Sexual activity: Not Currently   Other Topics Concern  . Not on file   Social History Narrative  . No narrative on file     PHYSICAL EXAM  Vitals:   08/03/16 1023  BP: 110/64  Pulse: 74  Resp: 16  Weight: 206  lb (93.4 kg)  Height: 5\' 2"  (1.575 m)    Body mass index is 37.68 kg/m.   General: The patient is well-developed and well-nourished and in no acute distress  Musculoskeletal:  She is tender over both shoulders. Mildly tender over the lower lumbar  paraspinal muscles.  Neurologic Exam  Mental status: The patient is alert and oriented x 3 at the time of the examination. The patient has apparent normal recent and remote memory, with an apparently normal attention span and concentration ability.   Speech is normal.  Mood:  She is tearful initially and expresses frustration with her pain.    Cranial nerves: Extraocular movements are full. Facial strength and sensation is normal.  Trapezius and sternocleidomastoid strength is normal. No dysarthria is noted.    No obvious hearing deficits are noted.  Motor:  Muscle bulk is normal and tone is mildly increased in legs.   Strength is  5 / 5 in all 4 extremities.   Sensory: Sensory testing is intact to pinprick, soft touch, vibration sensation in arms but she reports decreased sensation in left leg to these modalities.     Coordination: Cerebellar testing reveals good finger-nose-finger   Gait and station: Station is normal and gait is arthritic and wide.   Tandem gait is mildly wide. Romberg is negative.   Reflexes: Deep tendon reflexes are symmetric and 3+ bilaterally with spread at the knees      DIAGNOSTIC DATA (LABS, IMAGING, TESTING) - I reviewed patient records, labs, notes, testing and imaging myself where available.      ASSESSMENT AND PLAN  Multiple sclerosis (H[facility])  Dysesthesia  Depression, unspecified depression type  Insomnia, unspecified type  Bilateral shoulder pain, unspecified chronicity  1.   She will remain off Tysabri.   We will recheck MRIs next year to rulke out subclinical progression.  2.   Continue diazepam and gabapentin and duloxetine..    Mood is currently doing better than earlier this year. 3.   May be able to stop tamsulosin as no longer on opiate.  Resume if hesitancy returns. 4.   She will return in 4 months, sooner if new or worsening neurologic symptoms.      Cesar Rogerson A. Felecia Shelling, MD, PhD 01/06/1739, 81:44 AM Certified in Neurology, Clinical  Neurophysiology, Sleep Medicine, Pain Medicine and Neuroimaging  [facility] [address] [place], [place] [address] [phone]'

## 2016-08-24 DIAGNOSIS — C44319 Basal cell carcinoma of skin of other parts of face: Secondary | ICD-10-CM | POA: Diagnosis not present

## 2016-08-26 ENCOUNTER — Other Ambulatory Visit: Payer: Self-pay | Admitting: Neurology

## 2016-08-27 ENCOUNTER — Other Ambulatory Visit: Payer: Self-pay | Admitting: *Deleted

## 2016-08-28 ENCOUNTER — Other Ambulatory Visit: Payer: Self-pay | Admitting: Neurology

## 2016-09-15 ENCOUNTER — Ambulatory Visit: Payer: Medicare Other | Admitting: Neurology

## 2016-10-02 DIAGNOSIS — F331 Major depressive disorder, recurrent, moderate: Secondary | ICD-10-CM | POA: Diagnosis not present

## 2016-10-02 DIAGNOSIS — E119 Type 2 diabetes mellitus without complications: Secondary | ICD-10-CM | POA: Diagnosis not present

## 2016-10-02 DIAGNOSIS — E039 Hypothyroidism, unspecified: Secondary | ICD-10-CM | POA: Diagnosis not present

## 2016-10-02 DIAGNOSIS — K219 Gastro-esophageal reflux disease without esophagitis: Secondary | ICD-10-CM | POA: Diagnosis not present

## 2016-10-02 DIAGNOSIS — E8881 Metabolic syndrome: Secondary | ICD-10-CM | POA: Diagnosis not present

## 2016-10-02 DIAGNOSIS — Z9189 Other specified personal risk factors, not elsewhere classified: Secondary | ICD-10-CM | POA: Diagnosis not present

## 2016-10-02 DIAGNOSIS — E782 Mixed hyperlipidemia: Secondary | ICD-10-CM | POA: Diagnosis not present

## 2016-10-12 DIAGNOSIS — E8881 Metabolic syndrome: Secondary | ICD-10-CM | POA: Diagnosis not present

## 2016-10-12 DIAGNOSIS — F331 Major depressive disorder, recurrent, moderate: Secondary | ICD-10-CM | POA: Diagnosis not present

## 2016-10-12 DIAGNOSIS — E039 Hypothyroidism, unspecified: Secondary | ICD-10-CM | POA: Diagnosis not present

## 2016-10-12 DIAGNOSIS — E119 Type 2 diabetes mellitus without complications: Secondary | ICD-10-CM | POA: Diagnosis not present

## 2016-10-12 DIAGNOSIS — M545 Low back pain: Secondary | ICD-10-CM | POA: Diagnosis not present

## 2016-10-12 DIAGNOSIS — E6609 Other obesity due to excess calories: Secondary | ICD-10-CM | POA: Diagnosis not present

## 2016-10-12 DIAGNOSIS — R300 Dysuria: Secondary | ICD-10-CM | POA: Diagnosis not present

## 2016-10-12 DIAGNOSIS — Z9189 Other specified personal risk factors, not elsewhere classified: Secondary | ICD-10-CM | POA: Diagnosis not present

## 2016-10-12 DIAGNOSIS — E782 Mixed hyperlipidemia: Secondary | ICD-10-CM | POA: Diagnosis not present

## 2016-10-12 DIAGNOSIS — Z6834 Body mass index (BMI) 34.0-34.9, adult: Secondary | ICD-10-CM | POA: Diagnosis not present

## 2016-10-12 DIAGNOSIS — G35 Multiple sclerosis: Secondary | ICD-10-CM | POA: Diagnosis not present

## 2016-10-12 DIAGNOSIS — K219 Gastro-esophageal reflux disease without esophagitis: Secondary | ICD-10-CM | POA: Diagnosis not present

## 2016-10-13 DIAGNOSIS — Z9049 Acquired absence of other specified parts of digestive tract: Secondary | ICD-10-CM | POA: Diagnosis not present

## 2016-10-13 DIAGNOSIS — R109 Unspecified abdominal pain: Secondary | ICD-10-CM | POA: Diagnosis not present

## 2016-10-13 DIAGNOSIS — R1084 Generalized abdominal pain: Secondary | ICD-10-CM | POA: Diagnosis not present

## 2016-10-13 DIAGNOSIS — R1110 Vomiting, unspecified: Secondary | ICD-10-CM | POA: Diagnosis not present

## 2016-10-20 ENCOUNTER — Telehealth: Payer: Self-pay | Admitting: Neurology

## 2016-10-20 NOTE — Telephone Encounter (Signed)
I have spoken with Mary Strong this afternoon and explained that MS would not be causing/worsening type 2 DM.  She verbalized understanding of same/fim

## 2016-10-20 NOTE — Telephone Encounter (Signed)
Patient called and requested to speak with Faith RN regarding her starting insulin. She states that she began taking insulin around two week ago. She states that within three months her sugar had gone up to 600 and her A1C is 12.6. She is would like to know if this could be associated with MS. Please call and advise.

## 2016-11-12 DIAGNOSIS — Z6834 Body mass index (BMI) 34.0-34.9, adult: Secondary | ICD-10-CM | POA: Diagnosis not present

## 2016-11-12 DIAGNOSIS — L304 Erythema intertrigo: Secondary | ICD-10-CM | POA: Diagnosis not present

## 2016-11-12 DIAGNOSIS — E1165 Type 2 diabetes mellitus with hyperglycemia: Secondary | ICD-10-CM | POA: Diagnosis not present

## 2017-01-11 DIAGNOSIS — E039 Hypothyroidism, unspecified: Secondary | ICD-10-CM | POA: Diagnosis not present

## 2017-01-11 DIAGNOSIS — E782 Mixed hyperlipidemia: Secondary | ICD-10-CM | POA: Diagnosis not present

## 2017-01-11 DIAGNOSIS — E1165 Type 2 diabetes mellitus with hyperglycemia: Secondary | ICD-10-CM | POA: Diagnosis not present

## 2017-01-11 DIAGNOSIS — K219 Gastro-esophageal reflux disease without esophagitis: Secondary | ICD-10-CM | POA: Diagnosis not present

## 2017-01-11 DIAGNOSIS — D649 Anemia, unspecified: Secondary | ICD-10-CM | POA: Diagnosis not present

## 2017-01-13 DIAGNOSIS — Z0001 Encounter for general adult medical examination with abnormal findings: Secondary | ICD-10-CM | POA: Diagnosis not present

## 2017-01-13 DIAGNOSIS — Z6836 Body mass index (BMI) 36.0-36.9, adult: Secondary | ICD-10-CM | POA: Diagnosis not present

## 2017-01-13 DIAGNOSIS — Z9189 Other specified personal risk factors, not elsewhere classified: Secondary | ICD-10-CM | POA: Diagnosis not present

## 2017-01-13 DIAGNOSIS — G35 Multiple sclerosis: Secondary | ICD-10-CM | POA: Diagnosis not present

## 2017-01-13 DIAGNOSIS — F331 Major depressive disorder, recurrent, moderate: Secondary | ICD-10-CM | POA: Diagnosis not present

## 2017-01-13 DIAGNOSIS — E119 Type 2 diabetes mellitus without complications: Secondary | ICD-10-CM | POA: Diagnosis not present

## 2017-01-13 DIAGNOSIS — E039 Hypothyroidism, unspecified: Secondary | ICD-10-CM | POA: Diagnosis not present

## 2017-01-13 DIAGNOSIS — Z23 Encounter for immunization: Secondary | ICD-10-CM | POA: Diagnosis not present

## 2017-01-13 DIAGNOSIS — E782 Mixed hyperlipidemia: Secondary | ICD-10-CM | POA: Diagnosis not present

## 2017-02-04 ENCOUNTER — Ambulatory Visit (INDEPENDENT_AMBULATORY_CARE_PROVIDER_SITE_OTHER): Payer: Medicare Other | Admitting: Neurology

## 2017-02-04 ENCOUNTER — Encounter: Payer: Self-pay | Admitting: Neurology

## 2017-02-04 VITALS — BP 110/75 | HR 92 | Resp 18 | Ht 62.0 in | Wt 202.0 lb

## 2017-02-04 DIAGNOSIS — M5416 Radiculopathy, lumbar region: Secondary | ICD-10-CM | POA: Diagnosis not present

## 2017-02-04 DIAGNOSIS — R3911 Hesitancy of micturition: Secondary | ICD-10-CM

## 2017-02-04 DIAGNOSIS — R269 Unspecified abnormalities of gait and mobility: Secondary | ICD-10-CM | POA: Diagnosis not present

## 2017-02-04 DIAGNOSIS — M5126 Other intervertebral disc displacement, lumbar region: Secondary | ICD-10-CM

## 2017-02-04 DIAGNOSIS — R208 Other disturbances of skin sensation: Secondary | ICD-10-CM

## 2017-02-04 DIAGNOSIS — G35 Multiple sclerosis: Secondary | ICD-10-CM

## 2017-02-04 MED ORDER — DIAZEPAM 5 MG PO TABS: ORAL_TABLET | ORAL | 5 refills | Status: DC

## 2017-02-04 NOTE — Progress Notes (Signed)
[facility]  PATIENT: Mary Strong DOB: May 01, 1961  REFERRING CLINICIAN: Gar Ponto  HISTORY FROM: patient REASON FOR VISIT: MS and pain   HISTORICAL  CHIEF COMPLAINT:  Chief Complaint  Patient presents with  . Multiple Sclerosis    Remains off of MS dmt.  Sts. she is having more weakness, burning, tingling right arm. Also c/o numbness to perineum.  Unable to tell when she's voiding.  Trouble starting stream but doesn't want med for it.Over the summer, she had uncontrolled blood sugars.  Believes it was the Oxcarbazepine.  Once she stopped it, blood sugars normalized./fim    HISTORY OF PRESENT ILLNESS:  Mary Strong is a 56 yo woman with MS and related symptoms.     Update 02/04/2017: She remains off of any disease modifying therapy. The MRI of the brain and cervical spine on 07/11/2016 did not show any new lesions. She had been on Tysabri and her JCV antibody was low positive. She stopped Tysabri in late 2017  .  She reports numbness and tingling/burning in the right arm, worse if she lays on that side, better if she lays on left side.   It seems to weaken with more activity.   She reports reduced grip.    Occasioanally, the left arm is numb.    She feels less sensation in her perineum when she urinates.   Legs are not numb.    She reports rectal/anal pain about once every 2 weeks.   She notes more upper back and lower back pain.    Cervical spine MRI 07/2016 shows mild multilevel DJD and mild spinal stenosis at C5-C6 and C6-C7 but these changes were stable and there did not appear to be nerve root impingement.       MRI 11/15/2003 of the lumbar spine showed multilevel DJD.    There was an HNP at L4L5 abutting the L5 nerve root and small T11-T12 HNP.    Her gait is doing ok.  Oxcarbazepine had helped her dysesthesia but was stopped as it might have worsened her sugars.    Since her hip surgery but she gets pain in her legs as she walks further and starts to  feel weaker in her legs.  She has urinary urgency and some incontinence at times. She has trouble starting her stream.   Tamsulosin helped slightly but not much.    She reports no perineal sensation during sex.      Vision is stable.    Fatigue was worse with the heat.  She is sleeping well and mood is doing fairly well.  Cognition is fine.   She has been a borderline diabetic and glucose was elevated to 600 three months ago.    She was placed on insulin.   She stopped oxcarbazepine and her sugars improved but were still > 200.   She was placed on metformin 1000 mg nightly  ____________________________________________ From 08/03/2016  MS History:   She stopped Tysabri last year.   We re-checked the MRI's of the brain and cervical spine last month and there were no new lesions.  Her JCV antibody was low positive (0.81) on 09/19/15.    She has not had any definite exacerbation recently.    However, she notes more dysesthesia.  Gait:  She reports some difficulty with her gait due to knees popping and giving out at times.   She ses a cane as she was falling some.    Her knees can be painful at  times and that seems to affect her walking.    She also has more spasticity with more pain in the legs. She takes diazepam, 15 mg at bedtime which helps the spasticity.     Pain/sensation:    She has had hip surgery August 2017 (R) and September 2017 (Left) and she now has less LBP and hip pain.  However, her dysesthetic tingling pan is worse.   Sometimes she gets very hot sensations. She stopped percocet and fentanyl.   She is on gabapentin for dysesthetic pain but usually does bid as tid makes her sleepy.       She is also on duloxetine but she is unsure it helps.   She has less painful tingling in the hands and sometimes her fingers draw up a bit.  Spasticity is also painful at times.     Lamotrigine made her feel sleepy.   She has not tried Trileptal or Tegretol.     Fatigue/Sleep:   She notes a lot of fatigue the  past year, ,worse if she sleeps poorly.  She is sleeping better since starting Motrin at bedtime.  Gabapentin and valium helps her leg and back pain at nigth (does not always take).  Sometimes she wakes up with tingling or shoulder pain.     Mood/cognitive:   She is noting mild depression and moderate anxiety despite duloxetine 60 mg daily and valium tid.    She has had disturbing vivid dreams at times.  They seem worse if she takes 800 mg of gabapentin at night.   She gets some agitiation at times.       Vision:   She denies definite MS related visual problem.  She gets intermittent film/veil on the right.     Bladder/bowel:   She has less hesitancy on tamsulosin .  She has no constipation since stopping opiates  .  Her dermatologist will excise a basal cell on her forehead.    Her Mom has MS,  MS History:   In 2005, she presented with least gait, muscle spasms and pain. SHe was found to have a transverse myelitis. Initially, she was placed on Avonex. Due to worsening symptoms, she was switched to Tysabri around 2010 or 2011 and had 52 doses. However, she had converted to JCV antibody positive status and her titer rose over the past couple of determinations. Because of that, the Tysabri was discontinued as she had a higher risk of PML and she was started on Tecfidera .  She felt much better on Tysabri and went back on in 2016.   MRI of the brain just shows 2 nonspecific for intense foci an MRI of the cervical spine shows subtle foci at C6 and possibly C4.   REVIEW OF SYSTEMS:  Constitutional: No fevers, chills, sweats, or change in appetite.  She has fatigue Eyes: No visual changes, double vision, eye pain Ear, nose and throat: No hearing loss, ear pain, nasal congestion, sore throat Cardiovascular: No chest pain, palpitations Respiratory:  No shortness of breath at rest or with exertion.   No wheezes GastrointestinaI: No nausea, vomiting, diarrhea, abdominal pain, fecal  incontinence Genitourinary:  No dysuria, urinary retention or frequency.  No nocturia. Musculoskeletal:  Pain is improved Integumentary: No rash, pruritus, skin lesions Neurological: as above Psychiatric: She has depression and mild anxiety Endocrine: No palpitations, diaphoresis, change in appetite, change in weigh or increased thirst Hematologic/Lymphatic:  No anemia, purpura, petechiae. Allergic/Immunologic: No itchy/runny eyes, nasal congestion, recent allergic reactions, rashes  ALLERGIES: Allergies  Allergen Reactions  . Shellfish Allergy Anaphylaxis  . Ciprofloxacin Hives  . Copaxone [Glatiramer Acetate] Hives  . Penicillins Hives    Has patient had a PCN reaction causing immediate rash, facial/tongue/throat swelling, SOB or lightheadedness with hypotension:- -  rash Has patient had a PCN reaction causing severe rash involving mucus membranes or skin necrosis: no Has patient had a PCN reaction that required hospitalization {no Has patient had a PCN reaction occurring within the last 10 years: yes If all of the above answers are "NO", then may proceed with Cephalosporin use.  . Vancomycin Itching    HOME MEDICATIONS:   Current Outpatient Prescriptions:  .  aspirin EC 325 MG EC tablet, Take 1 tablet (325 mg total) by mouth daily with breakfast., Disp: 30 tablet, Rfl: 0 .  Biotin 5000 MCG CAPS, Take 1 capsule by mouth daily. , Disp: , Rfl:  .  diazepam (VALIUM) 5 MG tablet, TAKE TID PRN, Disp: 90 tablet, Rfl: 5 .  DULoxetine (CYMBALTA) 60 MG capsule, Take 1 capsule (60 mg total) by mouth at bedtime., Disp: 30 capsule, Rfl: 11 .  gabapentin (NEURONTIN) 800 MG tablet, Take 1 tablet (800 mg total) by mouth 3 (three) times daily., Disp: 270 tablet, Rfl: 3 .  levothyroxine (SYNTHROID, LEVOTHROID) 100 MCG tablet, , Disp: , Rfl:  .  lisinopril-hydrochlorothiazide (PRINZIDE,ZESTORETIC) 20-12.5 MG per tablet, Take 1 tablet by mouth daily. , Disp: , Rfl:  .  mometasone (ELOCON) 0.1 %  cream, Apply 1 application topically daily as needed (dermatosis). , Disp: , Rfl:  .  omeprazole (PRILOSEC) 20 MG capsule, Take 20 mg by mouth daily., Disp: , Rfl:  .  losartan-hydrochlorothiazide (HYZAAR) 100-12.5 MG tablet, , Disp: , Rfl:    PAST MEDICAL HISTORY: Past Medical History:  Diagnosis Date  . Anxiety   . Arthritis   . Cancer ([place])    basal cell cancer on forehead, strong family hx of adenocarcinoma  . Complication of anesthesia    patient has MS  . Constipation   . Depression   . Dysrhythmia    echo -palpitations  . Family history of adverse reaction to anesthesia    Mom's bladder would not wake up, she has MS also.  . Fatty liver   . GERD (gastroesophageal reflux disease)   . Headache    migraines during teen-early 20's  . History of palpitations    none since weight loss  . Hypertension    on Lisinopril to protect kidneys, was eclamptic with both children, otherwise pt usually has low blood pressure  . Hyperthyroidism    thyroid has been irradiated  . Hypothyroidism   . Memory difficulties    due to MS  . Movement disorder   . Multiple sclerosis ([place])   . Neurogenic bladder   . Neuropathy   . Palpitations   . Restless leg syndrome   . Sleep apnea    does not use cpap,  dx yrs ago improved with weight loss  . Type 2 diabetes, diet controlled ([place])    diet controled  . Vision abnormalities     PAST SURGICAL HISTORY: Past Surgical History:  Procedure Laterality Date  . CESAREAN SECTION     x 2  . CHOLECYSTECTOMY    . ENDOMETRIAL ABLATION    . JOINT REPLACEMENT    . TONSILLECTOMY    . TOTAL HIP ARTHROPLASTY Right 12/04/2015  . TOTAL HIP ARTHROPLASTY Right 12/04/2015   Procedure: TOTAL HIP ARTHROPLASTY ANTERIOR APPROACH;  Surgeon:  Marybelle Killings, MD;  Location: [facility];  Service: Orthopedics;  Laterality: Right;  . TOTAL HIP ARTHROPLASTY Left 01/24/2016   Procedure: TOTAL HIP ARTHROPLASTY ANTERIOR APPROACH;  Surgeon: Marybelle Killings, MD;  Location: [facility];   Service: Orthopedics;  Laterality: Left;  anterior approach  . TUBAL LIGATION      FAMILY HISTORY: Family History  Problem Relation Age of Onset  . Multiple sclerosis Mother   . Arthritis/Rheumatoid Mother   . Psoriasis Father     SOCIAL HISTORY:  Social History   Social History  . Marital status: Married    Spouse name: N/A  . Number of children: N/A  . Years of education: N/A   Occupational History  . Not on file.   Social History Main Topics  . Smoking status: Never Smoker  . Smokeless tobacco: Never Used  . Alcohol use 0.0 oz/week     Comment: "none since 2014"  . Drug use: Yes    Types: Marijuana     Comment: none in 6 minths  . Sexual activity: Not Currently   Other Topics Concern  . Not on file   Social History Narrative  . No narrative on file     PHYSICAL EXAM  Vitals:   02/04/17 1108  BP: 110/75  Pulse: 92  Resp: 18  Weight: 202 lb (91.6 kg)  Height: 5\' 2"  (1.575 m)    Body mass index is 36.95 kg/m.   General: The patient is well-developed and well-nourished and in no acute distress  Musculoskeletal:  She is tender over both shoulders. Mildly tender over the lower lumbar paraspinal muscles.  Neurologic Exam  Mental status: The patient is alert and oriented x 3 at the time of the examination. The patient has apparent normal recent and remote memory, with an apparently normal attention span and concentration ability.   Speech is normal.  Mood:  She is tearful initially and expresses frustration with her pain.    Cranial nerves: Extraocular movements are full. Facial strength and sensation is normal.  Trapezius and sternocleidomastoid strength is normal. No dysarthria is noted.    No obvious hearing deficits are noted.  Motor:  Muscle bulk is normal and tone is mildly increased in legs.   Strength is  5 / 5 in all 4 extremities.   Sensory: She has mild decreased vibration sensation in her left leg..     Coordination: Cerebellar testing  reveals good finger-nose-finger   Gait and station: Station is normal and gait is arthritic and wide.   Tandem gait is wide. Romberg is negative.   Reflexes: Deep tendon reflexes are symmetric and 3+ bilaterally with spread at the knees      DIAGNOSTIC DATA (LABS, IMAGING, TESTING) - I reviewed patient records, labs, notes, testing and imaging myself where available.      ASSESSMENT AND PLAN  Gait disturbance  Multiple sclerosis (H[facility])  Urinary hesitancy  Lumbar radiculopathy  HNP (herniated nucleus pulposus), lumbar  Dysesthesia   1.   We will check MRI of the thoracic to rule out myelopathy and MS changes sources of her groin numbness. We will check an MRI of the lumbar spine to make sure that there is not a conus syndrome causing her groin numbness and urinary symptoms.   We discussed that if there is evidence of new MS lesions in her thoracic spine that we will need to reconsider disease modifying therapies. 2.   Continue diazepam and gabapentin and duloxetine.    Renew  diazepam. 3.   Try to stay active and exercises as tolerated. 4.   She will return in 4 months, sooner if new or worsening neurologic symptoms.      40 minutes face-to-face evaluation with greater than 40 minutes counseling or coordinating care about her new neurologic symptoms and disease modifying therapies will MS.  Alyson Ki A. Felecia Shelling, MD, PhD 72/11/6182, 85:92 AM Certified in Neurology, Clinical Neurophysiology, Sleep Medicine, Pain Medicine and Neuroimaging  [facility] [address], [place], [place] [address] [phone]'

## 2017-02-21 ENCOUNTER — Ambulatory Visit
Admission: RE | Admit: 2017-02-21 | Discharge: 2017-02-21 | Disposition: A | Discharge status: Home / Self Care | Payer: Medicare Other | Source: Ambulatory Visit | Attending: Neurology | Admitting: Neurology

## 2017-02-21 DIAGNOSIS — R3911 Hesitancy of micturition: Secondary | ICD-10-CM | POA: Diagnosis not present

## 2017-02-21 DIAGNOSIS — M5124 Other intervertebral disc displacement, thoracic region: Secondary | ICD-10-CM | POA: Diagnosis not present

## 2017-02-21 DIAGNOSIS — M48061 Spinal stenosis, lumbar region without neurogenic claudication: Secondary | ICD-10-CM | POA: Diagnosis not present

## 2017-02-21 DIAGNOSIS — G35 Multiple sclerosis: Secondary | ICD-10-CM

## 2017-02-21 DIAGNOSIS — M5126 Other intervertebral disc displacement, lumbar region: Secondary | ICD-10-CM

## 2017-02-21 DIAGNOSIS — M5416 Radiculopathy, lumbar region: Secondary | ICD-10-CM

## 2017-02-21 DIAGNOSIS — R208 Other disturbances of skin sensation: Secondary | ICD-10-CM

## 2017-02-21 MED ORDER — GADOBENATE DIMEGLUMINE 529 MG/ML IV SOLN
19.0000 mL | Freq: Once | INTRAVENOUS | Status: AC | PRN
  Administered 2017-02-21: 19 mL via INTRAVENOUS

## 2017-02-24 ENCOUNTER — Telehealth: Payer: Self-pay | Admitting: Neurology

## 2017-02-24 DIAGNOSIS — R39198 Other difficulties with micturition: Secondary | ICD-10-CM | POA: Insufficient documentation

## 2017-02-24 DIAGNOSIS — M5124 Other intervertebral disc displacement, thoracic region: Secondary | ICD-10-CM | POA: Insufficient documentation

## 2017-02-24 NOTE — Telephone Encounter (Signed)
I spoke to Mary Strong about the results of the thoracic and lumbar MRI. In the thoracic MRI, she has a disc herniation at T7-T8 that indents the thecal sac and slightly flattens the anterior aspect of the spinal cord. There are similar but milder changes at T9-T10 and T11-T12.   In the lumbar spine she has spinal stenosis and anterolisthesis of L3 upon L4 causing mild spinal stenosis and milder degenerative changes at L4-L5.

## 2017-02-24 NOTE — Telephone Encounter (Signed)
MRI report is complex.  RAS will call pt. this afternoon/fim

## 2017-02-24 NOTE — Telephone Encounter (Signed)
Pt called request MRI results °

## 2017-03-10 DIAGNOSIS — Z6836 Body mass index (BMI) 36.0-36.9, adult: Secondary | ICD-10-CM | POA: Diagnosis not present

## 2017-03-10 DIAGNOSIS — M25511 Pain in right shoulder: Secondary | ICD-10-CM | POA: Diagnosis not present

## 2017-03-10 DIAGNOSIS — M545 Low back pain: Secondary | ICD-10-CM | POA: Diagnosis not present

## 2017-03-10 DIAGNOSIS — G8929 Other chronic pain: Secondary | ICD-10-CM | POA: Diagnosis not present

## 2017-05-18 DIAGNOSIS — K219 Gastro-esophageal reflux disease without esophagitis: Secondary | ICD-10-CM | POA: Diagnosis not present

## 2017-05-18 DIAGNOSIS — E1165 Type 2 diabetes mellitus with hyperglycemia: Secondary | ICD-10-CM | POA: Diagnosis not present

## 2017-05-18 DIAGNOSIS — E8881 Metabolic syndrome: Secondary | ICD-10-CM | POA: Diagnosis not present

## 2017-05-18 DIAGNOSIS — Z9189 Other specified personal risk factors, not elsewhere classified: Secondary | ICD-10-CM | POA: Diagnosis not present

## 2017-05-18 DIAGNOSIS — D649 Anemia, unspecified: Secondary | ICD-10-CM | POA: Diagnosis not present

## 2017-05-18 DIAGNOSIS — F331 Major depressive disorder, recurrent, moderate: Secondary | ICD-10-CM | POA: Diagnosis not present

## 2017-05-18 DIAGNOSIS — E039 Hypothyroidism, unspecified: Secondary | ICD-10-CM | POA: Diagnosis not present

## 2017-05-18 DIAGNOSIS — E782 Mixed hyperlipidemia: Secondary | ICD-10-CM | POA: Diagnosis not present

## 2017-05-20 DIAGNOSIS — G35 Multiple sclerosis: Secondary | ICD-10-CM | POA: Diagnosis not present

## 2017-05-20 DIAGNOSIS — Z1389 Encounter for screening for other disorder: Secondary | ICD-10-CM | POA: Diagnosis not present

## 2017-05-20 DIAGNOSIS — E6609 Other obesity due to excess calories: Secondary | ICD-10-CM | POA: Diagnosis not present

## 2017-05-20 DIAGNOSIS — E119 Type 2 diabetes mellitus without complications: Secondary | ICD-10-CM | POA: Diagnosis not present

## 2017-05-20 DIAGNOSIS — E8881 Metabolic syndrome: Secondary | ICD-10-CM | POA: Diagnosis not present

## 2017-05-20 DIAGNOSIS — Z9189 Other specified personal risk factors, not elsewhere classified: Secondary | ICD-10-CM | POA: Diagnosis not present

## 2017-05-20 DIAGNOSIS — K7581 Nonalcoholic steatohepatitis (NASH): Secondary | ICD-10-CM | POA: Diagnosis not present

## 2017-05-20 DIAGNOSIS — K219 Gastro-esophageal reflux disease without esophagitis: Secondary | ICD-10-CM | POA: Diagnosis not present

## 2017-05-20 DIAGNOSIS — E782 Mixed hyperlipidemia: Secondary | ICD-10-CM | POA: Diagnosis not present

## 2017-05-20 DIAGNOSIS — E039 Hypothyroidism, unspecified: Secondary | ICD-10-CM | POA: Diagnosis not present

## 2017-05-20 DIAGNOSIS — Z6836 Body mass index (BMI) 36.0-36.9, adult: Secondary | ICD-10-CM | POA: Diagnosis not present

## 2017-05-20 DIAGNOSIS — F331 Major depressive disorder, recurrent, moderate: Secondary | ICD-10-CM | POA: Diagnosis not present

## 2017-05-20 DIAGNOSIS — Z23 Encounter for immunization: Secondary | ICD-10-CM | POA: Diagnosis not present

## 2017-05-20 DIAGNOSIS — M545 Low back pain: Secondary | ICD-10-CM | POA: Diagnosis not present

## 2017-06-02 DIAGNOSIS — H16223 Keratoconjunctivitis sicca, not specified as Sjogren's, bilateral: Secondary | ICD-10-CM | POA: Diagnosis not present

## 2017-06-02 DIAGNOSIS — E113293 Type 2 diabetes mellitus with mild nonproliferative diabetic retinopathy without macular edema, bilateral: Secondary | ICD-10-CM | POA: Diagnosis not present

## 2017-06-17 ENCOUNTER — Other Ambulatory Visit: Payer: Self-pay | Admitting: Neurology

## 2017-08-05 ENCOUNTER — Ambulatory Visit: Payer: Medicare Other | Admitting: Neurology

## 2017-08-18 ENCOUNTER — Other Ambulatory Visit: Payer: Self-pay | Admitting: Neurology

## 2017-09-07 ENCOUNTER — Ambulatory Visit (INDEPENDENT_AMBULATORY_CARE_PROVIDER_SITE_OTHER): Payer: Medicare Other | Admitting: Neurology

## 2017-09-07 ENCOUNTER — Other Ambulatory Visit: Payer: Self-pay

## 2017-09-07 ENCOUNTER — Encounter: Payer: Self-pay | Admitting: Neurology

## 2017-09-07 VITALS — BP 108/70 | HR 78 | Resp 18 | Ht 62.0 in | Wt 203.5 lb

## 2017-09-07 DIAGNOSIS — F518 Other sleep disorders not due to a substance or known physiological condition: Secondary | ICD-10-CM | POA: Diagnosis not present

## 2017-09-07 DIAGNOSIS — R3911 Hesitancy of micturition: Secondary | ICD-10-CM

## 2017-09-07 DIAGNOSIS — R5382 Chronic fatigue, unspecified: Secondary | ICD-10-CM

## 2017-09-07 DIAGNOSIS — G35 Multiple sclerosis: Secondary | ICD-10-CM | POA: Diagnosis not present

## 2017-09-07 DIAGNOSIS — R269 Unspecified abnormalities of gait and mobility: Secondary | ICD-10-CM

## 2017-09-07 DIAGNOSIS — R208 Other disturbances of skin sensation: Secondary | ICD-10-CM
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
## 2017-11-09 DIAGNOSIS — D649 Anemia, unspecified: Secondary | ICD-10-CM | POA: Diagnosis not present

## 2017-11-09 DIAGNOSIS — G35 Multiple sclerosis: Secondary | ICD-10-CM | POA: Diagnosis not present

## 2017-11-09 DIAGNOSIS — K219 Gastro-esophageal reflux disease without esophagitis: Secondary | ICD-10-CM | POA: Diagnosis not present

## 2017-11-09 DIAGNOSIS — E8881 Metabolic syndrome: Secondary | ICD-10-CM | POA: Diagnosis not present

## 2017-11-09 DIAGNOSIS — M545 Low back pain: Secondary | ICD-10-CM | POA: Diagnosis not present

## 2017-11-09 DIAGNOSIS — E1165 Type 2 diabetes mellitus with hyperglycemia: Secondary | ICD-10-CM | POA: Diagnosis not present

## 2017-11-09 DIAGNOSIS — Z9189 Other specified personal risk factors, not elsewhere classified: Secondary | ICD-10-CM | POA: Diagnosis not present

## 2017-11-09 DIAGNOSIS — E039 Hypothyroidism, unspecified: Secondary | ICD-10-CM | POA: Diagnosis not present

## 2017-11-09 DIAGNOSIS — E782 Mixed hyperlipidemia: Secondary | ICD-10-CM | POA: Diagnosis not present

## 2017-11-15 DIAGNOSIS — E782 Mixed hyperlipidemia: Secondary | ICD-10-CM | POA: Diagnosis not present

## 2017-11-15 DIAGNOSIS — M545 Low back pain: Secondary | ICD-10-CM | POA: Diagnosis not present

## 2017-11-15 DIAGNOSIS — E039 Hypothyroidism, unspecified: Secondary | ICD-10-CM | POA: Diagnosis not present

## 2017-11-15 DIAGNOSIS — Z6834 Body mass index (BMI) 34.0-34.9, adult: Secondary | ICD-10-CM | POA: Diagnosis not present

## 2017-11-15 DIAGNOSIS — E119 Type 2 diabetes mellitus without complications: Secondary | ICD-10-CM | POA: Diagnosis not present

## 2017-11-15 DIAGNOSIS — E8881 Metabolic syndrome: Secondary | ICD-10-CM | POA: Diagnosis not present

## 2017-11-15 DIAGNOSIS — G35 Multiple sclerosis: Secondary | ICD-10-CM | POA: Diagnosis not present

## 2017-11-15 DIAGNOSIS — K7581 Nonalcoholic steatohepatitis (NASH): Secondary | ICD-10-CM | POA: Diagnosis not present

## 2017-12-16 DIAGNOSIS — E039 Hypothyroidism, unspecified: Secondary | ICD-10-CM | POA: Diagnosis not present

## 2017-12-16 DIAGNOSIS — R51 Headache: Secondary | ICD-10-CM | POA: Diagnosis not present

## 2017-12-16 DIAGNOSIS — K5900 Constipation, unspecified: Secondary | ICD-10-CM | POA: Diagnosis not present

## 2017-12-16 DIAGNOSIS — R5383 Other fatigue: Secondary | ICD-10-CM | POA: Diagnosis not present

## 2017-12-16 DIAGNOSIS — R002 Palpitations: Secondary | ICD-10-CM | POA: Diagnosis not present

## 2017-12-16 DIAGNOSIS — R32 Unspecified urinary incontinence: Secondary | ICD-10-CM | POA: Diagnosis not present

## 2017-12-16 DIAGNOSIS — E1165 Type 2 diabetes mellitus with hyperglycemia: Secondary | ICD-10-CM | POA: Diagnosis not present

## 2017-12-16 DIAGNOSIS — Z6834 Body mass index (BMI) 34.0-34.9, adult: Secondary | ICD-10-CM | POA: Diagnosis not present

## 2017-12-19 ENCOUNTER — Other Ambulatory Visit: Payer: Self-pay | Admitting: Neurology

## 2018-02-14 DIAGNOSIS — R5383 Other fatigue: Secondary | ICD-10-CM | POA: Diagnosis not present

## 2018-02-14 DIAGNOSIS — D649 Anemia, unspecified: Secondary | ICD-10-CM | POA: Diagnosis not present

## 2018-02-14 DIAGNOSIS — E782 Mixed hyperlipidemia: Secondary | ICD-10-CM | POA: Diagnosis not present

## 2018-02-14 DIAGNOSIS — K219 Gastro-esophageal reflux disease without esophagitis: Secondary | ICD-10-CM | POA: Diagnosis not present

## 2018-02-14 DIAGNOSIS — E8881 Metabolic syndrome: Secondary | ICD-10-CM | POA: Diagnosis not present

## 2018-02-14 DIAGNOSIS — E1165 Type 2 diabetes mellitus with hyperglycemia: Secondary | ICD-10-CM | POA: Diagnosis not present

## 2018-02-14 DIAGNOSIS — E039 Hypothyroidism, unspecified: Secondary | ICD-10-CM | POA: Diagnosis not present

## 2018-02-14 DIAGNOSIS — Z9189 Other specified personal risk factors, not elsewhere classified: Secondary | ICD-10-CM | POA: Diagnosis not present

## 2018-02-21 DIAGNOSIS — E039 Hypothyroidism, unspecified: Secondary | ICD-10-CM | POA: Diagnosis not present

## 2018-02-21 DIAGNOSIS — E1142 Type 2 diabetes mellitus with diabetic polyneuropathy: Secondary | ICD-10-CM | POA: Diagnosis not present

## 2018-02-21 DIAGNOSIS — Z6835 Body mass index (BMI) 35.0-35.9, adult: Secondary | ICD-10-CM | POA: Diagnosis not present

## 2018-02-21 DIAGNOSIS — K7581 Nonalcoholic steatohepatitis (NASH): Secondary | ICD-10-CM | POA: Diagnosis not present

## 2018-02-21 DIAGNOSIS — G35 Multiple sclerosis: Secondary | ICD-10-CM | POA: Diagnosis not present

## 2018-02-21 DIAGNOSIS — E782 Mixed hyperlipidemia: Secondary | ICD-10-CM | POA: Diagnosis not present

## 2018-02-21 DIAGNOSIS — G6289 Other specified polyneuropathies: Secondary | ICD-10-CM | POA: Diagnosis not present

## 2018-03-08 ENCOUNTER — Ambulatory Visit: Payer: Medicare Other | Admitting: Neurology

## 2018-03-15 ENCOUNTER — Ambulatory Visit: Payer: Medicare Other | Admitting: Neurology

## 2018-03-21 ENCOUNTER — Other Ambulatory Visit: Payer: Self-pay | Admitting: Neurology

## 2018-03-21 NOTE — Telephone Encounter (Signed)
Faxed printed/signed rx diazepam to [facility] at [phone]. Received fax confirmation.

## 2018-03-24 ENCOUNTER — Telehealth: Payer: Self-pay | Admitting: Neurology

## 2018-03-24 DIAGNOSIS — G35 Multiple sclerosis: Secondary | ICD-10-CM

## 2018-03-24 DIAGNOSIS — R269 Unspecified abnormalities of gait and mobility: Secondary | ICD-10-CM

## 2018-03-24 NOTE — Telephone Encounter (Signed)
Pt called stating for the last 4 months she has had a very low grade h/a. Stating within the last 2 weeks the h/a has worsened. Pts head hs started " pounding and squinting her eyes" requesting a call discussing what to do. Also if Dr. Felecia Shelling would like her to get an MRI.

## 2018-03-24 NOTE — Telephone Encounter (Signed)
Medicare order sent to GI. No auth they will reach out to the pt to schedule.  °

## 2018-03-24 NOTE — Telephone Encounter (Signed)
I called patient. She started insulin (Novolin) about 6-7 months ago. Headaches started about 4-5 months ago.  She saw PCP for regular check up. They mentioned possibility of migraines. She wakes up in the morning or during the night with them sometimes. For the last two weeks, she has them almost every day and has "pounding" sensation. She has tried tylenol (ineffetive), cannot take aleve (makes stomach upset). When her head starts to pound she also has left sided facial pain. Headache pain on top of her head.  Takes ibuprofen 600mg  qhs to help with achy legs, and pain in-between her shoulder blades She also uses pillow and heating pad for this. She takes valium for foot cramps.  Novolin- she has lost 10lb since starting the insulin.     Wondering if she can have MRI brain ordered. She is due for one. She had to r/s her appt 03/08/18 and 03/15/18 (d/t provider being out). Her appt is not until 06/2018.   Advised I will speak with Dr. Felecia Shelling and call her back. She verbalized understanding.

## 2018-03-24 NOTE — Telephone Encounter (Signed)
Called pt. Scheduled f/u for 03/30/18 at 11:30am. She is aware we are going to order MRI brain for her to get scheduled.

## 2018-03-24 NOTE — Telephone Encounter (Signed)
I spoke with Dr. Felecia Shelling- he wants to order MRI brain w/ wo contrast and schedule her for earlier f/u to come in to be evaluated.

## 2018-03-30 ENCOUNTER — Ambulatory Visit (INDEPENDENT_AMBULATORY_CARE_PROVIDER_SITE_OTHER): Payer: Medicare Other | Admitting: Neurology

## 2018-03-30 ENCOUNTER — Encounter: Payer: Self-pay | Admitting: Neurology

## 2018-03-30 VITALS — BP 110/69 | HR 86 | Ht 62.0 in | Wt 194.5 lb

## 2018-03-30 DIAGNOSIS — R208 Other disturbances of skin sensation: Secondary | ICD-10-CM

## 2018-03-30 DIAGNOSIS — R159 Full incontinence of feces: Secondary | ICD-10-CM | POA: Diagnosis not present

## 2018-03-30 DIAGNOSIS — G35 Multiple sclerosis: Secondary | ICD-10-CM

## 2018-03-30 DIAGNOSIS — R39198 Other difficulties with micturition: Secondary | ICD-10-CM

## 2018-03-30 DIAGNOSIS — R269 Unspecified abnormalities of gait and mobility: Secondary | ICD-10-CM

## 2018-03-30 DIAGNOSIS — G801 Spastic diplegic cerebral palsy: Secondary | ICD-10-CM | POA: Diagnosis not present

## 2018-03-30 DIAGNOSIS — M5124 Other intervertebral disc displacement, thoracic region: Secondary | ICD-10-CM | POA: Diagnosis not present

## 2018-03-30 MED ORDER — CYCLOBENZAPRINE HCL 5 MG PO TABS: 5.0000 mg | ORAL_TABLET | Freq: Three times a day (TID) | ORAL | 5 refills | Status: DC | PRN
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
## 2018-04-04 ENCOUNTER — Telehealth: Payer: Self-pay | Admitting: Neurology

## 2018-04-04 NOTE — Telephone Encounter (Signed)
Medicare order sent to GI. No auth they will reach out to the pt to schedule.  °

## 2018-04-06 ENCOUNTER — Telehealth: Payer: Self-pay | Admitting: Neurology

## 2018-04-06 NOTE — Telephone Encounter (Signed)
Dr. Felecia Shelling- FYI  I called pt back. She did not discuss this with Dr. Felecia Shelling at recent visit on 03/30/18. This is a new sx that started in the last few weeks. She has her imaging scheduled 04/09/18 and 04/16/18. Recommended she still complete this to r/o other causes of her sx.  She feels flexeril has helped some as well. Advised Dr. Felecia Shelling out of the office but I will send him a message to make him aware. She verbalized understanding and appreciation.

## 2018-04-06 NOTE — Telephone Encounter (Signed)
Pt called stating she is has been experiencing "pulsatile tinnitus" keeping her awake at night for the past 2/3 weeks. Requesting a call to discuss further.

## 2018-04-09 ENCOUNTER — Ambulatory Visit
Admission: RE | Admit: 2018-04-09 | Discharge: 2018-04-09 | Disposition: A | Discharge status: Home / Self Care | Payer: Medicare Other | Source: Ambulatory Visit | Attending: Neurology | Admitting: Neurology

## 2018-04-09 DIAGNOSIS — R269 Unspecified abnormalities of gait and mobility: Secondary | ICD-10-CM

## 2018-04-09 DIAGNOSIS — G35 Multiple sclerosis: Secondary | ICD-10-CM | POA: Diagnosis not present

## 2018-04-09 MED ORDER — GADOBENATE DIMEGLUMINE 529 MG/ML IV SOLN
18.0000 mL | Freq: Once | INTRAVENOUS | Status: AC | PRN
  Administered 2018-04-09: 18 mL via INTRAVENOUS

## 2018-04-11 ENCOUNTER — Telehealth: Payer: Self-pay | Admitting: *Deleted

## 2018-04-11 NOTE — Telephone Encounter (Signed)
I have spoken with Mary Strong today and reviewed below MRI results. She verbalized understanding of same/fim

## 2018-04-11 NOTE — Telephone Encounter (Signed)
-----   Message from Hope Pigeon, RN sent at 04/11/2018  8:34 AM EST -----   ----- Message ----- From: Britt Bottom, MD Sent: 04/11/2018   8:05 AM EST To: Hope Pigeon, RN  Please let the patient know that the MRI was compared side-by-side to her 2018 MRI.  There are  no new lesions.Marland Kitchen

## 2018-04-12 DIAGNOSIS — E113293 Type 2 diabetes mellitus with mild nonproliferative diabetic retinopathy without macular edema, bilateral: Secondary | ICD-10-CM | POA: Diagnosis not present

## 2018-04-16 ENCOUNTER — Ambulatory Visit
Admission: RE | Admit: 2018-04-16 | Discharge: 2018-04-16 | Disposition: A | Discharge status: Home / Self Care | Payer: Medicare Other | Source: Ambulatory Visit | Attending: Neurology | Admitting: Neurology

## 2018-04-16 DIAGNOSIS — G35 Multiple sclerosis: Secondary | ICD-10-CM

## 2018-04-18 ENCOUNTER — Telehealth: Payer: Self-pay | Admitting: *Deleted

## 2018-04-18 NOTE — Telephone Encounter (Signed)
-----   Message from Britt Bottom, MD sent at 04/17/2018  7:17 PM EST ----- Please let her know that the MRI of the cervical spine shows the plaque adjacent to C7 that have been seen previously.  The MRI of the thoracic spine shows a couple very small foci that were not seen on all of the images.  They could represent plaque will be artifact.  I am going to try to get the MRI from [facility] in [place]  Please see if you can get the MRI from [facility].

## 2018-04-18 NOTE — Telephone Encounter (Signed)
Spoke with Mary Strong and reviewed below MRI results.  Will see if Mary Strong can get the MRI from [facility]

## 2018-04-19 NOTE — Telephone Encounter (Signed)
I spoke with MRI at [facility] in [place]. Pt. had MRI's of lumbar and thoracic spines done there in 2014.  Dr. Felecia Shelling would like CD's of those MRI's to compare with recent MRI's done here. They will fax reports to [phone] and mail CD's to our office address to Dr. Garth Bigness attn/fim

## 2018-04-22 NOTE — Telephone Encounter (Signed)
Pt requesting a call, stating she would like an update on her MRI

## 2018-04-22 NOTE — Telephone Encounter (Signed)
Spoke with Mary Strong and explained we have requested her MRI from [facility] but they have to mail the CD; we do not have it yet.  Once we receive it and Dr. Felecia Shelling compares it with her most recent MRI, will call her back/fim

## 2018-05-02 DIAGNOSIS — G35 Multiple sclerosis: Secondary | ICD-10-CM | POA: Diagnosis not present

## 2018-05-02 DIAGNOSIS — E039 Hypothyroidism, unspecified: Secondary | ICD-10-CM | POA: Diagnosis not present

## 2018-05-02 DIAGNOSIS — F331 Major depressive disorder, recurrent, moderate: Secondary | ICD-10-CM | POA: Diagnosis not present

## 2018-05-02 DIAGNOSIS — E782 Mixed hyperlipidemia: Secondary | ICD-10-CM | POA: Diagnosis not present

## 2018-05-06 ENCOUNTER — Telehealth: Payer: Self-pay | Admitting: Neurology

## 2018-05-06 NOTE — Telephone Encounter (Signed)
Dr. Jannifer Franklin- pt requesting MRI results. This is a Dr. Felecia Shelling pt. He is out of the office and you are listed at the [facility] today. Thank you

## 2018-05-06 NOTE — Telephone Encounter (Signed)
Pt would like a call back with the results of her MRI.

## 2018-05-06 NOTE — Telephone Encounter (Signed)
I called and talk with the patient.  The patient apparently has already gone over the MRI studies with Dr. Felecia Shelling.  Apparently Dr. Felecia Shelling was to get MRI evaluations from another center and compared to this most recent evaluation, I am not sure if he was able to obtain these records or not.  I will have Dr. Felecia Shelling call the patient when he returns.

## 2018-05-10 NOTE — Telephone Encounter (Signed)
Please let the patient know that the  MRIs were compared to scans from 2018 and there were no changes

## 2018-05-10 NOTE — Telephone Encounter (Signed)
Called pt and relayed results. She verbalized understanding. She inquired about upcoming f/u in February. Advised I cx that since she came in sooner on 03/30/2018. Appears she has no f/u pending. Dr. Felecia Shelling wanted to see her back in 4-5 months. I scheduled appt for 08/02/2018 at 4pm. Pt verbalized understanding and appreciation for call.

## 2018-06-02 DIAGNOSIS — K219 Gastro-esophageal reflux disease without esophagitis: Secondary | ICD-10-CM | POA: Diagnosis not present

## 2018-06-02 DIAGNOSIS — E782 Mixed hyperlipidemia: Secondary | ICD-10-CM | POA: Diagnosis not present

## 2018-06-14 ENCOUNTER — Ambulatory Visit: Payer: Medicare Other | Admitting: Neurology

## 2018-06-14 DIAGNOSIS — E7800 Pure hypercholesterolemia, unspecified: Secondary | ICD-10-CM | POA: Diagnosis not present

## 2018-06-14 DIAGNOSIS — E1165 Type 2 diabetes mellitus with hyperglycemia: Secondary | ICD-10-CM | POA: Diagnosis not present

## 2018-06-14 DIAGNOSIS — K219 Gastro-esophageal reflux disease without esophagitis: Secondary | ICD-10-CM | POA: Diagnosis not present

## 2018-06-14 DIAGNOSIS — E782 Mixed hyperlipidemia: Secondary | ICD-10-CM | POA: Diagnosis not present

## 2018-06-14 DIAGNOSIS — K7581 Nonalcoholic steatohepatitis (NASH): Secondary | ICD-10-CM | POA: Diagnosis not present

## 2018-06-14 DIAGNOSIS — E1142 Type 2 diabetes mellitus with diabetic polyneuropathy: Secondary | ICD-10-CM | POA: Diagnosis not present

## 2018-06-14 DIAGNOSIS — I10 Essential (primary) hypertension: Secondary | ICD-10-CM | POA: Diagnosis not present

## 2018-06-14 DIAGNOSIS — E039 Hypothyroidism, unspecified: Secondary | ICD-10-CM | POA: Diagnosis not present

## 2018-06-15 DIAGNOSIS — J1089 Influenza due to other identified influenza virus with other manifestations: Secondary | ICD-10-CM | POA: Diagnosis not present

## 2018-06-16 DIAGNOSIS — S199XXA Unspecified injury of neck, initial encounter: Secondary | ICD-10-CM | POA: Diagnosis not present

## 2018-06-16 DIAGNOSIS — Z880 Allergy status to penicillin: Secondary | ICD-10-CM | POA: Diagnosis not present

## 2018-06-16 DIAGNOSIS — E860 Dehydration: Secondary | ICD-10-CM | POA: Diagnosis not present

## 2018-06-16 DIAGNOSIS — R55 Syncope and collapse: Secondary | ICD-10-CM | POA: Diagnosis not present

## 2018-06-16 DIAGNOSIS — Z881 Allergy status to other antibiotic agents status: Secondary | ICD-10-CM | POA: Diagnosis not present

## 2018-06-16 DIAGNOSIS — S0990XA Unspecified injury of head, initial encounter: Secondary | ICD-10-CM | POA: Diagnosis not present

## 2018-06-16 DIAGNOSIS — S299XXA Unspecified injury of thorax, initial encounter: Secondary | ICD-10-CM | POA: Diagnosis not present

## 2018-06-16 DIAGNOSIS — R079 Chest pain, unspecified: Secondary | ICD-10-CM | POA: Diagnosis not present

## 2018-06-21 DIAGNOSIS — K7581 Nonalcoholic steatohepatitis (NASH): Secondary | ICD-10-CM | POA: Diagnosis not present

## 2018-06-21 DIAGNOSIS — K219 Gastro-esophageal reflux disease without esophagitis: Secondary | ICD-10-CM | POA: Diagnosis not present

## 2018-06-21 DIAGNOSIS — Z6834 Body mass index (BMI) 34.0-34.9, adult: Secondary | ICD-10-CM | POA: Diagnosis not present

## 2018-06-21 DIAGNOSIS — E1142 Type 2 diabetes mellitus with diabetic polyneuropathy: Secondary | ICD-10-CM | POA: Diagnosis not present

## 2018-06-21 DIAGNOSIS — F331 Major depressive disorder, recurrent, moderate: Secondary | ICD-10-CM | POA: Diagnosis not present

## 2018-06-21 DIAGNOSIS — E039 Hypothyroidism, unspecified: Secondary | ICD-10-CM | POA: Diagnosis not present

## 2018-06-21 DIAGNOSIS — Z0001 Encounter for general adult medical examination with abnormal findings: Secondary | ICD-10-CM | POA: Diagnosis not present

## 2018-06-21 DIAGNOSIS — E782 Mixed hyperlipidemia: Secondary | ICD-10-CM | POA: Diagnosis not present

## 2018-06-21 DIAGNOSIS — G35 Multiple sclerosis: Secondary | ICD-10-CM | POA: Diagnosis not present

## 2018-06-21 DIAGNOSIS — G6289 Other specified polyneuropathies: Secondary | ICD-10-CM | POA: Diagnosis not present

## 2018-06-26 ENCOUNTER — Other Ambulatory Visit: Payer: Self-pay | Admitting: Neurology

## 2018-07-01 DIAGNOSIS — E119 Type 2 diabetes mellitus without complications: Secondary | ICD-10-CM | POA: Diagnosis not present

## 2018-07-01 DIAGNOSIS — E7800 Pure hypercholesterolemia, unspecified: Secondary | ICD-10-CM | POA: Diagnosis not present

## 2018-08-01 DIAGNOSIS — K219 Gastro-esophageal reflux disease without esophagitis: Secondary | ICD-10-CM | POA: Diagnosis not present

## 2018-08-01 DIAGNOSIS — E1142 Type 2 diabetes mellitus with diabetic polyneuropathy: Secondary | ICD-10-CM | POA: Diagnosis not present

## 2018-08-01 DIAGNOSIS — F331 Major depressive disorder, recurrent, moderate: Secondary | ICD-10-CM | POA: Diagnosis not present

## 2018-08-01 DIAGNOSIS — I10 Essential (primary) hypertension: Secondary | ICD-10-CM | POA: Diagnosis not present

## 2018-08-02 ENCOUNTER — Ambulatory Visit: Payer: Self-pay | Admitting: Neurology

## 2018-09-12 ENCOUNTER — Telehealth: Payer: Self-pay | Admitting: Neurology

## 2018-09-12 NOTE — Addendum Note (Signed)
Addended by: Rossie Muskrat L on: 09/12/2018 11:18 AM   Modules accepted: Orders

## 2018-09-12 NOTE — Telephone Encounter (Signed)
Patient calling requesting a follow up appointment with Dr Felecia Shelling. Please call her at [phone]

## 2018-09-12 NOTE — Telephone Encounter (Signed)
Called pt. Scheduled doxy[employer]me virtual visit with Dr. Felecia Shelling tomorrow 09/13/18 at 10:30am. Emailed pt link at [email or web address]. Asked her to call back if she does not receive. Updated med list/pharmacy/allergies on file.

## 2018-09-12 NOTE — Telephone Encounter (Signed)
Spoke with Hilda Blades--- no collection balance w/ [facility]. Ok to schedule appt

## 2018-09-13 ENCOUNTER — Other Ambulatory Visit: Payer: Self-pay

## 2018-09-13 ENCOUNTER — Ambulatory Visit (INDEPENDENT_AMBULATORY_CARE_PROVIDER_SITE_OTHER): Payer: Medicare Other | Admitting: Neurology

## 2018-09-13 ENCOUNTER — Encounter: Payer: Self-pay | Admitting: Neurology

## 2018-09-13 DIAGNOSIS — F329 Major depressive disorder, single episode, unspecified: Secondary | ICD-10-CM | POA: Diagnosis not present

## 2018-09-13 DIAGNOSIS — G35 Multiple sclerosis: Secondary | ICD-10-CM | POA: Diagnosis not present

## 2018-09-13 DIAGNOSIS — R39198 Other difficulties with micturition: Secondary | ICD-10-CM

## 2018-09-13 DIAGNOSIS — R208 Other disturbances of skin sensation: Secondary | ICD-10-CM | POA: Diagnosis not present

## 2018-09-13 DIAGNOSIS — E559 Vitamin D deficiency, unspecified: Secondary | ICD-10-CM | POA: Diagnosis not present

## 2018-09-13 DIAGNOSIS — F32A Depression, unspecified: Secondary | ICD-10-CM

## 2018-09-13 MED ORDER — DIAZEPAM 5 MG PO TABS: ORAL_TABLET | ORAL | 5 refills | Status: DC
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
## 2018-10-01 DIAGNOSIS — E7800 Pure hypercholesterolemia, unspecified: Secondary | ICD-10-CM | POA: Diagnosis not present

## 2018-10-01 DIAGNOSIS — E782 Mixed hyperlipidemia: Secondary | ICD-10-CM | POA: Diagnosis not present

## 2018-10-11 DIAGNOSIS — E119 Type 2 diabetes mellitus without complications: Secondary | ICD-10-CM | POA: Diagnosis not present

## 2018-10-11 DIAGNOSIS — E8881 Metabolic syndrome: Secondary | ICD-10-CM | POA: Diagnosis not present

## 2018-10-11 DIAGNOSIS — E782 Mixed hyperlipidemia: Secondary | ICD-10-CM | POA: Diagnosis not present

## 2018-10-11 DIAGNOSIS — R5383 Other fatigue: Secondary | ICD-10-CM | POA: Diagnosis not present

## 2018-10-14 DIAGNOSIS — E1142 Type 2 diabetes mellitus with diabetic polyneuropathy: Secondary | ICD-10-CM | POA: Diagnosis not present

## 2018-10-14 DIAGNOSIS — K7581 Nonalcoholic steatohepatitis (NASH): Secondary | ICD-10-CM | POA: Diagnosis not present

## 2018-10-14 DIAGNOSIS — E782 Mixed hyperlipidemia: Secondary | ICD-10-CM | POA: Diagnosis not present

## 2018-10-14 DIAGNOSIS — F331 Major depressive disorder, recurrent, moderate: Secondary | ICD-10-CM | POA: Diagnosis not present

## 2018-10-14 DIAGNOSIS — E039 Hypothyroidism, unspecified: Secondary | ICD-10-CM | POA: Diagnosis not present

## 2018-10-14 DIAGNOSIS — G35 Multiple sclerosis: Secondary | ICD-10-CM | POA: Diagnosis not present

## 2018-10-14 DIAGNOSIS — K219 Gastro-esophageal reflux disease without esophagitis: Secondary | ICD-10-CM | POA: Diagnosis not present

## 2018-10-14 DIAGNOSIS — Z6835 Body mass index (BMI) 35.0-35.9, adult: Secondary | ICD-10-CM | POA: Diagnosis not present

## 2018-11-01 DIAGNOSIS — E782 Mixed hyperlipidemia: Secondary | ICD-10-CM | POA: Diagnosis not present

## 2018-11-01 DIAGNOSIS — I10 Essential (primary) hypertension: Secondary | ICD-10-CM | POA: Diagnosis not present

## 2018-11-01 DIAGNOSIS — E1165 Type 2 diabetes mellitus with hyperglycemia: Secondary | ICD-10-CM | POA: Diagnosis not present

## 2018-12-12 DIAGNOSIS — K5900 Constipation, unspecified: Secondary | ICD-10-CM | POA: Diagnosis not present

## 2018-12-12 DIAGNOSIS — Z6835 Body mass index (BMI) 35.0-35.9, adult: Secondary | ICD-10-CM | POA: Diagnosis not present

## 2018-12-20 ENCOUNTER — Encounter (INDEPENDENT_AMBULATORY_CARE_PROVIDER_SITE_OTHER): Payer: Self-pay | Admitting: *Deleted

## 2018-12-20 DIAGNOSIS — R531 Weakness: Secondary | ICD-10-CM | POA: Diagnosis not present

## 2018-12-20 DIAGNOSIS — Z91013 Allergy to seafood: Secondary | ICD-10-CM | POA: Diagnosis not present

## 2018-12-20 DIAGNOSIS — F329 Major depressive disorder, single episode, unspecified: Secondary | ICD-10-CM | POA: Diagnosis not present

## 2018-12-20 DIAGNOSIS — G35 Multiple sclerosis: Secondary | ICD-10-CM | POA: Diagnosis not present

## 2018-12-20 DIAGNOSIS — Z79899 Other long term (current) drug therapy: Secondary | ICD-10-CM | POA: Diagnosis not present

## 2018-12-20 DIAGNOSIS — E039 Hypothyroidism, unspecified: Secondary | ICD-10-CM | POA: Diagnosis not present

## 2018-12-20 DIAGNOSIS — I4510 Unspecified right bundle-branch block: Secondary | ICD-10-CM | POA: Diagnosis not present

## 2018-12-20 DIAGNOSIS — M810 Age-related osteoporosis without current pathological fracture: Secondary | ICD-10-CM | POA: Diagnosis not present

## 2018-12-20 DIAGNOSIS — R079 Chest pain, unspecified: Secondary | ICD-10-CM | POA: Diagnosis not present

## 2018-12-20 DIAGNOSIS — E785 Hyperlipidemia, unspecified: Secondary | ICD-10-CM | POA: Diagnosis not present

## 2018-12-20 DIAGNOSIS — Z880 Allergy status to penicillin: Secondary | ICD-10-CM | POA: Diagnosis not present

## 2018-12-20 DIAGNOSIS — Z881 Allergy status to other antibiotic agents status: Secondary | ICD-10-CM | POA: Diagnosis not present

## 2018-12-20 DIAGNOSIS — L409 Psoriasis, unspecified: Secondary | ICD-10-CM | POA: Diagnosis not present

## 2018-12-20 DIAGNOSIS — R0602 Shortness of breath: Secondary | ICD-10-CM | POA: Diagnosis not present

## 2018-12-20 DIAGNOSIS — K219 Gastro-esophageal reflux disease without esophagitis: Secondary | ICD-10-CM | POA: Diagnosis not present

## 2018-12-20 DIAGNOSIS — Z9049 Acquired absence of other specified parts of digestive tract: Secondary | ICD-10-CM | POA: Diagnosis not present

## 2018-12-20 DIAGNOSIS — Z96643 Presence of artificial hip joint, bilateral: Secondary | ICD-10-CM | POA: Diagnosis not present

## 2018-12-20 DIAGNOSIS — I10 Essential (primary) hypertension: Secondary | ICD-10-CM | POA: Diagnosis not present

## 2018-12-20 DIAGNOSIS — E119 Type 2 diabetes mellitus without complications: Secondary | ICD-10-CM | POA: Diagnosis not present

## 2018-12-20 DIAGNOSIS — I200 Unstable angina: Secondary | ICD-10-CM | POA: Diagnosis not present

## 2018-12-20 DIAGNOSIS — R0789 Other chest pain: Secondary | ICD-10-CM | POA: Diagnosis not present

## 2018-12-21 ENCOUNTER — Encounter: Payer: Self-pay | Admitting: Cardiology

## 2018-12-21 DIAGNOSIS — I10 Essential (primary) hypertension: Secondary | ICD-10-CM | POA: Diagnosis not present

## 2018-12-21 DIAGNOSIS — R079 Chest pain, unspecified: Secondary | ICD-10-CM | POA: Diagnosis not present

## 2018-12-23 ENCOUNTER — Telehealth: Payer: Self-pay

## 2018-12-23 NOTE — Telephone Encounter (Signed)
   COVID-19 Pre-Screening Questions:   Do you currently have a fever? NO (yes = cancel and refer to pcp for e-visit)  Have you recently travelled on a cruise, internationally, or to [place], [place], [place], [place], [place], or [place], [place] [employer]) ? NO  Have you been in contact with someone that is currently pending confirmation of Covid19 testing or has been confirmed to have the [place] virus?  NO  Have you been around anyone who has been exposed to Covid 19, or has mentioned symptoms of Covid 19 within the past 7 to 10 days? NO  Are you currently experiencing fatigue or cough? NO  In the past 7 to 10 days have you had a cough,  shortness of breath, headache, congestion, fever (100 or greater) body aches, chills, sore throat, or sudden loss of taste or sense of smell? NO  Patient reminded of appointment on 12/26/18 at 10:40a. Reiterated no additional visitors. Arrive no earlier than 15 minutes before appointment time. Please bring own mask.  Patient verbalized understanding and agreed with plan.

## 2018-12-25 ENCOUNTER — Encounter: Payer: Self-pay | Admitting: Cardiology

## 2018-12-25 NOTE — Progress Notes (Signed)
.     Past Medical History:  Diagnosis Date  . Anxiety   . Arthritis   . Constipation   . Depression   . Essential hypertension   . Fatty liver   . GERD (gastroesophageal reflux disease)   . History of migraine   . History of sleep apnea    Currently not using CPAP  . Hypothyroidism   . Multiple sclerosis ([place])   . Neurogenic bladder   . Neuropathy   . Palpitations   . Restless leg syndrome   . Skin cancer    Basal cell cancer on forehead, strong family hx of adenocarcinoma  . Type 2 diabetes mellitus ([place])    Past Surgical History:  Procedure Laterality Date  . CESAREAN SECTION     x 2  . CHOLECYSTECTOMY    . ENDOMETRIAL ABLATION    . JOINT REPLACEMENT    . TONSILLECTOMY    . TOTAL HIP ARTHROPLASTY Right 12/04/2015  . TOTAL HIP ARTHROPLASTY Right 12/04/2015   Procedure: TOTAL HIP ARTHROPLASTY ANTERIOR APPROACH;  Surgeon: Marybelle Killings, MD;  Location: [facility];  Service: Orthopedics;  Laterality: Right;  . TOTAL HIP ARTHROPLASTY Left 01/24/2016   Procedure: TOTAL HIP ARTHROPLASTY ANTERIOR APPROACH;  Surgeon: Marybelle Killings, MD;  Location: [facility];  Service: Orthopedics;  Laterality: Left;  anterior approach  . TUBAL LIGATION     Current Outpatient Medications on File Prior to Visit  Medication Sig Dispense Refill  . aspirin 81 MG tablet Take 81 mg by mouth daily.    . diazepam (VALIUM) 5 MG tablet TAKE 1 TABLET BY MOUTH THREE TIMES DAILY AS NEEDED 90 tablet 5  . DULoxetine (CYMBALTA) 60 MG capsule Take 1 capsule (60 mg total) by mouth at bedtime. 30 capsule 11  . mometasone (ELOCON) 0.1 % cream Apply 1 application topically daily as needed (dermatosis).     Marland Kitchen omeprazole (PRILOSEC) 20 MG capsule Take 20 mg by mouth daily.     No current facility-administered medications on file prior to visit.    Allergies  Allergen Reactions  . Shellfish Allergy Anaphylaxis  . Ciprofloxacin Hives  . Copaxone [Glatiramer Acetate] Hives  . Penicillins Hives    Has patient had a PCN reaction  causing immediate rash, facial/tongue/throat swelling, SOB or lightheadedness with hypotension:- -  rash Has patient had a PCN reaction causing severe rash involving mucus membranes or skin necrosis: no Has patient had a PCN reaction that required hospitalization {no Has patient had a PCN reaction occurring within the last 10 years: yes If all of the above answers are "NO", then may proceed with Cephalosporin use.  . Vancomycin Itching

## 2018-12-25 NOTE — Progress Notes (Signed)
Cardiology Office Note  Date: 12/26/2018   ID: JANAYE GYAMFI, DOB [birth year], MRN [id number]  PCP:  Caryl Bis, MD  Consulting Cardiologist:  Rozann Lesches, MD Electrophysiologist:  None   Chief Complaint  Patient presents with  . Chest Pain    History of Present Illness: Mary Strong is a 58 y.o. female referred for cardiology consultation by Dr. Leory Plowman for the evaluation of chest pain.  Records indicate recent admission to [facility] with chest pain.  I personally reviewed her ECG from 12/21/2018 which showed normal sinus rhythm with right bundle branch block.  Chest x-ray reported no acute process.  Troponin T levels were negative for ACS.  She was placed on Lopressor and discharged for follow-up with PCP and referral to cardiology.  There is mention in the discharge summary of concern for GERD symptoms.  We discussed her symptoms today.  The episode that took her to the ER was described as a feeling of "jitteriness" and chest pressure that occurred after she had gotten out of the shower.  She thought that her blood sugar was low, but checked it and found it to be in the 120s.  She belched a few times and symptoms resolved.  At baseline however, she also states that she feels chest tightness when her "heart rate goes up" such as with walking or other exercise.  This is been the case for the last year.  She feels somewhat short of breath with the symptoms as well.  I reviewed her medications.  She did not start the Lopressor as yet.  She has pending follow-up with Dr. Quillian Quince and also is due to see Dr. Laural Golden for routine GI follow-up.  She recalls undergoing a stress test several years ago, nothing more recent.  She has a relatively longstanding history of type 2 diabetes mellitus, and also hypertension.  There is no obvious premature CAD in her family.  She is currently disabled related to multiple sclerosis but previously worked as a Marine scientist for 30 years.  Past Medical History:  Diagnosis Date  . Anxiety   . Arthritis   . Constipation   . Depression   . Essential hypertension   . Fatty liver   . GERD (gastroesophageal reflux disease)   . History of migraine   . History of sleep apnea    Currently not using CPAP  . Hypothyroidism   . Multiple sclerosis ([place])   . Neurogenic bladder   . Neuropathy   . Palpitations   . Restless leg syndrome   . Skin cancer    Basal cell cancer on forehead, strong family hx of adenocarcinoma  . Type 2 diabetes mellitus ([place])     Past Surgical History:  Procedure Laterality Date  . CESAREAN SECTION     x 2  . CHOLECYSTECTOMY    . ENDOMETRIAL ABLATION    . JOINT REPLACEMENT    . TONSILLECTOMY    . TOTAL HIP ARTHROPLASTY Right 12/04/2015  . TOTAL HIP ARTHROPLASTY Right 12/04/2015   Procedure: TOTAL HIP ARTHROPLASTY ANTERIOR APPROACH;  Surgeon: Marybelle Killings, MD;  Location: [facility];  Service: Orthopedics;  Laterality: Right;  . TOTAL HIP ARTHROPLASTY Left 01/24/2016   Procedure: TOTAL HIP ARTHROPLASTY ANTERIOR APPROACH;  Surgeon: Marybelle Killings, MD;  Location: [facility];  Service: Orthopedics;  Laterality: Left;  anterior approach  . TUBAL LIGATION      Current Outpatient Medications  Medication Sig Dispense Refill  . aspirin 81 MG  tablet Take 81 mg by mouth daily.    . Dulaglutide (TRULICITY [place]) Inject into the skin.    . DULoxetine (CYMBALTA) 60 MG capsule Take 1 capsule (60 mg total) by mouth at bedtime. 30 capsule 11  . gabapentin (NEURONTIN) 600 MG tablet TAKE 1 TABLET BY MOUTH THREE TIMES DAILY 90 tablet 5  . levothyroxine (SYNTHROID) 112 MCG tablet Take 112 mcg by mouth daily before breakfast.    . mometasone (ELOCON) 0.1 % cream Apply 1 application topically daily as needed (dermatosis).     Marland Kitchen omeprazole (PRILOSEC) 20 MG capsule Take 20 mg by mouth daily.    . rosuvastatin (CRESTOR) 5 MG tablet Take 5 mg by mouth daily.    . diazepam (VALIUM) 5 MG tablet TAKE 1 TABLET BY MOUTH THREE TIMES DAILY AS  NEEDED 90 tablet 5  . metFORMIN (GLUCOPHAGE) 500 MG tablet Take 1 tablet (500 mg total) by mouth 2 (two) times daily.    . metoprolol tartrate (LOPRESSOR) 25 MG tablet Take 1 tablet (25 mg total) by mouth 2 (two) times daily.     No current facility-administered medications for this visit.    Allergies:  Shellfish allergy, Ciprofloxacin, Copaxone [glatiramer acetate], Penicillins, and Vancomycin   Social History: The patient  reports that she has never smoked. She has never used smokeless tobacco. She reports current alcohol use. She reports current drug use. Drug: Marijuana.   Family History: The patient's family history includes Arthritis/Rheumatoid in her mother; Multiple sclerosis in her mother; Psoriasis in her father.   ROS:  Please see the history of present illness. Otherwise, complete review of systems is positive for intermittent indigestion.  All other systems are reviewed and negative.   Physical Exam: VS:  BP 128/88   Pulse 90   Temp 98.4 F (36.9 C)   Ht 5\' 3"  (1.6 m)   Wt 204 lb (92.5 kg)   SpO2 98%   BMI 36.14 kg/m , BMI Body mass index is 36.14 kg/m.  Wt Readings from Last 3 Encounters:  12/26/18 204 lb (92.5 kg)  03/30/18 194 lb 8 oz (88.2 kg)  09/07/17 203 lb 8 oz (92.3 kg)    General: Patient appears comfortable at rest. HEENT: Conjunctiva and lids normal, wearing a mask. Neck: Supple, no elevated JVP or carotid bruits, no thyromegaly. Lungs: Clear to auscultation, nonlabored breathing at rest. Cardiac: Regular rate and rhythm, no S3 or significant systolic murmur, no pericardial rub. Abdomen: Soft, nontender, bowel sounds present. Extremities: No pitting edema, distal pulses 2+. Skin: Warm and dry. Musculoskeletal: No kyphosis. Neuropsychiatric: Alert and oriented x3, affect grossly appropriate.  ECG:  An ECG dated 11/25/2015 was personally reviewed today and demonstrated:  Normal sinus rhythm.  Recent Labwork:  August 2020: Hemoglobin 12.8, platelets  215, BUN 10, creatinine 0.94, AST 23, ALT 21, potassium 4.0, troponin T less than 0.013  Other Studies Reviewed Today:  Echocardiogram 05/11/2010: Study Conclusions   - Left ventricle: The cavity size was normal. Wall thickness was   increased in a pattern of mild LVH. There was mild focal basal   hypertrophy of the septum. Systolic function was normal. The   estimated ejection fraction was in the range of 60% to 65%. Wall   motion was normal; there were no regional wall motion   abnormalities. Left ventricular diastolic function parameters were   normal.  - Left atrium: The atrium was mildly dilated.  - Pulmonary arteries: Systolic pressure was mildly increased. PA   peak pressure: 45mm  Hg (S).   Assessment and Plan:  1.  Symptoms concerning for exertional angina in a 58 year old overweight woman with type 2 diabetes mellitus and hypertension.  Recent hospital stay noted at [facility] with negative cardiac enzymes and nonacute ECG.  We have discussed various cardiac testing strategies and at this point she prefers noninvasive imaging.  We will plan to proceed with a cardiac CTA and go from there.  I recommended that she go ahead and start her Lopressor and otherwise continue aspirin and statin therapy.  2.  Essential hypertension by history.  She was previously on combination ACE inhibitor and low-dose diuretic.  Plans to start Lopressor for now and follow-up with Dr. Quillian Quince.  3.  Mixed hyperlipidemia, on Crestor.  4.  Type 2 diabetes mellitus.  She is currently on Trulicity.  5.  Multiple sclerosis, followed by neurology.  Medication Adjustments/Labs and Tests Ordered: Current medicines are reviewed at length with the patient today.  Concerns regarding medicines are outlined above.   Tests Ordered: Orders Placed This Encounter  Procedures  . Basic metabolic panel    Medication Changes: Meds ordered this encounter  Medications  . metoprolol tartrate  (LOPRESSOR) 25 MG tablet    Sig: Take 1 tablet (25 mg total) by mouth 2 (two) times daily.  . metFORMIN (GLUCOPHAGE) 500 MG tablet    Sig: Take 1 tablet (500 mg total) by mouth 2 (two) times daily.    Disposition:  Follow up test results.  Signed, Satira Sark, MD, [facility] 12/26/2018 11:59 AM    [place] at [place], [place], [place] [address] Phone: [phone]; Fax: [phone]

## 2018-12-26 ENCOUNTER — Other Ambulatory Visit: Payer: Self-pay

## 2018-12-26 ENCOUNTER — Encounter (INDEPENDENT_AMBULATORY_CARE_PROVIDER_SITE_OTHER): Payer: Self-pay | Admitting: Nurse Practitioner

## 2018-12-26 ENCOUNTER — Ambulatory Visit (INDEPENDENT_AMBULATORY_CARE_PROVIDER_SITE_OTHER): Payer: Medicare Other | Admitting: Nurse Practitioner

## 2018-12-26 ENCOUNTER — Other Ambulatory Visit: Payer: Self-pay | Admitting: Neurology

## 2018-12-26 ENCOUNTER — Encounter: Payer: Self-pay | Admitting: Cardiology

## 2018-12-26 ENCOUNTER — Ambulatory Visit (INDEPENDENT_AMBULATORY_CARE_PROVIDER_SITE_OTHER): Payer: Medicare Other | Admitting: Cardiology

## 2018-12-26 VITALS — BP 161/92 | HR 101 | Temp 98.2°F | Ht 63.0 in | Wt 198.5 lb

## 2018-12-26 VITALS — BP 128/88 | HR 90 | Temp 98.4°F | Ht 63.0 in | Wt 204.0 lb

## 2018-12-26 DIAGNOSIS — R1084 Generalized abdominal pain: Secondary | ICD-10-CM | POA: Diagnosis not present

## 2018-12-26 DIAGNOSIS — R072 Precordial pain: Secondary | ICD-10-CM | POA: Diagnosis not present

## 2018-12-26 DIAGNOSIS — R109 Unspecified abdominal pain: Secondary | ICD-10-CM | POA: Insufficient documentation

## 2018-12-26 DIAGNOSIS — K5900 Constipation, unspecified: Secondary | ICD-10-CM | POA: Diagnosis not present

## 2018-12-26 DIAGNOSIS — R140 Abdominal distension (gaseous): Secondary | ICD-10-CM | POA: Diagnosis not present

## 2018-12-26 DIAGNOSIS — E782 Mixed hyperlipidemia: Secondary | ICD-10-CM | POA: Diagnosis not present

## 2018-12-26 DIAGNOSIS — E119 Type 2 diabetes mellitus without complications: Secondary | ICD-10-CM | POA: Diagnosis not present

## 2018-12-26 DIAGNOSIS — R079 Chest pain, unspecified: Secondary | ICD-10-CM

## 2018-12-26 DIAGNOSIS — I10 Essential (primary) hypertension: Secondary | ICD-10-CM | POA: Diagnosis not present

## 2018-12-26 DIAGNOSIS — I208 Other forms of angina pectoris: Secondary | ICD-10-CM | POA: Diagnosis not present

## 2018-12-26 DIAGNOSIS — Z01812 Encounter for preprocedural laboratory examination: Secondary | ICD-10-CM

## 2018-12-26 MED ORDER — METOPROLOL TARTRATE 25 MG PO TABS: 25.0000 mg | ORAL_TABLET | Freq: Two times a day (BID) | ORAL | Status: DC

## 2018-12-26 MED ORDER — LINACLOTIDE 145 MCG PO CAPS: 145.0000 ug | ORAL_CAPSULE | Freq: Every day | ORAL | 1 refills | Status: DC

## 2018-12-26 MED ORDER — METFORMIN HCL 500 MG PO TABS: 500.0000 mg | ORAL_TABLET | Freq: Two times a day (BID) | ORAL | Status: DC

## 2018-12-26 NOTE — Patient Instructions (Addendum)
Medication Instructions:  Continue all current medications.  Labwork:  BMET - order given today.   Do just prior to Coronary CT.  Testing/Procedures: Coronary CT - done at [facility]: Will call with results.    Any Other Special Instructions Will Be Listed Below (If Applicable).  If you need a refill on your cardiac medications before your next appointment, please call your pharmacy.  ---------------------------------------------------------------------------------   Your cardiac CT will be scheduled at one of the below locations:   [facility] [address] [place], [place] [address] [phone]  [place] [address] [place], [place] [address] [phone]  Please arrive at the [facility] main entrance of [facility] 30-45 minutes prior to test start time. Proceed to the [facility] Radiology Department (first floor) to check-in and test prep.  Please follow these instructions carefully (unless otherwise directed):  Hold all erectile dysfunction medications at least 48 hours prior to test.  On the Night Before the Test: . Be sure to Drink plenty of water. . Do not consume any caffeinated/decaffeinated beverages or chocolate 12 hours prior to your test. . Do not take any antihistamines 12 hours prior to your test. . If you take Metformin do not take 24 hours prior to test. . If the patient has contrast allergy: ? Patient will need a prescription for Prednisone and very clear instructions (as follows): 1. Prednisone 50 mg - take 13 hours prior to test 2. Take another Prednisone 50 mg 7 hours prior to test 3. Take another Prednisone 50 mg 1 hour prior to test 4. Take Benadryl 50 mg 1 hour prior to test . Patient must complete all four doses of above prophylactic medications. . Patient will need a ride after test due to Benadryl.  On the Day of the Test: . Drink plenty of  water. Do not drink any water within one hour of the test. . Do not eat any food 4 hours prior to the test. . You may take your regular medications prior to the test.  . Take metoprolol (Lopressor) two hours prior to test. . HOLD Furosemide/Hydrochlorothiazide morning of the test. . FEMALES- please wear underwire-free bra if available   *For Clinical Staff only. Please instruct patient the following:*        -Drink plenty of water       -Hold Furosemide/hydrochlorothiazide morning of the test       -Take metoprolol (Lopressor) 2 hours prior to test (if applicable).                  -If HR is less than 55 BPM- No Beta Blocker                -IF HR is greater than 55 BPM and patient is less than or equal to 49 yrs old Lopressor 100mg  x1.                -If HR is greater than 55 BPM and patient is greater than 68 yrs old Lopressor 50 mg x1.     Do not give Lopressor to patients with an allergy to lopressor or anyone with asthma or active COPD symptoms (currently taking steroids).       After the Test: . Drink plenty of water. . After receiving IV contrast, you may experience a mild flushed feeling. This is normal. . On occasion, you may experience a mild rash up to  24 hours after the test. This is not dangerous. If this occurs, you can take Benadryl 25 mg and increase your fluid intake. . If you experience trouble breathing, this can be serious. If it is severe call 911 IMMEDIATELY. If it is mild, please call our office. . If you take any of these medications: Glipizide/Metformin, Avandament, Glucavance, please do not take 48 hours after completing test.    Please contact the cardiac imaging nurse navigator should you have any questions/concerns Marchia Bond, RN Navigator Cardiac [facility] [phone] Office  [phone] Cell

## 2018-12-26 NOTE — Patient Instructions (Signed)
1. Complete the requested lab order  2. Complete your cardiology evaluation as planned  3. I will schedule you for an abdominal/pelvic CAT scan after I receive your blood test results   4. I will schedule your EGD and colonoscopy after you complete your cardiology evaluation   5. Start Linzess 170mcg one tab to be taken 30 minutes before breakfast for constipation  6. Call our office if your abdominal pain worsens

## 2018-12-27 NOTE — Progress Notes (Signed)
Subjective:    Patient ID: Mary Strong, female    DOB: May 08, 1960, 58 y.o.   MRN: [id number]  HPI  HPI: Mary Strong is a 58 year old female with a past medical history of hypertension, palpitations,  Multiple Sclerosis, DM II, sleep apnea, hypothyroidism, neurogenic bladder, arthritis, DJD, anxiety, depression, fatty liver and GERD. S/P cholecystectomy in 2011. She complains of having constipation for the past 6 months. She was taking Miralax as needed. She saw Dr. Olena Heckle, he increased Miralax twice daily x 2 weeks without improvement. She took 2 Dulcolax then tried 3 ex lax tabs both medications resulted in passing 4 to 5 loose stools with associated  cramping and abdominal bloat.  She can go up to 8 days without passing a bowel movement. She took 13  capfuls of Miralax approximately 1 week ago which resulted in passing watery stools for the next 5 days. Her last bowel movement was 4 days ago. No rectal bleeding. She reports passing a few pencil thin stools within the past few months. Increased belching. No weight loss. She is concerned about having cancer. Significant family history of cancer, maternal grandmother died from pancreatic cancer, maternal aunt died from stomach cancer and 3 great maternal aunts died from "intestinal cancer". She reports having 3 colonoscopies in the past, last colonoscopy was in 2001, no polyps per her report. It is unclear why she did not have a screening colonoscopy at the age of 42.  She presented to [facility] room 12/21/2018 due to having chest pain for she got out of the shower.  She reported having hypertension with tachycardia and was started on Lopressor.  Twelve-lead EKG identified a normal sinus rhythm with a right bundle branch block.  Troponin levels were negative.  She was advised to undergo further cardiac and GI evaluations.  She was seen by cardiologist Dr. Rozann Lesches on 12/26/2018.  Cardiac CTA was ordered as she wished to  avoid invasive cardiac evaluation.  At that time, she had not yet started the Lopressor, she was advised to start Lopressor and continue her aspirin and statin.  She denies having any current chest pain or pressure at this time.  She has infrequent heartburn, no dysphasia but has nonspecific upper abdominal pain that comes and goes.  No nausea or vomiting.  She is taking omeprazole 20 mg once daily more than 20 years. ? Past EGD.  She has not seen a gynecologist for more than 7 years.   She is a Therapist, sports, previously worked at [employer].  Past Medical History:  Diagnosis Date  . Anxiety   . Arthritis   . Constipation   . Depression   . Essential hypertension   . Fatty liver   . GERD (gastroesophageal reflux disease)   . History of migraine   . History of sleep apnea    Currently not using CPAP  . Hypothyroidism   . Multiple sclerosis ([place])   . Neurogenic bladder   . Neuropathy   . Palpitations   . Restless leg syndrome   . Skin cancer    Basal cell cancer on forehead, strong family hx of adenocarcinoma  . Type 2 diabetes mellitus ([place])    Past Surgical History:  Procedure Laterality Date  . CESAREAN SECTION     x 2  . CHOLECYSTECTOMY    . ENDOMETRIAL ABLATION    . JOINT REPLACEMENT    . TONSILLECTOMY    . TOTAL HIP ARTHROPLASTY Right 12/04/2015  .  TOTAL HIP ARTHROPLASTY Right 12/04/2015   Procedure: TOTAL HIP ARTHROPLASTY ANTERIOR APPROACH;  Surgeon: Marybelle Killings, MD;  Location: [facility];  Service: Orthopedics;  Laterality: Right;  . TOTAL HIP ARTHROPLASTY Left 01/24/2016   Procedure: TOTAL HIP ARTHROPLASTY ANTERIOR APPROACH;  Surgeon: Marybelle Killings, MD;  Location: [facility];  Service: Orthopedics;  Laterality: Left;  anterior approach  . TUBAL LIGATION     Current Outpatient Medications on File Prior to Visit  Medication Sig Dispense Refill  . aspirin 81 MG tablet Take 81 mg by mouth daily.    . diazepam (VALIUM) 5 MG tablet TAKE 1 TABLET BY MOUTH THREE TIMES DAILY AS NEEDED 90 tablet 5  .  DULoxetine (CYMBALTA) 60 MG capsule Take 1 capsule (60 mg total) by mouth at bedtime. 30 capsule 11  . mometasone (ELOCON) 0.1 % cream Apply 1 application topically daily as needed (dermatosis).     Marland Kitchen omeprazole (PRILOSEC) 20 MG capsule Take 20 mg by mouth daily.     No current facility-administered medications on file prior to visit.    Allergies  Allergen Reactions  . Shellfish Allergy Anaphylaxis  . Ciprofloxacin Hives  . Copaxone [Glatiramer Acetate] Hives  . Penicillins Hives    Has patient had a PCN reaction causing immediate rash, facial/tongue/throat swelling, SOB or lightheadedness with hypotension:- -  rash Has patient had a PCN reaction causing severe rash involving mucus membranes or skin necrosis: no Has patient had a PCN reaction that required hospitalization {no Has patient had a PCN reaction occurring within the last 10 years: yes If all of the above answers are "NO", then may proceed with Cephalosporin use.  . Vancomycin Itching    Review of Systems See all HPI, all other systems reviewed and are negative      Objective:   Physical Exam Blood pressure (!) 161/92, pulse (!) 101, temperature 98.2 F (36.8 C), temperature source Oral, height 5\' 3"  (1.6 m), weight 198 lb 8 oz (90 kg).  General: 58 year old female in no acute distress Eyes: Sclera nonicteric, conjunctiva pink Mouth: Dentition intact, no ulcers Neck: Supple, no thyromegaly or lymphadenopathy Heart: Tachycardic, no murmurs Lungs: Clear throughout Abdomen: Soft, generalized tenderness without rebound or guarding, positive bowel sounds to all 4 quadrants, no HSM Extremities: No edema Neuro: Alert and oriented x4, no focal deficits     Assessment & Plan:  59.  58 year old female with a history of MS resents with complaints of constipation, abdominal bloat and generalized abdominal pain -CBC, CMP and CRP -Abdominal/pelvic CT with oral and IV contrast (To be scheduled after BUN/Cr results received. Also,  she will call me when she has a date for her cardiac CTA and I will coordinate her Abd/pelvic CT to avoid excessive IV contrast in a short  Interval of time). -Trial with Linzess 145 mcg 1 tablet to be taken 30 minutes before breakfast -Advised gyn consult, assess ovaries in setting of abdominal bloat  2. Significant family history of GI and pancreatic cancer -We will schedule EGD and colonoscopy after cardiac evaluation completed  3.  Chest pain, cardiac versus GI etiology -Patient to proceed with her planned cardiac evaluation -We will schedule EGD after cardiac evaluation completed -Continue Omeprazole 20mg  once daily   4. Hx of a fatty liver -CMP -await abd/pelvic CT results   5. DM II

## 2018-12-29 LAB — CBC WITH DIFFERENTIAL/PLATELET
Absolute Monocytes: 378 cells/uL (ref 200–950)
Basophils Absolute: 12 cells/uL (ref 0–200)
Basophils Relative: 0.2 %
Eosinophils Absolute: 62 cells/uL (ref 15–500)
Eosinophils Relative: 1 %
HCT: 37.4 % (ref 35.0–45.0)
Hemoglobin: 12.4 g/dL (ref 11.7–15.5)
Lymphs Abs: 1451 cells/uL (ref 850–3900)
MCH: 25.9 pg — ABNORMAL LOW (ref 27.0–33.0)
MCHC: 33.2 g/dL (ref 32.0–36.0)
MCV: 78.2 fL — ABNORMAL LOW (ref 80.0–100.0)
MPV: 12 fL (ref 7.5–12.5)
Monocytes Relative: 6.1 %
Neutro Abs: 4297 cells/uL (ref 1500–7800)
Neutrophils Relative %: 69.3 %
Platelets: 183 10*3/uL (ref 140–400)
RBC: 4.78 10*6/uL (ref 3.80–5.10)
RDW: 16.1 % — ABNORMAL HIGH (ref 11.0–15.0)
Total Lymphocyte: 23.4 %
WBC: 6.2 10*3/uL (ref 3.8–10.8)

## 2018-12-29 LAB — COMPLETE METABOLIC PANEL WITH GFR
AG Ratio: 1.7 (calc) (ref 1.0–2.5)
ALT: 19 U/L (ref 6–29)
AST: 21 U/L (ref 10–35)
Albumin: 4 g/dL (ref 3.6–5.1)
Alkaline phosphatase (APISO): 88 U/L (ref 37–153)
BUN: 12 mg/dL (ref 7–25)
CO2: 29 mmol/L (ref 20–32)
Calcium: 9.2 mg/dL (ref 8.6–10.4)
Chloride: 106 mmol/L (ref 98–110)
Creat: 0.9 mg/dL (ref 0.50–1.05)
GFR, Est African American: 82 mL/min/{1.73_m2} (ref >=60)
GFR, Est Non African American: 71 mL/min/{1.73_m2} (ref >=60)
Globulin: 2.4 g/dL (calc) (ref 1.9–3.7)
Glucose, Bld: 84 mg/dL (ref 65–139)
Potassium: 4.1 mmol/L (ref 3.5–5.3)
Sodium: 141 mmol/L (ref 135–146)
Total Bilirubin: 0.3 mg/dL (ref 0.2–1.2)
Total Protein: 6.4 g/dL (ref 6.1–8.1)

## 2018-12-29 LAB — CELIAC DISEASE PANEL
(tTG) Ab, IgA: 1 U/mL
(tTG) Ab, IgG: 3 U/mL
Gliadin IgA: 13 Units
Gliadin IgG: 3 Units
Immunoglobulin A: 263 mg/dL (ref 47–310)

## 2018-12-29 LAB — C-REACTIVE PROTEIN: CRP: 7.2 mg/L (ref <8.0)

## 2018-12-29 NOTE — Addendum Note (Signed)
Addended by: Laurine Blazer on: 12/29/2018 09:21 AM   Modules accepted: Orders

## 2019-01-02 DIAGNOSIS — I10 Essential (primary) hypertension: Secondary | ICD-10-CM | POA: Diagnosis not present

## 2019-01-02 DIAGNOSIS — E1165 Type 2 diabetes mellitus with hyperglycemia: Secondary | ICD-10-CM | POA: Diagnosis not present

## 2019-01-03 ENCOUNTER — Telehealth (HOSPITAL_COMMUNITY): Payer: Self-pay | Admitting: Emergency Medicine

## 2019-01-03 NOTE — Telephone Encounter (Signed)
Reaching out to patient to offer assistance regarding upcoming cardiac imaging study; pt verbalizes understanding of appt date/time, parking situation and where to check in, pre-test NPO status and medications ordered, and verified current allergies; name and call back number provided for further questions should they arise Yasmina Chico RN Navigator Cardiac Imaging [facility] [phone] office [phone] cell 

## 2019-01-04 ENCOUNTER — Other Ambulatory Visit: Payer: Self-pay

## 2019-01-04 ENCOUNTER — Encounter (HOSPITAL_COMMUNITY): Payer: Self-pay

## 2019-01-04 ENCOUNTER — Ambulatory Visit (HOSPITAL_COMMUNITY)
Admission: RE | Admit: 2019-01-04 | Discharge: 2019-01-04 | Disposition: A | Discharge status: Home / Self Care | Payer: Medicare Other | Source: Ambulatory Visit | Attending: Cardiology | Admitting: Cardiology

## 2019-01-04 DIAGNOSIS — I208 Other forms of angina pectoris: Secondary | ICD-10-CM | POA: Diagnosis not present

## 2019-01-04 DIAGNOSIS — Z006 Encounter for examination for normal comparison and control in clinical research program: Secondary | ICD-10-CM

## 2019-01-04 DIAGNOSIS — R072 Precordial pain: Secondary | ICD-10-CM | POA: Insufficient documentation

## 2019-01-04 DIAGNOSIS — Z23 Encounter for immunization: Secondary | ICD-10-CM | POA: Diagnosis not present

## 2019-01-04 IMAGING — CT CT HEART MORP W/ CTA COR W/ SCORE W/ CA W/CM &/OR W/O CM
2 series · 14 of 20 positions shown, 16 images · IV contrast (APPLIED)
Comparison: None.
COMPARISON: None.

Addendum:
EXAM:
OVER-READ INTERPRETATION  CT CHEST

The following report is an over-read performed by radiologist Dr.
Xiang Mesfin [REDACTED] on 01/04/2019. This
over-read does not include interpretation of cardiac or coronary
anatomy or pathology. The coronary calcium score/coronary CTA
interpretation by the cardiologist is attached.
CLINICAL DATA: 58F with hypertension, OSA and chest pain.
Cardiac/Coronary  CT
TECHNIQUE: The patient was scanned on a [employer] Force scanner.

[Series 16: (id) · axial · 0.39mm/px · z∈[+1173,+1266]mm · 8 of 301 slices shown, 10 images (1 of 2)]
[im 34/301  vessel]
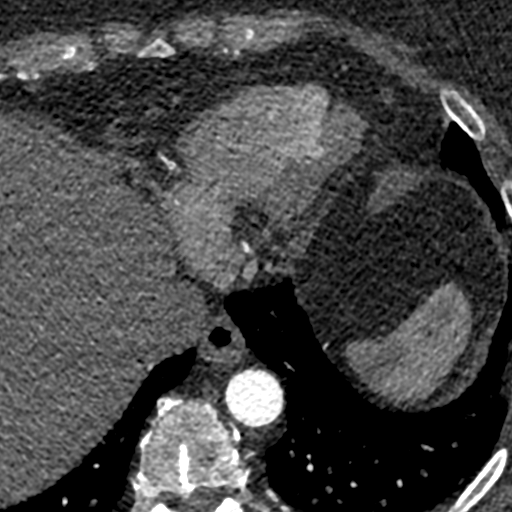
[im 34/301  lung]
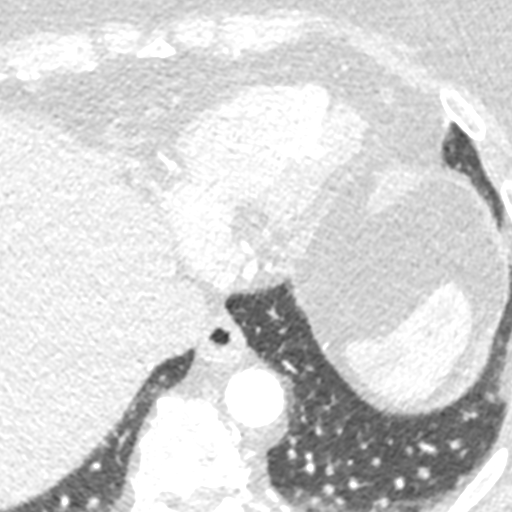
[im 67/301  vessel]
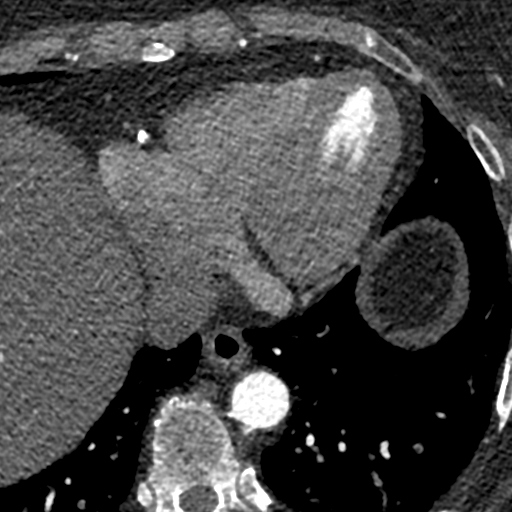
[im 101/301  vessel]
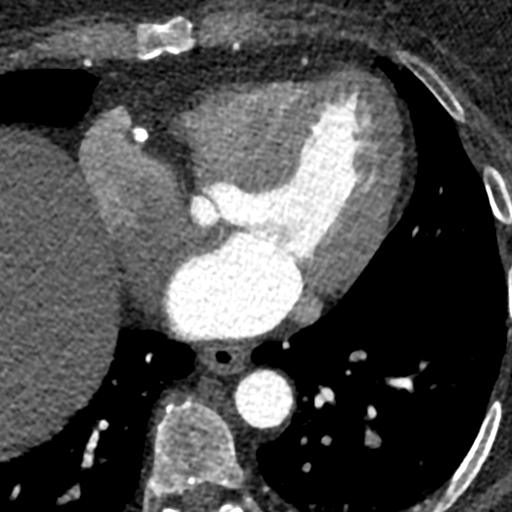
[im 134/301  vessel]
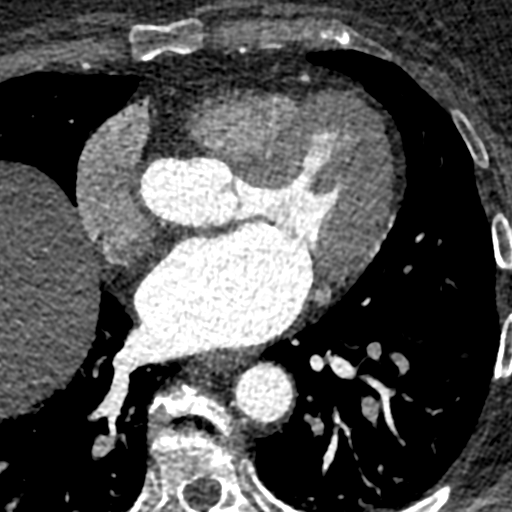
[im 167/301  vessel]
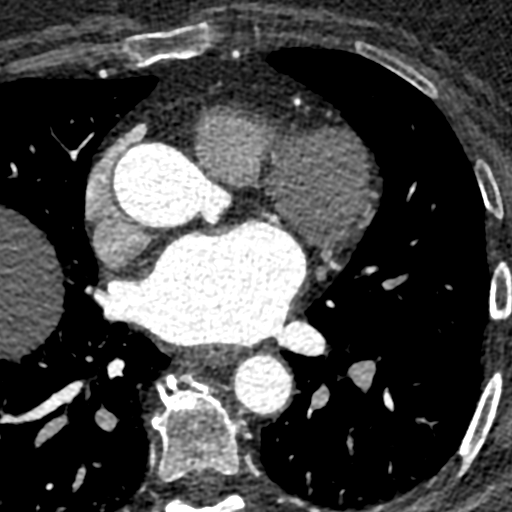
[im 167/301  lung]
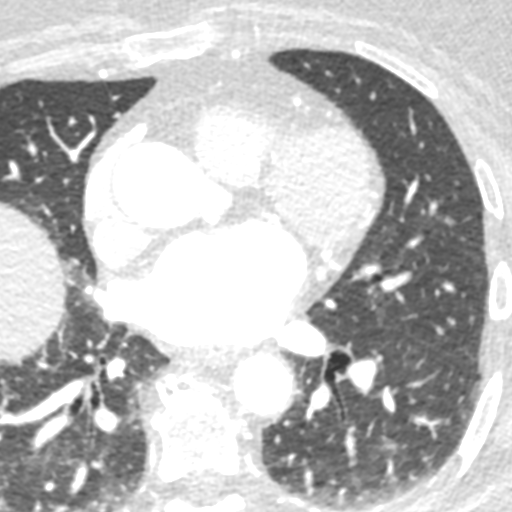
[im 201/301  vessel]
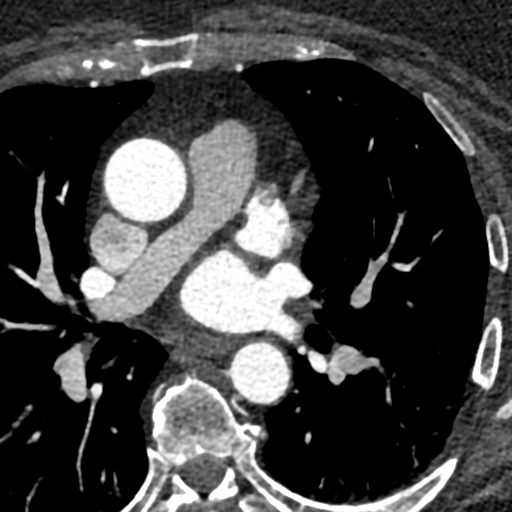
[im 234/301  vessel]
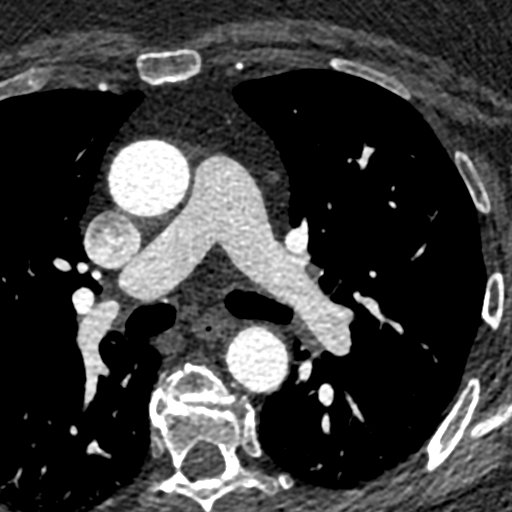
[im 267/301  vessel]
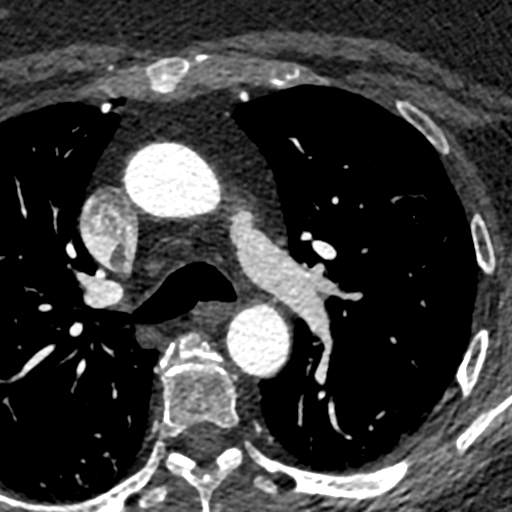

[Series 17: (id) · axial · 0.39mm/px · z∈[+1173,+1240]mm · 6 of 301 slices shown (2 of 2)]
[im 34/301  vessel]
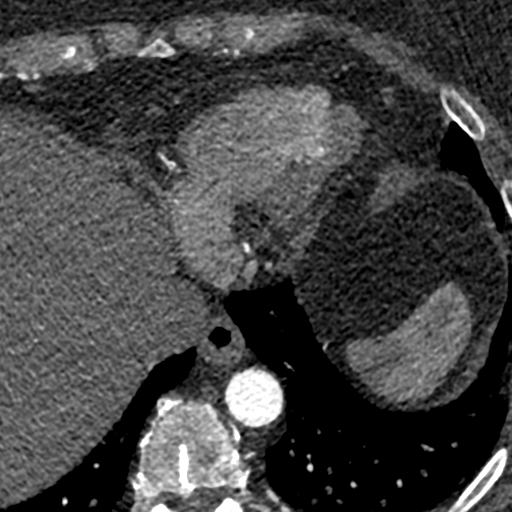
[im 67/301  vessel]
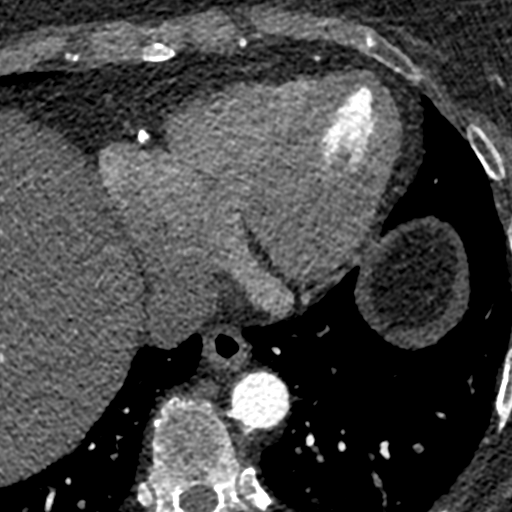
[im 101/301  vessel]
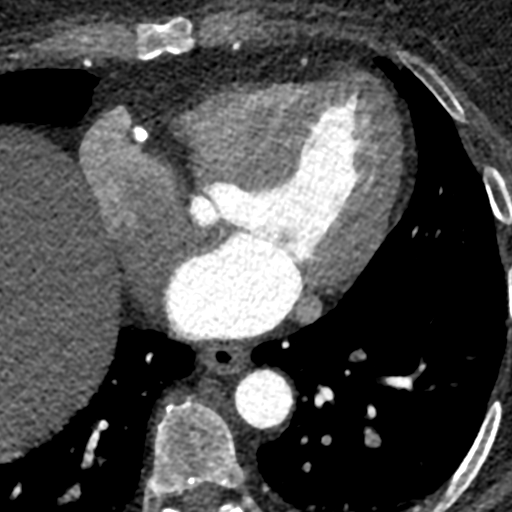
[im 134/301  vessel]
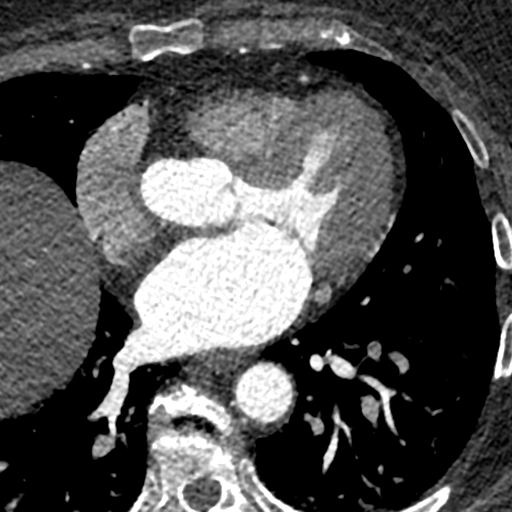
[im 167/301  vessel]
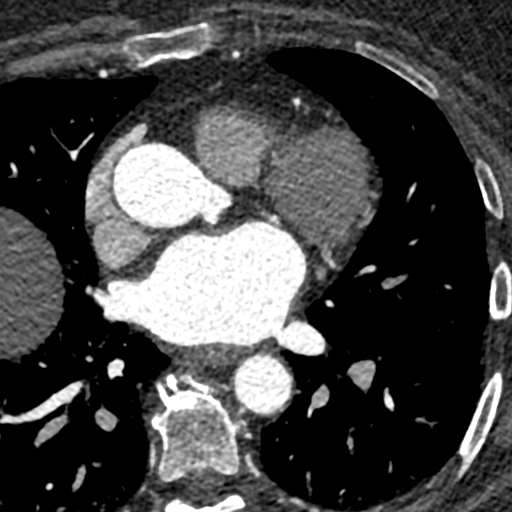
[im 201/301  vessel]
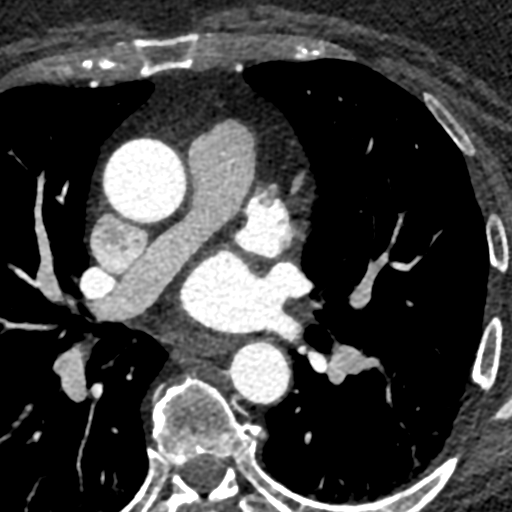

[14 of 20 positions shown; findings below may reference images not displayed]

FINDINGS: Within the visualized portions of the thorax there are no suspicious
appearing pulmonary nodules or masses, there is no acute
consolidative airspace disease, no pleural effusions, no
pneumothorax and no lymphadenopathy. Visualized portions of the
upper abdomen are unremarkable. There are no aggressive appearing
lytic or blastic lesions noted in the visualized portions of the
skeleton.
IMPRESSION: 1. Within the visualized portions of the thorax there are no
suspicious appearing pulmonary nodules or masses, there is no acute
consolidative airspace disease, no pleural effusions, no
pneumothorax and no lymphadenopathy. Visualized portions of the
upper abdomen are unremarkable. There are no aggressive appearing
lytic or blastic lesions noted in the visualized portions of the
skeleton.
FINDINGS: A 120 kV prospective scan was triggered in the descending thoracic
aorta at 111 HU's. Axial non-contrast 3 mm slices were carried out
through the heart. The data set was analyzed on a dedicated work
station and scored using the Agatson method. Gantry rotation speed
was 250 msecs and collimation was .6 mm. No beta blockade and 0.8 mg
of sl NTG was given. The 3D data set was reconstructed in 5%
intervals of the 67-82 % of the R-R cycle. Diastolic phases were
analyzed on a dedicated work station using MPR, MIP and VRT modes.
The patient received 80 cc of contrast.

Aorta: Normal size. Ascending aorta 2.8 cm. No calcifications. No
dissection.

Aortic Valve:  Trileaflet.  No calcifications.

Coronary Arteries:  Normal coronary origin.  Right dominance.

RCA is a large dominant artery that gives rise to PDA and two SHEFFIELD
branches. The RCA is heavily calcified. There is at least moderate
(50-69%) mixed plaque in the proximal to mid RCA with one region
that appears severe (>70%). The PDA is heavily calcified. The PDA is
too small to determine the stenosis severity.

Left main is a large artery that gives rise to LAD and LCX arteries.

LAD is a large vessel that is heavily calcified. There is at least
moderate (50-69%) obstruction proximally. Just proximal to D1 there
is severe (>70%) calcified plaque.

LCX is a non-dominant artery that gives rise to OM1 and OM2
branches. There is mild (25-49%) calcified plaque proximally. There
is non-obstructive calcified plaque in mid OM2.

Other findings:

Normal pulmonary vein drainage into the left atrium.

Normal let atrial appendage without a thrombus.

Normal size of the pulmonary artery.
IMPRESSION: 1. Coronary calcium score of 9641. This was 99th percentile for age
and sex matched control.

2. Normal coronary origin with right dominance.

3. Heavily calcified LAD and RCA arteries. Coronary calcification
can cause blooming artifact and overestimation of plaque severity.
However, in both the RCA and LAD there are regions that demonstrate
calcification across the entire lumen without evidence of contrast,
concerning for high degree stenosis.

4.  Will send study for FFRct.

5.  Recommend high potency statin and aspirin.

6.  Consider cardiac catheterization.

*** End of Addendum ***
EXAM:
OVER-READ INTERPRETATION  CT CHEST

The following report is an over-read performed by radiologist Dr.
Xiang Mesfin [REDACTED] on 01/04/2019. This
over-read does not include interpretation of cardiac or coronary
anatomy or pathology. The coronary calcium score/coronary CTA
interpretation by the cardiologist is attached.
FINDINGS: Within the visualized portions of the thorax there are no suspicious
appearing pulmonary nodules or masses, there is no acute
consolidative airspace disease, no pleural effusions, no
pneumothorax and no lymphadenopathy. Visualized portions of the
upper abdomen are unremarkable. There are no aggressive appearing
lytic or blastic lesions noted in the visualized portions of the
skeleton.
IMPRESSION: 1. Within the visualized portions of the thorax there are no
suspicious appearing pulmonary nodules or masses, there is no acute
consolidative airspace disease, no pleural effusions, no
pneumothorax and no lymphadenopathy. Visualized portions of the
upper abdomen are unremarkable. There are no aggressive appearing
lytic or blastic lesions noted in the visualized portions of the
skeleton.

## 2019-01-04 MED ORDER — METOPROLOL TARTRATE 5 MG/5ML IV SOLN
INTRAVENOUS | Status: AC
  Administered 2019-01-04: 14:00:00 10 mg via INTRAVENOUS
  Filled 2019-01-04: qty 10

## 2019-01-04 MED ORDER — METOPROLOL TARTRATE 5 MG/5ML IV SOLN
INTRAVENOUS | Status: AC
  Administered 2019-01-04: 15:00:00 20 mg via INTRAVENOUS
  Filled 2019-01-04: qty 20

## 2019-01-04 MED ORDER — METOPROLOL TARTRATE 5 MG/5ML IV SOLN
INTRAVENOUS | Status: AC
  Administered 2019-01-04: 15:00:00 10 mg via INTRAVENOUS
  Filled 2019-01-04: qty 10

## 2019-01-04 MED ORDER — NITROGLYCERIN 0.4 MG SL SUBL
0.8000 mg | SUBLINGUAL_TABLET | Freq: Once | SUBLINGUAL | Status: AC
  Administered 2019-01-04: 15:00:00 0.8 mg via SUBLINGUAL
  Filled 2019-01-04: qty 25

## 2019-01-04 MED ORDER — METOPROLOL TARTRATE 5 MG/5ML IV SOLN
10.0000 mg | INTRAVENOUS | Status: AC | PRN
  Administered 2019-01-04 (×3): 10 mg via INTRAVENOUS
  Filled 2019-01-04 (×4): qty 10

## 2019-01-04 MED ORDER — METOPROLOL TARTRATE 5 MG/5ML IV SOLN
20.0000 mg | Freq: Once | INTRAVENOUS | Status: AC
  Administered 2019-01-04: 15:00:00 20 mg via INTRAVENOUS
  Filled 2019-01-04: qty 20

## 2019-01-04 MED ORDER — DILTIAZEM HCL 25 MG/5ML IV SOLN
5.0000 mg | Freq: Once | INTRAVENOUS | Status: AC
  Administered 2019-01-04: 15:00:00 5 mg via INTRAVENOUS
  Filled 2019-01-04: qty 5

## 2019-01-04 MED ORDER — DILTIAZEM HCL 25 MG/5ML IV SOLN
INTRAVENOUS | Status: AC
  Administered 2019-01-04: 15:00:00 5 mg via INTRAVENOUS
  Filled 2019-01-04: qty 5

## 2019-01-04 MED ORDER — NITROGLYCERIN 0.4 MG SL SUBL
SUBLINGUAL_TABLET | SUBLINGUAL | Status: AC
  Administered 2019-01-04: 15:00:00 0.8 mg via SUBLINGUAL
  Filled 2019-01-04: qty 2

## 2019-01-04 MED ORDER — IOHEXOL 350 MG/ML SOLN
100.0000 mL | Freq: Once | INTRAVENOUS | Status: AC | PRN
  Administered 2019-01-04: 15:00:00 100 mL via INTRAVENOUS

## 2019-01-04 NOTE — Research (Signed)
Cadfem Informed Consent    Patient Name: Mary Strong   Subject met inclusion and exclusion criteria.  The informed consent form, study requirements and expectations were reviewed with the subject and questions and concerns were addressed prior to the signing of the consent form.  The subject verbalized understanding of the trail requirements.  The subject agreed to participate in the CADFEM trial and signed the informed consent.  The informed consent was obtained prior to performance of any protocol-specific procedures for the subject.  A copy of the signed informed consent was given to the subject and a copy was placed in the subject's medical record.   Neva Seat

## 2019-01-04 NOTE — Progress Notes (Signed)
Notified Dr Oval Linsey that HR remains at 74 after 20mg  IV metoprolol.  Verbal orders from Dr Oval Linsey: give 5mg  IV diltiazem x1 now, if that does not decrease HR, just proceed with the CT scan.  Notified Brittney (CT tech) of the orders.  Will cont to monitor

## 2019-01-04 NOTE — Progress Notes (Signed)
Notified Dr Oval Linsey that the patients HR has minimally decreased after total of 30mg  IV metoprolol given (plus patient took 25mg  Meotporolol PO 2 hrs before scan).  Received verbal order from Dr Oval Linsey to admin 20mg  Meoprolol IV x1 now.  Will cont to monitor

## 2019-01-04 NOTE — Discharge Instructions (Signed)
Testing With IV Contrast Material °IV contrast material is a fluid that is used with some imaging tests. It is injected into your body through a vein. Contrast material is used when your health care providers need a detailed look at organs, tissues, or blood vessels that may not show up with the standard test. The material may be used when an X-ray, an MRI, a CT scan, or an ultrasound is done. °IV contrast material may be used for imaging tests that check: °· Muscles, skin, and fat. °· Breasts. °· Brain. °· Digestive tract. °· Heart. °· Organs such as the liver, kidneys, lungs, bladder, and many others. °· Arteries and veins. °Tell a health care provider about: °· Any allergies you have, especially an allergy to contrast material. °· All medicines you are taking, including metformin, beta blockers, NSAIDs (such as ibuprofen), interleukin-2, vitamins, herbs, eye drops, creams, and over-the-counter medicines. °· Any problems you or family members have had with the use of contrast material. °· Any blood disorders you have, such as sickle cell anemia. °· Any surgeries you have had. °· Any medical conditions you have or have had, especially alcohol abuse, dehydration, asthma, or kidney, liver, or heart problems. °· Whether you are pregnant or may be pregnant. °· Whether you are breastfeeding. Most contrast materials are safe for use in breastfeeding women. °What are the risks? °Generally, this is a safe procedure. However, problems may occur, including: °· Headache. °· Itching, skin rash, and hives. °· Nausea and vomiting. °· Allergic reactions. °· Wheezing or difficulty breathing. °· Abnormal heart rate. °· Changes in blood pressure. °· Throat swelling. °· Kidney damage. °What happens before the procedure? °Medicines °Ask your health care provider about: °· Changing or stopping your regular medicines. This is especially important if you are taking diabetes medicines or blood thinners. °· Taking medicines such as aspirin  and ibuprofen. These medicines can thin your blood. Do not take these medicines unless your health care provider tells you to take them. °· Taking over-the-counter medicines, vitamins, herbs, and supplements. °If you are at risk of having a reaction to the IV contrast material, you may be asked to take medicine before the procedure to prevent a reaction. °General instructions °· Follow instructions from your health care provider about eating or drinking restrictions. °· You may have an exam or lab tests to make sure that you can safely get IV contrast material. °· Ask if you will be given a medicine to help you relax (sedative) during the procedure. If so, plan to have someone take you home from the hospital or clinic. °What happens during the procedure? °· You may be given a sedative to help you relax. °· An IV will be inserted into one of your veins. °· Contrast material will be injected into your IV. °· You may feel warmth or flushing as the contrast material enters your bloodstream. °· You may have a metallic taste in your mouth for a few minutes. °· The needle may cause some discomfort and bruising. °· After the contrast material is in your body, the imaging test will be done. °The procedure may vary among health care providers and hospitals. °What can I expect after the procedure? °· The IV will be removed. °· You may be taken to a recovery area if sedation medicines were used. Your blood pressure, heart rate, breathing rate, and blood oxygen level will be monitored until you leave the hospital or clinic. °Follow these instructions at home: ° °· Take over-the-counter and   prescription medicines only as told by your health care provider. °? Your health care provider may tell you to not take certain medicines for a couple of days after the procedure. This is especially important if you are taking diabetes medicines. °· If you are told, drink enough fluid to keep your urine pale yellow. This will help to remove  the contrast material out of your body. °· Do not drive for 24 hours if you were given a sedative during your procedure. °· It is up to you to get the results of your procedure. Ask your health care provider, or the department that is doing the procedure, when your results will be ready. °· Keep all follow-up visits as told by your health care provider. This is important. °Contact a health care provider if: °· You have redness, swelling, or pain near your IV site. °Get help right away if: °· You have an abnormal heart rhythm. °· You have trouble breathing. °· You have: °? Chest pain. °? Pain in your back, neck, arm, jaw, or stomach. °? Nausea or sweating. °? Hives or a rash. °· You start shaking and cannot stop. °These symptoms may represent a serious problem that is an emergency. Do not wait to see if the symptoms will go away. Get medical help right away. Call your local emergency services (911 in the [place] Do not drive yourself to the hospital. °Summary °· IV contrast material may be used for imaging tests to help your health care providers see your organs and tissues more clearly. °· Tell your health care provider if you are pregnant or may be pregnant. °· During the procedure, you may feel warmth or flushing as the contrast material enters your bloodstream. °· After the procedure, drink enough fluid to keep your urine pale yellow. °This information is not intended to replace advice given to you by your health care provider. Make sure you discuss any questions you have with your health care provider. °Document Released: 04/08/2009 Document Revised: 07/07/2018 Document Reviewed: 07/07/2018 °Elsevier Patient Education © 2020 [employer] ° ° °Cardiac CT Angiogram ° °A cardiac CT angiogram is a procedure to look at the heart and the area around the heart. It may be done to help find the cause of chest pains or other symptoms of heart disease. During this procedure, a large X-ray machine, called a CT scanner,  takes detailed pictures of the heart and the surrounding area after a dye (contrast material) has been injected into blood vessels in the area. The procedure is also sometimes called a coronary CT angiogram, coronary artery scanning, or CTA. °A cardiac CT angiogram allows the health care provider to see how well blood is flowing to and from the heart. The health care provider will be able to see if there are any problems, such as: °· Blockage or narrowing of the coronary arteries in the heart. °· Fluid around the heart. °· Signs of weakness or disease in the muscles, valves, and tissues of the heart. °Tell a health care provider about: °· Any allergies you have. This is especially important if you have had a previous allergic reaction to contrast dye. °· All medicines you are taking, including vitamins, herbs, eye drops, creams, and over-the-counter medicines. °· Any blood disorders you have. °· Any surgeries you have had. °· Any medical conditions you have. °· Whether you are pregnant or may be pregnant. °· Any anxiety disorders, chronic pain, or other conditions you have that may increase your stress or prevent   you from lying still. °What are the risks? °Generally, this is a safe procedure. However, problems may occur, including: °· Bleeding. °· Infection. °· Allergic reactions to medicines or dyes. °· Damage to other structures or organs. °· Kidney damage from the dye or contrast that is used. °· Increased risk of cancer from radiation exposure. This risk is low. Talk with your health care provider about: °? The risks and benefits of testing. °? How you can receive the lowest dose of radiation. °What happens before the procedure? °· Wear comfortable clothing and remove any jewelry, glasses, dentures, and hearing aids. °· Follow instructions from your health care provider about eating and drinking. This may include: °? For 12 hours before the test -- avoid caffeine. This includes tea, coffee, soda, energy drinks,  and diet pills. Drink plenty of water or other fluids that do not have caffeine in them. Being well-hydrated can prevent complications. °? For 4-6 hours before the test -- stop eating and drinking. The contrast dye can cause nausea, but this is less likely if your stomach is empty. °· Ask your health care provider about changing or stopping your regular medicines. This is especially important if you are taking diabetes medicines, blood thinners, or medicines to treat erectile dysfunction. °What happens during the procedure? °· Hair on your chest may need to be removed so that small sticky patches called electrodes can be placed on your chest. These will transmit information that helps to monitor your heart during the test. °· An IV tube will be inserted into one of your veins. °· You might be given a medicine to control your heart rate during the test. This will help to ensure that good images are obtained. °· You will be asked to lie on an exam table. This table will slide in and out of the CT machine during the procedure. °· Contrast dye will be injected into the IV tube. You might feel warm, or you may get a metallic taste in your mouth. °· You will be given a medicine (nitroglycerin) to relax (dilate) the arteries in your heart. °· The table that you are lying on will move into the CT machine tunnel for the scan. °· The person running the machine will give you instructions while the scans are being done. You may be asked to: °? Keep your arms above your head. °? Hold your breath. °? Stay very still, even if the table is moving. °· When the scanning is complete, you will be moved out of the machine. °· The IV tube will be removed. °The procedure may vary among health care providers and hospitals. °What happens after the procedure? °· You might feel warm, or you may get a metallic taste in your mouth from the contrast dye. °· You may have a headache from the nitroglycerin. °· After the procedure, drink water or  other fluids to wash (flush) the contrast material out of your body. °· Contact a health care provider if you have any symptoms of allergy to the contrast. These symptoms include: °? Shortness of breath. °? Rash or hives. °? A racing heartbeat. °· Most people can return to their normal activities right after the procedure. Ask your health care provider what activities are safe for you. °· It is up to you to get the results of your procedure. Ask your health care provider, or the department that is doing the procedure, when your results will be ready. °Summary °· A cardiac CT angiogram is a procedure to   look at the heart and the area around the heart. It may be done to help find the cause of chest pains or other symptoms of heart disease. °· During this procedure, a large X-ray machine, called a CT scanner, takes detailed pictures of the heart and the surrounding area after a dye (contrast material) has been injected into blood vessels in the area. °· Ask your health care provider about changing or stopping your regular medicines before the procedure. This is especially important if you are taking diabetes medicines, blood thinners, or medicines to treat erectile dysfunction. °· After the procedure, drink water or other fluids to wash (flush) the contrast material out of your body. °This information is not intended to replace advice given to you by your health care provider. Make sure you discuss any questions you have with your health care provider. °Document Released: 04/02/2008 Document Revised: 04/02/2017 Document Reviewed: 03/09/2016 °Elsevier Patient Education © 2020 [employer] ° °

## 2019-01-04 NOTE — Progress Notes (Signed)
Pt tolerated exam without incident.  Provided patient with caffeinated beverage and crackers.  PIV removed and dressing applied.  Discharge instructions discussed.  Pt discharged

## 2019-01-05 ENCOUNTER — Telehealth: Payer: Self-pay | Admitting: Cardiology

## 2019-01-05 DIAGNOSIS — I2510 Atherosclerotic heart disease of native coronary artery without angina pectoris: Secondary | ICD-10-CM | POA: Diagnosis not present

## 2019-01-05 NOTE — Telephone Encounter (Signed)
Patient saw that her results are back from test yesterday.  She is concerned about seeing she needs a cath and would like for someone to contact her

## 2019-01-05 NOTE — Telephone Encounter (Signed)
Patient advised and verbalized understanding.  Will call back with appointment information Copy sent to PCP

## 2019-01-05 NOTE — Telephone Encounter (Signed)

## 2019-01-05 NOTE — Telephone Encounter (Signed)
-----   Message from Satira Sark, MD sent at 01/05/2019  8:27 AM EDT ----- Results reviewed.  I reviewed the coronary CTA report and now the FFR analysis.  She has a very high calcium score and evidence of coronary artery disease involving all vessels, but looks to be most significant in the LAD.  I would agree that a cardiac catheterization should be scheduled for further assessment and potential for revascularization.

## 2019-01-06 ENCOUNTER — Other Ambulatory Visit: Payer: Self-pay

## 2019-01-06 ENCOUNTER — Encounter: Payer: Self-pay | Admitting: *Deleted

## 2019-01-06 ENCOUNTER — Other Ambulatory Visit (HOSPITAL_COMMUNITY)
Admission: RE | Admit: 2019-01-06 | Discharge: 2019-01-06 | Disposition: A | Discharge status: Home / Self Care | Payer: Medicare Other | Source: Ambulatory Visit | Attending: Cardiology | Admitting: Cardiology

## 2019-01-06 ENCOUNTER — Telehealth: Payer: Self-pay | Admitting: Cardiology

## 2019-01-06 ENCOUNTER — Telehealth: Payer: Self-pay

## 2019-01-06 ENCOUNTER — Other Ambulatory Visit: Payer: Self-pay | Admitting: *Deleted

## 2019-01-06 ENCOUNTER — Encounter: Payer: Self-pay | Admitting: Cardiology

## 2019-01-06 ENCOUNTER — Ambulatory Visit (INDEPENDENT_AMBULATORY_CARE_PROVIDER_SITE_OTHER): Payer: Medicare Other | Admitting: Cardiology

## 2019-01-06 ENCOUNTER — Other Ambulatory Visit: Payer: Self-pay | Admitting: Cardiology

## 2019-01-06 VITALS — BP 140/98 | HR 76 | Ht 63.0 in | Wt 197.0 lb

## 2019-01-06 DIAGNOSIS — Z01818 Encounter for other preprocedural examination: Secondary | ICD-10-CM | POA: Diagnosis not present

## 2019-01-06 DIAGNOSIS — R931 Abnormal findings on diagnostic imaging of heart and coronary circulation: Secondary | ICD-10-CM | POA: Diagnosis not present

## 2019-01-06 DIAGNOSIS — I10 Essential (primary) hypertension: Secondary | ICD-10-CM | POA: Diagnosis not present

## 2019-01-06 DIAGNOSIS — Z01812 Encounter for preprocedural laboratory examination: Secondary | ICD-10-CM | POA: Diagnosis not present

## 2019-01-06 DIAGNOSIS — Z20828 Contact with and (suspected) exposure to other viral communicable diseases: Secondary | ICD-10-CM | POA: Insufficient documentation

## 2019-01-06 DIAGNOSIS — E119 Type 2 diabetes mellitus without complications: Secondary | ICD-10-CM

## 2019-01-06 DIAGNOSIS — E782 Mixed hyperlipidemia: Secondary | ICD-10-CM

## 2019-01-06 DIAGNOSIS — I208 Other forms of angina pectoris: Secondary | ICD-10-CM

## 2019-01-06 DIAGNOSIS — I25119 Atherosclerotic heart disease of native coronary artery with unspecified angina pectoris: Secondary | ICD-10-CM | POA: Diagnosis not present

## 2019-01-06 LAB — SARS CORONAVIRUS 2 (TAT 6-24 HRS): SARS Coronavirus 2: NEGATIVE

## 2019-01-06 MED ORDER — SODIUM CHLORIDE 0.9% FLUSH: 3.0000 mL | Freq: Two times a day (BID) | INTRAVENOUS | Status: DC

## 2019-01-06 NOTE — H&P (View-Only) (Signed)
Cardiology Office Note  Date: 01/06/2019   ID: Mary Strong, DOB October 08, 1960, MRN [id number]  PCP:  Mary Bis, Strong  Cardiologist:  Mary Lesches, Strong Electrophysiologist:  None   Chief Complaint  Patient presents with  . Follow-up testing    History of Present Illness: Mary Strong is a 58 y.o. female that I met recently in consultation for evaluation of chest pain.  Cardiac CT angiogram that showed significantly elevated calcium score of 2103, heavily calcified LAD and RCA with at least moderate associated atherosclerosis, less prominent in the circumflex.  Follow-up FFR demonstrated significant stenosis within the proximal LAD.  She presents to discuss cardiac catheterization.  She does not report any progressive symptoms since most recent evaluation.  She has been taking Lopressor regularly and her heart rate has come down some.  Otherwise remains on aspirin and Crestor.  I am requesting her most recent lipid panel from Dr. Quillian Strong.  We discussed the results of her cardiac CT angiogram, also risks and benefits of a diagnostic cardiac catheterization with potential revascularization.  She is in agreement to proceed.  Past Medical History:  Diagnosis Date  . Anxiety   . Arthritis   . Constipation   . Depression   . Essential hypertension   . Fatty liver   . GERD (gastroesophageal reflux disease)   . History of migraine   . History of sleep apnea    Currently not using CPAP  . Hypothyroidism   . Multiple sclerosis ([place])   . Neurogenic bladder   . Neuropathy   . Palpitations   . Restless leg syndrome   . Skin cancer    Basal cell cancer on forehead, strong family hx of adenocarcinoma  . Type 2 diabetes mellitus ([place])     Past Surgical History:  Procedure Laterality Date  . CESAREAN SECTION     x 2  . CHOLECYSTECTOMY    . ENDOMETRIAL ABLATION    . JOINT REPLACEMENT    . TONSILLECTOMY    . TOTAL HIP ARTHROPLASTY Right 12/04/2015  . TOTAL HIP  ARTHROPLASTY Right 12/04/2015   Procedure: TOTAL HIP ARTHROPLASTY ANTERIOR APPROACH;  Surgeon: Mary Killings, Strong;  Location: [facility];  Service: Orthopedics;  Laterality: Right;  . TOTAL HIP ARTHROPLASTY Left 01/24/2016   Procedure: TOTAL HIP ARTHROPLASTY ANTERIOR APPROACH;  Surgeon: Mary Killings, Strong;  Location: [facility];  Service: Orthopedics;  Laterality: Left;  anterior approach  . TUBAL LIGATION      Current Outpatient Medications  Medication Sig Dispense Refill  . aspirin 81 MG tablet Take 81 mg by mouth daily.    . diazepam (VALIUM) 5 MG tablet TAKE 1 TABLET BY MOUTH THREE TIMES DAILY AS NEEDED 90 tablet 5  . Dulaglutide (TRULICITY [place]) Inject into the skin.    . DULoxetine (CYMBALTA) 60 MG capsule Take 1 capsule (60 mg total) by mouth at bedtime. 30 capsule 11  . gabapentin (NEURONTIN) 600 MG tablet TAKE 1 TABLET BY MOUTH THREE TIMES DAILY (Patient taking differently: Takes bid) 90 tablet 5  . levothyroxine (SYNTHROID) 112 MCG tablet Take 112 mcg by mouth daily before breakfast.    . metFORMIN (GLUCOPHAGE) 500 MG tablet Take 1 tablet (500 mg total) by mouth 2 (two) times daily.    . metoprolol tartrate (LOPRESSOR) 25 MG tablet Take 1 tablet (25 mg total) by mouth 2 (two) times daily.    . mometasone (ELOCON) 0.1 % cream Apply 1 application topically daily as needed (dermatosis).     Marland Kitchen  pantoprazole (PROTONIX) 20 MG tablet Take 20 mg by mouth daily.    . rosuvastatin (CRESTOR) 5 MG tablet Take 5 mg by mouth daily. Takes once a week     No current facility-administered medications for this visit.    Allergies:  Shellfish allergy, Ciprofloxacin, Copaxone [glatiramer acetate], Penicillins, and Vancomycin   Social History: The patient  reports that she has never smoked. She has never used smokeless tobacco. She reports current alcohol use. She reports current drug use. Drug: Marijuana.   Family History: The patient's family history includes Arthritis/Rheumatoid in her mother; Multiple sclerosis in  her mother; Psoriasis in her father.   ROS:  Please see the history of present illness. Otherwise, complete review of systems is positive for none.  All other systems are reviewed and negative.   Physical Exam: VS:  BP (!) 140/98   Pulse 76   Ht _0  (1.6 m)   Wt 197 lb (89.4 kg)   SpO2 98%   BMI 34.90 kg/m , BMI Body mass index is 34.9 kg/m.  Wt Readings from Last 3 Encounters:  01/06/19 197 lb (89.4 kg)  12/26/18 198 lb 8 oz (90 kg)  12/26/18 204 lb (92.5 kg)    General: Patient appears comfortable at rest. HEENT: Conjunctiva and lids normal, wearing a mask. Neck: Supple, no elevated JVP or carotid bruits, no thyromegaly. Lungs: Clear to auscultation, nonlabored breathing at rest. Cardiac: Regular rate and rhythm, no S3 or significant systolic murmur, no pericardial rub. Abdomen: Soft, nontender, bowel sounds present. Extremities: No pitting edema, distal pulses 2+. Skin: Warm and dry. Musculoskeletal: No kyphosis. Neuropsychiatric: Alert and oriented x3, affect grossly appropriate.  ECG:  An ECG dated 12/21/2018 was personally reviewed today and demonstrated:  Sinus rhythm with incomplete right bundle branch block.  Recent Labwork: 12/26/2018: ALT 19; AST 21; BUN 12; Creat 0.90; Hemoglobin 12.4; Platelets 183; Potassium 4.1; Sodium 141  August 2020: Hemoglobin 12.8, platelets 215, BUN 10, creatinine 0.94, AST 23, ALT 21, potassium 4.0, troponin T less than 0.013  Other Studies Reviewed Today:  CT FFR 01/04/2019: FINDINGS: FFRct analysis was performed on the original cardiac CT angiogram dataset. Diagrammatic representation of the FFRct analysis is provided in a separate PDF document in PACS. This dictation was created using the PDF document and an interactive 3D model of the results. 3D model is not available in the EMR/PACS. Normal FFR range is >0.80.  1. Left Main: No significant stenosis.  FFRct 0.99.  2. LAD: Significant stenosis proximally. FFRct 0.99  proximal, <0.5 distal  3. LCX: No significant stenosis.  FFRct 0.98 proximal, 0.93 distal.  4. RCA: Unable to interpret due to artifact  IMPRESSION: 1.  FFRct demonstrates significant stenosis in the proximal LAD.  2.  RCA unable to interpret due to artifact.  3.  Recommend cardiac catheterization.  Coronary CTA 01/04/2019: IMPRESSION: 1. Coronary calcium score of 2103. This was 99th percentile for age and sex matched control.  2. Normal coronary origin with right dominance.  3. Heavily calcified LAD and RCA arteries. Coronary calcification can cause blooming artifact and overestimation of plaque severity. However, in both the RCA and LAD there are regions that demonstrate calcification across the entire lumen without evidence of contrast, concerning for high degree stenosis.  4.  Will send study for FFRct.  5.  Recommend high potency statin and aspirin.  6.  Consider cardiac catheterization.  Assessment and Plan:  1.  Coronary artery disease with exertional angina, recent coronary CTA shows multivessel  distribution atherosclerosis with heavy calcification of the LAD and RCA and significant proximal LAD stenosis by FFR.  We have discussed the risks and benefits of a diagnostic cardiac catheterization with eye toward PCI and she is in agreement to proceed.  Continue aspirin, Lopressor, and Crestor.  2.  Essential hypertension by history.  Most recently on Lopressor.  3.  Mixed hyperlipidemia on Crestor.  She states that she can only tolerate taking Crestor once a week, I am requesting her most recent lipid panel from Dr. Quillian Strong.  May need to consider different agent such as PCSK9 inhibitor.  4.  Type 2 diabetes mellitus on Trulicity with follow-up by Dr. Quillian Strong.  5.  Multiple sclerosis, follows regularly with neurology.  Medication Adjustments/Labs and Tests Ordered: Current medicines are reviewed at length with the patient today.  Concerns regarding medicines are  outlined above.   Tests Ordered: No orders of the defined types were placed in this encounter.   Medication Changes: No orders of the defined types were placed in this encounter.   Disposition:  Follow up 1 month after procedure.  Signed, Satira Sark, Strong, [facility] 01/06/2019 11:42 AM    [facility] [place], [place], [place] [address] Phone: [phone]; Fax: [phone]

## 2019-01-06 NOTE — Telephone Encounter (Signed)
The following instructions were reviewed with the patient and the patient verbalized understanding and had no questions at this time.   Pt contacted pre-catheterization scheduled at [facility] for: 9:00 am Verified arrival time and place: [facility]) at: 7:00 am   No solid food after midnight prior to cath, clear liquids until 5 AM day of procedure.  Contrast allergy: No  Verified no diabetes medications.  Patient instructed to hold all diabetic po medications the day of the procedure  AM meds can be  taken pre-cath with sip of water including: ASA 81 mg   Confirmed patient has responsible person to drive home post procedure and observe 24 hours after arriving home: Patient has a responsible party to drive them to and from the hospital.  Currently, due to Covid-19 pandemic, only one support person will be allowed with patient. Must be the same support person for that patient's entire stay, will be screened and required to wear a mask. They will be asked to wait in the waiting room for the duration of the patient's stay.  Patients are required to wear a mask when they enter the hospital.      COVID-19 Pre-Screening Questions:  . In the past 7 to 10 days have you had a cough,  shortness of breath, headache, congestion, fever (100 or greater) body aches, chills, sore throat, or sudden loss of taste or sense of smell? No . Have you been around anyone with known Covid 19? No . Have you been around anyone who is awaiting Covid 19 test results in the past 7 to 10 days? No . Have you been around anyone who has been exposed to Covid 19, or has mentioned symptoms of Covid 19 within the past 7 to 10 days? No  If you have any concerns/questions about symptoms patients report during screening (either on the phone or at threshold). Contact the provider seeing the patient or DOD for further guidance.  If neither are available contact a member of the leadership  team.

## 2019-01-06 NOTE — Telephone Encounter (Signed)
Pre-cert Verification for the following procedure    [facility] 01/10/19 DR [facility] @9AM 

## 2019-01-06 NOTE — Progress Notes (Signed)
Cardiology Office Note  Date: 01/06/2019   ID: Mary Strong, DOB October 08, 1960, MRN [id number]  PCP:  Caryl Bis, MD  Cardiologist:  Rozann Lesches, MD Electrophysiologist:  None   Chief Complaint  Patient presents with  . Follow-up testing    History of Present Illness: Mary Strong is a 58 y.o. female that I met recently in consultation for evaluation of chest pain.  Cardiac CT angiogram that showed significantly elevated calcium score of 2103, heavily calcified LAD and RCA with at least moderate associated atherosclerosis, less prominent in the circumflex.  Follow-up FFR demonstrated significant stenosis within the proximal LAD.  She presents to discuss cardiac catheterization.  She does not report any progressive symptoms since most recent evaluation.  She has been taking Lopressor regularly and her heart rate has come down some.  Otherwise remains on aspirin and Crestor.  I am requesting her most recent lipid panel from Dr. Quillian Quince.  We discussed the results of her cardiac CT angiogram, also risks and benefits of a diagnostic cardiac catheterization with potential revascularization.  She is in agreement to proceed.  Past Medical History:  Diagnosis Date  . Anxiety   . Arthritis   . Constipation   . Depression   . Essential hypertension   . Fatty liver   . GERD (gastroesophageal reflux disease)   . History of migraine   . History of sleep apnea    Currently not using CPAP  . Hypothyroidism   . Multiple sclerosis ([place])   . Neurogenic bladder   . Neuropathy   . Palpitations   . Restless leg syndrome   . Skin cancer    Basal cell cancer on forehead, strong family hx of adenocarcinoma  . Type 2 diabetes mellitus ([place])     Past Surgical History:  Procedure Laterality Date  . CESAREAN SECTION     x 2  . CHOLECYSTECTOMY    . ENDOMETRIAL ABLATION    . JOINT REPLACEMENT    . TONSILLECTOMY    . TOTAL HIP ARTHROPLASTY Right 12/04/2015  . TOTAL HIP  ARTHROPLASTY Right 12/04/2015   Procedure: TOTAL HIP ARTHROPLASTY ANTERIOR APPROACH;  Surgeon: Marybelle Killings, MD;  Location: [facility];  Service: Orthopedics;  Laterality: Right;  . TOTAL HIP ARTHROPLASTY Left 01/24/2016   Procedure: TOTAL HIP ARTHROPLASTY ANTERIOR APPROACH;  Surgeon: Marybelle Killings, MD;  Location: [facility];  Service: Orthopedics;  Laterality: Left;  anterior approach  . TUBAL LIGATION      Current Outpatient Medications  Medication Sig Dispense Refill  . aspirin 81 MG tablet Take 81 mg by mouth daily.    . diazepam (VALIUM) 5 MG tablet TAKE 1 TABLET BY MOUTH THREE TIMES DAILY AS NEEDED 90 tablet 5  . Dulaglutide (TRULICITY [place]) Inject into the skin.    . DULoxetine (CYMBALTA) 60 MG capsule Take 1 capsule (60 mg total) by mouth at bedtime. 30 capsule 11  . gabapentin (NEURONTIN) 600 MG tablet TAKE 1 TABLET BY MOUTH THREE TIMES DAILY (Patient taking differently: Takes bid) 90 tablet 5  . levothyroxine (SYNTHROID) 112 MCG tablet Take 112 mcg by mouth daily before breakfast.    . metFORMIN (GLUCOPHAGE) 500 MG tablet Take 1 tablet (500 mg total) by mouth 2 (two) times daily.    . metoprolol tartrate (LOPRESSOR) 25 MG tablet Take 1 tablet (25 mg total) by mouth 2 (two) times daily.    . mometasone (ELOCON) 0.1 % cream Apply 1 application topically daily as needed (dermatosis).     Marland Kitchen  pantoprazole (PROTONIX) 20 MG tablet Take 20 mg by mouth daily.    . rosuvastatin (CRESTOR) 5 MG tablet Take 5 mg by mouth daily. Takes once a week     No current facility-administered medications for this visit.    Allergies:  Shellfish allergy, Ciprofloxacin, Copaxone [glatiramer acetate], Penicillins, and Vancomycin   Social History: The patient  reports that she has never smoked. She has never used smokeless tobacco. She reports current alcohol use. She reports current drug use. Drug: Marijuana.   Family History: The patient's family history includes Arthritis/Rheumatoid in her mother; Multiple sclerosis in  her mother; Psoriasis in her father.   ROS:  Please see the history of present illness. Otherwise, complete review of systems is positive for none.  All other systems are reviewed and negative.   Physical Exam: VS:  BP (!) 140/98   Pulse 76   Ht _0  (1.6 m)   Wt 197 lb (89.4 kg)   SpO2 98%   BMI 34.90 kg/m , BMI Body mass index is 34.9 kg/m.  Wt Readings from Last 3 Encounters:  01/06/19 197 lb (89.4 kg)  12/26/18 198 lb 8 oz (90 kg)  12/26/18 204 lb (92.5 kg)    General: Patient appears comfortable at rest. HEENT: Conjunctiva and lids normal, wearing a mask. Neck: Supple, no elevated JVP or carotid bruits, no thyromegaly. Lungs: Clear to auscultation, nonlabored breathing at rest. Cardiac: Regular rate and rhythm, no S3 or significant systolic murmur, no pericardial rub. Abdomen: Soft, nontender, bowel sounds present. Extremities: No pitting edema, distal pulses 2+. Skin: Warm and dry. Musculoskeletal: No kyphosis. Neuropsychiatric: Alert and oriented x3, affect grossly appropriate.  ECG:  An ECG dated 12/21/2018 was personally reviewed today and demonstrated:  Sinus rhythm with incomplete right bundle branch block.  Recent Labwork: 12/26/2018: ALT 19; AST 21; BUN 12; Creat 0.90; Hemoglobin 12.4; Platelets 183; Potassium 4.1; Sodium 141  August 2020: Hemoglobin 12.8, platelets 215, BUN 10, creatinine 0.94, AST 23, ALT 21, potassium 4.0, troponin T less than 0.013  Other Studies Reviewed Today:  CT FFR 01/04/2019: FINDINGS: FFRct analysis was performed on the original cardiac CT angiogram dataset. Diagrammatic representation of the FFRct analysis is provided in a separate PDF document in PACS. This dictation was created using the PDF document and an interactive 3D model of the results. 3D model is not available in the EMR/PACS. Normal FFR range is >0.80.  1. Left Main: No significant stenosis.  FFRct 0.99.  2. LAD: Significant stenosis proximally. FFRct 0.99  proximal, <0.5 distal  3. LCX: No significant stenosis.  FFRct 0.98 proximal, 0.93 distal.  4. RCA: Unable to interpret due to artifact  IMPRESSION: 1.  FFRct demonstrates significant stenosis in the proximal LAD.  2.  RCA unable to interpret due to artifact.  3.  Recommend cardiac catheterization.  Coronary CTA 01/04/2019: IMPRESSION: 1. Coronary calcium score of 2103. This was 99th percentile for age and sex matched control.  2. Normal coronary origin with right dominance.  3. Heavily calcified LAD and RCA arteries. Coronary calcification can cause blooming artifact and overestimation of plaque severity. However, in both the RCA and LAD there are regions that demonstrate calcification across the entire lumen without evidence of contrast, concerning for high degree stenosis.  4.  Will send study for FFRct.  5.  Recommend high potency statin and aspirin.  6.  Consider cardiac catheterization.  Assessment and Plan:  1.  Coronary artery disease with exertional angina, recent coronary CTA shows multivessel  distribution atherosclerosis with heavy calcification of the LAD and RCA and significant proximal LAD stenosis by FFR.  We have discussed the risks and benefits of a diagnostic cardiac catheterization with eye toward PCI and she is in agreement to proceed.  Continue aspirin, Lopressor, and Crestor.  2.  Essential hypertension by history.  Most recently on Lopressor.  3.  Mixed hyperlipidemia on Crestor.  She states that she can only tolerate taking Crestor once a week, I am requesting her most recent lipid panel from Dr. Quillian Quince.  May need to consider different agent such as PCSK9 inhibitor.  4.  Type 2 diabetes mellitus on Trulicity with follow-up by Dr. Quillian Quince.  5.  Multiple sclerosis, follows regularly with neurology.  Medication Adjustments/Labs and Tests Ordered: Current medicines are reviewed at length with the patient today.  Concerns regarding medicines are  outlined above.   Tests Ordered: No orders of the defined types were placed in this encounter.   Medication Changes: No orders of the defined types were placed in this encounter.   Disposition:  Follow up 1 month after procedure.  Signed, Satira Sark, MD, [facility] 01/06/2019 11:42 AM    [facility] [place], [place], [place] [address] Phone: [phone]; Fax: [phone]

## 2019-01-06 NOTE — Patient Instructions (Signed)
Your physician recommends that you schedule a follow-up appointment in: [place]  Your physician recommends that you continue on your current medications as directed. Please refer to the Current Medication list given to you today.  Your physician has requested that you have a cardiac catheterization. Cardiac catheterization is used to diagnose and/or treat various heart conditions. Doctors may recommend this procedure for a number of different reasons. The most common reason is to evaluate chest pain. Chest pain can be a symptom of coronary artery disease (CAD), and cardiac catheterization can show whether plaque is narrowing or blocking your heart's arteries. This procedure is also used to evaluate the valves, as well as measure the blood flow and oxygen levels in different parts of your heart. For further information please visit [email or web address]. Please follow instruction sheet, as given.  Thank you for choosing [facility]!!

## 2019-01-06 NOTE — H&P (View-Only) (Signed)
Cardiology Office Note  Date: 01/06/2019   ID: [name], DOB October 08, 1960, MRN [id number]  PCP:  Caryl Bis, MD  Cardiologist:  Rozann Lesches, MD Electrophysiologist:  None   Chief Complaint  Patient presents with  . Follow-up testing    History of Present Illness: Mary Strong is a 58 y.o. female that I met recently in consultation for evaluation of chest pain.  Cardiac CT angiogram that showed significantly elevated calcium score of 2103, heavily calcified LAD and RCA with at least moderate associated atherosclerosis, less prominent in the circumflex.  Follow-up FFR demonstrated significant stenosis within the proximal LAD.  She presents to discuss cardiac catheterization.  She does not report any progressive symptoms since most recent evaluation.  She has been taking Lopressor regularly and her heart rate has come down some.  Otherwise remains on aspirin and Crestor.  I am requesting her most recent lipid panel from Dr. Quillian Quince.  We discussed the results of her cardiac CT angiogram, also risks and benefits of a diagnostic cardiac catheterization with potential revascularization.  She is in agreement to proceed.  Past Medical History:  Diagnosis Date  . Anxiety   . Arthritis   . Constipation   . Depression   . Essential hypertension   . Fatty liver   . GERD (gastroesophageal reflux disease)   . History of migraine   . History of sleep apnea    Currently not using CPAP  . Hypothyroidism   . Multiple sclerosis ([place])   . Neurogenic bladder   . Neuropathy   . Palpitations   . Restless leg syndrome   . Skin cancer    Basal cell cancer on forehead, strong family hx of adenocarcinoma  . Type 2 diabetes mellitus ([place])     Past Surgical History:  Procedure Laterality Date  . CESAREAN SECTION     x 2  . CHOLECYSTECTOMY    . ENDOMETRIAL ABLATION    . JOINT REPLACEMENT    . TONSILLECTOMY    . TOTAL HIP ARTHROPLASTY Right 12/04/2015  . TOTAL HIP  ARTHROPLASTY Right 12/04/2015   Procedure: TOTAL HIP ARTHROPLASTY ANTERIOR APPROACH;  Surgeon: Marybelle Killings, MD;  Location: [facility];  Service: Orthopedics;  Laterality: Right;  . TOTAL HIP ARTHROPLASTY Left 01/24/2016   Procedure: TOTAL HIP ARTHROPLASTY ANTERIOR APPROACH;  Surgeon: Marybelle Killings, MD;  Location: [facility];  Service: Orthopedics;  Laterality: Left;  anterior approach  . TUBAL LIGATION      Current Outpatient Medications  Medication Sig Dispense Refill  . aspirin 81 MG tablet Take 81 mg by mouth daily.    . diazepam (VALIUM) 5 MG tablet TAKE 1 TABLET BY MOUTH THREE TIMES DAILY AS NEEDED 90 tablet 5  . Dulaglutide (TRULICITY [place]) Inject into the skin.    . DULoxetine (CYMBALTA) 60 MG capsule Take 1 capsule (60 mg total) by mouth at bedtime. 30 capsule 11  . gabapentin (NEURONTIN) 600 MG tablet TAKE 1 TABLET BY MOUTH THREE TIMES DAILY (Patient taking differently: Takes bid) 90 tablet 5  . levothyroxine (SYNTHROID) 112 MCG tablet Take 112 mcg by mouth daily before breakfast.    . metFORMIN (GLUCOPHAGE) 500 MG tablet Take 1 tablet (500 mg total) by mouth 2 (two) times daily.    . metoprolol tartrate (LOPRESSOR) 25 MG tablet Take 1 tablet (25 mg total) by mouth 2 (two) times daily.    . mometasone (ELOCON) 0.1 % cream Apply 1 application topically daily as needed (dermatosis).     Marland Kitchen  pantoprazole (PROTONIX) 20 MG tablet Take 20 mg by mouth daily.    . rosuvastatin (CRESTOR) 5 MG tablet Take 5 mg by mouth daily. Takes once a week     No current facility-administered medications for this visit.    Allergies:  Shellfish allergy, Ciprofloxacin, Copaxone [glatiramer acetate], Penicillins, and Vancomycin   Social History: The patient  reports that she has never smoked. She has never used smokeless tobacco. She reports current alcohol use. She reports current drug use. Drug: Marijuana.   Family History: The patient's family history includes Arthritis/Rheumatoid in her mother; Multiple sclerosis in  her mother; Psoriasis in her father.   ROS:  Please see the history of present illness. Otherwise, complete review of systems is positive for none.  All other systems are reviewed and negative.   Physical Exam: VS:  BP (!) 140/98   Pulse 76   Ht _0  (1.6 m)   Wt 197 lb (89.4 kg)   SpO2 98%   BMI 34.90 kg/m , BMI Body mass index is 34.9 kg/m.  Wt Readings from Last 3 Encounters:  01/06/19 197 lb (89.4 kg)  12/26/18 198 lb 8 oz (90 kg)  12/26/18 204 lb (92.5 kg)    General: Patient appears comfortable at rest. HEENT: Conjunctiva and lids normal, wearing a mask. Neck: Supple, no elevated JVP or carotid bruits, no thyromegaly. Lungs: Clear to auscultation, nonlabored breathing at rest. Cardiac: Regular rate and rhythm, no S3 or significant systolic murmur, no pericardial rub. Abdomen: Soft, nontender, bowel sounds present. Extremities: No pitting edema, distal pulses 2+. Skin: Warm and dry. Musculoskeletal: No kyphosis. Neuropsychiatric: Alert and oriented x3, affect grossly appropriate.  ECG:  An ECG dated 12/21/2018 was personally reviewed today and demonstrated:  Sinus rhythm with incomplete right bundle branch block.  Recent Labwork: 12/26/2018: ALT 19; AST 21; BUN 12; Creat 0.90; Hemoglobin 12.4; Platelets 183; Potassium 4.1; Sodium 141  August 2020: Hemoglobin 12.8, platelets 215, BUN 10, creatinine 0.94, AST 23, ALT 21, potassium 4.0, troponin T less than 0.013  Other Studies Reviewed Today:  CT FFR 01/04/2019: FINDINGS: FFRct analysis was performed on the original cardiac CT angiogram dataset. Diagrammatic representation of the FFRct analysis is provided in a separate PDF document in PACS. This dictation was created using the PDF document and an interactive 3D model of the results. 3D model is not available in the EMR/PACS. Normal FFR range is >0.80.  1. Left Main: No significant stenosis.  FFRct 0.99.  2. LAD: Significant stenosis proximally. FFRct 0.99  proximal, <0.5 distal  3. LCX: No significant stenosis.  FFRct 0.98 proximal, 0.93 distal.  4. RCA: Unable to interpret due to artifact  IMPRESSION: 1.  FFRct demonstrates significant stenosis in the proximal LAD.  2.  RCA unable to interpret due to artifact.  3.  Recommend cardiac catheterization.  Coronary CTA 01/04/2019: IMPRESSION: 1. Coronary calcium score of 2103. This was 99th percentile for age and sex matched control.  2. Normal coronary origin with right dominance.  3. Heavily calcified LAD and RCA arteries. Coronary calcification can cause blooming artifact and overestimation of plaque severity. However, in both the RCA and LAD there are regions that demonstrate calcification across the entire lumen without evidence of contrast, concerning for high degree stenosis.  4.  Will send study for FFRct.  5.  Recommend high potency statin and aspirin.  6.  Consider cardiac catheterization.  Assessment and Plan:  1.  Coronary artery disease with exertional angina, recent coronary CTA shows multivessel  distribution atherosclerosis with heavy calcification of the LAD and RCA and significant proximal LAD stenosis by FFR.  We have discussed the risks and benefits of a diagnostic cardiac catheterization with eye toward PCI and she is in agreement to proceed.  Continue aspirin, Lopressor, and Crestor.  2.  Essential hypertension by history.  Most recently on Lopressor.  3.  Mixed hyperlipidemia on Crestor.  She states that she can only tolerate taking Crestor once a week, I am requesting her most recent lipid panel from Dr. Quillian Quince.  May need to consider different agent such as PCSK9 inhibitor.  4.  Type 2 diabetes mellitus on Trulicity with follow-up by Dr. Quillian Quince.  5.  Multiple sclerosis, follows regularly with neurology.  Medication Adjustments/Labs and Tests Ordered: Current medicines are reviewed at length with the patient today.  Concerns regarding medicines are  outlined above.   Tests Ordered: No orders of the defined types were placed in this encounter.   Medication Changes: No orders of the defined types were placed in this encounter.   Disposition:  Follow up 1 month after procedure.  Signed, Satira Sark, MD, [facility] 01/06/2019 11:42 AM    [facility] [place], [place], [place] [address] Phone: [phone]; Fax: [phone]

## 2019-01-09 DIAGNOSIS — I2510 Atherosclerotic heart disease of native coronary artery without angina pectoris: Secondary | ICD-10-CM

## 2019-01-09 NOTE — H&P (Addendum)
Atypical angina DM II; MS; Disabled Heart Flow FFR CT analysis demonstrated severe calcified disease in the mid LAD, likely needing LAD OA and stent assuming RCA okay Stent Length predicted is 32 mm

## 2019-01-10 ENCOUNTER — Encounter (HOSPITAL_COMMUNITY)
Admission: RE | Disposition: A | Discharge status: Home / Self Care | Payer: Self-pay | Source: Home / Self Care | Attending: Interventional Cardiology

## 2019-01-10 ENCOUNTER — Other Ambulatory Visit: Payer: Self-pay

## 2019-01-10 ENCOUNTER — Ambulatory Visit (HOSPITAL_COMMUNITY)
Admission: RE | Admit: 2019-01-10 | Discharge: 2019-01-10 | Disposition: A | Discharge status: Home / Self Care | Payer: Medicare Other | Attending: Interventional Cardiology | Admitting: Interventional Cardiology

## 2019-01-10 ENCOUNTER — Encounter (HOSPITAL_COMMUNITY): Payer: Self-pay | Admitting: Interventional Cardiology

## 2019-01-10 DIAGNOSIS — Z7984 Long term (current) use of oral hypoglycemic drugs: Secondary | ICD-10-CM | POA: Insufficient documentation

## 2019-01-10 DIAGNOSIS — Z20828 Contact with and (suspected) exposure to other viral communicable diseases: Secondary | ICD-10-CM | POA: Insufficient documentation

## 2019-01-10 DIAGNOSIS — Z881 Allergy status to other antibiotic agents status: Secondary | ICD-10-CM | POA: Insufficient documentation

## 2019-01-10 DIAGNOSIS — I2584 Coronary atherosclerosis due to calcified coronary lesion: Secondary | ICD-10-CM | POA: Diagnosis not present

## 2019-01-10 DIAGNOSIS — I25118 Atherosclerotic heart disease of native coronary artery with other forms of angina pectoris: Secondary | ICD-10-CM | POA: Insufficient documentation

## 2019-01-10 DIAGNOSIS — G2581 Restless legs syndrome: Secondary | ICD-10-CM | POA: Diagnosis not present

## 2019-01-10 DIAGNOSIS — Z85828 Personal history of other malignant neoplasm of skin: Secondary | ICD-10-CM | POA: Insufficient documentation

## 2019-01-10 DIAGNOSIS — E782 Mixed hyperlipidemia: Secondary | ICD-10-CM | POA: Diagnosis not present

## 2019-01-10 DIAGNOSIS — K760 Fatty (change of) liver, not elsewhere classified: Secondary | ICD-10-CM | POA: Diagnosis not present

## 2019-01-10 DIAGNOSIS — E1140 Type 2 diabetes mellitus with diabetic neuropathy, unspecified: Secondary | ICD-10-CM | POA: Diagnosis not present

## 2019-01-10 DIAGNOSIS — I10 Essential (primary) hypertension: Secondary | ICD-10-CM | POA: Diagnosis not present

## 2019-01-10 DIAGNOSIS — E039 Hypothyroidism, unspecified: Secondary | ICD-10-CM | POA: Diagnosis not present

## 2019-01-10 DIAGNOSIS — G35 Multiple sclerosis: Secondary | ICD-10-CM | POA: Diagnosis present

## 2019-01-10 DIAGNOSIS — K219 Gastro-esophageal reflux disease without esophagitis: Secondary | ICD-10-CM | POA: Diagnosis not present

## 2019-01-10 DIAGNOSIS — G4730 Sleep apnea, unspecified: Secondary | ICD-10-CM | POA: Insufficient documentation

## 2019-01-10 DIAGNOSIS — M1990 Unspecified osteoarthritis, unspecified site: Secondary | ICD-10-CM | POA: Diagnosis not present

## 2019-01-10 DIAGNOSIS — Z7951 Long term (current) use of inhaled steroids: Secondary | ICD-10-CM | POA: Insufficient documentation

## 2019-01-10 DIAGNOSIS — F419 Anxiety disorder, unspecified: Secondary | ICD-10-CM | POA: Insufficient documentation

## 2019-01-10 DIAGNOSIS — Z880 Allergy status to penicillin: Secondary | ICD-10-CM | POA: Insufficient documentation

## 2019-01-10 DIAGNOSIS — R931 Abnormal findings on diagnostic imaging of heart and coronary circulation: Secondary | ICD-10-CM

## 2019-01-10 DIAGNOSIS — Z7982 Long term (current) use of aspirin: Secondary | ICD-10-CM | POA: Insufficient documentation

## 2019-01-10 DIAGNOSIS — F329 Major depressive disorder, single episode, unspecified: Secondary | ICD-10-CM | POA: Insufficient documentation

## 2019-01-10 DIAGNOSIS — Z79899 Other long term (current) drug therapy: Secondary | ICD-10-CM

## 2019-01-10 DIAGNOSIS — I2510 Atherosclerotic heart disease of native coronary artery without angina pectoris: Secondary | ICD-10-CM

## 2019-01-10 HISTORY — PX: LEFT HEART CATH AND CORONARY ANGIOGRAPHY: CATH118249

## 2019-01-10 LAB — GLUCOSE, CAPILLARY: Glucose-Capillary: 133 mg/dL — ABNORMAL HIGH (ref 70–99)

## 2019-01-10 SURGERY — LEFT HEART CATH AND CORONARY ANGIOGRAPHY
Anesthesia: LOCAL

## 2019-01-10 MED ORDER — HEPARIN SODIUM (PORCINE) 1000 UNIT/ML IJ SOLN
INTRAMUSCULAR | Status: AC
  Filled 2019-01-10: qty 1

## 2019-01-10 MED ORDER — ACETAMINOPHEN 325 MG PO TABS: 650.0000 mg | ORAL_TABLET | ORAL | Status: DC | PRN

## 2019-01-10 MED ORDER — ROSUVASTATIN CALCIUM 5 MG PO TABS: 5.0000 mg | ORAL_TABLET | Freq: Every morning | ORAL | 11 refills | Status: DC

## 2019-01-10 MED ORDER — LABETALOL HCL 5 MG/ML IV SOLN: 10.0000 mg | INTRAVENOUS | Status: DC | PRN

## 2019-01-10 MED ORDER — SODIUM CHLORIDE 0.9% FLUSH: 3.0000 mL | INTRAVENOUS | Status: DC | PRN

## 2019-01-10 MED ORDER — SODIUM CHLORIDE 0.9 % IV SOLN: 250.0000 mL | INTRAVENOUS | Status: DC | PRN

## 2019-01-10 MED ORDER — HEPARIN (PORCINE) IN NACL 1000-0.9 UT/500ML-% IV SOLN
INTRAVENOUS | Status: DC | PRN
  Administered 2019-01-10: 500 mL

## 2019-01-10 MED ORDER — SODIUM CHLORIDE 0.9 % IV SOLN: INTRAVENOUS | Status: DC

## 2019-01-10 MED ORDER — LIDOCAINE HCL (PF) 1 % IJ SOLN
INTRAMUSCULAR | Status: AC
  Filled 2019-01-10: qty 30

## 2019-01-10 MED ORDER — SODIUM CHLORIDE 0.9 % WEIGHT BASED INFUSION
1.0000 mL/kg/h | INTRAVENOUS | Status: DC
  Administered 2019-01-10: 09:00:00 300 mL via INTRAVENOUS

## 2019-01-10 MED ORDER — ASPIRIN 81 MG PO CHEW: 81.0000 mg | CHEWABLE_TABLET | Freq: Every day | ORAL | Status: DC

## 2019-01-10 MED ORDER — VERAPAMIL HCL 2.5 MG/ML IV SOLN
INTRAVENOUS | Status: DC | PRN
  Administered 2019-01-10: 09:00:00 10 mL via INTRA_ARTERIAL

## 2019-01-10 MED ORDER — ISOSORBIDE MONONITRATE ER 30 MG PO TB24: 30.0000 mg | ORAL_TABLET | Freq: Every day | ORAL | 11 refills | Status: DC

## 2019-01-10 MED ORDER — MIDAZOLAM HCL 2 MG/2ML IJ SOLN
INTRAMUSCULAR | Status: DC | PRN
  Administered 2019-01-10: 1 mg via INTRAVENOUS

## 2019-01-10 MED ORDER — FENTANYL CITRATE (PF) 100 MCG/2ML IJ SOLN
INTRAMUSCULAR | Status: DC | PRN
  Administered 2019-01-10: 25 ug via INTRAVENOUS

## 2019-01-10 MED ORDER — IOHEXOL 350 MG/ML SOLN
INTRAVENOUS | Status: DC | PRN
  Administered 2019-01-10: 09:00:00 95 mL

## 2019-01-10 MED ORDER — SODIUM CHLORIDE 0.9% FLUSH: 3.0000 mL | Freq: Two times a day (BID) | INTRAVENOUS | Status: DC

## 2019-01-10 MED ORDER — MIDAZOLAM HCL 2 MG/2ML IJ SOLN
INTRAMUSCULAR | Status: AC
  Filled 2019-01-10: qty 2

## 2019-01-10 MED ORDER — VERAPAMIL HCL 2.5 MG/ML IV SOLN
INTRAVENOUS | Status: AC
  Filled 2019-01-10: qty 2

## 2019-01-10 MED ORDER — NITROGLYCERIN 1 MG/10 ML FOR IR/CATH LAB
INTRA_ARTERIAL | Status: AC
  Filled 2019-01-10: qty 10

## 2019-01-10 MED ORDER — NITROGLYCERIN 1 MG/10 ML FOR IR/CATH LAB
INTRA_ARTERIAL | Status: DC | PRN
  Administered 2019-01-10: 200 ug via INTRACORONARY

## 2019-01-10 MED ORDER — OXYCODONE HCL 5 MG PO TABS: 5.0000 mg | ORAL_TABLET | ORAL | Status: DC | PRN

## 2019-01-10 MED ORDER — CLOPIDOGREL BISULFATE 75 MG PO TABS: 75.0000 mg | ORAL_TABLET | Freq: Every day | ORAL | 11 refills | Status: DC

## 2019-01-10 MED ORDER — ONDANSETRON HCL 4 MG/2ML IJ SOLN: 4.0000 mg | Freq: Four times a day (QID) | INTRAMUSCULAR | Status: DC | PRN

## 2019-01-10 MED ORDER — NITROGLYCERIN 0.4 MG SL SUBL: 0.4000 mg | SUBLINGUAL_TABLET | SUBLINGUAL | 2 refills | Status: DC | PRN

## 2019-01-10 MED ORDER — ASPIRIN 81 MG PO CHEW: 81.0000 mg | CHEWABLE_TABLET | ORAL | Status: DC

## 2019-01-10 MED ORDER — LIDOCAINE HCL (PF) 1 % IJ SOLN
INTRAMUSCULAR | Status: DC | PRN
  Administered 2019-01-10: 2 mL

## 2019-01-10 MED ORDER — HEPARIN (PORCINE) IN NACL 1000-0.9 UT/500ML-% IV SOLN
INTRAVENOUS | Status: AC
  Filled 2019-01-10: qty 1000

## 2019-01-10 MED ORDER — HEPARIN SODIUM (PORCINE) 1000 UNIT/ML IJ SOLN
INTRAMUSCULAR | Status: DC | PRN
  Administered 2019-01-10: 4500 [IU] via INTRAVENOUS

## 2019-01-10 MED ORDER — SODIUM CHLORIDE 0.9 % WEIGHT BASED INFUSION
3.0000 mL/kg/h | INTRAVENOUS | Status: AC
  Administered 2019-01-10: 3 mL/kg/h via INTRAVENOUS

## 2019-01-10 MED ORDER — NITROGLYCERIN 0.4 MG SL SUBL: 0.4000 mg | SUBLINGUAL_TABLET | SUBLINGUAL | Status: DC | PRN

## 2019-01-10 MED ORDER — FENTANYL CITRATE (PF) 100 MCG/2ML IJ SOLN
INTRAMUSCULAR | Status: AC
  Filled 2019-01-10: qty 2

## 2019-01-10 MED ORDER — HYDRALAZINE HCL 20 MG/ML IJ SOLN: 10.0000 mg | INTRAMUSCULAR | Status: DC | PRN

## 2019-01-10 SURGICAL SUPPLY — 11 items
CATH 5FR JL3.5 JR4 ANG PIG MP (CATHETERS) ×1 IMPLANT
DEVICE RAD COMP TR BAND LRG (VASCULAR PRODUCTS) ×1 IMPLANT
ELECT DEFIB PAD ADLT CADENCE (PAD) ×1 IMPLANT
GLIDESHEATH SLEND A-KIT 6F 22G (SHEATH) ×1 IMPLANT
GUIDEWIRE INQWIRE 1.5J.035X260 (WIRE) IMPLANT
INQWIRE 1.5J .035X260CM (WIRE) ×2
KIT HEART LEFT (KITS) ×2 IMPLANT
PACK CARDIAC CATHETERIZATION (CUSTOM PROCEDURE TRAY) ×2 IMPLANT
SHEATH PROBE COVER 6X72 (BAG) ×1 IMPLANT
TRANSDUCER W/STOPCOCK (MISCELLANEOUS) ×2 IMPLANT
TUBING CIL FLEX 10 FLL-RA (TUBING) ×2 IMPLANT

## 2019-01-10 NOTE — Discharge Instructions (Signed)
Radial Site Care ° °This sheet gives you information about how to care for yourself after your procedure. Your health care provider may also give you more specific instructions. If you have problems or questions, contact your health care provider. °What can I expect after the procedure? °After the procedure, it is common to have: °· Bruising and tenderness at the catheter insertion area. °Follow these instructions at home: °Medicines °· Take over-the-counter and prescription medicines only as told by your health care provider. °Insertion site care °· Follow instructions from your health care provider about how to take care of your insertion site. Make sure you: °? Wash your hands with soap and water before you change your bandage (dressing). If soap and water are not available, use hand sanitizer. °? Change your dressing as told by your health care provider. °? Leave stitches (sutures), skin glue, or adhesive strips in place. These skin closures may need to stay in place for 2 weeks or longer. If adhesive strip edges start to loosen and curl up, you may trim the loose edges. Do not remove adhesive strips completely unless your health care provider tells you to do that. °· Check your insertion site every day for signs of infection. Check for: °? Redness, swelling, or pain. °? Fluid or blood. °? Pus or a bad smell. °? Warmth. °· Do not take baths, swim, or use a hot tub until your health care provider approves. °· You may shower 24-48 hours after the procedure, or as directed by your health care provider. °? Remove the dressing and gently wash the site with plain soap and water. °? Pat the area dry with a clean towel. °? Do not rub the site. That could cause bleeding. °· Do not apply powder or lotion to the site. °Activity ° °· For 24 hours after the procedure, or as directed by your health care provider: °? Do not flex or bend the affected arm. °? Do not push or pull heavy objects with the affected arm. °? Do not  drive yourself home from the hospital or clinic. You may drive 24 hours after the procedure unless your health care provider tells you not to. °? Do not operate machinery or power tools. °· Do not lift anything that is heavier than 10 lb (4.5 kg), or the limit that you are told, until your health care provider says that it is safe. °· Ask your health care provider when it is okay to: °? Return to work or school. °? Resume usual physical activities or sports. °? Resume sexual activity. °General instructions °· If the catheter site starts to bleed, raise your arm and put firm pressure on the site. If the bleeding does not stop, get help right away. This is a medical emergency. °· If you went home on the same day as your procedure, a responsible adult should be with you for the first 24 hours after you arrive home. °· Keep all follow-up visits as told by your health care provider. This is important. °Contact a health care provider if: °· You have a fever. °· You have redness, swelling, or yellow drainage around your insertion site. °Get help right away if: °· You have unusual pain at the radial site. °· The catheter insertion area swells very fast. °· The insertion area is bleeding, and the bleeding does not stop when you hold steady pressure on the area. °· Your arm or hand becomes pale, cool, tingly, or numb. °These symptoms may represent a serious problem   that is an emergency. Do not wait to see if the symptoms will go away. Get medical help right away. Call your local emergency services (911 in the [place] Do not drive yourself to the hospital. °Summary °· After the procedure, it is common to have bruising and tenderness at the site. °· Follow instructions from your health care provider about how to take care of your radial site wound. Check the wound every day for signs of infection. °· Do not lift anything that is heavier than 10 lb (4.5 kg), or the limit that you are told, until your health care provider says  that it is safe. °This information is not intended to replace advice given to you by your health care provider. Make sure you discuss any questions you have with your health care provider. °Document Released: 05/23/2010 Document Revised: 05/26/2017 Document Reviewed: 05/26/2017 °Elsevier Patient Education © 2020 [employer] ° °

## 2019-01-10 NOTE — CV Procedure (Signed)
   Diagnostic coronary angiography via right radial using real-time vascular ultrasound for access.  Heavy three-vessel calcification  75% mid LAD previously shown to be significant by FFR CT imaging.  Total occlusion of dominant mid RCA.  Right coronary fills late by collaterals from the left coronary.  Circumflex is widely patent.  Normal LV function and LVEDP.  Given diabetes and severe risk factor alteration, would consider all arterial revascularization with LIMA to LAD and potential radial or RIMA to RCA.  Alternative approach could be CTO PCI of RCA followed by orbital atherectomy of LAD and stenting.

## 2019-01-10 NOTE — Interval H&P Note (Signed)
Cath Lab Visit (complete for each Cath Lab visit)  Clinical Evaluation Leading to the Procedure:   ACS: No.  Non-ACS:    Anginal Classification: CCS II  Anti-ischemic medical therapy: Minimal Therapy (1 class of medications)  Non-Invasive Test Results: High-risk stress test findings: cardiac mortality >3%/year  Prior CABG: No previous CABG      History and Physical Interval Note:  01/10/2019 8:37 AM  Mary Strong  has presented today for surgery, with the diagnosis of CAD.  The various methods of treatment have been discussed with the patient and family. After consideration of risks, benefits and other options for treatment, the patient has consented to  Procedure(s): LEFT HEART CATH AND CORONARY ANGIOGRAPHY (N/A) as a surgical intervention.  The patient's history has been reviewed, patient examined, no change in status, stable for surgery.  I have reviewed the patient's chart and labs.  Questions were answered to the patient's satisfaction.     Belva Crome III

## 2019-01-11 ENCOUNTER — Telehealth: Payer: Self-pay | Admitting: *Deleted

## 2019-01-11 NOTE — Telephone Encounter (Signed)
-----   Message from Satira Sark, MD sent at 01/09/2019  7:31 AM EDT ----- Results reviewed.  SARS coronavirus 2 test is negative.

## 2019-01-11 NOTE — Telephone Encounter (Signed)
Pt made aware at cath admission

## 2019-01-13 NOTE — CV Procedure (Signed)
   Left heart cath via right radial with performance of coronary angiography using real-time vascular ultrasound for vascular access.  No significant obstructive disease identified.  LAD contains less than 50% proximal to mid narrowing and circumflex contains eccentric 50% narrowing.  RCA has luminal irregularities.  Normal LV function.  Mild to moderate coronary calcification.  Recommend: Aggressive preventive therapy.  Resume Xarelto this evening.

## 2019-01-16 ENCOUNTER — Other Ambulatory Visit: Payer: Self-pay

## 2019-01-16 ENCOUNTER — Other Ambulatory Visit (HOSPITAL_COMMUNITY)
Admission: RE | Admit: 2019-01-16 | Discharge: 2019-01-16 | Disposition: A | Discharge status: Home / Self Care | Payer: Medicare Other | Source: Home / Self Care | Attending: Cardiology | Admitting: Cardiology

## 2019-01-16 DIAGNOSIS — K219 Gastro-esophageal reflux disease without esophagitis: Secondary | ICD-10-CM | POA: Diagnosis not present

## 2019-01-16 DIAGNOSIS — I10 Essential (primary) hypertension: Secondary | ICD-10-CM | POA: Diagnosis not present

## 2019-01-16 DIAGNOSIS — I2584 Coronary atherosclerosis due to calcified coronary lesion: Secondary | ICD-10-CM | POA: Diagnosis not present

## 2019-01-16 DIAGNOSIS — Z20828 Contact with and (suspected) exposure to other viral communicable diseases: Secondary | ICD-10-CM | POA: Diagnosis not present

## 2019-01-16 DIAGNOSIS — G35 Multiple sclerosis: Secondary | ICD-10-CM | POA: Diagnosis not present

## 2019-01-16 DIAGNOSIS — I25118 Atherosclerotic heart disease of native coronary artery with other forms of angina pectoris: Secondary | ICD-10-CM | POA: Diagnosis not present

## 2019-01-16 LAB — SARS CORONAVIRUS 2 (TAT 6-24 HRS): SARS Coronavirus 2: NEGATIVE

## 2019-01-17 ENCOUNTER — Telehealth: Payer: Self-pay | Admitting: *Deleted

## 2019-01-17 NOTE — Telephone Encounter (Signed)
Left voicemail on patient's cell phone per DPR to return call to review the following instructions.   Pt contacted pre-catheterization scheduled at [facility] for: CTO intervention with Dr. Martinique. Verified arrival time and place: [facility]) at: 9:30 am.   No solid food after midnight prior to cath, clear liquids until 5 AM day of procedure. Contrast allergy: None  Diabetes medication: Metformin twice daily and trulicity once a week. Patient will be advised to not take metformin the day of the catheterization and to hold this medication for 48 hours after the procedure.   AM meds can be  taken pre-cath with sip of water including: ASA 81 mg Plavix 75 mg   Confirmed patient has responsible person to drive home post procedure and observe 24 hours after arriving home:   Currently, due to Covid-19 pandemic, only one support person will be allowed with patient. Must be the same support person for that patient's entire stay, will be screened and required to wear a mask. They will be asked to wait in the waiting room for the duration of the patient's stay.  Patients are required to wear a mask when they enter the hospital.      COVID-19 Pre-Screening Questions:  . In the past 7 to 10 days have you had a cough,  shortness of breath, headache, congestion, fever (100 or greater) body aches, chills, sore throat, or sudden loss of taste or sense of smell? . Have you been around anyone with known Covid 19. . Have you been around anyone who is awaiting Covid 19 test results in the past 7 to 10 days? . Have you been around anyone who has been exposed to Covid 19, or has mentioned symptoms of Covid 19 within the past 7 to 10 days?

## 2019-01-17 NOTE — Telephone Encounter (Signed)
Patient returned call. Reviewed all pre-procedure instructions with patient who verbalized understanding and is agreeable to plan.   Confirmed patient has responsible person to drive home post procedure and observe 24 hours after arriving home: yes.        COVID-19 Pre-Screening Questions:  . In the past 7 to 10 days have you had a cough,  shortness of breath, headache, congestion, fever (100 or greater) body aches, chills, sore throat, or sudden loss of taste or sense of smell? No . Have you been around anyone with known Covid 19? No . Have you been around anyone who is awaiting Covid 19 test results in the past 7 to 10 days? No . Have you been around anyone who has been exposed to Covid 19, or has mentioned symptoms of Covid 19 within the past 7 to 10 days? No

## 2019-01-18 ENCOUNTER — Ambulatory Visit (HOSPITAL_COMMUNITY)
Admission: RE | Disposition: A | Discharge status: Home / Self Care | Payer: Self-pay | Source: Home / Self Care | Attending: Cardiology

## 2019-01-18 ENCOUNTER — Ambulatory Visit (HOSPITAL_COMMUNITY)
Admission: RE | Admit: 2019-01-18 | Discharge: 2019-01-19 | Disposition: A | Discharge status: Home / Self Care | Payer: Medicare Other | Attending: Cardiology | Admitting: Cardiology

## 2019-01-18 ENCOUNTER — Other Ambulatory Visit: Payer: Self-pay

## 2019-01-18 DIAGNOSIS — M1990 Unspecified osteoarthritis, unspecified site: Secondary | ICD-10-CM | POA: Insufficient documentation

## 2019-01-18 DIAGNOSIS — Z7982 Long term (current) use of aspirin: Secondary | ICD-10-CM | POA: Diagnosis not present

## 2019-01-18 DIAGNOSIS — I25119 Atherosclerotic heart disease of native coronary artery with unspecified angina pectoris: Secondary | ICD-10-CM | POA: Diagnosis not present

## 2019-01-18 DIAGNOSIS — G2581 Restless legs syndrome: Secondary | ICD-10-CM | POA: Insufficient documentation

## 2019-01-18 DIAGNOSIS — Z7984 Long term (current) use of oral hypoglycemic drugs: Secondary | ICD-10-CM | POA: Insufficient documentation

## 2019-01-18 DIAGNOSIS — F419 Anxiety disorder, unspecified: Secondary | ICD-10-CM | POA: Diagnosis not present

## 2019-01-18 DIAGNOSIS — G4733 Obstructive sleep apnea (adult) (pediatric): Secondary | ICD-10-CM | POA: Diagnosis not present

## 2019-01-18 DIAGNOSIS — Z881 Allergy status to other antibiotic agents status: Secondary | ICD-10-CM | POA: Diagnosis not present

## 2019-01-18 DIAGNOSIS — Z85828 Personal history of other malignant neoplasm of skin: Secondary | ICD-10-CM | POA: Diagnosis not present

## 2019-01-18 DIAGNOSIS — Z79890 Hormone replacement therapy (postmenopausal): Secondary | ICD-10-CM | POA: Insufficient documentation

## 2019-01-18 DIAGNOSIS — R079 Chest pain, unspecified: Secondary | ICD-10-CM | POA: Diagnosis present

## 2019-01-18 DIAGNOSIS — I10 Essential (primary) hypertension: Secondary | ICD-10-CM | POA: Diagnosis not present

## 2019-01-18 DIAGNOSIS — I2584 Coronary atherosclerosis due to calcified coronary lesion: Secondary | ICD-10-CM | POA: Diagnosis not present

## 2019-01-18 DIAGNOSIS — E782 Mixed hyperlipidemia: Secondary | ICD-10-CM | POA: Diagnosis present

## 2019-01-18 DIAGNOSIS — Z880 Allergy status to penicillin: Secondary | ICD-10-CM | POA: Diagnosis not present

## 2019-01-18 DIAGNOSIS — Z79899 Other long term (current) drug therapy: Secondary | ICD-10-CM | POA: Insufficient documentation

## 2019-01-18 DIAGNOSIS — I2582 Chronic total occlusion of coronary artery: Secondary | ICD-10-CM | POA: Diagnosis not present

## 2019-01-18 DIAGNOSIS — I209 Angina pectoris, unspecified: Secondary | ICD-10-CM | POA: Diagnosis present

## 2019-01-18 DIAGNOSIS — E118 Type 2 diabetes mellitus with unspecified complications: Secondary | ICD-10-CM | POA: Diagnosis present

## 2019-01-18 DIAGNOSIS — I4510 Unspecified right bundle-branch block: Secondary | ICD-10-CM | POA: Diagnosis not present

## 2019-01-18 DIAGNOSIS — I2510 Atherosclerotic heart disease of native coronary artery without angina pectoris: Secondary | ICD-10-CM | POA: Diagnosis present

## 2019-01-18 DIAGNOSIS — Z955 Presence of coronary angioplasty implant and graft: Secondary | ICD-10-CM

## 2019-01-18 DIAGNOSIS — G35 Multiple sclerosis: Secondary | ICD-10-CM | POA: Diagnosis not present

## 2019-01-18 DIAGNOSIS — K760 Fatty (change of) liver, not elsewhere classified: Secondary | ICD-10-CM | POA: Diagnosis not present

## 2019-01-18 DIAGNOSIS — E119 Type 2 diabetes mellitus without complications: Secondary | ICD-10-CM | POA: Diagnosis not present

## 2019-01-18 DIAGNOSIS — F329 Major depressive disorder, single episode, unspecified: Secondary | ICD-10-CM | POA: Insufficient documentation

## 2019-01-18 DIAGNOSIS — K219 Gastro-esophageal reflux disease without esophagitis: Secondary | ICD-10-CM | POA: Diagnosis not present

## 2019-01-18 DIAGNOSIS — E039 Hypothyroidism, unspecified: Secondary | ICD-10-CM | POA: Diagnosis not present

## 2019-01-18 HISTORY — PX: CORONARY CTO INTERVENTION: CATH118236

## 2019-01-18 HISTORY — PX: CORONARY ATHERECTOMY: CATH118238

## 2019-01-18 HISTORY — PX: CORONARY STENT INTERVENTION: CATH118234

## 2019-01-18 LAB — CBC
HCT: 39.6 % (ref 36.0–46.0)
Hemoglobin: 12.6 g/dL (ref 12.0–15.0)
MCH: 26 pg (ref 26.0–34.0)
MCHC: 31.8 g/dL (ref 30.0–36.0)
MCV: 81.8 fL (ref 80.0–100.0)
Platelets: 203 10*3/uL (ref 150–400)
RBC: 4.84 MIL/uL (ref 3.87–5.11)
RDW: 15.5 % (ref 11.5–15.5)
WBC: 6 10*3/uL (ref 4.0–10.5)
nRBC: 0 % (ref 0.0–0.2)

## 2019-01-18 LAB — BASIC METABOLIC PANEL
Anion gap: 5 (ref 5–15)
BUN: 8 mg/dL (ref 6–20)
CO2: 28 mmol/L (ref 22–32)
Calcium: 9.2 mg/dL (ref 8.9–10.3)
Chloride: 108 mmol/L (ref 98–111)
Creatinine, Ser: 1.08 mg/dL — ABNORMAL HIGH (ref 0.44–1.00)
GFR calc Af Amer: >60 mL/min (ref >60)
GFR calc non Af Amer: 57 mL/min — ABNORMAL LOW (ref >60)
Glucose, Bld: 135 mg/dL — ABNORMAL HIGH (ref 70–99)
Potassium: 4.2 mmol/L (ref 3.5–5.1)
Sodium: 141 mmol/L (ref 135–145)

## 2019-01-18 LAB — GLUCOSE, CAPILLARY
Glucose-Capillary: 129 mg/dL — ABNORMAL HIGH (ref 70–99)
Glucose-Capillary: 133 mg/dL — ABNORMAL HIGH (ref 70–99)
Glucose-Capillary: 98 mg/dL (ref 70–99)
Glucose-Capillary: 123 mg/dL — ABNORMAL HIGH (ref 70–99)
Glucose-Capillary: 129 mg/dL — ABNORMAL HIGH (ref 70–99)

## 2019-01-18 LAB — POCT ACTIVATED CLOTTING TIME
Activated Clotting Time: 285 seconds
Activated Clotting Time: 296 seconds
Activated Clotting Time: 318 seconds
Activated Clotting Time: 285 seconds
Activated Clotting Time: 323 seconds
Activated Clotting Time: 219 seconds
Activated Clotting Time: 175 seconds

## 2019-01-18 SURGERY — CORONARY CTO INTERVENTION
Anesthesia: LOCAL

## 2019-01-18 MED ORDER — ONDANSETRON HCL 4 MG/2ML IJ SOLN
INTRAMUSCULAR | Status: AC
  Filled 2019-01-18: qty 2

## 2019-01-18 MED ORDER — METOPROLOL TARTRATE 25 MG PO TABS
25.0000 mg | ORAL_TABLET | Freq: Two times a day (BID) | ORAL | Status: DC
  Administered 2019-01-18: 25 mg via ORAL
  Filled 2019-01-18 (×2): qty 1

## 2019-01-18 MED ORDER — ASPIRIN 81 MG PO CHEW: 81.0000 mg | CHEWABLE_TABLET | ORAL | Status: DC

## 2019-01-18 MED ORDER — SODIUM CHLORIDE 0.9% FLUSH
3.0000 mL | Freq: Two times a day (BID) | INTRAVENOUS | Status: DC
  Administered 2019-01-18 – 2019-01-19 (×2): 3 mL via INTRAVENOUS

## 2019-01-18 MED ORDER — HEPARIN (PORCINE) IN NACL 1000-0.9 UT/500ML-% IV SOLN
INTRAVENOUS | Status: DC | PRN
  Administered 2019-01-18: 500 mL

## 2019-01-18 MED ORDER — NITROGLYCERIN IN D5W 200-5 MCG/ML-% IV SOLN
INTRAVENOUS | Status: AC
  Filled 2019-01-18: qty 250

## 2019-01-18 MED ORDER — NITROGLYCERIN 1 MG/10 ML FOR IR/CATH LAB
INTRA_ARTERIAL | Status: AC
  Filled 2019-01-18: qty 10

## 2019-01-18 MED ORDER — SODIUM CHLORIDE 0.9% FLUSH: 3.0000 mL | INTRAVENOUS | Status: DC | PRN

## 2019-01-18 MED ORDER — HYDRALAZINE HCL 20 MG/ML IJ SOLN
INTRAMUSCULAR | Status: DC | PRN
  Administered 2019-01-18: 10 mg via INTRAVENOUS

## 2019-01-18 MED ORDER — DIAZEPAM 5 MG PO TABS
5.0000 mg | ORAL_TABLET | Freq: Three times a day (TID) | ORAL | Status: DC | PRN
  Administered 2019-01-18: 23:00:00 5 mg via ORAL
  Filled 2019-01-18: qty 1

## 2019-01-18 MED ORDER — HYDRALAZINE HCL 20 MG/ML IJ SOLN
INTRAMUSCULAR | Status: AC
  Filled 2019-01-18: qty 1

## 2019-01-18 MED ORDER — LIDOCAINE HCL (PF) 1 % IJ SOLN
INTRAMUSCULAR | Status: AC
  Filled 2019-01-18: qty 30

## 2019-01-18 MED ORDER — CLOPIDOGREL BISULFATE 75 MG PO TABS: 75.0000 mg | ORAL_TABLET | ORAL | Status: DC

## 2019-01-18 MED ORDER — HEPARIN SODIUM (PORCINE) 1000 UNIT/ML IJ SOLN
INTRAMUSCULAR | Status: AC
  Filled 2019-01-18: qty 1

## 2019-01-18 MED ORDER — MIDAZOLAM HCL 2 MG/2ML IJ SOLN
INTRAMUSCULAR | Status: AC
  Filled 2019-01-18: qty 2

## 2019-01-18 MED ORDER — CLOPIDOGREL BISULFATE 75 MG PO TABS
75.0000 mg | ORAL_TABLET | Freq: Every day | ORAL | Status: DC
  Administered 2019-01-19: 75 mg via ORAL
  Filled 2019-01-18: qty 1

## 2019-01-18 MED ORDER — HEPARIN SODIUM (PORCINE) 1000 UNIT/ML IJ SOLN
INTRAMUSCULAR | Status: DC | PRN
  Administered 2019-01-18 (×2): 3000 [IU] via INTRAVENOUS
  Administered 2019-01-18: 2000 [IU] via INTRAVENOUS
  Administered 2019-01-18: 9000 [IU] via INTRAVENOUS

## 2019-01-18 MED ORDER — SODIUM CHLORIDE 0.9 % IV SOLN: INTRAVENOUS | Status: AC

## 2019-01-18 MED ORDER — HYDRALAZINE HCL 20 MG/ML IJ SOLN: 10.0000 mg | INTRAMUSCULAR | Status: AC | PRN

## 2019-01-18 MED ORDER — SODIUM CHLORIDE 0.9 % WEIGHT BASED INFUSION: 1.0000 mL/kg/h | INTRAVENOUS | Status: DC

## 2019-01-18 MED ORDER — ONDANSETRON HCL 4 MG/2ML IJ SOLN
4.0000 mg | Freq: Four times a day (QID) | INTRAMUSCULAR | Status: DC | PRN
  Administered 2019-01-18: 4 mg via INTRAVENOUS

## 2019-01-18 MED ORDER — DULAGLUTIDE 1.5 MG/0.5ML ~~LOC~~ SOAJ: 1.5000 mg | SUBCUTANEOUS | Status: DC

## 2019-01-18 MED ORDER — PANTOPRAZOLE SODIUM 40 MG PO TBEC
40.0000 mg | DELAYED_RELEASE_TABLET | Freq: Every day | ORAL | Status: DC
  Administered 2019-01-19: 10:00:00 40 mg via ORAL
  Filled 2019-01-18: qty 1

## 2019-01-18 MED ORDER — SODIUM CHLORIDE 0.9 % WEIGHT BASED INFUSION
3.0000 mL/kg/h | INTRAVENOUS | Status: DC
  Administered 2019-01-18: 3 mL/kg/h via INTRAVENOUS

## 2019-01-18 MED ORDER — NITROGLYCERIN 1 MG/10 ML FOR IR/CATH LAB
INTRA_ARTERIAL | Status: DC | PRN
  Administered 2019-01-18 (×4): 200 ug via INTRACORONARY

## 2019-01-18 MED ORDER — VERAPAMIL HCL 2.5 MG/ML IV SOLN
INTRAVENOUS | Status: AC
  Filled 2019-01-18: qty 4

## 2019-01-18 MED ORDER — ROSUVASTATIN CALCIUM 5 MG PO TABS
5.0000 mg | ORAL_TABLET | Freq: Every morning | ORAL | Status: DC
  Administered 2019-01-19: 5 mg via ORAL
  Filled 2019-01-18: qty 1

## 2019-01-18 MED ORDER — HEPARIN (PORCINE) IN NACL 1000-0.9 UT/500ML-% IV SOLN
INTRAVENOUS | Status: AC
  Filled 2019-01-18: qty 1000

## 2019-01-18 MED ORDER — CLOPIDOGREL BISULFATE 75 MG PO TABS: 75.0000 mg | ORAL_TABLET | Freq: Every day | ORAL | Status: DC

## 2019-01-18 MED ORDER — ACETAMINOPHEN 325 MG PO TABS: 650.0000 mg | ORAL_TABLET | ORAL | Status: DC | PRN

## 2019-01-18 MED ORDER — FENTANYL CITRATE (PF) 100 MCG/2ML IJ SOLN
INTRAMUSCULAR | Status: AC
  Filled 2019-01-18: qty 2

## 2019-01-18 MED ORDER — ASPIRIN EC 81 MG PO TBEC
81.0000 mg | DELAYED_RELEASE_TABLET | Freq: Every day | ORAL | Status: DC
  Administered 2019-01-19: 10:00:00 81 mg via ORAL
  Filled 2019-01-18: qty 1

## 2019-01-18 MED ORDER — GABAPENTIN 600 MG PO TABS
600.0000 mg | ORAL_TABLET | Freq: Two times a day (BID) | ORAL | Status: DC
  Administered 2019-01-18 – 2019-01-19 (×2): 600 mg via ORAL
  Filled 2019-01-18 (×2): qty 1

## 2019-01-18 MED ORDER — LEVOTHYROXINE SODIUM 112 MCG PO TABS
112.0000 ug | ORAL_TABLET | Freq: Every day | ORAL | Status: DC
  Administered 2019-01-19: 112 ug via ORAL
  Filled 2019-01-18: qty 1

## 2019-01-18 MED ORDER — LABETALOL HCL 5 MG/ML IV SOLN: 10.0000 mg | INTRAVENOUS | Status: AC | PRN

## 2019-01-18 MED ORDER — FENTANYL CITRATE (PF) 100 MCG/2ML IJ SOLN
INTRAMUSCULAR | Status: DC | PRN
  Administered 2019-01-18 (×4): 25 ug via INTRAVENOUS

## 2019-01-18 MED ORDER — NITROGLYCERIN 0.4 MG SL SUBL: 0.4000 mg | SUBLINGUAL_TABLET | SUBLINGUAL | Status: DC | PRN

## 2019-01-18 MED ORDER — SODIUM CHLORIDE 0.9 % IV SOLN: 250.0000 mL | INTRAVENOUS | Status: DC | PRN

## 2019-01-18 MED ORDER — IOHEXOL 350 MG/ML SOLN
INTRAVENOUS | Status: DC | PRN
  Administered 2019-01-18: 13:00:00 155 mL via INTRA_ARTERIAL

## 2019-01-18 MED ORDER — MIDAZOLAM HCL 2 MG/2ML IJ SOLN
INTRAMUSCULAR | Status: DC | PRN
  Administered 2019-01-18 (×4): 1 mg via INTRAVENOUS

## 2019-01-18 MED ORDER — LIDOCAINE HCL (PF) 1 % IJ SOLN
INTRAMUSCULAR | Status: DC | PRN
  Administered 2019-01-18: 13 mL
  Administered 2019-01-18: 15 mL

## 2019-01-18 MED ORDER — DULOXETINE HCL 60 MG PO CPEP
60.0000 mg | ORAL_CAPSULE | Freq: Every day | ORAL | Status: DC
  Administered 2019-01-18: 60 mg via ORAL
  Filled 2019-01-18: qty 1

## 2019-01-18 MED ORDER — HEPARIN (PORCINE) IN NACL 1000-0.9 UT/500ML-% IV SOLN
INTRAVENOUS | Status: AC
  Filled 2019-01-18: qty 500

## 2019-01-18 SURGICAL SUPPLY — 35 items
BALLN TREK OTW 2.5X12 (BALLOONS) ×2
BALLN WOLVERINE 3.00X15 (BALLOONS) ×2
BALLN ~~LOC~~ EMERGE MR 3.25X20 (BALLOONS) ×2
BALLN ~~LOC~~ EMERGE MR 4.0X15 (BALLOONS) ×2
BALLOON TREK OTW 2.5X12 (BALLOONS) IMPLANT
BALLOON WOLVERINE 3.00X15 (BALLOONS) IMPLANT
BALLOON ~~LOC~~ EMERGE MR 3.25X20 (BALLOONS) IMPLANT
BALLOON ~~LOC~~ EMERGE MR 4.0X15 (BALLOONS) IMPLANT
BUR ROTAPRO CNCT ADVNCR 1.75 (BURR) IMPLANT
BURR ROTALINK 1.50MM (BURR) ×1 IMPLANT
BURR ROTAPRO CNCT ADVNCR 1.75 (BURR) ×2
CATH INFINITI 5FR JL4 (CATHETERS) ×1 IMPLANT
CATH MACH1 8F AL1 100CM (CATHETERS) ×1 IMPLANT
CATH TURNPIKE 135CM (CATHETERS) ×1 IMPLANT
CATH VISTA GUIDE 6FR XBLAD3.5 (CATHETERS) ×1 IMPLANT
DEVICE CONTINUOUS FLUSH (MISCELLANEOUS) ×2 IMPLANT
ELECT DEFIB PAD ADLT CADENCE (PAD) ×1 IMPLANT
HOVERMATT SINGLE USE (MISCELLANEOUS) ×1 IMPLANT
KIT ENCORE 26 ADVANTAGE (KITS) ×1 IMPLANT
KIT HEART LEFT (KITS) ×2 IMPLANT
KIT HEMO VALVE WATCHDOG (MISCELLANEOUS) ×1 IMPLANT
LUBRICANT ROTAGLIDE 20CC VIAL (MISCELLANEOUS) ×1 IMPLANT
PACK CARDIAC CATHETERIZATION (CUSTOM PROCEDURE TRAY) ×2 IMPLANT
SHEATH BRITE TIP 8FR 35CM (SHEATH) ×1 IMPLANT
SHEATH PINNACLE 5F 10CM (SHEATH) ×1 IMPLANT
SHEATH PINNACLE 8F 10CM (SHEATH) IMPLANT
SHEATH PROBE COVER 6X72 (BAG) ×1 IMPLANT
STENT SYNERGY DES 3.5X20 (Permanent Stent) ×1 IMPLANT
STENT SYNERGY DES 3X38 (Permanent Stent) ×1 IMPLANT
TRANSDUCER W/STOPCOCK (MISCELLANEOUS) ×3 IMPLANT
TUBING CIL FLEX 10 FLL-RA (TUBING) ×3 IMPLANT
WIRE ASAHI PROWATER 300CM (WIRE) ×2 IMPLANT
WIRE EMERALD 3MM-J .035X150CM (WIRE) ×1 IMPLANT
WIRE EXTRA SUPPORT .009X325CM (WIRE) ×1 IMPLANT
WIRE FIGHTER CROSSING 190CM (WIRE) ×1 IMPLANT

## 2019-01-18 NOTE — Interval H&P Note (Signed)
History and Physical Interval Note:  01/18/2019 10:22 AM  Mary Strong  has presented today for surgery, with the diagnosis of CAD.  The various methods of treatment have been discussed with the patient and family. After consideration of risks, benefits and other options for treatment, the patient has consented to  Procedure(s): CORONARY CTO INTERVENTION (N/A) as a surgical intervention.  The patient's history has been reviewed, patient examined, no change in status, stable for surgery.  I have reviewed the patient's chart and labs.  Questions were answered to the patient's satisfaction.   Cath Lab Visit (complete for each Cath Lab visit)  Clinical Evaluation Leading to the Procedure:   ACS: No.  Non-ACS:    Anginal Classification: CCS III  Anti-ischemic medical therapy: Minimal Therapy (1 class of medications)  Non-Invasive Test Results: High-risk stress test findings: cardiac mortality >3%/year  Prior CABG: No previous CABG        Channelle Bottger Martinique MD,FACC 01/18/2019 10:23 AM

## 2019-01-18 NOTE — Progress Notes (Signed)
Patient received from cath lab with left groin bruised, hematoma resolved.  At 1815 hematoma was found to have returned, 18.5 cm X 4 cm.  Manual pressure held X 15 minutes with resolution.

## 2019-01-18 NOTE — Progress Notes (Signed)
Nauseated, sweaty, pale and dizzy. Medicated w/Zofran.

## 2019-01-18 NOTE — Progress Notes (Signed)
Site area: rt groin fa sheath pulled by DYoung; Lt groin femstop removed; lt fa sheath pulled by GZillich,RN Site Prior to Removal:  Level rt groin level 1 hematoma; left groin level 1 hematoma w/bruising Pressure Applied For: 145 minutes Manual:   yes Patient Status During Pull:  vagaled w/decrease in BP. 250cc NS bolus given; BP stable after bolus.  Post Pull Site:  Level bilateral groins level 1. Small ridge above rt groin puncture site; oterwise soft. Left groin soft w/bruising Post Pull Instructions Given:  yes Post Pull Pulses Present: bilateral PT palpable Dressing Applied:  Gauze and tegaderm Bedrest begins @ [id number] Comments: Dr. Martinique paged and updated.

## 2019-01-18 NOTE — Progress Notes (Signed)
Nausea lessoning. C/O 3/10 CP briefly. 12 lead EKG repeated. CP gone. Patient states she is feeling better.

## 2019-01-18 NOTE — Progress Notes (Signed)
Dr. Martinique made aware of constant oozing left groin w/hematoma. Per order, fem-stop applied to left groin w/17mmHg. Palpable left PT

## 2019-01-19 ENCOUNTER — Encounter (HOSPITAL_COMMUNITY): Payer: Self-pay | Admitting: Cardiology

## 2019-01-19 DIAGNOSIS — E039 Hypothyroidism, unspecified: Secondary | ICD-10-CM | POA: Diagnosis not present

## 2019-01-19 DIAGNOSIS — E119 Type 2 diabetes mellitus without complications: Secondary | ICD-10-CM | POA: Diagnosis not present

## 2019-01-19 DIAGNOSIS — E118 Type 2 diabetes mellitus with unspecified complications: Secondary | ICD-10-CM

## 2019-01-19 DIAGNOSIS — G4733 Obstructive sleep apnea (adult) (pediatric): Secondary | ICD-10-CM | POA: Diagnosis not present

## 2019-01-19 DIAGNOSIS — Z880 Allergy status to penicillin: Secondary | ICD-10-CM | POA: Diagnosis not present

## 2019-01-19 DIAGNOSIS — G2581 Restless legs syndrome: Secondary | ICD-10-CM | POA: Diagnosis not present

## 2019-01-19 DIAGNOSIS — I2510 Atherosclerotic heart disease of native coronary artery without angina pectoris: Secondary | ICD-10-CM | POA: Diagnosis not present

## 2019-01-19 DIAGNOSIS — G35 Multiple sclerosis: Secondary | ICD-10-CM | POA: Diagnosis not present

## 2019-01-19 DIAGNOSIS — I209 Angina pectoris, unspecified: Secondary | ICD-10-CM | POA: Diagnosis not present

## 2019-01-19 DIAGNOSIS — E782 Mixed hyperlipidemia: Secondary | ICD-10-CM | POA: Diagnosis not present

## 2019-01-19 DIAGNOSIS — F329 Major depressive disorder, single episode, unspecified: Secondary | ICD-10-CM | POA: Diagnosis not present

## 2019-01-19 DIAGNOSIS — I25119 Atherosclerotic heart disease of native coronary artery with unspecified angina pectoris: Secondary | ICD-10-CM | POA: Diagnosis not present

## 2019-01-19 DIAGNOSIS — I4510 Unspecified right bundle-branch block: Secondary | ICD-10-CM | POA: Diagnosis not present

## 2019-01-19 DIAGNOSIS — I10 Essential (primary) hypertension: Secondary | ICD-10-CM

## 2019-01-19 DIAGNOSIS — Z881 Allergy status to other antibiotic agents status: Secondary | ICD-10-CM | POA: Diagnosis not present

## 2019-01-19 LAB — BASIC METABOLIC PANEL
Anion gap: 9 (ref 5–15)
BUN: 7 mg/dL (ref 6–20)
CO2: 25 mmol/L (ref 22–32)
Calcium: 8.7 mg/dL — ABNORMAL LOW (ref 8.9–10.3)
Chloride: 107 mmol/L (ref 98–111)
Creatinine, Ser: 1.01 mg/dL — ABNORMAL HIGH (ref 0.44–1.00)
GFR calc Af Amer: >60 mL/min (ref >60)
GFR calc non Af Amer: >60 mL/min (ref >60)
Glucose, Bld: 137 mg/dL — ABNORMAL HIGH (ref 70–99)
Potassium: 4.2 mmol/L (ref 3.5–5.1)
Sodium: 141 mmol/L (ref 135–145)

## 2019-01-19 LAB — CBC
HCT: 33.4 % — ABNORMAL LOW (ref 36.0–46.0)
Hemoglobin: 10.3 g/dL — ABNORMAL LOW (ref 12.0–15.0)
MCH: 25.4 pg — ABNORMAL LOW (ref 26.0–34.0)
MCHC: 30.8 g/dL (ref 30.0–36.0)
MCV: 82.5 fL (ref 80.0–100.0)
Platelets: 210 10*3/uL (ref 150–400)
RBC: 4.05 MIL/uL (ref 3.87–5.11)
RDW: 15.9 % — ABNORMAL HIGH (ref 11.5–15.5)
WBC: 7.4 10*3/uL (ref 4.0–10.5)
nRBC: 0 % (ref 0.0–0.2)

## 2019-01-19 LAB — GLUCOSE, CAPILLARY: Glucose-Capillary: 151 mg/dL — ABNORMAL HIGH (ref 70–99)

## 2019-01-19 MED ORDER — EZETIMIBE 10 MG PO TABS: 10.0000 mg | ORAL_TABLET | Freq: Every day | ORAL | 1 refills | Status: DC

## 2019-01-19 MED FILL — Lidocaine HCl Local Preservative Free (PF) Inj 1%: INTRAMUSCULAR | Qty: 30 | Status: AC

## 2019-01-19 MED FILL — Verapamil HCl IV Soln 2.5 MG/ML: INTRAVENOUS | Qty: 2 | Status: AC

## 2019-01-19 NOTE — Progress Notes (Signed)
   The patient is ready for discharge.  Femoral access sites are bruised bilaterally but there is no significant palpable hematoma.  She was able ambulate without difficulty with cardiac rehab I  She needs TOC follow-up with Dr.Mcdowell in 7 to 10 days.  No change in her current medical regimen.  Needs dual antiplatelet therapy times at least 6 months and preferably 12 given total occlusion and long stents.  She is interested in having colonoscopy.  I would not recommend pausing antiplatelet therapy until at least 3 months out and preferably 6 months.  Aggressive risk factor modification including LDL less than 70 (statin plus PCSK9 if needed), consideration of SGLT2 therapy, and icosapent ethyl if triglyceride greater than 135.Marland Kitchen

## 2019-01-19 NOTE — Progress Notes (Signed)
CARDIAC REHAB PHASE I   PRE:  Rate/Rhythm: 87 SR    BP: sitting 99/55    SaO2:   MODE:  Ambulation: 400 ft   POST:  Rate/Rhythm: 101 ST    BP: sitting 119/72     SaO2:   Tolerated well, no c/o. Discussed stents, Plavix, diet, exercise, NTG, and CRPII. Good reception. She is quite concerned that she has colon ca. Her appetite is minimal and she processes simple carbs easier. Encouraged exercise. Will refer to [place].  Pt is interested in participating in Virtual Cardiac and Pulmonary Rehab. Pt advised that Virtual Cardiac and Pulmonary Rehab is provided at no cost to the patient.  Checklist:  1. Pt has smart device  ie smartphone and/or ipad for downloading an app  Yes 2. Reliable internet/wifi service    Yes 3. Understands how to use their smartphone and navigate within an app.  Yes  Pt verbalized understanding and is in agreement.  [place], ACSM 01/19/2019 9:21 AM

## 2019-01-19 NOTE — Discharge Summary (Addendum)
The patient has been seen in conjunction with Reino Bellis, NP. All aspects of care have been considered and discussed. The patient has been personally interviewed, examined, and all clinical data has been reviewed.   Please see note from earlier this morning.  Discharge from the hospital with [facility]  follow-up as instructed  Discharge Summary    Patient ID: Mary Strong,  MRN: [id number], DOB/AGE: [birth year] 58 y.o.  Admit date: 01/18/2019 Discharge date: 01/19/2019  Primary Care Provider: Caryl Bis Primary Cardiologist: Rozann Lesches, MD  Discharge Diagnoses    Principal Problem:   Angina pectoris [facility]) Active Problems:   CAD (coronary artery disease)   Type 2 diabetes mellitus with complication, without long-term current use of insulin (HCC)   HTN (hypertension)   Hyperlipidemia, mixed   Allergies Allergies  Allergen Reactions   Shellfish Allergy Anaphylaxis   Ciprofloxacin Hives   Copaxone [Glatiramer Acetate] Hives   Penicillins Hives and Rash    Bad headaches Did it involve swelling of the face/tongue/throat, SOB, or low BP? No Did it involve sudden or severe rash/hives, skin peeling, or any reaction on the inside of your mouth or nose? No Did you need to seek medical attention at a hospital or doctor's office? No When did it last happen?6 - 7 years If all above answers are "NO", may proceed with cephalosporin use.    Vancomycin Itching    Diagnostic Studies/Procedures    Cath: 01/18/19   Prox LAD to Mid LAD lesion is 55% stenosed.  Mid LAD lesion is 85% stenosed.  A drug-eluting stent was successfully placed using a STENT SYNERGY DES 3X38.  Post intervention, there is a 0% residual stenosis.  Prox RCA lesion is 100% stenosed.  A drug-eluting stent was successfully placed using a STENT SYNERGY DES 3.5X20.  Post intervention, there is a 0% residual stenosis.   1. Successful CTO PCI of the proximal RCA with rotational  atherectomy and DES x 1 2. Successful PCI of the mid LAD with rotational atherectomy and DES x 1.   Plan: DAPT for one year. Will observe overnight. Anticipate DC in am if stable.   Diagnostic Dominance: Right  Intervention    _____________   History of Present Illness     Mary Strong is a 58 y.o. female with PMH of HTN, HL and DM who was seen by Dr. Domenic Polite consultation for evaluation of chest pain.  Cardiac CT angiogram that showed significantly elevated calcium score of 2103, heavily calcified LAD and RCA with at least moderate associated atherosclerosis, less prominent in the circumflex.  Follow-up FFR demonstrated significant stenosis within the proximal LAD. She presented to the office on 01/06/19 to discuss cath.   She did not report any progressive symptoms since most recent evaluation.  She had been taking Lopressor regularly and her heart rate had come down some. Otherwise remained on aspirin and Crestor. Her records were requested by Dr. Domenic Polite from her PCP.  They discussed the results of her cardiac CT angiogram, also risks and benefits of a diagnostic cardiac catheterization with potential revascularization.  She was set up for outpatient cardiac cath. Underwent cath on 01/10/19 with 3v heavily calcified disease. CTO of the RCA, and mLAD with 85% lesion which was significant by FFR. Her case was reviewed with Dr. Martinique for possible CTO intervention.   Hospital Course     Presented back for outpatient cath. Underwent successful PCI/DES x1 of CTO to RCA, along with DESx1 of the mLAD  with rotational atherectomy. Plan for DAPT with ASA/plavix for at least 6 months, preferably 12 given CTO and length of stents. Her statin was increased from Crestor 5mg  once a week to daily after initial cath. Reports tolerating but is having issues with itching. States statin interferes with her MS. Did add Zetia 10mg  this admission. Consider lipid clinic referral to [facility]. She was resumed  on metformin. Consideration of SGLT2 as an outpatient. Recommend checking lipids at follow up. Did have bruising at bilateral groin sites but no hematoma. Worked well with cardiac rehab without recurrent chest pain.   General: Well developed, well nourished, female appearing in no acute distress. Head: Normocephalic, atraumatic.  Neck: Supple without bruits, JVD. Lungs:  Resp regular and unlabored, CTA. Heart: RRR, S1, S2, no S3, S4, or murmur; no rub. Abdomen: Soft, non-tender, non-distended with normoactive bowel sounds. No hepatomegaly. No rebound/guarding. No obvious abdominal masses. Extremities: No clubbing, cyanosis, edema. Distal pedal pulses are 2+ bilaterally. L/R femoral cath site stable with bruising but no hematoma Neuro: Alert and oriented X 3. Moves all extremities spontaneously. Psych: Normal affect.  Jeannene Patella was seen by Dr. Tamala Julian and determined stable for discharge home. Follow up in the office has been arranged. Medications are listed below.   _____________  Discharge Vitals Blood pressure 94/63, pulse 100, temperature 98.5 F (36.9 C), temperature source Oral, resp. rate 20, height 5\' 3"  (1.6 m), weight 88.1 kg, SpO2 95 %.  Filed Weights   01/18/19 0927 01/19/19 0537  Weight: 88.5 kg 88.1 kg    Labs & Radiologic Studies    CBC Recent Labs    01/18/19 0926 01/19/19 0430  WBC 6.0 7.4  HGB 12.6 10.3*  HCT 39.6 33.4*  MCV 81.8 82.5  PLT 203 [id number]   Basic Metabolic Panel Recent Labs    01/18/19 0926 01/19/19 0430  NA 141 141  K 4.2 4.2  CL 108 107  CO2 28 25  GLUCOSE 135* 137*  BUN 8 7  CREATININE 1.08* 1.01*  CALCIUM 9.2 8.7*   Liver Function Tests No results for input(s): AST, ALT, ALKPHOS, BILITOT, PROT, ALBUMIN in the last 72 hours. No results for input(s): LIPASE, AMYLASE in the last 72 hours. Cardiac Enzymes No results for input(s): CKTOTAL, CKMB, CKMBINDEX, TROPONINI in the last 72 hours. BNP Invalid input(s): POCBNP D-Dimer No  results for input(s): DDIMER in the last 72 hours. Hemoglobin A1C No results for input(s): HGBA1C in the last 72 hours. Fasting Lipid Panel No results for input(s): CHOL, HDL, LDLCALC, TRIG, CHOLHDL, LDLDIRECT in the last 72 hours. Thyroid Function Tests No results for input(s): TSH, T4TOTAL, T3FREE, THYROIDAB in the last 72 hours.  Invalid input(s): FREET3 _____________  Ct Coronary Morph W/cta Cor W/score W/ca W/cm &/or Wo/cm  Addendum Date: 01/04/2019   ADDENDUM REPORT: 01/04/2019 17:50 CLINICAL DATA:  89F with hypertension, OSA and chest pain. EXAM: Cardiac/Coronary  CT TECHNIQUE: The patient was scanned on a [employer]. FINDINGS: A 120 kV prospective scan was triggered in the descending thoracic aorta at 111 HU's. Axial non-contrast 3 mm slices were carried out through the heart. The data set was analyzed on a dedicated work station and scored using the [place]. Gantry rotation speed was 250 msecs and collimation was .6 mm. No beta blockade and 0.8 mg of sl NTG was given. The 3D data set was reconstructed in 5% intervals of the 67-82 % of the R-R cycle. Diastolic phases were analyzed on a dedicated work station using  MPR, MIP and VRT modes. The patient received 80 cc of contrast. Aorta: Normal size. Ascending aorta 2.8 cm. No calcifications. No dissection. Aortic Valve:  Trileaflet.  No calcifications. Coronary Arteries:  Normal coronary origin.  Right dominance. RCA is a large dominant artery that gives rise to PDA and two PLV branches. The RCA is heavily calcified. There is at least moderate (50-69%) mixed plaque in the proximal to mid RCA with one region that appears severe (>70%). The PDA is heavily calcified. The PDA is too small to determine the stenosis severity. Left main is a large artery that gives rise to LAD and LCX arteries. LAD is a large vessel that is heavily calcified. There is at least moderate (50-69%) obstruction proximally. Just proximal to D1 there is severe  (>70%) calcified plaque. LCX is a non-dominant artery that gives rise to OM1 and OM2 branches. There is mild (25-49%) calcified plaque proximally. There is non-obstructive calcified plaque in mid OM2. Other findings: Normal pulmonary vein drainage into the left atrium. Normal let atrial appendage without a thrombus. Normal size of the pulmonary artery. IMPRESSION: 1. Coronary calcium score of 2103. This was 99th percentile for age and sex matched control. 2. Normal coronary origin with right dominance. 3. Heavily calcified LAD and RCA arteries. Coronary calcification can cause blooming artifact and overestimation of plaque severity. However, in both the RCA and LAD there are regions that demonstrate calcification across the entire lumen without evidence of contrast, concerning for high degree stenosis. 4.  Will send study for FFRct. 5.  Recommend high potency statin and aspirin. 6.  Consider cardiac catheterization. Skeet Latch, MD Electronically Signed   By: Skeet Latch   On: 01/04/2019 17:50   Result Date: 01/04/2019 EXAM: OVER-READ INTERPRETATION  CT CHEST The following report is an over-read performed by radiologist Dr. Vinnie Langton of [facility], [place] on 01/04/2019. This over-read does not include interpretation of cardiac or coronary anatomy or pathology. The coronary calcium score/coronary CTA interpretation by the cardiologist is attached. COMPARISON:  None. FINDINGS: Within the visualized portions of the thorax there are no suspicious appearing pulmonary nodules or masses, there is no acute consolidative airspace disease, no pleural effusions, no pneumothorax and no lymphadenopathy. Visualized portions of the upper abdomen are unremarkable. There are no aggressive appearing lytic or blastic lesions noted in the visualized portions of the skeleton. IMPRESSION: 1. Within the visualized portions of the thorax there are no suspicious appearing pulmonary nodules or masses, there is no acute  consolidative airspace disease, no pleural effusions, no pneumothorax and no lymphadenopathy. Visualized portions of the upper abdomen are unremarkable. There are no aggressive appearing lytic or blastic lesions noted in the visualized portions of the skeleton. Electronically Signed: By: Vinnie Langton M.D. On: 01/04/2019 16:45   Ct Coronary Fractional Flow Reserve Data Prep  Result Date: 01/05/2019 EXAM: FFRCT ANALYSIS 25F with hypertension, OSA, chest pain and abnormal coronary CT-A. FINDINGS: FFRct analysis was performed on the original cardiac CT angiogram dataset. Diagrammatic representation of the FFRct analysis is provided in a separate PDF document in PACS. This dictation was created using the PDF document and an interactive 3D model of the results. 3D model is not available in the EMR/PACS. Normal FFR range is >0.80. 1. Left Main: No significant stenosis.  FFRct 0.99. 2. LAD: Significant stenosis proximally. FFRct 0.99 proximal, <0.5 distal 3. LCX: No significant stenosis.  FFRct 0.98 proximal, 0.93 distal. 4. RCA: Unable to interpret due to artifact IMPRESSION: 1.  FFRct demonstrates significant  stenosis in the proximal LAD. 2.  RCA unable to interpret due to artifact. 3.  Recommend cardiac catheterization. Note: These examples are not recommendations of [employer] and only provided as examples of what other customers are doing. Electronically Signed   By: Skeet Latch   On: 01/05/2019 08:09   Disposition   Pt is being discharged home today in good condition.  Follow-up Plans & Appointments    Follow-up Information    Satira Sark, MD Follow up on 02/07/2019.   Specialty: Cardiology Why: at 10am for your follow up appt.  Contact information: [place] [address] [phone]          Discharge Instructions    Amb Referral to Cardiac Rehabilitation   Complete by: As directed    Diagnosis:  Coronary Stents PTCA     After initial evaluation and  assessments completed: Virtual Based Care may be provided alone or in conjunction with Phase 2 Cardiac Rehab based on patient barriers.: Yes   Call MD for:  redness, tenderness, or signs of infection (pain, swelling, redness, odor or green/yellow discharge around incision site)   Complete by: As directed    Diet - low sodium heart healthy   Complete by: As directed    Discharge instructions   Complete by: As directed    Groin Site Care Refer to this sheet in the next few weeks. These instructions provide you with information on caring for yourself after your procedure. Your caregiver may also give you more specific instructions. Your treatment has been planned according to current medical practices, but problems sometimes occur. Call your caregiver if you have any problems or questions after your procedure. HOME CARE INSTRUCTIONS You may shower 24 hours after the procedure. Remove the bandage (dressing) and gently wash the site with plain soap and water. Gently pat the site dry.  Do not apply powder or lotion to the site.  Do not sit in a bathtub, swimming pool, or whirlpool for 5 to 7 days.  No bending, squatting, or lifting anything over 10 pounds (4.5 kg) as directed by your caregiver.  Inspect the site at least twice daily.  Do not drive home if you are discharged the same day of the procedure. Have someone else drive you.  You may drive 24 hours after the procedure unless otherwise instructed by your caregiver.  What to expect: Any bruising will usually fade within 1 to 2 weeks.  Blood that collects in the tissue (hematoma) may be painful to the touch. It should usually decrease in size and tenderness within 1 to 2 weeks.  SEEK IMMEDIATE MEDICAL CARE IF: You have unusual pain at the groin site or down the affected leg.  You have redness, warmth, swelling, or pain at the groin site.  You have drainage (other than a small amount of blood on the dressing).  You have chills.  You have a  fever or persistent symptoms for more than 72 hours.  You have a fever and your symptoms suddenly get worse.  Your leg becomes pale, cool, tingly, or numb.  You have heavy bleeding from the site. Hold pressure on the site. Marland Kitchen  PLEASE DO NOT MISS ANY DOSES OF YOUR PLAVIX!!!!! Also keep a log of you blood pressures and bring back to your follow up appt. Please call the office with any questions.   Patients taking blood thinners should generally stay away from medicines like ibuprofen, Advil, Motrin, naproxen, and Aleve due  to risk of stomach bleeding. You may take Tylenol as directed or talk to your primary doctor about alternatives.   Increase activity slowly   Complete by: As directed      Discharge Medications     Medication List    STOP taking these medications   isosorbide mononitrate 30 MG 24 hr tablet Commonly known as: IMDUR     TAKE these medications   aspirin 81 MG tablet Take 81 mg by mouth daily.   calcium carbonate 500 MG chewable tablet Commonly known as: TUMS - dosed in mg elemental calcium Chew 3 tablets by mouth daily as needed for indigestion or heartburn.   clopidogrel 75 MG tablet Commonly known as: Plavix Take 1 tablet (75 mg total) by mouth daily.   diazepam 5 MG tablet Commonly known as: VALIUM TAKE 1 TABLET BY MOUTH THREE TIMES DAILY AS NEEDED What changed:   how much to take  how to take this  when to take this  reasons to take this  additional instructions   DULoxetine 60 MG capsule Commonly known as: CYMBALTA Take 1 capsule (60 mg total) by mouth at bedtime.   ezetimibe 10 MG tablet Commonly known as: Zetia Take 1 tablet (10 mg total) by mouth daily.   gabapentin 600 MG tablet Commonly known as: NEURONTIN TAKE 1 TABLET BY MOUTH THREE TIMES DAILY What changed: when to take this   levothyroxine 112 MCG tablet Commonly known as: SYNTHROID Take 112 mcg by mouth daily before breakfast.   metFORMIN 500 MG tablet Commonly known as:  GLUCOPHAGE Take 1 tablet (500 mg total) by mouth 2 (two) times daily. Notes to patient: Do not restart until 01/20/19!!!!   metoprolol tartrate 25 MG tablet Commonly known as: LOPRESSOR Take 1 tablet (25 mg total) by mouth 2 (two) times daily.   mometasone 0.1 % cream Commonly known as: ELOCON Apply 1 application topically daily as needed (psoriasis).   nitroGLYCERIN 0.4 MG SL tablet Commonly known as: Nitrostat Place 1 tablet (0.4 mg total) under the tongue every 5 (five) minutes as needed. What changed: reasons to take this   pantoprazole 40 MG tablet Commonly known as: PROTONIX Take 40 mg by mouth daily.   rosuvastatin 5 MG tablet Commonly known as: CRESTOR Take 1 tablet (5 mg total) by mouth every morning. Saturday evening What changed: additional instructions   Trulicity 1.5 0000000 Sopn Generic drug: Dulaglutide Inject 1.5 mg into the skin every Saturday.       Acute coronary syndrome (MI, NSTEMI, STEMI, etc) this admission?: No.     Outstanding Labs/Studies   Lipid clinic referral if LDL not a target.  Duration of Discharge Encounter   Greater than 30 minutes including physician time.  Signed, Reino Bellis NP-C 01/19/2019, 11:39 AM

## 2019-01-20 DIAGNOSIS — Z6834 Body mass index (BMI) 34.0-34.9, adult: Secondary | ICD-10-CM | POA: Diagnosis not present

## 2019-01-20 DIAGNOSIS — R079 Chest pain, unspecified: Secondary | ICD-10-CM | POA: Diagnosis not present

## 2019-01-20 DIAGNOSIS — E1142 Type 2 diabetes mellitus with diabetic polyneuropathy: Secondary | ICD-10-CM | POA: Diagnosis not present

## 2019-01-23 ENCOUNTER — Telehealth (INDEPENDENT_AMBULATORY_CARE_PROVIDER_SITE_OTHER): Payer: Self-pay | Admitting: Nurse Practitioner

## 2019-01-23 ENCOUNTER — Encounter (INDEPENDENT_AMBULATORY_CARE_PROVIDER_SITE_OTHER): Payer: Self-pay

## 2019-01-23 NOTE — Telephone Encounter (Signed)
Patient left message stating she would like to discuss having a colonoscopy - ph# [phone]

## 2019-01-24 ENCOUNTER — Other Ambulatory Visit (INDEPENDENT_AMBULATORY_CARE_PROVIDER_SITE_OTHER): Payer: Self-pay | Admitting: Nurse Practitioner

## 2019-01-24 DIAGNOSIS — R1084 Generalized abdominal pain: Secondary | ICD-10-CM

## 2019-01-24 NOTE — Progress Notes (Signed)
Bmp

## 2019-01-24 NOTE — Telephone Encounter (Signed)
I called pt, she wants to have the abd/pelvic CT scan now, see last office visit. She had a cardiac cath and 2 stents were placed. She cannot schedule egd or colonoscopy for 6 months post stent placement. She worries she has cancer. I will check BMP as she had chest CT and cardiac cath with IV contrast since her last lab draw. Once I have BMP results, I will order abd/pelvic CT. Also advised pt, she can contact her PCP to ask about a Cologuard stool test.

## 2019-01-27 DIAGNOSIS — Z6834 Body mass index (BMI) 34.0-34.9, adult: Secondary | ICD-10-CM | POA: Diagnosis not present

## 2019-01-27 DIAGNOSIS — I10 Essential (primary) hypertension: Secondary | ICD-10-CM | POA: Diagnosis not present

## 2019-01-27 DIAGNOSIS — I2510 Atherosclerotic heart disease of native coronary artery without angina pectoris: Secondary | ICD-10-CM | POA: Diagnosis not present

## 2019-01-27 DIAGNOSIS — G35 Multiple sclerosis: Secondary | ICD-10-CM | POA: Diagnosis not present

## 2019-01-31 ENCOUNTER — Telehealth (INDEPENDENT_AMBULATORY_CARE_PROVIDER_SITE_OTHER): Payer: Self-pay | Admitting: Nurse Practitioner

## 2019-01-31 ENCOUNTER — Encounter (INDEPENDENT_AMBULATORY_CARE_PROVIDER_SITE_OTHER): Payer: Self-pay

## 2019-01-31 NOTE — Telephone Encounter (Signed)
Noted  

## 2019-01-31 NOTE — Telephone Encounter (Signed)
Patient called stated she has had lab work done by her PCP and they will be faxing results to you.

## 2019-02-01 DIAGNOSIS — E782 Mixed hyperlipidemia: Secondary | ICD-10-CM | POA: Diagnosis not present

## 2019-02-01 DIAGNOSIS — I10 Essential (primary) hypertension: Secondary | ICD-10-CM | POA: Diagnosis not present

## 2019-02-02 ENCOUNTER — Encounter: Payer: Self-pay | Admitting: Cardiology

## 2019-02-02 DIAGNOSIS — I10 Essential (primary) hypertension: Secondary | ICD-10-CM | POA: Diagnosis not present

## 2019-02-02 DIAGNOSIS — E1142 Type 2 diabetes mellitus with diabetic polyneuropathy: Secondary | ICD-10-CM | POA: Diagnosis not present

## 2019-02-02 DIAGNOSIS — E8881 Metabolic syndrome: Secondary | ICD-10-CM | POA: Diagnosis not present

## 2019-02-02 DIAGNOSIS — R002 Palpitations: Secondary | ICD-10-CM | POA: Diagnosis not present

## 2019-02-02 DIAGNOSIS — E039 Hypothyroidism, unspecified: Secondary | ICD-10-CM | POA: Diagnosis not present

## 2019-02-02 DIAGNOSIS — R5383 Other fatigue: Secondary | ICD-10-CM | POA: Diagnosis not present

## 2019-02-02 DIAGNOSIS — K219 Gastro-esophageal reflux disease without esophagitis: Secondary | ICD-10-CM | POA: Diagnosis not present

## 2019-02-02 DIAGNOSIS — E782 Mixed hyperlipidemia: Secondary | ICD-10-CM | POA: Diagnosis not present

## 2019-02-03 ENCOUNTER — Ambulatory Visit: Payer: Medicare Other | Admitting: Cardiology

## 2019-02-06 ENCOUNTER — Other Ambulatory Visit (INDEPENDENT_AMBULATORY_CARE_PROVIDER_SITE_OTHER): Payer: Self-pay | Admitting: Nurse Practitioner

## 2019-02-06 ENCOUNTER — Telehealth (INDEPENDENT_AMBULATORY_CARE_PROVIDER_SITE_OTHER): Payer: Self-pay | Admitting: Nurse Practitioner

## 2019-02-06 DIAGNOSIS — R1084 Generalized abdominal pain: Secondary | ICD-10-CM

## 2019-02-06 DIAGNOSIS — R198 Other specified symptoms and signs involving the digestive system and abdomen: Secondary | ICD-10-CM

## 2019-02-06 NOTE — Telephone Encounter (Signed)
CT sch'd 02/20/19 at 9 ([phone]), npo 4 hrs, pick up contrast, patient aware

## 2019-02-06 NOTE — Telephone Encounter (Signed)
Mary Strong, pls call pt to schedule an abd/pelvic CT with oral and IV contrast. Orders in Epic. thx.

## 2019-02-07 ENCOUNTER — Ambulatory Visit: Payer: Medicare Other | Admitting: Cardiology

## 2019-02-08 ENCOUNTER — Encounter: Payer: Self-pay | Admitting: Cardiology

## 2019-02-08 ENCOUNTER — Other Ambulatory Visit: Payer: Self-pay

## 2019-02-08 ENCOUNTER — Ambulatory Visit (INDEPENDENT_AMBULATORY_CARE_PROVIDER_SITE_OTHER): Payer: Medicare Other | Admitting: Cardiology

## 2019-02-08 VITALS — BP 118/72 | HR 82 | Ht 63.0 in | Wt 190.0 lb

## 2019-02-08 DIAGNOSIS — I25119 Atherosclerotic heart disease of native coronary artery with unspecified angina pectoris: Secondary | ICD-10-CM | POA: Diagnosis not present

## 2019-02-08 DIAGNOSIS — E782 Mixed hyperlipidemia: Secondary | ICD-10-CM

## 2019-02-08 DIAGNOSIS — E1142 Type 2 diabetes mellitus with diabetic polyneuropathy: Secondary | ICD-10-CM | POA: Diagnosis not present

## 2019-02-08 DIAGNOSIS — I10 Essential (primary) hypertension: Secondary | ICD-10-CM | POA: Diagnosis not present

## 2019-02-08 DIAGNOSIS — I208 Other forms of angina pectoris: Secondary | ICD-10-CM | POA: Diagnosis not present

## 2019-02-08 DIAGNOSIS — F331 Major depressive disorder, recurrent, moderate: Secondary | ICD-10-CM | POA: Diagnosis not present

## 2019-02-08 DIAGNOSIS — E119 Type 2 diabetes mellitus without complications: Secondary | ICD-10-CM

## 2019-02-08 DIAGNOSIS — E039 Hypothyroidism, unspecified: Secondary | ICD-10-CM | POA: Diagnosis not present

## 2019-02-08 DIAGNOSIS — G35 Multiple sclerosis: Secondary | ICD-10-CM | POA: Diagnosis not present

## 2019-02-08 DIAGNOSIS — Z1389 Encounter for screening for other disorder: Secondary | ICD-10-CM | POA: Diagnosis not present

## 2019-02-08 DIAGNOSIS — K7581 Nonalcoholic steatohepatitis (NASH): Secondary | ICD-10-CM | POA: Diagnosis not present

## 2019-02-08 DIAGNOSIS — Z6833 Body mass index (BMI) 33.0-33.9, adult: Secondary | ICD-10-CM | POA: Diagnosis not present

## 2019-02-08 DIAGNOSIS — Z1331 Encounter for screening for depression: Secondary | ICD-10-CM | POA: Diagnosis not present

## 2019-02-08 NOTE — Progress Notes (Signed)
Cardiology Office Note  Date: 02/08/2019   ID: Mary Strong, DOB [birth year], MRN [id number]  PCP:  Caryl Bis, MD  Cardiologist:  Rozann Lesches, MD Electrophysiologist:  None   Chief Complaint  Patient presents with  . Cardiac follow-up    History of Present Illness: Mary Strong is a 58 y.o. female last seen in September.  She was referred at that time for diagnostic cardiac catheterization following abnormal cardiac CTA.  Diagnostic procedure was performed by Dr. Tamala Julian with finding of multivessel coronary calcification, 50 to 60% followed by 85% tandem stenoses within the mid LAD supplying collaterals to the RCA via septal perforators and apical segment, occlusion of the RCA in the mid vessel.  She underwent staged intervention by Dr. Martinique including atherectomy and DES as CTO intervention of the RCA and atherectomy and DES to the mid LAD.  She presents today for follow-up.  She states that she has better stamina, chest pain has resolved.  She is able to walk now without being short of breath.  Still does not have the energy that she would like.  She saw her PCP Dr. Quillian Quince this morning and reviewed lab work, she is dropping this off for [place] to review as well.  I went over her medications.  She is now taking Crestor 5 mg every day along with Zetia.  She has been able to tolerate this.  We discussed the importance of dual antiplatelet therapy in light of recent DES interventions.   Past Medical History:  Diagnosis Date  . Anxiety   . Arthritis   . CAD (coronary artery disease)    Heavily calcified vessels, DES to CTO RCA and DES to mid LAD September 2020  . Constipation   . Depression   . Essential hypertension   . Fatty liver   . GERD (gastroesophageal reflux disease)   . History of migraine   . History of sleep apnea    Currently not using CPAP  . Hypothyroidism   . Multiple sclerosis ([place])   . Neurogenic bladder   . Neuropathy   . Palpitations   .  Restless leg syndrome   . Skin cancer    Basal cell cancer on forehead, strong family hx of adenocarcinoma  . Type 2 diabetes mellitus ([place])     Past Surgical History:  Procedure Laterality Date  . CESAREAN SECTION     x 2  . CHOLECYSTECTOMY    . CORONARY ATHERECTOMY N/A 01/18/2019   Procedure: CORONARY ATHERECTOMY;  Surgeon: Martinique, Peter M, MD;  Location: [facility];  Service: Cardiovascular;  Laterality: N/A;  . CORONARY CTO INTERVENTION N/A 01/18/2019   Procedure: CORONARY CTO INTERVENTION;  Surgeon: Martinique, Peter M, MD;  Location: [facility];  Service: Cardiovascular;  Laterality: N/A;  . CORONARY STENT INTERVENTION N/A 01/18/2019   Procedure: CORONARY STENT INTERVENTION;  Surgeon: Martinique, Peter M, MD;  Location: [facility];  Service: Cardiovascular;  Laterality: N/A;  . ENDOMETRIAL ABLATION    . JOINT REPLACEMENT    . LEFT HEART CATH AND CORONARY ANGIOGRAPHY N/A 01/10/2019   Procedure: LEFT HEART CATH AND CORONARY ANGIOGRAPHY;  Surgeon: Belva Crome, MD;  Location: [facility];  Service: Cardiovascular;  Laterality: N/A;  . TONSILLECTOMY    . TOTAL HIP ARTHROPLASTY Right 12/04/2015  . TOTAL HIP ARTHROPLASTY Right 12/04/2015   Procedure: TOTAL HIP ARTHROPLASTY ANTERIOR APPROACH;  Surgeon: Marybelle Killings, MD;  Location: [facility];  Service:  Orthopedics;  Laterality: Right;  . TOTAL HIP ARTHROPLASTY Left 01/24/2016   Procedure: TOTAL HIP ARTHROPLASTY ANTERIOR APPROACH;  Surgeon: Marybelle Killings, MD;  Location: [facility];  Service: Orthopedics;  Laterality: Left;  anterior approach  . TUBAL LIGATION      Current Outpatient Medications  Medication Sig Dispense Refill  . aspirin 81 MG tablet Take 81 mg by mouth daily.    . calcium carbonate (TUMS - DOSED IN MG ELEMENTAL CALCIUM) 500 MG chewable tablet Chew 3 tablets by mouth daily as needed for indigestion or heartburn.    . clopidogrel (PLAVIX) 75 MG tablet Take 1 tablet (75 mg total) by mouth daily. 30 tablet 11  .  diazepam (VALIUM) 5 MG tablet TAKE 1 TABLET BY MOUTH THREE TIMES DAILY AS NEEDED (Patient taking differently: Take 5 mg by mouth 3 (three) times daily as needed for anxiety. ) 90 tablet 5  . Dulaglutide (TRULICITY) 1.5 0000000 SOPN Inject 1.5 mg into the skin every Saturday.     . DULoxetine (CYMBALTA) 60 MG capsule Take 1 capsule (60 mg total) by mouth at bedtime. 30 capsule 11  . ezetimibe (ZETIA) 10 MG tablet Take 1 tablet (10 mg total) by mouth daily. 30 tablet 1  . gabapentin (NEURONTIN) 600 MG tablet TAKE 1 TABLET BY MOUTH THREE TIMES DAILY (Patient taking differently: Take 600 mg by mouth 2 (two) times daily. ) 90 tablet 5  . levothyroxine (SYNTHROID) 112 MCG tablet Take 112 mcg by mouth daily before breakfast.    . metFORMIN (GLUCOPHAGE) 500 MG tablet Take 1 tablet (500 mg total) by mouth 2 (two) times daily.    . metoprolol tartrate (LOPRESSOR) 25 MG tablet Take 1 tablet (25 mg total) by mouth 2 (two) times daily.    . mometasone (ELOCON) 0.1 % cream Apply 1 application topically daily as needed (psoriasis).     . nitroGLYCERIN (NITROSTAT) 0.4 MG SL tablet Place 1 tablet (0.4 mg total) under the tongue every 5 (five) minutes as needed. (Patient taking differently: Place 0.4 mg under the tongue every 5 (five) minutes as needed for chest pain. ) 25 tablet 2  . pantoprazole (PROTONIX) 40 MG tablet Take 40 mg by mouth daily.     . rosuvastatin (CRESTOR) 5 MG tablet Take 1 tablet (5 mg total) by mouth every morning. Saturday evening (Patient taking differently: Take 5 mg by mouth every morning. ) 60 tablet 11   No current facility-administered medications for this visit.    Allergies:  Shellfish allergy, Ciprofloxacin, Copaxone [glatiramer acetate], Penicillins, and Vancomycin   Social History: The patient  reports that she has never smoked. She has never used smokeless tobacco. She reports current alcohol use. She reports current drug use. Drug: Marijuana.   ROS:  Please see the history of  present illness. Otherwise, complete review of systems is positive for none.  All other systems are reviewed and negative.   Physical Exam: VS:  BP 118/72   Pulse 82   Ht 5\' 3"  (1.6 m)   Wt 190 lb (86.2 kg)   SpO2 98%   BMI 33.66 kg/m , BMI Body mass index is 33.66 kg/m.  Wt Readings from Last 3 Encounters:  02/08/19 190 lb (86.2 kg)  01/19/19 194 lb 3.2 oz (88.1 kg)  01/10/19 197 lb (89.4 kg)    General: Patient appears comfortable at rest. HEENT: Conjunctiva and lids normal, wearing a mask. Neck: Supple, no elevated JVP or carotid bruits, no thyromegaly. Lungs: Clear to auscultation, nonlabored  breathing at rest. Cardiac: Regular rate and rhythm, no S3 or significant systolic murmur, no pericardial rub. Abdomen: Soft, nontender, bowel sounds present. Extremities: No pitting edema, distal pulses 2+. Skin: Warm and dry. Musculoskeletal: No kyphosis. Neuropsychiatric: Alert and oriented x3, affect grossly appropriate.  ECG:  An ECG dated 01/19/2019 was personally reviewed today and demonstrated:  Sinus rhythm with right bundle branch block and anterolateral T wave inversions.  Recent Labwork: 12/26/2018: ALT 19; AST 21 01/19/2019: BUN 7; Creatinine, Ser 1.01; Hemoglobin 10.3; Platelets 210; Potassium 4.2; Sodium 141  June 2020: Cholesterol 157, triglycerides 229, HDL 29, LDL 62  Other Studies Reviewed Today:  Cardiac catheterization 01/10/2019:  Heavy three-vessel coronary calcification.  Normal left main.  Mid LAD within a heavily calcified segment 50 to 60% followed by 85% tandem stenoses.  Supplies collaterals to RCA via septal perforators and apical segment.  Circumflex gives 3 obtuse marginal branches.  Second obtuse marginal contains mid vessel 40% narrowing.  Supplies collaterals to distal RCA.  Total occlusion in the mid vessel within a heavily calcified segment.  Total occlusion appears to be a long segment.  Normal left ventricular size and function.  EF 60%.   RECOMMENDATIONS:   Aggressive preventive therapy driving LDL down to 55 with PCSK9 therapy since she seems to be intolerant to statin therapy.  Also consider SGLT2 therapy for diabetes.  Anti-ischemic therapy as needed to control symptoms.  Consider surgical revascularization given diabetes, severe hyperlipidemia, significant involvement of LAD (FFR by CTA).  The other option would be RCA CTO PCI followed in a staged manner by LAD orbital atherectomy and stenting.  Since the RCA is well collateralized, and the LAD is involved in the mid segment, I would recommend percutaneous intervention prior to subjecting her to surgery.  PCI 01/18/2019:  Prox LAD to Mid LAD lesion is 55% stenosed.  Mid LAD lesion is 85% stenosed.  A drug-eluting stent was successfully placed using a STENT SYNERGY DES 3X38.  Post intervention, there is a 0% residual stenosis.  Prox RCA lesion is 100% stenosed.  A drug-eluting stent was successfully placed using a STENT SYNERGY DES 3.5X20.  Post intervention, there is a 0% residual stenosis.   1. Successful CTO PCI of the proximal RCA with rotational atherectomy and DES x 1 2. Successful PCI of the mid LAD with rotational atherectomy and DES x 1.   Plan: DAPT for one year. Will observe overnight. Anticipate DC in am if stable.   Assessment and Plan:  1.  Multivessel, heavily calcified CAD status post recent DES to CTO RCA and mid LAD as discussed above.  Symptomatically, patient has improved.  We will continue with medical therapy including aspirin and Plavix.  She is tolerating daily statin along with Zetia.  2.  Mixed hyperlipidemia.  Currently on Crestor and Zetia daily.  Plan to review recent lab work from PCP.  3.  Essential hypertension by history, her blood pressure is normal today.  4.  Type 2 diabetes mellitus on Trulicity and Glucophage.  She follows with Dr. Quillian Quince.  Medication Adjustments/Labs and Tests Ordered: Current medicines are reviewed  at length with the patient today.  Concerns regarding medicines are outlined above.   Tests Ordered: No orders of the defined types were placed in this encounter.   Medication Changes: No orders of the defined types were placed in this encounter.   Disposition:  Follow up 6 months in the [place] office.  Signed, Satira Sark, MD, [facility] 02/08/2019 2:07 PM  [facility] [place], [place], [place] [address] Phone: [phone]; Fax: [phone]

## 2019-02-08 NOTE — Patient Instructions (Addendum)

## 2019-02-18 DIAGNOSIS — I2510 Atherosclerotic heart disease of native coronary artery without angina pectoris: Secondary | ICD-10-CM | POA: Diagnosis not present

## 2019-02-18 DIAGNOSIS — G35 Multiple sclerosis: Secondary | ICD-10-CM | POA: Diagnosis not present

## 2019-02-20 ENCOUNTER — Other Ambulatory Visit: Payer: Self-pay

## 2019-02-20 ENCOUNTER — Ambulatory Visit (HOSPITAL_COMMUNITY)
Admission: RE | Admit: 2019-02-20 | Discharge: 2019-02-20 | Disposition: A | Discharge status: Home / Self Care | Payer: Medicare Other | Source: Ambulatory Visit | Attending: Nurse Practitioner | Admitting: Nurse Practitioner

## 2019-02-20 DIAGNOSIS — R1084 Generalized abdominal pain: Secondary | ICD-10-CM | POA: Insufficient documentation

## 2019-02-20 DIAGNOSIS — R161 Splenomegaly, not elsewhere classified: Secondary | ICD-10-CM | POA: Diagnosis not present

## 2019-02-20 DIAGNOSIS — R198 Other specified symptoms and signs involving the digestive system and abdomen: Secondary | ICD-10-CM | POA: Diagnosis not present

## 2019-02-20 DIAGNOSIS — K5900 Constipation, unspecified: Secondary | ICD-10-CM | POA: Diagnosis not present

## 2019-02-20 MED ORDER — IOHEXOL 300 MG/ML  SOLN
100.0000 mL | Freq: Once | INTRAMUSCULAR | Status: AC | PRN
  Administered 2019-02-20: 100 mL via INTRAVENOUS

## 2019-02-25 NOTE — Progress Notes (Signed)
   Subjective:    Patient ID: Mary Strong, female    DOB: [birth year], 58 y.o.   MRN: [id number]  HPI  Mary Strong. Mary Strong is a 58 year old female with a past medical history significant for coronary artery disease s/p cardiac catheterization with DES to the mid LAD and RCA 01/10/2019 on Plavix and ASA, hyperlipidemia, DM II, Multiple Sclerosis, DM II, sleep apnea, hypothyroidism, neurogenic bladder, arthritis, DJD, anxiety, depression, fatty liver and GERD. S/P cholecystectomy in 2011.  She is concerned about having cancer. Significant family history of cancer, maternal grandmother died from pancreatic cancer, maternal aunt died from stomach cancer and 3 great maternal aunts died from "intestinal cancer". She reports having 3 colonoscopies in the past, last colonoscopy was in 2001, no polyps per her report. She did not have a screening colonoscopy at the age of 48.    She was seen in the office on 12/26/2018 with complaints of chest pain, constipation and abdominal bloat. An EGD and colonoscopy deferred as she was advised to proceed with a cardiac evaluation. She underwent a cardiac catheterization and 2 stents were placed 01/10/2019 as documented above.  EGD and colonoscopy to be scheduled in 6 months post stent placement. She called our office worried about having cancer therefore an abdominal/pelvic CT with contrast was done on which was negative.   She has intermittent constipation, she took Miralax x 3 doses and passed several small formed stools. No rectal bleeding. She continues to have abdominal bloat. She is scheduled to see her gynecologist for her annual gyn exam in 2 weeks. No further chest pain. However, she is having epigastric pain. No heartburn.   Abdominal/pelvic CT 02/20/2019: 1. No findings to explain the patient's given clinical history, other than constipation. 2. Mild splenic enlargement. 3. Coronary artery calcification.  CBC Latest Ref Rng & Units 01/19/2019 01/18/2019  12/26/2018  WBC 4.0 - 10.5 K/uL 7.4 6.0 6.2  Hemoglobin 12.0 - 15.0 g/dL 10.3(L) 12.6 12.4  Hematocrit 36.0 - 46.0 % 33.4(L) 39.6 37.4  Platelets 150 - 400 K/uL 210 203 183    Review of Systems See HPI, all other systems reviewed and are negative     Objective:   Physical Exam  BP 127/82   Pulse 89   Temp 98.6 F (37 C)   Ht 5\' 3"  (1.6 m)   Wt 193 lb 1.6 oz (87.6 kg)   BMI 34.21 kg/m  General: 58 year old female in NAD Heart: RRR, no murmurs Lungs: clear throughout Abdomen: soft, nontender, + BS x 4 quads, no HSM Extremities: no edema Neuro: alert and oriented x 4, no focal deficits    Assessment & Plan:   22. 58 year old female with constipation -trial with Linzess 73mcg one tab to be taken 30 minutes before breakfast, samples provided  2. GERD with epigastric pain, Family history of gastric cancer.  -Continue Pantoprazole 40mg  once daily  3. CAD s/p DES x 2 01/2019 on Plavix and ASA  4. Normocytic anemia. Hg 10.3. HCT 33.4. MC 82.5.  -repeat CBC, check celiac panel and iron panel -EGD and colonoscopy in 6 months  -heme slide x 1   5. MS  6. DM II  Follow up in the office in 8 to 12 weeks

## 2019-02-27 ENCOUNTER — Ambulatory Visit (INDEPENDENT_AMBULATORY_CARE_PROVIDER_SITE_OTHER): Payer: Medicare Other | Admitting: Nurse Practitioner

## 2019-02-27 ENCOUNTER — Encounter (INDEPENDENT_AMBULATORY_CARE_PROVIDER_SITE_OTHER): Payer: Self-pay | Admitting: Nurse Practitioner

## 2019-02-27 ENCOUNTER — Other Ambulatory Visit: Payer: Self-pay

## 2019-02-27 VITALS — BP 127/82 | HR 89 | Temp 98.6°F | Ht 63.0 in | Wt 193.1 lb

## 2019-02-27 DIAGNOSIS — R1013 Epigastric pain: Secondary | ICD-10-CM | POA: Diagnosis not present

## 2019-02-27 DIAGNOSIS — D649 Anemia, unspecified: Secondary | ICD-10-CM | POA: Diagnosis not present

## 2019-02-27 DIAGNOSIS — K5909 Other constipation: Secondary | ICD-10-CM | POA: Diagnosis not present

## 2019-02-27 DIAGNOSIS — R140 Abdominal distension (gaseous): Secondary | ICD-10-CM | POA: Diagnosis not present

## 2019-02-27 NOTE — Patient Instructions (Signed)
1.  Complete the provided lab order today  2.  An abdominal ultrasound to be done in 6 months, earlier if upper abdominal pain persists or if the above laboratory results are abnormal  3.  Try Linzess 72 mcg 1 capsule to be taken 30 minutes before breakfast, call Dayanis Bergquist in 1 week with an update  4.  Schedule EGD and colonoscopy March 2021 6 months after your coronary stent was placed, an EGD and colonoscopy are required earlier due to worsening symptoms a cardiac clearance would be necessary  5.  Complete the heme slides  6.  Follow-up in the office in 8 to 12 weeks

## 2019-02-28 LAB — CBC WITH DIFFERENTIAL/PLATELET
Absolute Monocytes: 403 cells/uL (ref 200–950)
Basophils Absolute: 13 cells/uL (ref 0–200)
Basophils Relative: 0.2 %
Eosinophils Absolute: 51 cells/uL (ref 15–500)
Eosinophils Relative: 0.8 %
HCT: 35.8 % (ref 35.0–45.0)
Hemoglobin: 11.5 g/dL — ABNORMAL LOW (ref 11.7–15.5)
Lymphs Abs: 1325 cells/uL (ref 850–3900)
MCH: 24.4 pg — ABNORMAL LOW (ref 27.0–33.0)
MCHC: 32.1 g/dL (ref 32.0–36.0)
MCV: 76 fL — ABNORMAL LOW (ref 80.0–100.0)
MPV: 11.7 fL (ref 7.5–12.5)
Monocytes Relative: 6.3 %
Neutro Abs: 4608 cells/uL (ref 1500–7800)
Neutrophils Relative %: 72 %
Platelets: 222 10*3/uL (ref 140–400)
RBC: 4.71 10*6/uL (ref 3.80–5.10)
RDW: 15.5 % — ABNORMAL HIGH (ref 11.0–15.0)
Total Lymphocyte: 20.7 %
WBC: 6.4 10*3/uL (ref 3.8–10.8)

## 2019-02-28 LAB — COMPLETE METABOLIC PANEL WITH GFR
AG Ratio: 1.8 (calc) (ref 1.0–2.5)
ALT: 13 U/L (ref 6–29)
AST: 19 U/L (ref 10–35)
Albumin: 4.3 g/dL (ref 3.6–5.1)
Alkaline phosphatase (APISO): 80 U/L (ref 37–153)
BUN: 12 mg/dL (ref 7–25)
CO2: 26 mmol/L (ref 20–32)
Calcium: 9.3 mg/dL (ref 8.6–10.4)
Chloride: 106 mmol/L (ref 98–110)
Creat: 1.02 mg/dL (ref 0.50–1.05)
GFR, Est African American: 71 mL/min/{1.73_m2} (ref >=60)
GFR, Est Non African American: 61 mL/min/{1.73_m2} (ref >=60)
Globulin: 2.4 g/dL (calc) (ref 1.9–3.7)
Glucose, Bld: 95 mg/dL (ref 65–139)
Potassium: 4.5 mmol/L (ref 3.5–5.3)
Sodium: 140 mmol/L (ref 135–146)
Total Bilirubin: 0.4 mg/dL (ref 0.2–1.2)
Total Protein: 6.7 g/dL (ref 6.1–8.1)

## 2019-02-28 LAB — C-REACTIVE PROTEIN: CRP: 4 mg/L (ref <8.0)

## 2019-02-28 LAB — CELIAC DISEASE PANEL
(tTG) Ab, IgA: 1 U/mL
(tTG) Ab, IgG: 1 U/mL
Gliadin IgA: 13 Units
Gliadin IgG: 5 Units
Immunoglobulin A: 292 mg/dL (ref 47–310)

## 2019-02-28 LAB — IRON,TIBC AND FERRITIN PANEL
%SAT: 11 % (calc) — ABNORMAL LOW (ref 16–45)
Ferritin: 9 ng/mL — ABNORMAL LOW (ref 16–232)
Iron: 46 ug/dL (ref 45–160)
TIBC: 401 mcg/dL (calc) (ref 250–450)

## 2019-02-28 LAB — LIPASE: Lipase: 30 U/L (ref 7–60)

## 2019-03-02 ENCOUNTER — Encounter (INDEPENDENT_AMBULATORY_CARE_PROVIDER_SITE_OTHER): Payer: Self-pay

## 2019-03-02 ENCOUNTER — Telehealth (INDEPENDENT_AMBULATORY_CARE_PROVIDER_SITE_OTHER): Payer: Self-pay | Admitting: Nurse Practitioner

## 2019-03-02 ENCOUNTER — Telehealth (INDEPENDENT_AMBULATORY_CARE_PROVIDER_SITE_OTHER): Payer: Self-pay | Admitting: *Deleted

## 2019-03-02 NOTE — Telephone Encounter (Signed)
I Called the patient and left her a message on her voicemail that her iron saturation and ferritin levels and hemoglobin were slightly low.  I recommend that she take a Flintstone vitamin with iron.  Advised to call me next week to further review her lab results.

## 2019-03-02 NOTE — Telephone Encounter (Signed)
This is from [place] message: Hello! I had lab work on Monday of this week and was wondering about any results

## 2019-03-02 NOTE — Telephone Encounter (Signed)
Patient called regarding test results  -  Ph# [phone]

## 2019-03-02 NOTE — Telephone Encounter (Signed)
Await heme slides.  Patient is to have an EGD and colonoscopy in 6 months, see last office visit

## 2019-03-02 NOTE — Telephone Encounter (Signed)
See phone note

## 2019-03-03 DIAGNOSIS — F331 Major depressive disorder, recurrent, moderate: Secondary | ICD-10-CM | POA: Diagnosis not present

## 2019-03-03 DIAGNOSIS — E782 Mixed hyperlipidemia: Secondary | ICD-10-CM | POA: Diagnosis not present

## 2019-03-08 ENCOUNTER — Telehealth (INDEPENDENT_AMBULATORY_CARE_PROVIDER_SITE_OTHER): Payer: Self-pay | Admitting: *Deleted

## 2019-03-08 ENCOUNTER — Encounter (INDEPENDENT_AMBULATORY_CARE_PROVIDER_SITE_OTHER): Payer: Self-pay

## 2019-03-08 NOTE — Telephone Encounter (Signed)
I called the pt, she would like to try the higher dose of linzess 119mcg she requests samples.   Tammy, can you see if we have samples of Linzess 127mcg one tab daily and call pt to pick up, we can give her 2 weeks worth of samples if we have. thx   Pt will call me if her abd pain worsens, she will also try probiotic of choice once daily.

## 2019-03-08 NOTE — Telephone Encounter (Signed)
Good morning. I have enough Linzess to last until Sunday. I had a BM last Friday and Saturday and again this morning. I continue with abdominal pain and bloating. Do I continue the medication?  I got your message regarding my lab work. Thank you. Mary Strong

## 2019-03-09 ENCOUNTER — Telehealth: Payer: Self-pay | Admitting: Nurse Practitioner

## 2019-03-09 NOTE — Telephone Encounter (Signed)
error 

## 2019-03-09 NOTE — Telephone Encounter (Signed)
Samples for 2 weeks - Linzess 145 mcg given and the patient was called and made aware.

## 2019-03-16 ENCOUNTER — Encounter: Payer: Self-pay | Admitting: Neurology

## 2019-03-16 ENCOUNTER — Ambulatory Visit (INDEPENDENT_AMBULATORY_CARE_PROVIDER_SITE_OTHER): Payer: Medicare Other | Admitting: Neurology

## 2019-03-16 ENCOUNTER — Other Ambulatory Visit: Payer: Self-pay

## 2019-03-16 VITALS — BP 127/87 | HR 81 | Temp 97.8°F | Ht 63.0 in | Wt 192.5 lb

## 2019-03-16 DIAGNOSIS — I208 Other forms of angina pectoris: Secondary | ICD-10-CM

## 2019-03-16 DIAGNOSIS — R208 Other disturbances of skin sensation: Secondary | ICD-10-CM | POA: Diagnosis not present

## 2019-03-16 DIAGNOSIS — R5382 Chronic fatigue, unspecified: Secondary | ICD-10-CM | POA: Diagnosis not present

## 2019-03-16 DIAGNOSIS — G35 Multiple sclerosis: Secondary | ICD-10-CM

## 2019-03-16 DIAGNOSIS — R39198 Other difficulties with micturition: Secondary | ICD-10-CM | POA: Diagnosis not present

## 2019-03-16 DIAGNOSIS — R079 Chest pain, unspecified: Secondary | ICD-10-CM | POA: Diagnosis not present

## 2019-03-16 DIAGNOSIS — R0789 Other chest pain: Secondary | ICD-10-CM | POA: Diagnosis not present

## 2019-03-16 DIAGNOSIS — I4510 Unspecified right bundle-branch block: Secondary | ICD-10-CM | POA: Diagnosis not present

## 2019-03-16 MED ORDER — DIAZEPAM 5 MG PO TABS: ORAL_TABLET | ORAL | 5 refills | Status: DC
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
## 2019-03-28 MED ORDER — EZETIMIBE 10 MG PO TABS: 10.0000 mg | ORAL_TABLET | Freq: Every day | ORAL | 1 refills | Status: DC

## 2019-03-28 NOTE — Addendum Note (Signed)
Addended by: Merlene Laughter on: 03/28/2019 11:25 AM   Modules accepted: Orders

## 2019-03-28 NOTE — Telephone Encounter (Signed)
Left message to call office to get clarification on taking crestor and zetia.

## 2019-03-28 NOTE — Telephone Encounter (Signed)
Reports taking crestor 5 mg daily for the past 2 months and did not have any symptoms. After starting zetia on 01/19/2019, she reports haivng symptoms of muscle pain, night sweats, itchy scalp and burning skin after 2 weeks.  Reports stopping crestor and zetia 2 weeks ago and symptoms of muscle pain, night sweats, skin burning and itchy scalp have resolved.  Advised that stopping both at the same time would be difficult to determine which med caused her symptoms. Advised to restart crestor 5 mg and contact our office if her symptoms reoccur. Verbalized understanding.

## 2019-04-03 DIAGNOSIS — E039 Hypothyroidism, unspecified: Secondary | ICD-10-CM | POA: Diagnosis not present

## 2019-04-03 DIAGNOSIS — I10 Essential (primary) hypertension: Secondary | ICD-10-CM | POA: Diagnosis not present

## 2019-05-04 DIAGNOSIS — E782 Mixed hyperlipidemia: Secondary | ICD-10-CM | POA: Diagnosis not present

## 2019-05-04 DIAGNOSIS — I10 Essential (primary) hypertension: Secondary | ICD-10-CM | POA: Diagnosis not present

## 2019-05-16 ENCOUNTER — Encounter (INDEPENDENT_AMBULATORY_CARE_PROVIDER_SITE_OTHER): Payer: Self-pay

## 2019-05-24 ENCOUNTER — Ambulatory Visit (INDEPENDENT_AMBULATORY_CARE_PROVIDER_SITE_OTHER): Payer: Medicare Other | Admitting: Gastroenterology

## 2019-05-24 ENCOUNTER — Encounter (INDEPENDENT_AMBULATORY_CARE_PROVIDER_SITE_OTHER): Payer: Self-pay | Admitting: Gastroenterology

## 2019-05-24 ENCOUNTER — Other Ambulatory Visit: Payer: Self-pay

## 2019-05-24 VITALS — BP 131/83 | HR 88 | Temp 99.3°F | Ht 63.0 in | Wt 192.0 lb

## 2019-05-24 DIAGNOSIS — D509 Iron deficiency anemia, unspecified: Secondary | ICD-10-CM

## 2019-05-24 DIAGNOSIS — K5909 Other constipation: Secondary | ICD-10-CM | POA: Diagnosis not present

## 2019-05-24 DIAGNOSIS — R1310 Dysphagia, unspecified: Secondary | ICD-10-CM

## 2019-05-24 DIAGNOSIS — Z7902 Long term (current) use of antithrombotics/antiplatelets: Secondary | ICD-10-CM

## 2019-05-24 DIAGNOSIS — K219 Gastro-esophageal reflux disease without esophagitis: Secondary | ICD-10-CM | POA: Diagnosis not present

## 2019-05-24 NOTE — Progress Notes (Signed)
Patient profile: Mary Strong is a 59 y.o. female seen for evaluation of constipation and GERD . Last seen in clinic on 02/2019 for constipation. At that B[place]ile [place] started on pantoprazole 40mg  daily and linzess 47mcg.   past medical history significant for coronary artery disease s/p cardiac catheterization with DES to the mid LAD and RCA 01/10/2019 on Plavix and ASA, hyperlipidemia,  Multiple Sclerosis, DM II, sleep apnea, hypothyroidism, neurogenic bladder, arthritis, DJD, anxiety, depression, fatty liver and GERD. S/P cholecystectomy in 2011.  History of Present Illness: Mary Strong is seen today for for follow-up.  She reports doing very well on the Linzess 145 mcg.  Unfortunately her insurance does not cover it so she is only used samples intermittently.  She does get new insurance next month and hopefully will cover better.  With the Linzess she is having regular stools.  Now she is just doing MiraLAX daily is having bowel movement every 3 to 4 days, does have some cramping and bloating with bowel movements.  Feels that Linzess resolved the bloating and cramping sensation.  She denies any blood in stool or black stools.  Stools unchanged when she started Metformin.  No obvious rectal bleeding.  Protonix 40mg  qd significantly helped her heartburn symptoms but she does complain of burping and belching-having burping and belching daily, worse after eating.  She denies any nausea or vomiting.  She does only drink carbonated drinks and we discussed this is likely worsening symptoms. No water, etc. One sweet tea a day. She has had improvement in her chest pain since her stents were placed.  Does continue to have occasional palpitations.  No chest pain with exertion.  No nausea or vomiting.  Some chronic dysphagia in her upper esophageal area to breads and meats.   NSAID use of 600 mg ibuprofen several nights a week, uses to help with stiffness and lower extremity pain. Does take on empty stomach  before bed.  Wt Readings from Last 3 Encounters:  05/24/19 192 lb (87.1 kg)  03/16/19 192 lb 8 oz (87.3 kg)  02/27/19 193 lb 1.6 oz (87.6 kg)     Last Colonoscopy: per patient last colonoscopy 2001 Last Endoscopy: Per patient several years ago   Past Medical History:  Past Medical History:  Diagnosis Date  . Anxiety   . Arthritis   . CAD (coronary artery disease)    Heavily calcified vessels, DES to CTO RCA and DES to mid LAD September 2020  . Constipation   . Depression   . Essential hypertension   . Fatty liver   . GERD (gastroesophageal reflux disease)   . History of migraine   . History of sleep apnea    Currently not using CPAP  . Hypothyroidism   . Multiple sclerosis ([place])   . Neurogenic bladder   . Neuropathy   . Palpitations   . Restless leg syndrome   . Skin cancer    Basal cell cancer on forehead, strong family hx of adenocarcinoma  . Type 2 diabetes mellitus ([place])     Problem List: Patient Active Problem List   Diagnosis Date Noted  . Abdominal pain, epigastric 02/27/2019  . Angina pectoris ([place]) 01/18/2019  . Type 2 diabetes mellitus with complication, without long-term current use of insulin ([place]) 01/18/2019  . HTN (hypertension) 01/18/2019  . Hyperlipidemia, mixed 01/18/2019  . Abnormal cardiac CT angiography   . CAD (coronary artery disease) 01/09/2019  . Abdominal pain 12/26/2018  . Constipation 12/26/2018  . Abdominal  bloating 12/26/2018  . Bowel incontinence 03/30/2018  . Abnormal dreams 09/07/2017  . Urinary dysfunction 02/24/2017  . Thoracic disc herniation 02/24/2017  . Lumbar radiculopathy 02/04/2017  . HNP (herniated nucleus pulposus), lumbar 02/04/2017  . Bilateral shoulder pain 08/03/2016  . Gait disturbance 03/18/2016  . Urinary hesitancy 03/18/2016  . Arthritis of left hip 01/24/2016  . Acute blood loss anemia 12/08/2015  . Osteoarthritis of right hip 12/04/2015  . High risk medication use 09/03/2014  . Low serum vitamin D  09/03/2014  . Vitamin D deficiency 09/03/2014  . Multiple sclerosis ([place]) 05/31/2014  . Spastic diplegia ([place]) 05/31/2014  . Dysesthesia 05/31/2014  . Chronic fatigue 05/31/2014  . Insomnia 05/31/2014  . Depression 05/31/2014    Past Surgical History: Past Surgical History:  Procedure Laterality Date  . CESAREAN SECTION     x 2  . CHOLECYSTECTOMY    . CORONARY ATHERECTOMY N/A 01/18/2019   Procedure: CORONARY ATHERECTOMY;  Surgeon: Martinique, Peter M, MD;  Location: [facility];  Service: Cardiovascular;  Laterality: N/A;  . CORONARY CTO INTERVENTION N/A 01/18/2019   Procedure: CORONARY CTO INTERVENTION;  Surgeon: Martinique, Peter M, MD;  Location: [facility];  Service: Cardiovascular;  Laterality: N/A;  . CORONARY STENT INTERVENTION N/A 01/18/2019   Procedure: CORONARY STENT INTERVENTION;  Surgeon: Martinique, Peter M, MD;  Location: [facility];  Service: Cardiovascular;  Laterality: N/A;  . ENDOMETRIAL ABLATION    . JOINT REPLACEMENT    . LEFT HEART CATH AND CORONARY ANGIOGRAPHY N/A 01/10/2019   Procedure: LEFT HEART CATH AND CORONARY ANGIOGRAPHY;  Surgeon: Belva Crome, MD;  Location: [facility];  Service: Cardiovascular;  Laterality: N/A;  . TONSILLECTOMY    . TOTAL HIP ARTHROPLASTY Right 12/04/2015  . TOTAL HIP ARTHROPLASTY Right 12/04/2015   Procedure: TOTAL HIP ARTHROPLASTY ANTERIOR APPROACH;  Surgeon: Marybelle Killings, MD;  Location: [facility];  Service: Orthopedics;  Laterality: Right;  . TOTAL HIP ARTHROPLASTY Left 01/24/2016   Procedure: TOTAL HIP ARTHROPLASTY ANTERIOR APPROACH;  Surgeon: Marybelle Killings, MD;  Location: [facility];  Service: Orthopedics;  Laterality: Left;  anterior approach  . TUBAL LIGATION      Allergies: Allergies  Allergen Reactions  . Shellfish Allergy Anaphylaxis  . Ciprofloxacin Hives  . Copaxone [Glatiramer Acetate] Hives  . Penicillins Hives and Rash    Bad headaches Did it involve swelling of the face/tongue/throat, SOB, or low BP? No Did  it involve sudden or severe rash/hives, skin peeling, or any reaction on the inside of your mouth or nose? No Did you need to seek medical attention at a hospital or doctor's office? No When did it last happen?6 - 7 years If all above answers are "NO", may proceed with cephalosporin use.   . Vancomycin Itching      Home Medications:  Current Outpatient Medications:  .  aspirin 81 MG tablet, Take 81 mg by mouth daily., Disp: , Rfl:  .  BIOTIN PO, Take 500 mcg by mouth daily., Disp: , Rfl:  .  calcium carbonate (TUMS - DOSED IN MG ELEMENTAL CALCIUM) 500 MG chewable tablet, Chew 3 tablets by mouth daily as needed for indigestion or heartburn., Disp: , Rfl:  .  clopidogrel (PLAVIX) 75 MG tablet, Take 1 tablet (75 mg total) by mouth daily., Disp: 30 tablet, Rfl: 11 .  diazepam (VALIUM) 5 MG tablet, TAKE 1 TABLET BY MOUTH THREE TIMES DAILY AS NEEDED, Disp: 90 tablet, Rfl: 5 .  Dulaglutide (TRULICITY)  1.5 MG/0.5ML SOPN, Inject 1.5 mg into the skin every Saturday. , Disp: , Rfl:  .  DULoxetine (CYMBALTA) 60 MG capsule, Take 1 capsule (60 mg total) by mouth at bedtime., Disp: 30 capsule, Rfl: 11 .  gabapentin (NEURONTIN) 600 MG tablet, TAKE 1 TABLET BY MOUTH THREE TIMES DAILY (Patient taking differently: Take 600 mg by mouth 2 (two) times daily. ), Disp: 90 tablet, Rfl: 5 .  levothyroxine (SYNTHROID) 112 MCG tablet, Take 112 mcg by mouth daily before breakfast., Disp: , Rfl:  .  metFORMIN (GLUCOPHAGE) 500 MG tablet, Take 1 tablet (500 mg total) by mouth 2 (two) times daily., Disp: , Rfl:  .  metoprolol tartrate (LOPRESSOR) 25 MG tablet, Take 1 tablet (25 mg total) by mouth 2 (two) times daily., Disp: , Rfl:  .  mometasone (ELOCON) 0.1 % cream, Apply 1 application topically daily as needed (psoriasis). , Disp: , Rfl:  .  nitroGLYCERIN (NITROSTAT) 0.4 MG SL tablet, Place 1 tablet (0.4 mg total) under the tongue every 5 (five) minutes as needed. (Patient taking differently: Place 0.4 mg under the  tongue every 5 (five) minutes as needed for chest pain. ), Disp: 25 tablet, Rfl: 2 .  pantoprazole (PROTONIX) 40 MG tablet, Take 40 mg by mouth daily. , Disp: , Rfl:  .  rosuvastatin (CRESTOR) 5 MG tablet, Take 1 tablet (5 mg total) by mouth every morning. Saturday evening (Patient taking differently: Take 5 mg by mouth every morning. ), Disp: 60 tablet, Rfl: 11 .  linaclotide (LINZESS) 145 MCG CAPS capsule, Take 145 mcg by mouth daily before breakfast., Disp: , Rfl:    Family History: family history includes Arthritis/Rheumatoid in her mother; Multiple sclerosis in her mother; Psoriasis in her father.    Social History:   reports that she has never smoked. She has never used smokeless tobacco. She reports current alcohol use. She reports current drug use. Drug: Marijuana.   Review of Systems: Constitutional: Denies weight loss/weight gain  Eyes: No changes in vision. ENT: No oral lesions, sore throat.  GI: see HPI.  Heme/Lymph: No easy bruising.  CV: No chest pain.  GU: No hematuria.  Integumentary: No rashes.  Neuro: No headaches.  Psych: No depression/anxiety.  Endocrine: No heat/cold intolerance.  Allergic/Immunologic: No urticaria.  Resp: No cough, SOB.  Musculoskeletal: No joint swelling.    Physical Examination: BP 131/83 (BP Location: Right Arm, Patient Position: Sitting, Cuff Size: Large)   Pulse 88   Temp 99.3 F (37.4 C) (Temporal)   Ht 5\' 3"  (1.6 m)   Wt 192 lb (87.1 kg)   BMI 34.01 kg/m  Gen: NAD, alert and oriented x 4 HEENT: PEERLA, EOMI, Neck: supple, no JVD Chest: CTA bilaterally, no wheezes, crackles, or other adventitious sounds CV: RRR, no m/g/c/r Abd: soft, NT, ND, +BS in all four quadrants; no HSM, guarding, ridigity, or rebound tenderness RECTAL - hemoccult negative brown stool Ext: no edema, well perfused with 2+ pulses, Skin: no rash or lesions noted on observed skin Lymph: no noted LAD  Data Reviewed: Abdominal/pelvic CT 02/20/2019: 1. No  findings to explain the patient's given clinical history, other than constipation. 2. Mild splenic enlargement. 3. Coronary artery calcification.  Labs October 2020-ferritin 9, iron 46, low percent sat, lipase normal, celiac normal CRP normal, CMP normal, CBC with hemoglobin 11.5, MCV 76  Assessment/Plan: Ms. Bodin is a 59 y.o. female    Nakhiya was seen today for follow-up.  Diagnoses and all orders for this visit:  Iron  deficiency anemia, unspecified iron deficiency anemia type -     CBC with Differential -     Fe+TIBC+Fer  Antiplatelet or antithrombotic long-term use  Chronic GERD  Dysphagia, unspecified type  Chronic constipation    1.  IDA-last labs with a ferritin 9 and a minimally low hemoglobin that was microcytic.  She has not started iron supplement.  Will recheck labs today but will likely need in future.  She was Hemoccult negative in the office today and does not see obvious GI bleeding.  EGD/ colonoscopy recommended, she had a heart stent placed September 2020-she has discussed with cardiology ability to hold Plavix beginning 6 months after stent placement.  We will tentatively schedule for procedures in April 2021.  We will request cardiac clearance to hold antiplatelet prior. Hold high dose ibuprofen prior to procedures.   2.  Constipation-improved with Linzess, I did give some samples of 145 micrograms a day.  Hopefully her new insurance starting next month will approve this since she did well on it.  Colonoscopy as above to exclude structural abnormality.  We also reviewed she can increase MiraLAX.  3.  GERD-improved with Protonix.  Does have some refractory belching, likely secondary to only drinking carbonated beverages.  Recommend she decrease this  4.  Dysphagia-EGD to exclude stricture, does have MS and may have dysmotility contributing  Patient denies CP, SOB I discussed the risks and benefits of procedure including bleeding, perforation, infection,  missed lesions, medication reactions and possible hospitalization or surgery if complications. All questions answered.    >40 min total pt care time, >50% face to face  I personally performed the service, non-incident to. (WP)  Laurine Blazer, [facility]

## 2019-05-24 NOTE — Patient Instructions (Signed)
Continue miralax daily - can increase to twice a day if needed. Continue protonix 40mg  in morning. We will schedule endoscopy/colonoscopy for evaluation in April 2021. Call if would like to send prescription for linzess in 07/2019.

## 2019-05-25 ENCOUNTER — Ambulatory Visit (INDEPENDENT_AMBULATORY_CARE_PROVIDER_SITE_OTHER): Payer: Medicare Other | Admitting: Gastroenterology

## 2019-05-25 LAB — CBC WITH DIFFERENTIAL/PLATELET
Absolute Monocytes: 539 cells/uL (ref 200–950)
Basophils Absolute: 8 cells/uL (ref 0–200)
Basophils Relative: 0.1 %
Eosinophils Absolute: 77 cells/uL (ref 15–500)
Eosinophils Relative: 1 %
HCT: 38.2 % (ref 35.0–45.0)
Hemoglobin: 12 g/dL (ref 11.7–15.5)
Lymphs Abs: 1871 cells/uL (ref 850–3900)
MCH: 22.6 pg — ABNORMAL LOW (ref 27.0–33.0)
MCHC: 31.4 g/dL — ABNORMAL LOW (ref 32.0–36.0)
MCV: 72.1 fL — ABNORMAL LOW (ref 80.0–100.0)
MPV: 11.1 fL (ref 7.5–12.5)
Monocytes Relative: 7 %
Neutro Abs: 5205 cells/uL (ref 1500–7800)
Neutrophils Relative %: 67.6 %
Platelets: 282 10*3/uL (ref 140–400)
RBC: 5.3 10*6/uL — ABNORMAL HIGH (ref 3.80–5.10)
RDW: 16.8 % — ABNORMAL HIGH (ref 11.0–15.0)
Total Lymphocyte: 24.3 %
WBC: 7.7 10*3/uL (ref 3.8–10.8)

## 2019-05-25 LAB — IRON,TIBC AND FERRITIN PANEL
%SAT: 10 % (calc) — ABNORMAL LOW (ref 16–45)
Ferritin: 9 ng/mL — ABNORMAL LOW (ref 16–232)
Iron: 39 ug/dL — ABNORMAL LOW (ref 45–160)
TIBC: 408 mcg/dL (calc) (ref 250–450)

## 2019-06-02 DIAGNOSIS — E1165 Type 2 diabetes mellitus with hyperglycemia: Secondary | ICD-10-CM | POA: Diagnosis not present

## 2019-06-02 DIAGNOSIS — K5900 Constipation, unspecified: Secondary | ICD-10-CM | POA: Diagnosis not present

## 2019-06-02 DIAGNOSIS — E1142 Type 2 diabetes mellitus with diabetic polyneuropathy: Secondary | ICD-10-CM | POA: Diagnosis not present

## 2019-06-09 DIAGNOSIS — E119 Type 2 diabetes mellitus without complications: Secondary | ICD-10-CM | POA: Diagnosis not present

## 2019-06-09 DIAGNOSIS — K219 Gastro-esophageal reflux disease without esophagitis: Secondary | ICD-10-CM | POA: Diagnosis not present

## 2019-06-09 DIAGNOSIS — E782 Mixed hyperlipidemia: Secondary | ICD-10-CM | POA: Diagnosis not present

## 2019-06-09 DIAGNOSIS — I10 Essential (primary) hypertension: Secondary | ICD-10-CM | POA: Diagnosis not present

## 2019-06-09 DIAGNOSIS — R5383 Other fatigue: Secondary | ICD-10-CM | POA: Diagnosis not present

## 2019-06-13 DIAGNOSIS — E1142 Type 2 diabetes mellitus with diabetic polyneuropathy: Secondary | ICD-10-CM | POA: Diagnosis not present

## 2019-06-13 DIAGNOSIS — K7581 Nonalcoholic steatohepatitis (NASH): Secondary | ICD-10-CM | POA: Diagnosis not present

## 2019-06-13 DIAGNOSIS — K219 Gastro-esophageal reflux disease without esophagitis: Secondary | ICD-10-CM | POA: Diagnosis not present

## 2019-06-13 DIAGNOSIS — Z6834 Body mass index (BMI) 34.0-34.9, adult: Secondary | ICD-10-CM | POA: Diagnosis not present

## 2019-06-13 DIAGNOSIS — E782 Mixed hyperlipidemia: Secondary | ICD-10-CM | POA: Diagnosis not present

## 2019-06-13 DIAGNOSIS — F331 Major depressive disorder, recurrent, moderate: Secondary | ICD-10-CM | POA: Diagnosis not present

## 2019-06-13 DIAGNOSIS — G35 Multiple sclerosis: Secondary | ICD-10-CM | POA: Diagnosis not present

## 2019-06-13 DIAGNOSIS — G6289 Other specified polyneuropathies: Secondary | ICD-10-CM | POA: Diagnosis not present

## 2019-06-13 DIAGNOSIS — E039 Hypothyroidism, unspecified: Secondary | ICD-10-CM | POA: Diagnosis not present

## 2019-06-14 ENCOUNTER — Other Ambulatory Visit (INDEPENDENT_AMBULATORY_CARE_PROVIDER_SITE_OTHER): Payer: Self-pay | Admitting: *Deleted

## 2019-06-14 DIAGNOSIS — D509 Iron deficiency anemia, unspecified: Secondary | ICD-10-CM

## 2019-06-14 DIAGNOSIS — R1310 Dysphagia, unspecified: Secondary | ICD-10-CM

## 2019-06-14 DIAGNOSIS — K219 Gastro-esophageal reflux disease without esophagitis: Secondary | ICD-10-CM

## 2019-06-14 DIAGNOSIS — K5909 Other constipation: Secondary | ICD-10-CM

## 2019-06-28 ENCOUNTER — Encounter (INDEPENDENT_AMBULATORY_CARE_PROVIDER_SITE_OTHER): Payer: Self-pay | Admitting: *Deleted

## 2019-06-28 ENCOUNTER — Telehealth (INDEPENDENT_AMBULATORY_CARE_PROVIDER_SITE_OTHER): Payer: Self-pay | Admitting: *Deleted

## 2019-06-28 ENCOUNTER — Other Ambulatory Visit (INDEPENDENT_AMBULATORY_CARE_PROVIDER_SITE_OTHER): Payer: Self-pay | Admitting: Gastroenterology

## 2019-06-28 MED ORDER — PLENVU 140 G PO SOLR: 1.0000 | Freq: Once | ORAL | 0 refills | Status: DC

## 2019-06-28 NOTE — Telephone Encounter (Signed)
Patient is scheduled for colonoscopy & endoscopy 08/23/19 - she needs to stop ASA 2 days prior and PLavix 5 days prior - please advise if ok to stop, thanks

## 2019-06-28 NOTE — Telephone Encounter (Signed)
Thank you :)

## 2019-06-28 NOTE — Telephone Encounter (Signed)
Patient needs plenvu (copay card) TCS/EGD sch'd 4/21

## 2019-06-28 NOTE — Telephone Encounter (Signed)
     Dr. Domenic Polite is currently out of the office. In reviewing the patient's chart, she underwent CTO with stent placement to the RCA and atherectomy and stent stent placement to the LAD in 01/2019.  Ideally, she should be on uninterrupted DAPT for 1 year but at times this is held if needed once greater than 6 months out from stent placement. Given that her procedure is scheduled in 08/2019, this would meet that timeline. Will leave for Dr. Domenic Polite to review and give final recommendations upon his return.  Signed, Erma Heritage, PA-C 06/28/2019, 11:18 AM Pager: [phone]

## 2019-06-28 NOTE — Telephone Encounter (Signed)
Warren sent note -   NDC not covered by insurance.Insurance covers [employer].  Will need to check alternative prep and send new instructions accordingly. Thanks.

## 2019-06-28 NOTE — Telephone Encounter (Signed)
Patient was mailed coupon card for her prep

## 2019-06-30 DIAGNOSIS — E1165 Type 2 diabetes mellitus with hyperglycemia: Secondary | ICD-10-CM | POA: Diagnosis not present

## 2019-06-30 DIAGNOSIS — I10 Essential (primary) hypertension: Secondary | ICD-10-CM | POA: Diagnosis not present

## 2019-06-30 DIAGNOSIS — E039 Hypothyroidism, unspecified: Secondary | ICD-10-CM | POA: Diagnosis not present

## 2019-07-01 NOTE — Telephone Encounter (Signed)
I will ask Dr. Doug Sou opinion on this issue.  She underwent staged DES intervention to the RCA and LAD in September 2020.  No ACS at that time, diagnosis of CAD made after abnormal cardiac CTA.  Question is whether she needs to complete an entire year of uninterrupted DAPT, or could she come off temporarily in April for endoscopy having completed 6 months of DAPT treatment.

## 2019-07-02 NOTE — Telephone Encounter (Signed)
She had complex anatomy with a long stent in the LAD. I guess the question is why she is having these procedures now. If she is having active bleeding then by all means hold the P2y12 for 5 days and proceed. If this is more for surveillance then I would hold off until September. She should not stop ASA regardless.   Kajuan Guyton Martinique MD, [facility]

## 2019-07-03 NOTE — Telephone Encounter (Signed)
Forwarded to Dr.Rehman for review. 

## 2019-07-03 NOTE — Telephone Encounter (Signed)
Thank you Collier Salina, I appreciate your input.  I will forward this information to the GI office.

## 2019-07-12 ENCOUNTER — Other Ambulatory Visit: Payer: Self-pay | Admitting: *Deleted

## 2019-07-12 MED ORDER — ROSUVASTATIN CALCIUM 5 MG PO TABS: 5.0000 mg | ORAL_TABLET | Freq: Every morning | ORAL | 1 refills | Status: DC

## 2019-07-12 MED ORDER — CLOPIDOGREL BISULFATE 75 MG PO TABS: 75.0000 mg | ORAL_TABLET | Freq: Every day | ORAL | 1 refills | Status: DC

## 2019-07-13 ENCOUNTER — Telehealth: Payer: Self-pay | Admitting: Neurology

## 2019-07-13 MED ORDER — GABAPENTIN 600 MG PO TABS: 600.0000 mg | ORAL_TABLET | Freq: Three times a day (TID) | ORAL | 5 refills | Status: DC

## 2019-07-13 MED ORDER — DIAZEPAM 5 MG PO TABS: ORAL_TABLET | ORAL | 5 refills | Status: DC

## 2019-07-13 NOTE — Telephone Encounter (Signed)
Called [facility]/[place] at [phone] and cx rx diazepam and gabapentin on file there. Spoke with Judson Roch.   Checked drug registry. She last refilled diazepam 04/09/19 #90. Last seen 03/16/19 and next f/u 09/13/18.

## 2019-07-13 NOTE — Telephone Encounter (Signed)
1) Medication(s) Requested (by name): diazepam (VALIUM) 5 MG tablet  gabapentin (NEURONTIN) 600 MG tablet  2) Pharmacy of Choice: [facility]   Rep with [facility]  called stating pt is requesting [facility] medication [facility]

## 2019-07-15 ENCOUNTER — Other Ambulatory Visit: Payer: Self-pay | Admitting: Neurology

## 2019-07-15 NOTE — Telephone Encounter (Signed)
Please disregard last note.  Wrong patient  I called patient but there was no answer.  I would recommend holding off EGD and colonoscopy until September unless her hemoglobin drops. He is asked patient to take ferrous sulfate 325 mg every day. Will check hemoglobin in 1 month.

## 2019-07-15 NOTE — Telephone Encounter (Signed)
Patient called.  Message left on answering service. I do not believe her hoarseness is due to GERD.  She is on therapy and has no other symptoms. Advised the patient to follow-up with ENT specialist again.  She was last seen in 2016. Patient will call office if she has any questions.

## 2019-07-17 ENCOUNTER — Other Ambulatory Visit (INDEPENDENT_AMBULATORY_CARE_PROVIDER_SITE_OTHER): Payer: Self-pay | Admitting: *Deleted

## 2019-07-17 DIAGNOSIS — D509 Iron deficiency anemia, unspecified: Secondary | ICD-10-CM

## 2019-07-17 NOTE — Telephone Encounter (Signed)
I would recommend holding off EGD and colonoscopy until September unless her hemoglobin drops. He is asked patient to take ferrous sulfate 325 mg every day. Will check hemoglobin in 1 month.   Patient was called and the above message was left. Hbg noted for 1 month , the patient will be sent a message as a reminder.

## 2019-07-19 ENCOUNTER — Other Ambulatory Visit: Payer: Self-pay | Admitting: Neurology

## 2019-07-24 ENCOUNTER — Other Ambulatory Visit (INDEPENDENT_AMBULATORY_CARE_PROVIDER_SITE_OTHER): Payer: Self-pay | Admitting: *Deleted

## 2019-07-24 DIAGNOSIS — D509 Iron deficiency anemia, unspecified: Secondary | ICD-10-CM

## 2019-07-27 DIAGNOSIS — Z23 Encounter for immunization: Secondary | ICD-10-CM | POA: Diagnosis not present

## 2019-08-02 DIAGNOSIS — K219 Gastro-esophageal reflux disease without esophagitis: Secondary | ICD-10-CM | POA: Diagnosis not present

## 2019-08-02 DIAGNOSIS — I10 Essential (primary) hypertension: Secondary | ICD-10-CM | POA: Diagnosis not present

## 2019-08-17 DIAGNOSIS — D509 Iron deficiency anemia, unspecified: Secondary | ICD-10-CM | POA: Diagnosis not present

## 2019-08-18 LAB — HEMOGLOBIN AND HEMATOCRIT, BLOOD
HCT: 35 % (ref 35.0–45.0)
Hemoglobin: 10.9 g/dL — ABNORMAL LOW (ref 11.7–15.5)

## 2019-08-21 ENCOUNTER — Other Ambulatory Visit (HOSPITAL_COMMUNITY)
Admission: RE | Admit: 2019-08-21 | Discharge: 2019-08-21 | Disposition: A | Discharge status: Home / Self Care | Payer: Medicare Other | Source: Ambulatory Visit | Attending: Internal Medicine | Admitting: Internal Medicine

## 2019-08-21 ENCOUNTER — Other Ambulatory Visit: Payer: Self-pay

## 2019-08-22 ENCOUNTER — Encounter: Payer: Self-pay | Admitting: Cardiology

## 2019-08-22 ENCOUNTER — Telehealth (INDEPENDENT_AMBULATORY_CARE_PROVIDER_SITE_OTHER): Payer: Medicare Other | Admitting: Cardiology

## 2019-08-22 VITALS — Ht 63.0 in | Wt 195.0 lb

## 2019-08-22 DIAGNOSIS — I25119 Atherosclerotic heart disease of native coronary artery with unspecified angina pectoris: Secondary | ICD-10-CM | POA: Diagnosis not present

## 2019-08-22 DIAGNOSIS — Z6835 Body mass index (BMI) 35.0-35.9, adult: Secondary | ICD-10-CM | POA: Diagnosis not present

## 2019-08-22 DIAGNOSIS — D179 Benign lipomatous neoplasm, unspecified: Secondary | ICD-10-CM | POA: Diagnosis not present

## 2019-08-22 DIAGNOSIS — T148XXA Other injury of unspecified body region, initial encounter: Secondary | ICD-10-CM | POA: Diagnosis not present

## 2019-08-22 DIAGNOSIS — E782 Mixed hyperlipidemia: Secondary | ICD-10-CM

## 2019-08-22 NOTE — Patient Instructions (Signed)

## 2019-08-22 NOTE — Progress Notes (Signed)
Virtual Visit via Video Note   This visit type was conducted due to national recommendations for restrictions regarding the COVID-19 Pandemic (e.g. social distancing) in an effort to limit this patient's exposure and mitigate transmission in our community.  Due to her co-morbid illnesses, this patient is at least at moderate risk for complications without adequate follow up.  This format is felt to be most appropriate for this patient at this time.  All issues noted in this document were discussed and addressed.  A limited physical exam was performed with this format.  Please refer to the patient's chart for her consent to telehealth for [facility]   The patient was identified using 2 identifiers.  Date:  08/22/2019   ID:  Mary Strong, DOB Nov 22, 1960, MRN [id number]  Patient Location: Home Provider Location: Office  PCP:  Caryl Bis, MD  Cardiologist:  Rozann Lesches, MD Electrophysiologist:  None   Evaluation Performed:  Follow-Up Visit  Chief Complaint:   Cardiac follow-up  History of Present Illness:    Mary Strong is a 59 y.o. female last seen in October 2020.  We communicated by video conferencing today.  She states that she has been doing reasonably well.  She has only used 2 nitroglycerin tablets since last assessment, no progressive exertional symptomatology.  I reviewed her medications which are outlined below and stable from a cardiac perspective.  She does not report any bleeding problems on dual antiplatelet therapy.  She is deferring elective colonoscopy until September at this point.  Lipids have been very well controlled, last LDL was 30.  She is taking Crestor 5 mg every other day.   Past Medical History:  Diagnosis Date  . Anxiety   . Arthritis   . CAD (coronary artery disease)    Heavily calcified vessels, DES to CTO RCA and DES to mid LAD September 2020  . Constipation   . Depression   . Essential hypertension   . Fatty liver   .  GERD (gastroesophageal reflux disease)   . History of migraine   . History of sleep apnea    Currently not using CPAP  . Hypothyroidism   . Multiple sclerosis ([place])   . Neurogenic bladder   . Neuropathy   . Palpitations   . Restless leg syndrome   . Skin cancer    Basal cell cancer on forehead, strong family hx of adenocarcinoma  . Type 2 diabetes mellitus ([place])    Past Surgical History:  Procedure Laterality Date  . CESAREAN SECTION     x 2  . CHOLECYSTECTOMY    . CORONARY ATHERECTOMY N/A 01/18/2019   Procedure: CORONARY ATHERECTOMY;  Surgeon: Martinique, Peter M, MD;  Location: [place] [facility];  Service: Cardiovascular;  Laterality: N/A;  . CORONARY CTO INTERVENTION N/A 01/18/2019   Procedure: CORONARY CTO INTERVENTION;  Surgeon: Martinique, Peter M, MD;  Location: [place] [facility];  Service: Cardiovascular;  Laterality: N/A;  . CORONARY STENT INTERVENTION N/A 01/18/2019   Procedure: CORONARY STENT INTERVENTION;  Surgeon: Martinique, Peter M, MD;  Location: [place] [facility];  Service: Cardiovascular;  Laterality: N/A;  . ENDOMETRIAL ABLATION    . JOINT REPLACEMENT    . LEFT HEART CATH AND CORONARY ANGIOGRAPHY N/A 01/10/2019   Procedure: LEFT HEART CATH AND CORONARY ANGIOGRAPHY;  Surgeon: Belva Crome, MD;  Location: [place] [facility];  Service: Cardiovascular;  Laterality: N/A;  . TONSILLECTOMY    . TOTAL HIP ARTHROPLASTY Right 12/04/2015  .  TOTAL HIP ARTHROPLASTY Right 12/04/2015   Procedure: TOTAL HIP ARTHROPLASTY ANTERIOR APPROACH;  Surgeon: Marybelle Killings, MD;  Location: [place];  Service: Orthopedics;  Laterality: Right;  . TOTAL HIP ARTHROPLASTY Left 01/24/2016   Procedure: TOTAL HIP ARTHROPLASTY ANTERIOR APPROACH;  Surgeon: Marybelle Killings, MD;  Location: [place];  Service: Orthopedics;  Laterality: Left;  anterior approach  . TUBAL LIGATION       Current Meds  Medication Sig  . aspirin 81 MG tablet Take 81 mg by mouth daily.  Marland Kitchen BIOTIN PO Take 500 mcg by mouth daily.  . calcium  carbonate (TUMS - DOSED IN MG ELEMENTAL CALCIUM) 500 MG chewable tablet Chew 3 tablets by mouth daily as needed for indigestion or heartburn.  . clopidogrel (PLAVIX) 75 MG tablet Take 1 tablet (75 mg total) by mouth daily.  . diazepam (VALIUM) 5 MG tablet TAKE 1 TABLET BY MOUTH THREE TIMES DAILY AS NEEDED  . Dulaglutide (TRULICITY) 1.5 0000000 SOPN Inject 1.5 mg into the skin every Saturday.   . DULoxetine (CYMBALTA) 60 MG capsule Take 1 capsule (60 mg total) by mouth at bedtime.  . gabapentin (NEURONTIN) 600 MG tablet Take 1 tablet (600 mg total) by mouth 3 (three) times daily.  Marland Kitchen levothyroxine (SYNTHROID) 112 MCG tablet Take 112 mcg by mouth daily before breakfast.  . linaclotide (LINZESS) 145 MCG CAPS capsule Take 145 mcg by mouth daily before breakfast.  . metFORMIN (GLUCOPHAGE) 500 MG tablet Take 1 tablet (500 mg total) by mouth 2 (two) times daily.  . metoprolol tartrate (LOPRESSOR) 25 MG tablet Take 1 tablet (25 mg total) by mouth 2 (two) times daily.  . mometasone (ELOCON) 0.1 % cream Apply 1 application topically daily as needed (psoriasis).   . nitroGLYCERIN (NITROSTAT) 0.4 MG SL tablet Place 1 tablet (0.4 mg total) under the tongue every 5 (five) minutes as needed. (Patient taking differently: Place 0.4 mg under the tongue every 5 (five) minutes as needed for chest pain. )  . pantoprazole (PROTONIX) 40 MG tablet Take 40 mg by mouth daily.   . rosuvastatin (CRESTOR) 5 MG tablet Take 1 tablet (5 mg total) by mouth every morning.     Allergies:   Shellfish allergy, Ciprofloxacin, Copaxone [glatiramer acetate], Penicillins, and Vancomycin   ROS:   Reports a suspicious skin lesion on forehead, also lump within her vagina that she discovered recently.  She is seeing Dr. Quillian Quince today for further evaluation.  Prior CV studies:   The following studies were reviewed today:  Cardiac catheterization 01/10/2019:  Heavy three-vessel coronary calcification.  Normal left main.  Mid LAD within  a heavily calcified segment 50 to 60% followed by 85% tandem stenoses. Supplies collaterals to RCA via septal perforators and apical segment.  Circumflex gives 3 obtuse marginal branches. Second obtuse marginal contains mid vessel 40% narrowing. Supplies collaterals to distal RCA.  Total occlusion in the mid vessel within a heavily calcified segment. Total occlusion appears to be a long segment.  Normal left ventricular size and function. EF 60%.  RECOMMENDATIONS:   Aggressive preventive therapy driving LDL down to 55 with PCSK9 therapy since she seems to be intolerant to statin therapy. Also consider SGLT2 therapy for diabetes.  Anti-ischemic therapy as needed to control symptoms.  Consider surgical revascularization given diabetes, severe hyperlipidemia, significant involvement of LAD (FFR by CTA). The other option would be RCA CTO PCI followed in a staged manner by LAD orbital atherectomy and stenting. Since the RCA is well collateralized, and the  LAD is involved in the mid segment, I would recommend percutaneous intervention prior to subjecting her to surgery.  PCI 01/18/2019:  Prox LAD to Mid LAD lesion is 55% stenosed.  Mid LAD lesion is 85% stenosed.  A drug-eluting stent was successfully placed using a STENT SYNERGY DES 3X38.  Post intervention, there is a 0% residual stenosis.  Prox RCA lesion is 100% stenosed.  A drug-eluting stent was successfully placed using a STENT SYNERGY DES 3.5X20.  Post intervention, there is a 0% residual stenosis.  1. Successful CTO PCI of the proximal RCA with rotational atherectomy and DES x 1 2. Successful PCI of the mid LAD with rotational atherectomy and DES x 1.   Plan: DAPT for one year. Will observe overnight. Anticipate DC in am if stable.   Labs/Other Tests and Data Reviewed:    EKG:  An ECG dated 01/19/2019 was personally reviewed today and demonstrated:  Sinus rhythm with right bundle branch block and anterolateral T  wave inversions.  Recent Labs: 02/27/2019: ALT 13; BUN 12; Creat 1.02; Potassium 4.5; Sodium 140 05/24/2019: Platelets 282 08/17/2019: Hemoglobin 10.9  Over 2020: Cholesterol 85, triglycerides 146, HDL 30, LDL 30  Wt Readings from Last 3 Encounters:  08/22/19 195 lb (88.5 kg)  05/24/19 192 lb (87.1 kg)  03/16/19 192 lb 8 oz (87.3 kg)     Objective:    Vital Signs:  Ht 5\' 3"  (1.6 m)   Wt 195 lb (88.5 kg)   BMI 34.54 kg/m    Unable to obtain vital signs today. Patient spoke in full sentences, not short of breath. No audible wheezing or coughing.  ASSESSMENT & PLAN:    1.  CAD, multivessel distribution with heavy calcification.  She is status post DES to CTO RCA and mid LAD in September 2020.  Reports no progressive angina symptoms on medical therapy.  Continue aspirin and Plavix through September if possible, Lopressor, and low-dose statin which she is tolerating at this time.  2.  Mixed hyperlipidemia, on Crestor 5 mg every other day.  Last LDL 30.   Time:   Today, I have spent 6 minutes with the patient with telehealth technology discussing the above problems.     Medication Adjustments/Labs and Tests Ordered: Current medicines are reviewed at length with the patient today.  Concerns regarding medicines are outlined above.   Tests Ordered: No orders of the defined types were placed in this encounter.   Medication Changes: No orders of the defined types were placed in this encounter.   Follow Up:  In Person 6 months in the [place] office.  Signed, Rozann Lesches, MD  08/22/2019 11:44 AM    [place]

## 2019-08-23 ENCOUNTER — Encounter (HOSPITAL_COMMUNITY): Admission: RE | Payer: Self-pay | Source: Home / Self Care

## 2019-08-23 ENCOUNTER — Other Ambulatory Visit (INDEPENDENT_AMBULATORY_CARE_PROVIDER_SITE_OTHER): Payer: Self-pay | Admitting: *Deleted

## 2019-08-23 ENCOUNTER — Ambulatory Visit (HOSPITAL_COMMUNITY): Admission: RE | Admit: 2019-08-23 | Payer: Medicare Other | Source: Home / Self Care | Admitting: Internal Medicine

## 2019-08-23 DIAGNOSIS — D509 Iron deficiency anemia, unspecified: Secondary | ICD-10-CM

## 2019-08-23 SURGERY — COLONOSCOPY
Anesthesia: Moderate Sedation

## 2019-08-23 NOTE — Progress Notes (Signed)
Order complete. 

## 2019-08-24 DIAGNOSIS — Z23 Encounter for immunization: Secondary | ICD-10-CM | POA: Diagnosis not present

## 2019-08-29 ENCOUNTER — Encounter (INDEPENDENT_AMBULATORY_CARE_PROVIDER_SITE_OTHER): Payer: Self-pay | Admitting: *Deleted

## 2019-08-29 ENCOUNTER — Other Ambulatory Visit (INDEPENDENT_AMBULATORY_CARE_PROVIDER_SITE_OTHER): Payer: Self-pay | Admitting: *Deleted

## 2019-08-29 DIAGNOSIS — D509 Iron deficiency anemia, unspecified: Secondary | ICD-10-CM

## 2019-09-01 DIAGNOSIS — I10 Essential (primary) hypertension: Secondary | ICD-10-CM | POA: Diagnosis not present

## 2019-09-01 DIAGNOSIS — E7849 Other hyperlipidemia: Secondary | ICD-10-CM | POA: Diagnosis not present

## 2019-09-01 DIAGNOSIS — E1165 Type 2 diabetes mellitus with hyperglycemia: Secondary | ICD-10-CM | POA: Diagnosis not present

## 2019-09-13 ENCOUNTER — Other Ambulatory Visit: Payer: Self-pay

## 2019-09-13 ENCOUNTER — Ambulatory Visit (INDEPENDENT_AMBULATORY_CARE_PROVIDER_SITE_OTHER): Payer: Medicare Other | Admitting: Family Medicine

## 2019-09-13 ENCOUNTER — Encounter: Payer: Self-pay | Admitting: Family Medicine

## 2019-09-13 ENCOUNTER — Telehealth: Payer: Self-pay | Admitting: Family Medicine

## 2019-09-13 VITALS — BP 141/82 | HR 98 | Temp 97.5°F | Ht 63.0 in | Wt 198.0 lb

## 2019-09-13 DIAGNOSIS — R52 Pain, unspecified: Secondary | ICD-10-CM | POA: Diagnosis not present

## 2019-09-13 DIAGNOSIS — G801 Spastic diplegic cerebral palsy: Secondary | ICD-10-CM | POA: Diagnosis not present

## 2019-09-13 DIAGNOSIS — E559 Vitamin D deficiency, unspecified: Secondary | ICD-10-CM

## 2019-09-13 DIAGNOSIS — R208 Other disturbances of skin sensation: Secondary | ICD-10-CM

## 2019-09-13 DIAGNOSIS — G35 Multiple sclerosis: Secondary | ICD-10-CM

## 2019-09-13 DIAGNOSIS — R5382 Chronic fatigue, unspecified: Secondary | ICD-10-CM

## 2019-09-13 NOTE — Telephone Encounter (Signed)
Medicare order sent to GI. No auth they will reach out to the patient to schedule.  

## 2019-09-13 NOTE — Patient Instructions (Signed)
We will continue current treatment plan. I will discuss the case with Dr Felecia Shelling and decide how to proceed. I understand you do not wish to restart DMT unless we have to. We can consider prednisone as well but I understand you hesitation.   Stay well hydrated. Well balanced diet and try to be active.   Follow up pending MRI, may see you sooner with Dr Felecia Shelling if needed.   Multiple Sclerosis Multiple sclerosis (MS) is a disease of the brain, spinal cord, and optic nerves (central nervous system). It causes the body's disease-fighting (immune) system to destroy the protective covering (myelin sheath) around nerves in the brain. When this happens, signals (nerve impulses) going to and from the brain and spinal cord do not get sent properly or may not get sent at all. There are several types of MS:  Relapsing-remitting MS. This is the most common type. This causes sudden attacks of symptoms. After an attack, you may recover completely until the next attack, or some symptoms may remain permanently.  Secondary progressive MS. This usually develops after the onset of relapsing-remitting MS. Similar to relapsing-remitting MS, this type also causes sudden attacks of symptoms. Attacks may be less frequent, but symptoms slowly get worse (progress) over time.  Primary progressive MS. This causes symptoms that steadily progress over time. This type of MS does not cause sudden attacks of symptoms. The age of onset of MS varies, but it often develops between 71-29 years of age. MS is a lifelong (chronic) condition. There is no cure, but treatment can help slow down the progression of the disease. What are the causes? The cause of this condition is not known. What increases the risk? You are more likely to develop this condition if:  You are a woman.  You have a relative with MS. However, the condition is not passed from parent to child (inherited).  You have a lack (deficiency) of vitamin D.  You  smoke. MS is more common in the [place] than in the [place]. What are the signs or symptoms? Relapsing-remitting and secondary progressive MS cause symptoms to occur in episodes or attacks that may last weeks to months. There may be long periods between attacks in which there are almost no symptoms. Primary progressive MS causes symptoms to steadily progress after they develop. Symptoms of MS vary because of the many different ways it affects the central nervous system. The main symptoms include:  Vision problems and eye pain.  Numbness.  Weakness.  Inability to move your arms, hands, feet, or legs (paralysis).  Balance problems.  Shaking that you cannot control (tremors).  Muscle spasms.  Problems with thinking (cognitive changes). MS can also cause symptoms that are associated with the disease, but are not always the direct result of an MS attack. They may include:  Inability to control urination or bowel movements (incontinence).  Headaches.  Fatigue.  Inability to tolerate heat.  Emotional changes.  Depression.  Pain. How is this diagnosed? This condition is diagnosed based on:  Your symptoms.  A neurological exam. This involves checking central nervous system function, such as nerve function, reflexes, and coordination.  MRIs of the brain and spinal cord.  Lab tests, including a lumbar puncture that tests the fluid that surrounds the brain and spinal cord (cerebrospinal fluid).  Tests to measure the electrical activity of the brain in response to stimulation (evoked potentials). How is this treated? There is no cure for MS, but medicines can help decrease  the number and frequency of attacks and help relieve nuisance symptoms. Treatment options may include:  Medicines that reduce the frequency of attacks. These medicines may be given by injection, by mouth (orally), or through an IV.  Medicines that reduce inflammation (steroids).  These may provide short-term relief of symptoms.  Medicines to help control pain, depression, fatigue, or incontinence.  Vitamin D, if you have a deficiency.  Using devices to help you move around (assistive devices), such as braces, a cane, or a walker.  Physical therapy to strengthen and stretch your muscles.  Occupational therapy to help you with everyday tasks.  Alternative or complementary treatments such as exercise, massage, or acupuncture. Follow these instructions at home:  Take over-the-counter and prescription medicines only as told by your health care provider.  Do not drive or use heavy machinery while taking prescription pain medicine.  Use assistive devices as recommended by your physical therapist or your health care provider.  Exercise as directed by your health care provider.  Return to your normal activities as told by your health care provider. Ask your health care provider what activities are safe for you.  Reach out for support. Share your feelings with friends, family, or a support group.  Keep all follow-up visits as told by your health care provider and therapists. This is important. Where to find more information  National Multiple Sclerosis Society: https://www.nationalmssociety.org Contact a health care provider if:  You feel depressed.  You develop new pain or numbness.  You have tremors.  You have problems with sexual function. Get help right away if:  You develop paralysis.  You develop numbness.  You have problems with your bladder or bowel function.  You develop double vision.  You lose vision in one or both eyes.  You develop suicidal thoughts.  You develop severe confusion. If you ever feel like you may hurt yourself or others, or have thoughts about taking your own life, get help right away. You can go to your nearest emergency department or call:  Your local emergency services (911 in the [place]  A suicide crisis  helpline, such as the [place] at 407-407-0213. This is open 24 hours a day. Summary  Multiple sclerosis (MS) is a disease of the central nervous system that causes the body's immune system to destroy the protective covering (myelin sheath) around nerves in the brain.  There are 3 types of MS: relapsing-remitting, secondary progressive, and primary progressive. Relapsing-remitting and secondary progressive MS cause symptoms to occur in episodes or attacks that may last weeks to months. Primary progressive MS causes symptoms to steadily progress after they develop.  There is no cure for MS, but medicines can help decrease the number and frequency of attacks and help relieve nuisance symptoms. Treatment may also include physical or occupational therapy.  If you develop numbness, paralysis, vision problems, or other neurological symptoms, get help right away. This information is not intended to replace advice given to you by your health care provider. Make sure you discuss any questions you have with your health care provider. Document Revised: 04/02/2017 Document Reviewed: 06/29/2016 Elsevier Patient Education  2020 [employer].

## 2019-09-13 NOTE — Progress Notes (Signed)
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   Future    Standing Expiration Date:   11/12/2020    Order Specific Question:   If indicated for the ordered procedure, I authorize the administration of contrast media per Radiology protocol    Answer:   Yes    Order Specific Question:   What is the patient's sedation requirement?    Answer:   No Sedation    Order Specific Question:   Does the patient have a pacemaker or implanted devices?    Answer:   No    Order Specific Question:   Radiology Contrast Protocol - do NOT remove file path    Answer:   \[email or web address]    Order Specific Question:   Preferred imaging location?    Answer:   [facility] (table limit-550lbs)   MR THORACIC SPINE W WO CONTRAST    Standing Status:   Future    Standing Expiration Date:   11/12/2020    Order Specific Question:   GRA to provide read?    Answer:    Yes    Order Specific Question:   If indicated for the ordered procedure, I authorize the administration of contrast media per Radiology protocol    Answer:   Yes    Order Specific Question:   What is the patient's sedation requirement?    Answer:   No Sedation    Order Specific Question:   Does the patient have a pacemaker or implanted devices?    Answer:   No    Order Specific Question:   Preferred imaging location?    Answer:   [facility] (table limit-550lbs)    Order Specific Question:   Radiology Contrast Protocol - do NOT remove file path    Answer:   \[email or web address]\[email or web address]     No orders of the defined types were placed in this encounter.     I spent 45 minutes with the patient. 50% of this time was spent counseling and educating patient on plan of care and medications.    Debbora Presto, FNP-C 09/13/2019, 12:54 PM [facility] [address] [place], [place] [address] [phone]

## 2019-09-13 NOTE — Progress Notes (Signed)
I have read the note, and I agree with the clinical assessment and plan.  Caedence Snowden A. Charlisha Market, MD, PhD, FAAN Certified in Neurology, Clinical Neurophysiology, Sleep Medicine, Pain Medicine and Neuroimaging  [facility] [address], [address] [place], [place] [address] [phone]  

## 2019-09-21 ENCOUNTER — Encounter: Payer: Self-pay | Admitting: Gastroenterology

## 2019-09-21 DIAGNOSIS — Z0001 Encounter for general adult medical examination with abnormal findings: Secondary | ICD-10-CM | POA: Diagnosis not present

## 2019-09-25 DIAGNOSIS — F331 Major depressive disorder, recurrent, moderate: Secondary | ICD-10-CM | POA: Diagnosis not present

## 2019-09-25 DIAGNOSIS — E039 Hypothyroidism, unspecified: Secondary | ICD-10-CM | POA: Diagnosis not present

## 2019-09-25 DIAGNOSIS — G35 Multiple sclerosis: Secondary | ICD-10-CM | POA: Diagnosis not present

## 2019-09-25 DIAGNOSIS — Z0001 Encounter for general adult medical examination with abnormal findings: Secondary | ICD-10-CM | POA: Diagnosis not present

## 2019-09-25 DIAGNOSIS — E782 Mixed hyperlipidemia: Secondary | ICD-10-CM | POA: Diagnosis not present

## 2019-10-02 DIAGNOSIS — Z7984 Long term (current) use of oral hypoglycemic drugs: Secondary | ICD-10-CM | POA: Diagnosis not present

## 2019-10-02 DIAGNOSIS — G35 Multiple sclerosis: Secondary | ICD-10-CM | POA: Diagnosis not present

## 2019-10-02 DIAGNOSIS — E1142 Type 2 diabetes mellitus with diabetic polyneuropathy: Secondary | ICD-10-CM | POA: Diagnosis not present

## 2019-10-02 DIAGNOSIS — I10 Essential (primary) hypertension: Secondary | ICD-10-CM | POA: Diagnosis not present

## 2019-10-13 ENCOUNTER — Other Ambulatory Visit: Payer: Self-pay | Admitting: Family Medicine

## 2019-10-16 ENCOUNTER — Other Ambulatory Visit: Payer: Self-pay

## 2019-10-16 ENCOUNTER — Ambulatory Visit
Admission: RE | Admit: 2019-10-16 | Discharge: 2019-10-16 | Disposition: A | Discharge status: Home / Self Care | Payer: Medicare Other | Source: Ambulatory Visit | Attending: Family Medicine | Admitting: Family Medicine

## 2019-10-16 DIAGNOSIS — R52 Pain, unspecified: Secondary | ICD-10-CM

## 2019-10-16 DIAGNOSIS — G35 Multiple sclerosis: Secondary | ICD-10-CM | POA: Diagnosis not present

## 2019-10-16 DIAGNOSIS — R208 Other disturbances of skin sensation: Secondary | ICD-10-CM

## 2019-10-16 MED ORDER — GADOBENATE DIMEGLUMINE 529 MG/ML IV SOLN
15.0000 mL | Freq: Once | INTRAVENOUS | Status: AC | PRN
  Administered 2019-10-16: 15 mL via INTRAVENOUS

## 2019-10-18 ENCOUNTER — Ambulatory Visit
Admission: RE | Admit: 2019-10-18 | Discharge: 2019-10-18 | Disposition: A | Discharge status: Home / Self Care | Payer: Medicare Other | Source: Ambulatory Visit | Attending: Family Medicine | Admitting: Family Medicine

## 2019-10-18 DIAGNOSIS — R52 Pain, unspecified: Secondary | ICD-10-CM

## 2019-10-18 DIAGNOSIS — G35 Multiple sclerosis: Secondary | ICD-10-CM

## 2019-10-18 DIAGNOSIS — R208 Other disturbances of skin sensation: Secondary | ICD-10-CM

## 2019-10-18 MED ORDER — GADOBENATE DIMEGLUMINE 529 MG/ML IV SOLN
18.0000 mL | Freq: Once | INTRAVENOUS | Status: AC | PRN
  Administered 2019-10-18: 18 mL via INTRAVENOUS

## 2019-10-24 ENCOUNTER — Telehealth: Payer: Self-pay | Admitting: *Deleted

## 2019-10-24 NOTE — Telephone Encounter (Signed)
LMVM for her that MRI stable, from 04/2018.  Continue current therapy.  I left her mychart message as well.

## 2019-10-24 NOTE — Telephone Encounter (Signed)
-----   Message from Debbora Presto, NP sent at 10/24/2019 11:09 AM EDT ----- Please let her know that her MRI looks stable from 04/2018. We will continue current treatment plan.

## 2019-12-19 DIAGNOSIS — Z01419 Encounter for gynecological examination (general) (routine) without abnormal findings: Secondary | ICD-10-CM | POA: Diagnosis not present

## 2019-12-26 ENCOUNTER — Telehealth: Payer: Self-pay | Admitting: Cardiology

## 2019-12-26 NOTE — Telephone Encounter (Signed)
   Primary Cardiologist: Rozann Lesches, MD  Chart reviewed as part of pre-operative protocol coverage.    Simple dental extractions are considered low risk procedures per guidelines and generally do not require any specific cardiac clearance. It is also generally accepted that for simple extractions and dental cleanings, there is no need to interrupt blood thinner therapy.   SBE prophylaxis is not required for the patient.  I will route this recommendation to the requesting party via Epic fax function and remove from pre-op pool.  Please call with questions.  Kerin Ransom, PA-C 12/26/2019, 3:35 PM

## 2019-12-26 NOTE — Telephone Encounter (Signed)
    [facility] Pre-operative Risk Assessment    HEARTCARE STAFF: - Please ensure there is not already an duplicate clearance open for this procedure. - Under Visit Info/Reason for Call, type in Other and utilize the format Clearance MM/DD/YY or Clearance TBD. Do not use dashes or single digits. - If request is for dental extraction, please clarify the # of teeth to be extracted.  Request for surgical clearance:  1. What type of surgery is being performed?  1. 01/02/2020   2. When is this surgery scheduled?  1. Has abscessed tooth, and will have tooth removed   3. What type of clearance is required (medical clearance vs. Pharmacy clearance to hold med vs. Both)?  1. Pharmacy  4. Are there any medications that need to be held prior to surgery and how long? 1. Plavix  5. Practice name and name of physician performing surgery?  1. Dr. Clemens Catholic  6. What is the office phone number?  1. [phone] [address]   7.   What is the office fax number?     [phone]  8.   Anesthesia type (None, local, MAC, general) ?    1. Just numbing    Desma Paganini 12/26/2019, 3:10 PM  _________________________________________________________________   (provider comments below)

## 2020-01-10 ENCOUNTER — Other Ambulatory Visit: Payer: Self-pay | Admitting: Neurology

## 2020-01-26 ENCOUNTER — Other Ambulatory Visit: Payer: Self-pay

## 2020-01-26 ENCOUNTER — Telehealth: Payer: Self-pay | Admitting: Family Medicine

## 2020-01-26 ENCOUNTER — Encounter: Payer: Self-pay | Admitting: *Deleted

## 2020-01-26 ENCOUNTER — Ambulatory Visit (INDEPENDENT_AMBULATORY_CARE_PROVIDER_SITE_OTHER): Payer: BC Managed Care – PPO | Admitting: Family Medicine

## 2020-01-26 ENCOUNTER — Encounter: Payer: Self-pay | Admitting: Family Medicine

## 2020-01-26 VITALS — BP 140/78 | HR 77 | Ht 63.0 in | Wt 202.0 lb

## 2020-01-26 DIAGNOSIS — R079 Chest pain, unspecified: Secondary | ICD-10-CM | POA: Diagnosis not present

## 2020-01-26 DIAGNOSIS — R002 Palpitations: Secondary | ICD-10-CM | POA: Diagnosis not present

## 2020-01-26 DIAGNOSIS — I2510 Atherosclerotic heart disease of native coronary artery without angina pectoris: Secondary | ICD-10-CM

## 2020-01-26 DIAGNOSIS — R0602 Shortness of breath: Secondary | ICD-10-CM

## 2020-01-26 DIAGNOSIS — E782 Mixed hyperlipidemia: Secondary | ICD-10-CM

## 2020-01-26 MED ORDER — ISOSORBIDE MONONITRATE ER 30 MG PO TB24
15.0000 mg | ORAL_TABLET | Freq: Every day | ORAL | 1 refills | Status: DC
Start: 2020-01-26 — End: 2020-02-23

## 2020-01-26 MED ORDER — METOPROLOL TARTRATE 25 MG PO TABS
50.0000 mg | ORAL_TABLET | Freq: Every morning | ORAL | 2 refills | Status: DC
Start: 2020-01-26 — End: 2020-02-23

## 2020-01-26 NOTE — Progress Notes (Signed)
Cardiology Office Note  Date: 01/26/2020   ID: Mary Strong, DOB 09-Sep-1960, MRN [id number]  PCP:  Caryl Bis, MD  Cardiologist:  Rozann Lesches, MD Electrophysiologist:  None   Chief Complaint: Palpitations, racing heart, DOE, chest pain  History of Present Illness: Mary Strong is a 59 y.o. female with a history of CAD, HTN, palpitations, type II DM, fatty liver disease, GERD, history of sleep apnea not using CPAP, multiple sclerosis.  Last encounter with Dr. Domenic Polite 08/22/2019 via telemedicine visit.  She stated she had been doing reasonably well.  Had only used two nitroglycerin since last assessment.  No progressive exertional symptoms.  Denied any bleeding problems on dual antiplatelet therapy.  She was deferring elective colonoscopy until September.  Lipids have been very well controlled with a last LDL at 30.  She was taking Crestor 5 mg every other day.  CAD with multivessel distribution with heavy calcification.  Status post DES to CTO RCA and mid LAD September 2020.  Reporting no progressive anginal symptoms on medical therapy.  She was continuing Lopressor, low-dose statin as well as aspirin, Plavix, and Crestor.  Recent office visit with primary care provider on 01/24/2020 patient states she had been to the mountains recently and was walking when she had tachycardia.  She stated her heart rate felt like it was over 200.  She denied any associated chest pain.  She stated she was tired at times and having some palpitations.  Symptoms were described as moderate.  She was referred back to [place] for this reason.  Today she describes more recent palpitations while at home on Wednesday with associated chest tightness/shortness of breath, and dizziness.   Past Medical History:  Diagnosis Date  . Anxiety   . Arthritis   . CAD (coronary artery disease)    Heavily calcified vessels, DES to CTO RCA and DES to mid LAD September 2020  . Constipation   . Depression   .  Essential hypertension   . Fatty liver   . GERD (gastroesophageal reflux disease)   . History of migraine   . History of sleep apnea    Currently not using CPAP  . Hypothyroidism   . Multiple sclerosis ([place])   . Neurogenic bladder   . Neuropathy   . Palpitations   . Restless leg syndrome   . Skin cancer    Basal cell cancer on forehead, strong family hx of adenocarcinoma  . Type 2 diabetes mellitus ([place])     Past Surgical History:  Procedure Laterality Date  . CESAREAN SECTION     x 2  . CHOLECYSTECTOMY    . CORONARY ATHERECTOMY N/A 01/18/2019   Procedure: CORONARY ATHERECTOMY;  Surgeon: Martinique, Peter M, MD;  Location: [facility];  Service: Cardiovascular;  Laterality: N/A;  . CORONARY CTO INTERVENTION N/A 01/18/2019   Procedure: CORONARY CTO INTERVENTION;  Surgeon: Martinique, Peter M, MD;  Location: [facility];  Service: Cardiovascular;  Laterality: N/A;  . CORONARY STENT INTERVENTION N/A 01/18/2019   Procedure: CORONARY STENT INTERVENTION;  Surgeon: Martinique, Peter M, MD;  Location: [facility];  Service: Cardiovascular;  Laterality: N/A;  . ENDOMETRIAL ABLATION    . JOINT REPLACEMENT    . LEFT HEART CATH AND CORONARY ANGIOGRAPHY N/A 01/10/2019   Procedure: LEFT HEART CATH AND CORONARY ANGIOGRAPHY;  Surgeon: Belva Crome, MD;  Location: [facility];  Service: Cardiovascular;  Laterality: N/A;  . TONSILLECTOMY    .  TOTAL HIP ARTHROPLASTY Right 12/04/2015  . TOTAL HIP ARTHROPLASTY Right 12/04/2015   Procedure: TOTAL HIP ARTHROPLASTY ANTERIOR APPROACH;  Surgeon: Marybelle Killings, MD;  Location: [facility];  Service: Orthopedics;  Laterality: Right;  . TOTAL HIP ARTHROPLASTY Left 01/24/2016   Procedure: TOTAL HIP ARTHROPLASTY ANTERIOR APPROACH;  Surgeon: Marybelle Killings, MD;  Location: [facility];  Service: Orthopedics;  Laterality: Left;  anterior approach  . TUBAL LIGATION      Current Outpatient Medications  Medication Sig Dispense Refill  . aspirin 81 MG tablet Take 81  mg by mouth daily.    Marland Kitchen BIOTIN PO Take 500 mcg by mouth daily.    . calcium carbonate (TUMS - DOSED IN MG ELEMENTAL CALCIUM) 500 MG chewable tablet Chew 3 tablets by mouth daily as needed for indigestion or heartburn.    . clopidogrel (PLAVIX) 75 MG tablet Take 1 tablet (75 mg total) by mouth daily. 90 tablet 1  . diazepam (VALIUM) 5 MG tablet TAKE ONE TABLET BY MOUTH THREE TIMES DAILY AS NEEDED 90 tablet 5  . Dulaglutide (TRULICITY) 1.5 ZT/2.4PY SOPN Inject 1.5 mg into the skin every Saturday.     . DULoxetine (CYMBALTA) 60 MG capsule Take 1 capsule (60 mg total) by mouth at bedtime. 30 capsule 11  . gabapentin (NEURONTIN) 600 MG tablet Take 1 tablet (600 mg total) by mouth 3 (three) times daily. 90 tablet 5  . levothyroxine (SYNTHROID) 112 MCG tablet Take 112 mcg by mouth daily before breakfast.    . metFORMIN (GLUCOPHAGE) 500 MG tablet Take 1 tablet (500 mg total) by mouth 2 (two) times daily.    . metoprolol tartrate (LOPRESSOR) 25 MG tablet Take 2 tablets (50 mg total) by mouth in the morning. & 25 mg in the evening 270 tablet 2  . mometasone (ELOCON) 0.1 % cream Apply 1 application topically daily as needed (psoriasis).     . nitroGLYCERIN (NITROSTAT) 0.4 MG SL tablet Place 1 tablet (0.4 mg total) under the tongue every 5 (five) minutes as needed. (Patient taking differently: Place 0.4 mg under the tongue every 5 (five) minutes as needed for chest pain. ) 25 tablet 2  . pantoprazole (PROTONIX) 40 MG tablet Take 40 mg by mouth daily.     . rosuvastatin (CRESTOR) 5 MG tablet Take 1 tablet (5 mg total) by mouth every morning. 90 tablet 1  . isosorbide mononitrate (IMDUR) 30 MG 24 hr tablet Take 0.5 tablets (15 mg total) by mouth daily. 45 tablet 1   No current facility-administered medications for this visit.   Allergies:  Shellfish allergy, Ciprofloxacin, Copaxone [glatiramer acetate], Penicillins, and Vancomycin   Social History: The patient  reports that she has never smoked. She has never  used smokeless tobacco. She reports current alcohol use. She reports current drug use. Drug: Marijuana.   Family History: The patient's family history includes Arthritis/Rheumatoid in her mother; Multiple sclerosis in her mother; Psoriasis in her father.   ROS:  Please see the history of present illness. Otherwise, complete review of systems is positive for none.  All other systems are reviewed and negative.   Physical Exam: VS:  BP 140/78   Pulse 77   Ht 5\' 3"  (1.6 m)   Wt 202 lb (91.6 kg)   SpO2 96%   BMI 35.78 kg/m , BMI Body mass index is 35.78 kg/m.  Wt Readings from Last 3 Encounters:  01/26/20 202 lb (91.6 kg)  09/13/19 198 lb (89.8 kg)  08/22/19 195 lb (88.5  kg)    General: Patient appears comfortable at rest. Neck: Supple, no elevated JVP or carotid bruits, no thyromegaly. Lungs: Clear to auscultation, nonlabored breathing at rest. Cardiac: Regular rate and rhythm, no S3 or significant systolic murmur, no pericardial rub. Extremities: No pitting edema, distal pulses 2+. Skin: Warm and dry. Musculoskeletal: No kyphosis. Neuropsychiatric: Alert and oriented x3, affect grossly appropriate.  ECG:  An ECG dated 01/26/2020 was personally reviewed today and demonstrated:  Normal sinus rhythm, right bundle branch block rate of 77, possible anterior lateral infarct, age undetermined.  Recent Labwork: 02/27/2019: ALT 13; AST 19; BUN 12; Creat 1.02; Potassium 4.5; Sodium 140 05/24/2019: Platelets 282 08/17/2019: Hemoglobin 10.9     Component Value Date/Time   CHOL  05/10/2010 2350    143        ATP III CLASSIFICATION:  <200     mg/dL   Desirable  200-239  mg/dL   Borderline High  >=240    mg/dL   High          TRIG 258 (H) 05/10/2010 2350   HDL 22 (L) 05/10/2010 2350   CHOLHDL 6.5 05/10/2010 2350   VLDL 52 (H) 05/10/2010 2350   LDLCALC  05/10/2010 2350    69        Total Cholesterol/HDL:CHD Risk Coronary Heart Disease Risk Table                     Men   Women  1/2  Average Risk   3.4   3.3  Average Risk       5.0   4.4  2 X Average Risk   9.6   7.1  3 X Average Risk  23.4   11.0        Use the calculated Patient Ratio above and the CHD Risk Table to determine the patient's CHD Risk.        ATP III CLASSIFICATION (LDL):  <100     mg/dL   Optimal  100-129  mg/dL   Near or Above                    Optimal  130-159  mg/dL   Borderline  160-189  mg/dL   High  >190     mg/dL   Very High    Other Studies Reviewed Today:   Cardiac catheterization 01/10/2019:  Heavy three-vessel coronary calcification.  Normal left main.  Mid LAD within a heavily calcified segment 50 to 60% followed by 85% tandem stenoses. Supplies collaterals to RCA via septal perforators and apical segment.  Circumflex gives 3 obtuse marginal branches. Second obtuse marginal contains mid vessel 40% narrowing. Supplies collaterals to distal RCA.  Total occlusion in the mid vessel within a heavily calcified segment. Total occlusion appears to be a long segment.  Normal left ventricular size and function. EF 60%.  RECOMMENDATIONS:   Aggressive preventive therapy driving LDL down to 55 with PCSK9 therapy since she seems to be intolerant to statin therapy. Also consider SGLT2 therapy for diabetes.  Anti-ischemic therapy as needed to control symptoms.  Consider surgical revascularization given diabetes, severe hyperlipidemia, significant involvement of LAD (FFR by CTA). The other option would be RCA CTO PCI followed in a staged manner by LAD orbital atherectomy and stenting. Since the RCA is well collateralized, and the LAD is involved in the mid segment, I would recommend percutaneous intervention prior to subjecting her to surgery.  PCI 01/18/2019:  Prox LAD to Mid LAD  lesion is 55% stenosed.  Mid LAD lesion is 85% stenosed.  A drug-eluting stent was successfully placed using a STENT SYNERGY DES 3X38.  Post intervention, there is a 0% residual stenosis.  Prox  RCA lesion is 100% stenosed.  A drug-eluting stent was successfully placed using a STENT SYNERGY DES 3.5X20.  Post intervention, there is a 0% residual stenosis.  1. Successful CTO PCI of the proximal RCA with rotational atherectomy and DES x 1 2. Successful PCI of the mid LAD with rotational atherectomy and DES x 1.   Plan: DAPT for one year. Will observe overnight. Anticipate DC in am if stable. Diagnostic Dominance: Right  Intervention       Assessment and Plan:  1. Palpitations   2. Chest pain, unspecified type   3. CAD in native artery   4. Hyperlipidemia, mixed   5. SOB (shortness of breath)    1. Palpitations Patient complaining of recent palpitations.  Recently saw her primary care provider on Wednesday, September 22.  She stated she had been to the mountains and was taking some heavy items up some steep steps and all of a sudden became significantly tachycardic and stated of heart rate felt like it was around 200.  States it took a while for rapid heart rate/palpitations to resolve.  States she had a second episode this Wednesday with associated chest pain, dizziness, and shortness of breath while sitting still watching a TV program.  Please get a 14-day ZIO monitor to assess for tachyarrhythmias.  Increase a.m. dose of metoprolol to 50 mg and continue with p.m. dose of 25 mg.  Recent labs from PCP TSH 0.65 glucose 176, creatinine 0.86, GFR 75, sodium 143, potassium 5, calcium 9,   2. Chest pain, unspecified type Patient states she had associated chest pain with these palpitations on Wednesday.  However on the prior episode when she was in the mountains she did not have any chest pain.  Start Imdur 30 mg.  May take 1/2 pill or 15 mg to start.  Order a Lexiscan stress test.  History of stent placement to LAD and RCA 2020.  Continue aspirin 81 mg, Plavix 75 mg daily.  Nitroglycerin sublingual as needed   3. CAD in native artery Cardiac catheterization on 01/18/2019  demonstrating proximal LAD to mid LAD 55% stenosed.  Mid LAD 85% stenosis.  DES successfully placed with postintervention stenosis of 0.  Proximal RCA lesion 100% stenosis.  DES successfully placed with post residual stenosis of 0.   4. Hyperlipidemia, mixed Continue Crestor 5 mg daily.Lipid panel on 01/19/2020: Total cholesterol 135, triglycerides 284, HDL 31, LDL 59.     5. SOB (shortness of breath) Patient complaining of increased dyspnea on exertion.  Please get an echocardiogram to assess LV function, diastolic function and valvular function.   Medication Adjustments/Labs and Tests Ordered: Current medicines are reviewed at length with the patient today.  Concerns regarding medicines are outlined above.   Disposition: Follow-up with Dr. Domenic Polite or APP 4 to 6 weeks  Signed, Levell July, NP 01/26/2020 5:00 PM    [facility] [place], [place],  [address] Phone: [phone]; Fax: [phone]

## 2020-01-26 NOTE — Telephone Encounter (Signed)
Pre-cert Verification for the following procedure    14 Day ZIO XT   lEXISCAN MYOVIEW ECHO   DATE: 02/01/2020 [place]

## 2020-01-26 NOTE — Patient Instructions (Addendum)
Medication Instructions:   Your physician has recommended you make the following change in your medication:   Start isosorbide mononitrate 15 mg daily  Increase metoprolol tartrate to 50 mg in the morning and 25 mg in the evening  Continue other medications the same  Labwork:  None  Testing/Procedures: Your physician has requested that you have a lexiscan myoview. For further information please visit [email or web address]. Please follow instruction sheet, as given. Your physician has requested that you have an echocardiogram. Echocardiography is a painless test that uses sound waves to create images of your heart. It provides your doctor with information about the size and shape of your heart and how well your heart's chambers and valves are working. This procedure takes approximately one hour. There are no restrictions for this procedure. ZIO- Long Term Monitor Instructions   Your physician has requested you wear your ZIO patch monitor 14 days.   This is a single patch monitor.  [employer] supplies one patch monitor per enrollment.  Additional stickers are not available.   Please do not apply patch if you will be having a Nuclear Stress Test, Echocardiogram, Cardiac CT, MRI, or Chest Xray during the time frame you would be wearing the monitor. The patch cannot be worn during these tests.  You cannot remove and re-apply the ZIO XT patch monitor.   Your ZIO patch monitor will be sent USPS Priority mail from [facility] directly to your home address. The monitor may also be mailed to a PO BOX if home delivery is not available.   It may take 3-5 days to receive your monitor after you have been enrolled.   Once you have received you monitor, please review enclosed instructions.  Your monitor has already been registered assigning a specific monitor serial # to you.   Applying the monitor   Shave hair from upper left chest.   Hold abrader disc by orange tab.  Rub abrader in 40 strokes  over left upper chest as indicated in your monitor instructions.   Clean area with 4 enclosed alcohol pads .  Use all pads to assure are is cleaned thoroughly.  Let dry.   Apply patch as indicated in monitor instructions.  Patch will be place under collarbone on left side of chest with arrow pointing upward.   Rub patch adhesive wings for 2 minutes.Remove white label marked "1".  Remove white label marked "2".  Rub patch adhesive wings for 2 additional minutes.   While looking in a mirror, press and release button in center of patch.  A small green light will flash 3-4 times .  This will be your only indicator the monitor has been turned on.     Do not shower for the first 24 hours.  You may shower after the first 24 hours.   Press button if you feel a symptom. You will hear a small click.  Record Date, Time and Symptom in the Patient Log Book.   When you are ready to remove patch, follow instructions on last 2 pages of Patient Log Book.  Stick patch monitor onto last page of Patient Log Book.   Place Patient Log Book in [place] box.  Use locking tab on box and tape box closed securely.  The [employer] has [employer] on it.  Please place in mailbox as soon as possible.  Your physician should have your test results approximately 7 days after the monitor has been mailed back to [facility].   Call [employer] Customer Care at [phone] if you have questions regarding your ZIO XT patch monitor.  Call them immediately if you see an orange light blinking on your monitor.    If your monitor falls off in less than 4 days contact our Monitor department at [phone].  If your monitor becomes loose or falls off after 4 days call [employer] at [phone] for suggestions on securing your monitor.  Follow-Up:  Your physician recommends that you schedule a follow-up appointment in: 4-6 weeks  Any Other Special Instructions Will Be Listed Below (If Applicable).  If you need a  refill on your cardiac medications before your next appointment, please call your pharmacy.

## 2020-01-30 ENCOUNTER — Other Ambulatory Visit: Payer: Self-pay | Admitting: Cardiology

## 2020-02-01 ENCOUNTER — Ambulatory Visit (HOSPITAL_COMMUNITY)
Admission: RE | Admit: 2020-02-01 | Discharge: 2020-02-01 | Disposition: A | Discharge status: Home / Self Care | Payer: BC Managed Care – PPO | Source: Ambulatory Visit | Attending: Family Medicine | Admitting: Family Medicine

## 2020-02-01 ENCOUNTER — Encounter (HOSPITAL_BASED_OUTPATIENT_CLINIC_OR_DEPARTMENT_OTHER)
Admission: RE | Admit: 2020-02-01 | Discharge: 2020-02-01 | Disposition: A | Discharge status: Home / Self Care | Payer: BC Managed Care – PPO | Source: Ambulatory Visit | Attending: Family Medicine | Admitting: Family Medicine

## 2020-02-01 ENCOUNTER — Other Ambulatory Visit: Payer: Self-pay

## 2020-02-01 ENCOUNTER — Encounter (HOSPITAL_COMMUNITY)
Admission: RE | Admit: 2020-02-01 | Discharge: 2020-02-01 | Disposition: A | Discharge status: Home / Self Care | Payer: BC Managed Care – PPO | Source: Ambulatory Visit | Attending: Family Medicine | Admitting: Family Medicine

## 2020-02-01 DIAGNOSIS — R079 Chest pain, unspecified: Secondary | ICD-10-CM | POA: Insufficient documentation

## 2020-02-01 DIAGNOSIS — I2510 Atherosclerotic heart disease of native coronary artery without angina pectoris: Secondary | ICD-10-CM | POA: Insufficient documentation

## 2020-02-01 DIAGNOSIS — R0602 Shortness of breath: Secondary | ICD-10-CM | POA: Insufficient documentation

## 2020-02-01 DIAGNOSIS — R002 Palpitations: Secondary | ICD-10-CM | POA: Insufficient documentation

## 2020-02-01 LAB — NM MYOCAR MULTI W/SPECT W/WALL MOTION / EF
LV dias vol: 69 mL (ref 46–106)
LV sys vol: 20 mL
Peak HR: 97 {beats}/min
RATE: 0.35
Rest HR: 69 {beats}/min
SDS: 4
SRS: 1
SSS: 5
TID: 1.35

## 2020-02-01 LAB — ECHOCARDIOGRAM COMPLETE
Area-P 1/2: 4.15 cm2
S' Lateral: 2.72 cm

## 2020-02-01 MED ORDER — TECHNETIUM TC 99M TETROFOSMIN IV KIT
30.0000 | PACK | Freq: Once | INTRAVENOUS | Status: AC | PRN
  Administered 2020-02-01: 31 via INTRAVENOUS

## 2020-02-01 MED ORDER — REGADENOSON 0.4 MG/5ML IV SOLN
INTRAVENOUS | Status: AC
  Administered 2020-02-01: 0.4 mg via INTRAVENOUS
  Filled 2020-02-01: qty 5

## 2020-02-01 MED ORDER — TECHNETIUM TC 99M TETROFOSMIN IV KIT
10.0000 | PACK | Freq: Once | INTRAVENOUS | Status: AC | PRN
  Administered 2020-02-01: 9.57 via INTRAVENOUS

## 2020-02-01 MED ORDER — SODIUM CHLORIDE FLUSH 0.9 % IV SOLN
INTRAVENOUS | Status: AC
  Administered 2020-02-01: 10 mL via INTRAVENOUS
  Filled 2020-02-01: qty 10

## 2020-02-01 NOTE — Progress Notes (Signed)
  Echocardiogram 2D Echocardiogram has been performed.  Mary Strong 02/01/2020, 3:38 PM

## 2020-02-05 ENCOUNTER — Ambulatory Visit: Payer: Medicare Other

## 2020-02-05 ENCOUNTER — Telehealth: Payer: Self-pay | Admitting: Family Medicine

## 2020-02-05 DIAGNOSIS — R002 Palpitations: Secondary | ICD-10-CM

## 2020-02-05 NOTE — Telephone Encounter (Signed)
Returning call for results  °

## 2020-02-05 NOTE — Telephone Encounter (Signed)
Pt voiced understanding   echocardiogram showed good pumping of the heart. The main pumping chamber is a little muscular. This is best managed by keeping the blood pressure at or below 130/80 and managing other risk factors such as diabetes and weight loss, sleep apnea. She has a very mildly leaking mitral valve  Please call the patient and let her know the stress test showed a small mild area of ischemia. However the doctor who interpreted the test considered it low risk. Thanks

## 2020-02-08 NOTE — Telephone Encounter (Signed)
Yes she needs to wear the monitor to see if that is causing the strange foggy feeling in her head

## 2020-02-22 NOTE — Progress Notes (Signed)
Cardiology Office Note  Date: 02/23/2020   ID: ABIMBOLA AKI, DOB 1960-09-29, MRN [id number]  PCP:  Caryl Bis, MD  Cardiologist:  Rozann Lesches, MD Electrophysiologist:  None   Chief Complaint: Palpitations, racing heart, DOE, chest pain  History of Present Illness: Mary Strong is a 59 y.o. female with a history of CAD, HTN, palpitations, type II DM, fatty liver disease, GERD, history of sleep apnea not using CPAP, multiple sclerosis.  Last encounter with Dr. Domenic Polite 08/22/2019 via telemedicine visit.  She stated she had been doing reasonably well.  Had only used two nitroglycerin since last assessment.  No progressive exertional symptoms.  Denied any bleeding problems on dual antiplatelet therapy.  She was deferring elective colonoscopy until September.  Lipids had been very well controlled with a last LDL at 30.  She was taking Crestor 5 mg every other day.  CAD with multivessel distribution with heavy calcification.  Status post DES to CTO RCA and mid LAD September 2020.  Reporting no progressive anginal symptoms on medical therapy.  She was continuing Lopressor, low-dose statin as well as aspirin, Plavix, and Crestor.  Recent office visit with primary care provider on 01/24/2020 patient stated she had been to the mountains recently and was walking when she had tachycardia.  She stated her heart rate felt like it was over 200.  She denied any associated chest pain.  She stated she was tired at times and having some palpitations.  Symptoms were described as moderate.  She was referred back to [place] for this reason.  Last visit she described more recent palpitations while at home on Wednesday with associated chest tightness/shortness of breath, and dizziness.  She is here today for follow-up for review of recent stress test, event monitor, and echocardiogram.  States she feels a little better since increasing the daytime dose of metoprolol to 50 mg.  States she had to stop  the Imdur due to side effects.  We discussed the stress test results, ZIO monitor results, and echocardiogram results listed below.  She does however state when she attempts to perform more than usual activity her heart rate races and she has associated dyspnea.  Results which showed no diagnostic ST changes on EKG.  Had a small mild intensity mid to basal inferior lateral defect that was reversible and consistent with ischemia.  This was considered a low risk study, EF was 71%.  ZIO monitor showed minimum heart rate of 57, maximum heart rate of 167, average heart rate of 80.  Predominant rhythm was sinus rhythm.  She had 5 SVT runs.  The run with the fastest interval lasting 9 beats with a maximum of 167.  The longest lasting was 9 beats with an average of 125.  The SVT was detected within 45 seconds of symptomatic patient event.  SVE's and SVE couplets and triplets were rare less than 1%.  Isolated VE's, VE couplets were rare.  There are no VE triplets present.  Echocardiogram EF 60-65%, moderate LVH, no WMA's..  Trivial MR.   Past Medical History:  Diagnosis Date  . Anxiety   . Arthritis   . CAD (coronary artery disease)    Heavily calcified vessels, DES to CTO RCA and DES to mid LAD September 2020  . Constipation   . Depression   . Essential hypertension   . Fatty liver   . GERD (gastroesophageal reflux disease)   . History of migraine   . History of sleep  apnea    Currently not using CPAP  . Hypothyroidism   . Multiple sclerosis ([place])   . Neurogenic bladder   . Neuropathy   . Palpitations   . Restless leg syndrome   . Skin cancer    Basal cell cancer on forehead, strong family hx of adenocarcinoma  . Type 2 diabetes mellitus ([place])     Past Surgical History:  Procedure Laterality Date  . CESAREAN SECTION     x 2  . CHOLECYSTECTOMY    . CORONARY ATHERECTOMY N/A 01/18/2019   Procedure: CORONARY ATHERECTOMY;  Surgeon: Martinique, Peter M, MD;  Location: [facility];   Service: Cardiovascular;  Laterality: N/A;  . CORONARY CTO INTERVENTION N/A 01/18/2019   Procedure: CORONARY CTO INTERVENTION;  Surgeon: Martinique, Peter M, MD;  Location: [facility];  Service: Cardiovascular;  Laterality: N/A;  . CORONARY STENT INTERVENTION N/A 01/18/2019   Procedure: CORONARY STENT INTERVENTION;  Surgeon: Martinique, Peter M, MD;  Location: [facility];  Service: Cardiovascular;  Laterality: N/A;  . ENDOMETRIAL ABLATION    . JOINT REPLACEMENT    . LEFT HEART CATH AND CORONARY ANGIOGRAPHY N/A 01/10/2019   Procedure: LEFT HEART CATH AND CORONARY ANGIOGRAPHY;  Surgeon: Belva Crome, MD;  Location: [facility];  Service: Cardiovascular;  Laterality: N/A;  . TONSILLECTOMY    . TOTAL HIP ARTHROPLASTY Right 12/04/2015  . TOTAL HIP ARTHROPLASTY Right 12/04/2015   Procedure: TOTAL HIP ARTHROPLASTY ANTERIOR APPROACH;  Surgeon: Marybelle Killings, MD;  Location: [facility];  Service: Orthopedics;  Laterality: Right;  . TOTAL HIP ARTHROPLASTY Left 01/24/2016   Procedure: TOTAL HIP ARTHROPLASTY ANTERIOR APPROACH;  Surgeon: Marybelle Killings, MD;  Location: [facility];  Service: Orthopedics;  Laterality: Left;  anterior approach  . TUBAL LIGATION      Current Outpatient Medications  Medication Sig Dispense Refill  . aspirin 81 MG tablet Take 81 mg by mouth daily.    Marland Kitchen BIOTIN PO Take 500 mcg by mouth daily.    . calcium carbonate (TUMS - DOSED IN MG ELEMENTAL CALCIUM) 500 MG chewable tablet Chew 3 tablets by mouth daily as needed for indigestion or heartburn.    . clopidogrel (PLAVIX) 75 MG tablet TAKE ONE TABLET BY MOUTH ONCE DAILY 90 tablet 1  . diazepam (VALIUM) 5 MG tablet TAKE ONE TABLET BY MOUTH THREE TIMES DAILY AS NEEDED 90 tablet 5  . Dulaglutide (TRULICITY) 1.5 UR/4.2HC SOPN Inject 1.5 mg into the skin every Saturday.     . DULoxetine (CYMBALTA) 60 MG capsule Take 1 capsule (60 mg total) by mouth at bedtime. 30 capsule 11  . gabapentin (NEURONTIN) 600 MG tablet Take 1 tablet (600 mg  total) by mouth 3 (three) times daily. 90 tablet 5  . levothyroxine (SYNTHROID) 112 MCG tablet Take 112 mcg by mouth daily before breakfast.    . metFORMIN (GLUCOPHAGE) 500 MG tablet Take 1 tablet (500 mg total) by mouth 2 (two) times daily.    . mometasone (ELOCON) 0.1 % cream Apply 1 application topically daily as needed (psoriasis).     . nitroGLYCERIN (NITROSTAT) 0.4 MG SL tablet Place 1 tablet (0.4 mg total) under the tongue every 5 (five) minutes as needed. (Patient taking differently: Place 0.4 mg under the tongue every 5 (five) minutes as needed for chest pain. ) 25 tablet 2  . pantoprazole (PROTONIX) 40 MG tablet Take 40 mg by mouth daily.     . rosuvastatin (CRESTOR) 5 MG tablet Take  1 tablet (5 mg total) by mouth every morning. 90 tablet 1  . diltiazem (CARDIZEM) 30 MG tablet Take 1 tablet (30 mg total) by mouth 2 (two) times daily as needed. 180 tablet 1  . metoprolol tartrate (LOPRESSOR) 50 MG tablet Take 1 tablet (50 mg total) by mouth 2 (two) times daily. 180 tablet 1   No current facility-administered medications for this visit.   Allergies:  Shellfish allergy, Ciprofloxacin, Copaxone [glatiramer acetate], Penicillins, and Vancomycin   Social History: The patient  reports that she has never smoked. She has never used smokeless tobacco. She reports current alcohol use. She reports current drug use. Drug: Marijuana.   Family History: The patient's family history includes Arthritis/Rheumatoid in her mother; Multiple sclerosis in her mother; Psoriasis in her father.   ROS:  Please see the history of present illness. Otherwise, complete review of systems is positive for none.  All other systems are reviewed and negative.   Physical Exam: VS:  BP 130/80   Pulse 75   Ht 5\' 3"  (1.6 m)   Wt 197 lb (89.4 kg)   SpO2 97%   BMI 34.90 kg/m , BMI Body mass index is 34.9 kg/m.  Wt Readings from Last 3 Encounters:  02/23/20 197 lb (89.4 kg)  01/26/20 202 lb (91.6 kg)  09/13/19 198 lb  (89.8 kg)    General: Patient appears comfortable at rest. Neck: Supple, no elevated JVP or carotid bruits, no thyromegaly. Lungs: Clear to auscultation, nonlabored breathing at rest. Cardiac: Regular rate and rhythm, no S3 or significant systolic murmur, no pericardial rub. Extremities: No pitting edema, distal pulses 2+. Skin: Warm and dry. Musculoskeletal: No kyphosis. Neuropsychiatric: Alert and oriented x3, affect grossly appropriate.  ECG:  An ECG dated 01/26/2020 was personally reviewed today and demonstrated:  Normal sinus rhythm, right bundle branch block rate of 77, possible anterior lateral infarct, age undetermined.  Recent Labwork: 02/27/2019: ALT 13; AST 19; BUN 12; Creat 1.02; Potassium 4.5; Sodium 140 05/24/2019: Platelets 282 08/17/2019: Hemoglobin 10.9     Component Value Date/Time   CHOL  05/10/2010 2350    143        ATP III CLASSIFICATION:  <200     mg/dL   Desirable  200-239  mg/dL   Borderline High  >=240    mg/dL   High          TRIG 258 (H) 05/10/2010 2350   HDL 22 (L) 05/10/2010 2350   CHOLHDL 6.5 05/10/2010 2350   VLDL 52 (H) 05/10/2010 2350   LDLCALC  05/10/2010 2350    69        Total Cholesterol/HDL:CHD Risk Coronary Heart Disease Risk Table                     Men   Women  1/2 Average Risk   3.4   3.3  Average Risk       5.0   4.4  2 X Average Risk   9.6   7.1  3 X Average Risk  23.4   11.0        Use the calculated Patient Ratio above and the CHD Risk Table to determine the patient's CHD Risk.        ATP III CLASSIFICATION (LDL):  <100     mg/dL   Optimal  100-129  mg/dL   Near or Above  Optimal  130-159  mg/dL   Borderline  160-189  mg/dL   High  >190     mg/dL   Very High    Other Studies Reviewed Today:  NST 02/01/2020 Study Result Narrative & Impression   No diagnostic ST segment changes to indicate ischemia.  Small, mild intensity, mid to basal inferolateral defect that is reversible and consistent with  ischemia.  This is a low risk study.  Nuclear stress EF: 71%.     Zio Monitor 02/05/2020 Preliminary Findings Final Interpretation Patient had a min HR of 57 bpm, max HR of 167 bpm, and avg HR of 80 bpm. Predominant underlying rhythm was Sinus Rhythm. 5 Supraventricular Tachycardia runs occurred, the run with the fastest interval lasting 9 beats with a max rate of 167 bpm, the longest lasting 9 beats with an avg rate of 125 bpm. Supraventricular Tachycardia was detected within +/- 45 seconds of symptomatic patient event(s). Isolated SVEs were rare (<1.0%), SVE Couplets were rare (<1.0%), and SVE Triplets were rare (<1.0%). Isolated VEs were rare (<1.0%), VE Couplets were rare (<1.0%), and no VE Triplets were present.  Echocardiogram 02/01/2020 1. Left ventricular ejection fraction, by estimation, is 60 to 65%. The left ventricle has normal function. The left ventricle has no regional wall motion abnormalities. There is moderate left ventricular hypertrophy. Left ventricular diastolic parameters are indeterminate. 2. Right ventricular systolic function is normal. The right ventricular size is normal. There is normal pulmonary artery systolic pressure. The estimated right ventricular systolic pressure is 56.4 mmHg. 3. The mitral valve is grossly normal. Trivial mitral valve regurgitation. 4. The aortic valve is tricuspid. Aortic valve regurgitation is not visualized. 5. The inferior vena cava is normal in size with greater than 50% respiratory variability, suggesting right atrial pressure of 3 mmHg.   Cardiac catheterization 01/10/2019:  Heavy three-vessel coronary calcification.  Normal left main.  Mid LAD within a heavily calcified segment 50 to 60% followed by 85% tandem stenoses. Supplies collaterals to RCA via septal perforators and apical segment.  Circumflex gives 3 obtuse marginal branches. Second obtuse marginal contains mid vessel 40% narrowing. Supplies collaterals to  distal RCA.  Total occlusion in the mid vessel within a heavily calcified segment. Total occlusion appears to be a long segment.  Normal left ventricular size and function. EF 60%.  RECOMMENDATIONS:   Aggressive preventive therapy driving LDL down to 55 with PCSK9 therapy since she seems to be intolerant to statin therapy. Also consider SGLT2 therapy for diabetes.  Anti-ischemic therapy as needed to control symptoms.  Consider surgical revascularization given diabetes, severe hyperlipidemia, significant involvement of LAD (FFR by CTA). The other option would be RCA CTO PCI followed in a staged manner by LAD orbital atherectomy and stenting. Since the RCA is well collateralized, and the LAD is involved in the mid segment, I would recommend percutaneous intervention prior to subjecting her to surgery.  PCI 01/18/2019:  Prox LAD to Mid LAD lesion is 55% stenosed.  Mid LAD lesion is 85% stenosed.  A drug-eluting stent was successfully placed using a STENT SYNERGY DES 3X38.  Post intervention, there is a 0% residual stenosis.  Prox RCA lesion is 100% stenosed.  A drug-eluting stent was successfully placed using a STENT SYNERGY DES 3.5X20.  Post intervention, there is a 0% residual stenosis.  1. Successful CTO PCI of the proximal RCA with rotational atherectomy and DES x 1 2. Successful PCI of the mid LAD with rotational atherectomy and DES x 1.   Plan:  DAPT for one year. Will observe overnight. Anticipate DC in am if stable. Diagnostic Dominance: Right  Intervention       Assessment and Plan:   1. Palpitations At last visit patient complained of recent palpitations.  He had recently seen her primary care provider on Wednesday, September 22.  She stated she had been to the mountains and was taking some heavy items up some steep steps and all of a sudden became significantly tachycardic and stated of heart rate felt like it was around 200.  Stated it took a while  for rapid heart rate/palpitations to resolve.  Stated she had a second episode  with associated chest pain, dizziness, and shortness of breath while sitting still watching a TV program.  She had a ZIO monitor which showed 5 episodes of SVT.  Underlying rhythm was sinus.  All other ectopy was less than 1% frequency.  Today she states since increasing the a.m. dose of metoprolol these episodes have decreased.  We will increase metoprolol to 50 mg p.o. twice daily.  We will give her Cardizem 30 mg as needed for increased heart rate during activity if needed.  Advised her to check her blood pressure and heart rate prior to taking the medication.  2. Chest pain, unspecified type Patient states she had associated chest pain with these palpitations on Wednesday.  However on the prior episode when she was in the mountains she did not have any chest pain.    History of stent placement to LAD and RCA 2020.  Continue aspirin 81 mg, DC Plavix.  She is 1 year out from recent stent placement November 2020.  Nitroglycerin sublingual as needed.  Recent repeat stress test noted above in H&P and under diagnostic studies essentially low risk.   3. CAD in native artery Cardiac catheterization on 01/18/2019 demonstrating proximal LAD to mid LAD 55% stenosed.  Mid LAD 85% stenosis.  DES successfully placed with postintervention stenosis of 0.  Proximal RCA lesion 100% stenosis.  DES successfully placed with post residual stenosis of 0.  Please advise patient she can stop her Plavix.  She is 1 year out from stent placement.  4. Hyperlipidemia, mixed Continue Crestor 5 mg daily.Lipid panel on 01/19/2020: Total cholesterol 135, triglycerides 284, HDL 31, LDL 59.     5. SOB (shortness of breath) Patient complaining of increased dyspnea on exertion.  Recent echocardiogram for shortness of breath EF 60 to 65%, moderate LVH, trivial MR.  Patient states symptoms are better since increasing a.m. dose of metoprolol.  We are increasing  dose of metoprolol to 50 mg p.o. twice daily.  Medication Adjustments/Labs and Tests Ordered: Current medicines are reviewed at length with the patient today.  Concerns regarding medicines are outlined above.   Disposition: Follow-up with Dr. Domenic Polite or APP 3 months  Signed, Levell July, NP 02/23/2020 11:16 AM    [facility], [place], [place] [address] Phone: [phone]; Fax: [phone]

## 2020-02-23 ENCOUNTER — Ambulatory Visit (INDEPENDENT_AMBULATORY_CARE_PROVIDER_SITE_OTHER): Payer: BC Managed Care – PPO | Admitting: Family Medicine

## 2020-02-23 ENCOUNTER — Encounter: Payer: Self-pay | Admitting: Family Medicine

## 2020-02-23 VITALS — BP 130/80 | HR 75 | Ht 63.0 in | Wt 197.0 lb

## 2020-02-23 DIAGNOSIS — R0602 Shortness of breath: Secondary | ICD-10-CM

## 2020-02-23 DIAGNOSIS — R002 Palpitations: Secondary | ICD-10-CM | POA: Diagnosis not present

## 2020-02-23 DIAGNOSIS — R079 Chest pain, unspecified: Secondary | ICD-10-CM

## 2020-02-23 DIAGNOSIS — E782 Mixed hyperlipidemia: Secondary | ICD-10-CM

## 2020-02-23 DIAGNOSIS — I2510 Atherosclerotic heart disease of native coronary artery without angina pectoris: Secondary | ICD-10-CM

## 2020-02-23 MED ORDER — DILTIAZEM HCL 30 MG PO TABS
30.0000 mg | ORAL_TABLET | Freq: Two times a day (BID) | ORAL | 1 refills | Status: DC | PRN
Start: 2020-02-23 — End: 2020-03-18

## 2020-02-23 MED ORDER — METOPROLOL TARTRATE 50 MG PO TABS
50.0000 mg | ORAL_TABLET | Freq: Two times a day (BID) | ORAL | 1 refills | Status: DC
Start: 2020-02-23 — End: 2020-03-26

## 2020-02-23 NOTE — Patient Instructions (Addendum)
Your physician recommends that you schedule a follow-up appointment in: [place], NP  Your physician has recommended you make the following change in your medication:   INCREASE LOPRESSOR 50 MG TWICE DAILY   START DILTIAZEM 30 MG AS NEEDED FOR PALPITATIONS  Thank you for choosing [facility]!!

## 2020-02-27 ENCOUNTER — Ambulatory Visit: Payer: Medicare Other | Admitting: Cardiology

## 2020-02-27 DIAGNOSIS — Z1231 Encounter for screening mammogram for malignant neoplasm of breast: Secondary | ICD-10-CM | POA: Diagnosis not present

## 2020-03-04 ENCOUNTER — Telehealth: Payer: Self-pay | Admitting: Family Medicine

## 2020-03-04 NOTE — Telephone Encounter (Signed)
Pt verified she was taking Lopressor 50 bid as per her recent office appt - LM with Claiborne Billings at [facility]

## 2020-03-04 NOTE — Telephone Encounter (Signed)
Claiborne Billings with [facility] called in regards to patients metoprolol tartrate (LOPRESSOR) 50 MG tablet  Patient stated to her that she is taking 4 tabs daily.  Please call [phone] Claiborne Billings to verify

## 2020-03-13 NOTE — Patient Instructions (Addendum)
Below is our plan:  We will continue current treatment plan. Follow up closely with care team. I will have you come back to see Dr Felecia Shelling in 1 year to check in. Let us know if you need [place] sooner.   Please make sure you are staying well hydrated. I recommend 50-60 ounces daily. Well balanced diet and regular exercise encouraged.    Please continue follow up with care team as directed.   Follow up with Dr Felecia Shelling in 6 months   You may receive a survey regarding today's visit. I encourage you to leave honest feed back as I do use this information to improve patient care. Thank you for seeing me today!      Multiple Sclerosis Multiple sclerosis (MS) is a disease of the brain, spinal cord, and optic nerves (central nervous system). It causes the body's disease-fighting (immune) system to destroy the protective covering (myelin sheath) around nerves in the brain. When this happens, signals (nerve impulses) going to and from the brain and spinal cord do not get sent properly or may not get sent at all. There are several types of MS:  Relapsing-remitting MS. This is the most common type. This causes sudden attacks of symptoms. After an attack, you may recover completely until the next attack, or some symptoms may remain permanently.  Secondary progressive MS. This usually develops after the onset of relapsing-remitting MS. Similar to relapsing-remitting MS, this type also causes sudden attacks of symptoms. Attacks may be less frequent, but symptoms slowly get worse (progress) over time.  Primary progressive MS. This causes symptoms that steadily progress over time. This type of MS does not cause sudden attacks of symptoms. The age of onset of MS varies, but it often develops between 70-80 years of age. MS is a lifelong (chronic) condition. There is no cure, but treatment can help slow down the progression of the disease. What are the causes? The cause of this condition is not known. What increases  the risk? You are more likely to develop this condition if:  You are a woman.  You have a relative with MS. However, the condition is not passed from parent to child (inherited).  You have a lack (deficiency) of vitamin D.  You smoke. MS is more common in the [place] than in the [place]. What are the signs or symptoms? Relapsing-remitting and secondary progressive MS cause symptoms to occur in episodes or attacks that may last weeks to months. There may be long periods between attacks in which there are almost no symptoms. Primary progressive MS causes symptoms to steadily progress after they develop. Symptoms of MS vary because of the many different ways it affects the central nervous system. The main symptoms include:  Vision problems and eye pain.  Numbness.  Weakness.  Inability to move your arms, hands, feet, or legs (paralysis).  Balance problems.  Shaking that you cannot control (tremors).  Muscle spasms.  Problems with thinking (cognitive changes). MS can also cause symptoms that are associated with the disease, but are not always the direct result of an MS attack. They may include:  Inability to control urination or bowel movements (incontinence).  Headaches.  Fatigue.  Inability to tolerate heat.  Emotional changes.  Depression.  Pain. How is this diagnosed? This condition is diagnosed based on:  Your symptoms.  A neurological exam. This involves checking central nervous system function, such as nerve function, reflexes, and coordination.  MRIs of the brain and spinal cord.  Lab tests, including a lumbar puncture that tests the fluid that surrounds the brain and spinal cord (cerebrospinal fluid).  Tests to measure the electrical activity of the brain in response to stimulation (evoked potentials). How is this treated? There is no cure for MS, but medicines can help decrease the number and frequency of attacks and help  relieve nuisance symptoms. Treatment options may include:  Medicines that reduce the frequency of attacks. These medicines may be given by injection, by mouth (orally), or through an IV.  Medicines that reduce inflammation (steroids). These may provide short-term relief of symptoms.  Medicines to help control pain, depression, fatigue, or incontinence.  Vitamin D, if you have a deficiency.  Using devices to help you move around (assistive devices), such as braces, a cane, or a walker.  Physical therapy to strengthen and stretch your muscles.  Occupational therapy to help you with everyday tasks.  Alternative or complementary treatments such as exercise, massage, or acupuncture. Follow these instructions at home:  Take over-the-counter and prescription medicines only as told by your health care provider.  Do not drive or use heavy machinery while taking prescription pain medicine.  Use assistive devices as recommended by your physical therapist or your health care provider.  Exercise as directed by your health care provider.  Return to your normal activities as told by your health care provider. Ask your health care provider what activities are safe for you.  Reach out for support. Share your feelings with friends, family, or a support group.  Keep all follow-up visits as told by your health care provider and therapists. This is important. Where to find more information  National Multiple Sclerosis Society: https://www.nationalmssociety.org Contact a health care provider if:  You feel depressed.  You develop new pain or numbness.  You have tremors.  You have problems with sexual function. Get help right away if:  You develop paralysis.  You develop numbness.  You have problems with your bladder or bowel function.  You develop double vision.  You lose vision in one or both eyes.  You develop suicidal thoughts.  You develop severe confusion. If you ever feel  like you may hurt yourself or others, or have thoughts about taking your own life, get help right away. You can go to your nearest emergency department or call:  Your local emergency services (911 in the [place]  A suicide crisis helpline, such as the [facility] at 450-849-7153. This is open 24 hours a day. Summary  Multiple sclerosis (MS) is a disease of the central nervous system that causes the body's immune system to destroy the protective covering (myelin sheath) around nerves in the brain.  There are 3 types of MS: relapsing-remitting, secondary progressive, and primary progressive. Relapsing-remitting and secondary progressive MS cause symptoms to occur in episodes or attacks that may last weeks to months. Primary progressive MS causes symptoms to steadily progress after they develop.  There is no cure for MS, but medicines can help decrease the number and frequency of attacks and help relieve nuisance symptoms. Treatment may also include physical or occupational therapy.  If you develop numbness, paralysis, vision problems, or other neurological symptoms, get help right away. This information is not intended to replace advice given to you by your health care provider. Make sure you discuss any questions you have with your health care provider. Document Revised: 04/02/2017 Document Reviewed: 06/29/2016 Elsevier Patient Education  2020 [employer].

## 2020-03-13 NOTE — Progress Notes (Signed)
Chief Complaint  Patient presents with  . Follow-up    rm 2  . Multiple Sclerosis    pt said she has no new sx.     HISTORY OF PRESENT ILLNESS: Today 03/18/20  Mary Strong is a 59 y.o. female here today for follow up for RRMS. She is not on DMT at this time. Diagnosed in 2005 with transverse myelitis started on Avonex and switched to Tysabri in 2010. Stopped Tysabri due to elevated JCV and Tecfidera used for about 2-3 years. Tysabri restarted in 2016 but then discontinued again due to patient preference and positive JCV. Last on DMT 2016. MRI stable 10/2019.   She is doing well, today. She denies new or exacerbating symptoms. Gait is stable. No falls.   She uses Valium sparingly for muscle spasms and insomnia. She reports that she usually takes it once every couple of weeks. Symptoms seem worse with heat. She also continues duloxetine 60mg  daily gabapentin 600mg  TID for dysesthesias and mood. PCP has been witting medications for her. Mood is stable. She does have some brain fog. She is sleeping well.   She continues to follow closely with cardiology and GI. She is off Plavix. She continues asa 81mg . Metoprolol was increased. GI is planning to do EGD/colonoscopy.    HISTORY (copied from previous note)  Mary Strong is a 59 y.o. female here today for follow up for RRMS. She is not currently on DMT. She feels that she is doing fairly well , overall. She has one concern. She has felt a strong heat sensation of the right posterior thigh. She had this sensation in the past, prior to her diagnosis of MS. Sensation is so strong at times that she feels that her leg may be wet or bleeding. She feels that the warmness will radiate to her vagina at times. Prior to diagnosis of MS, she was experiencing a sensation of a zipper moving across her abdomen. She has not noted this sensation. She does not feel that burning is worsening with time but it is not resolving. She has a history of  chronic back pain but has not had any precious radicular symptoms.   Gait is unchanged. She denies weakness. No new numbness. No vision changes. She has not noticed any changes in urinary function. She has frequency and urgency but does not feel this has changed.   Fatigue continues to be a concern but relates this to anemia, followed by GI. She has a history of vitamin D def but not currently on supplements.   She uses Valium 5mg  up to TID for spacticity and help with insomnia. Last refill written on 07/13/2019.   She takes gabapentin 600mg  TID for dysesthesias. She also continues duloxetine 60mg  daily for both pain, dysesthesia and mood. She does feel that these medications help.   She continues close follow up with cardiology. She feels that she is doing fairly well from a cardiac perspective. She denies chest pain. She is on asa 81mg , Plavix and statin therapy. She is seeing GI for constipation and anemia. She was taking Linzess but has not been able to afford this medication. Now she is using Miralax. She feels that her appetite has decreased due to constipation and feeling bloated. She is taking ferrous sulfate 2-3 times a week. She is planning to have a colonoscopy around 01/2020. GI wants to wait until she can stop blood thinners.    HISTORY: (copied from Dr Garth Bigness note on 03/16/2019)  Mary Strong is a49 y.o.woman with MS and related symptoms.   Update 03/16/2019: Her MS has been stable off of a DMT. She is walking about the same. She has left >right leg spasticity and is on valium. She has some urinary hesitancy/retention and urine sometimes shows WBC and she has had a few rounds of antibiotics but some cultures have been negative by her report. She felt tamsulosin helped the bladder mildly but she stopped due to being on so many pills. She Has constipation and is on Linzess (has not helped much).   Due to an abnormal EKG, she had a catheterization with  stenting of 2 arteries. In retrospect, she has had some chest pain but it was attributed to her known GERD. She was placed on Plavix. She is on Protonix for GERD (switched form Prilosec due to Plavix). Protonix may also interact with Plavix (making it less effective). She never had a benefit from zantac or pepcid (safer with Plavix) so she wants to stay on Protonix. Advised to try to reduce dose if she can.     REVIEW OF SYSTEMS: Out of a complete 14 system review of symptoms, the patient complains only of the following symptoms, dysesthesias, brain fog, and all other reviewed systems are negative.   ALLERGIES: Allergies  Allergen Reactions  . Shellfish Allergy Anaphylaxis  . Ciprofloxacin Hives  . Copaxone [Glatiramer Acetate] Hives  . Penicillins Hives and Rash    Bad headaches Did it involve swelling of the face/tongue/throat, SOB, or low BP? No Did it involve sudden or severe rash/hives, skin peeling, or any reaction on the inside of your mouth or nose? No Did you need to seek medical attention at a hospital or doctor's office? No When did it last happen?6 - 7 years If all above answers are "NO", may proceed with cephalosporin use.   . Vancomycin Itching     HOME MEDICATIONS: Outpatient Medications Prior to Visit  Medication Sig Dispense Refill  . aspirin 81 MG tablet Take 81 mg by mouth daily.    Marland Kitchen BIOTIN PO Take 500 mcg by mouth daily.    . calcium carbonate (TUMS - DOSED IN MG ELEMENTAL CALCIUM) 500 MG chewable tablet Chew 3 tablets by mouth daily as needed for indigestion or heartburn.    . diazepam (VALIUM) 5 MG tablet TAKE ONE TABLET BY MOUTH THREE TIMES DAILY AS NEEDED 90 tablet 5  . Dulaglutide (TRULICITY) 1.5 YK/9.9IP SOPN Inject 1.5 mg into the skin every Saturday.     . DULoxetine (CYMBALTA) 60 MG capsule Take 1 capsule (60 mg total) by mouth at bedtime. 30 capsule 11  . gabapentin (NEURONTIN) 600 MG tablet Take 1 tablet (600 mg total) by mouth 3  (three) times daily. 90 tablet 5  . levothyroxine (SYNTHROID) 112 MCG tablet Take 112 mcg by mouth daily before breakfast.    . metFORMIN (GLUCOPHAGE) 500 MG tablet Take 1 tablet (500 mg total) by mouth 2 (two) times daily.    . metoprolol tartrate (LOPRESSOR) 50 MG tablet Take 1 tablet (50 mg total) by mouth 2 (two) times daily. (Patient taking differently: Take 50 mg by mouth 2 (two) times daily. 100mg  2 in morning and 2 in the evening 4 total) 180 tablet 1  . mometasone (ELOCON) 0.1 % cream Apply 1 application topically daily as needed (psoriasis).     . nitroGLYCERIN (NITROSTAT) 0.4 MG SL tablet Place 1 tablet (0.4 mg total) under the tongue every 5 (five) minutes as needed. (Patient taking differently:  Place 0.4 mg under the tongue every 5 (five) minutes as needed for chest pain. ) 25 tablet 2  . pantoprazole (PROTONIX) 40 MG tablet Take 40 mg by mouth daily.     . clopidogrel (PLAVIX) 75 MG tablet TAKE ONE TABLET BY MOUTH ONCE DAILY 90 tablet 1  . diltiazem (CARDIZEM) 30 MG tablet Take 1 tablet (30 mg total) by mouth 2 (two) times daily as needed. 180 tablet 1  . rosuvastatin (CRESTOR) 5 MG tablet Take 1 tablet (5 mg total) by mouth every morning. 90 tablet 1   No facility-administered medications prior to visit.     PAST MEDICAL HISTORY: Past Medical History:  Diagnosis Date  . Anxiety   . Arthritis   . CAD (coronary artery disease)    Heavily calcified vessels, DES to CTO RCA and DES to mid LAD September 2020  . Constipation   . Depression   . Essential hypertension   . Fatty liver   . GERD (gastroesophageal reflux disease)   . History of migraine   . History of sleep apnea    Currently not using CPAP  . Hypothyroidism   . Multiple sclerosis ([place])   . Neurogenic bladder   . Neuropathy   . Palpitations   . Restless leg syndrome   . Skin cancer    Basal cell cancer on forehead, strong family hx of adenocarcinoma  . Type 2 diabetes mellitus ([place])      PAST SURGICAL  HISTORY: Past Surgical History:  Procedure Laterality Date  . CESAREAN SECTION     x 2  . CHOLECYSTECTOMY    . CORONARY ATHERECTOMY N/A 01/18/2019   Procedure: CORONARY ATHERECTOMY;  Surgeon: Martinique, Peter M, MD;  Location: [facility];  Service: Cardiovascular;  Laterality: N/A;  . CORONARY CTO INTERVENTION N/A 01/18/2019   Procedure: CORONARY CTO INTERVENTION;  Surgeon: Martinique, Peter M, MD;  Location: [facility];  Service: Cardiovascular;  Laterality: N/A;  . CORONARY STENT INTERVENTION N/A 01/18/2019   Procedure: CORONARY STENT INTERVENTION;  Surgeon: Martinique, Peter M, MD;  Location: [facility];  Service: Cardiovascular;  Laterality: N/A;  . ENDOMETRIAL ABLATION    . JOINT REPLACEMENT    . LEFT HEART CATH AND CORONARY ANGIOGRAPHY N/A 01/10/2019   Procedure: LEFT HEART CATH AND CORONARY ANGIOGRAPHY;  Surgeon: Belva Crome, MD;  Location: [facility];  Service: Cardiovascular;  Laterality: N/A;  . TONSILLECTOMY    . TOTAL HIP ARTHROPLASTY Right 12/04/2015  . TOTAL HIP ARTHROPLASTY Right 12/04/2015   Procedure: TOTAL HIP ARTHROPLASTY ANTERIOR APPROACH;  Surgeon: Marybelle Killings, MD;  Location: [facility];  Service: Orthopedics;  Laterality: Right;  . TOTAL HIP ARTHROPLASTY Left 01/24/2016   Procedure: TOTAL HIP ARTHROPLASTY ANTERIOR APPROACH;  Surgeon: Marybelle Killings, MD;  Location: [facility];  Service: Orthopedics;  Laterality: Left;  anterior approach  . TUBAL LIGATION       FAMILY HISTORY: Family History  Problem Relation Age of Onset  . Multiple sclerosis Mother   . Arthritis/Rheumatoid Mother   . Psoriasis Father      SOCIAL HISTORY: Social History   Socioeconomic History  . Marital status: Divorced    Spouse name: Not on file  . Number of children: Not on file  . Years of education: Not on file  . Highest education level: Not on file  Occupational History  . Not on file  Tobacco Use  . Smoking status: Never Smoker  . Smokeless tobacco: Never  Used    Substance and Sexual Activity  . Alcohol use: Yes    Alcohol/week: 0.0 standard drinks    Comment: "none since 2014"  . Drug use: Yes    Types: Marijuana    Comment: none in 6 minths  . Sexual activity: Not Currently  Other Topics Concern  . Not on file  Social History Narrative  . Not on file   Social Determinants of Health   Financial Resource Strain:   . Difficulty of Paying Living Expenses: Not on file  Food Insecurity:   . Worried About Charity fundraiser in the Last Year: Not on file  . Ran Out of Food in the Last Year: Not on file  Transportation Needs:   . Lack of Transportation (Medical): Not on file  . Lack of Transportation (Non-Medical): Not on file  Physical Activity:   . Days of Exercise per Week: Not on file  . Minutes of Exercise per Session: Not on file  Stress:   . Feeling of Stress : Not on file  Social Connections:   . Frequency of Communication with Friends and Family: Not on file  . Frequency of Social Gatherings with Friends and Family: Not on file  . Attends Religious Services: Not on file  . Active Member of Clubs or Organizations: Not on file  . Attends Archivist Meetings: Not on file  . Marital Status: Not on file  Intimate Partner Violence:   . Fear of Current or Ex-Partner: Not on file  . Emotionally Abused: Not on file  . Physically Abused: Not on file  . Sexually Abused: Not on file      PHYSICAL EXAM  Vitals:   03/18/20 1112  BP: 117/74  Pulse: 72  Weight: 198 lb (89.8 kg)  Height: 5\' 3"  (1.6 m)   Body mass index is 35.07 kg/m.   Generalized: Well developed, in no acute distress  Cardiology: normal rate and rhythm, no murmur auscultated  Respiratory: clear to auscultation bilaterally    Neurological examination  Mentation: Alert oriented to time, place, history taking. Follows all commands speech and language fluent Cranial nerve II-XII: Pupils were equal round reactive to light. Extraocular movements were  full, visual field were full on confrontational test. Facial sensation and strength were normal.  Motor: The motor testing reveals 5 over 5 strength of all 4 extremities. Good symmetric motor tone is noted throughout.  Sensory: Sensory testing is intact to soft touch on all 4 extremities. No evidence of extinction is noted.  Gait and station: Gait is normal.  Reflexes: Deep tendon reflexes are symmetric and normal bilaterally.     DIAGNOSTIC DATA (LABS, IMAGING, TESTING) - I reviewed patient records, labs, notes, testing and imaging myself where available.  Lab Results  Component Value Date   WBC 7.7 05/24/2019   HGB 10.9 (L) 08/17/2019   HCT 35.0 08/17/2019   MCV 72.1 (L) 05/24/2019   PLT 282 05/24/2019      Component Value Date/Time   NA 140 02/27/2019 1428   K 4.5 02/27/2019 1428   CL 106 02/27/2019 1428   CO2 26 02/27/2019 1428   GLUCOSE 95 02/27/2019 1428   BUN 12 02/27/2019 1428   CREATININE 1.02 02/27/2019 1428   CALCIUM 9.3 02/27/2019 1428   PROT 6.7 02/27/2019 1428   PROT 5.8 (L) 02/12/2015 1337   ALBUMIN 3.9 01/17/2016 1500   ALBUMIN 3.9 02/12/2015 1337   AST 19 02/27/2019 1428   ALT 13 02/27/2019 1428  ALKPHOS 70 01/17/2016 1500   BILITOT 0.4 02/27/2019 1428   BILITOT 0.3 02/12/2015 1337   GFRNONAA 61 02/27/2019 1428   GFRAA 71 02/27/2019 1428   Lab Results  Component Value Date   CHOL  05/10/2010    143        ATP III CLASSIFICATION:  <200     mg/dL   Desirable  200-239  mg/dL   Borderline High  >=240    mg/dL   High          HDL 22 (L) 05/10/2010   LDLCALC  05/10/2010    69        Total Cholesterol/HDL:CHD Risk Coronary Heart Disease Risk Table                     Men   Women  1/2 Average Risk   3.4   3.3  Average Risk       5.0   4.4  2 X Average Risk   9.6   7.1  3 X Average Risk  23.4   11.0        Use the calculated Patient Ratio above and the CHD Risk Table to determine the patient's CHD Risk.        ATP III CLASSIFICATION (LDL):   <100     mg/dL   Optimal  100-129  mg/dL   Near or Above                    Optimal  130-159  mg/dL   Borderline  160-189  mg/dL   High  >190     mg/dL   Very High   TRIG 258 (H) 05/10/2010   CHOLHDL 6.5 05/10/2010   Lab Results  Component Value Date   HGBA1C 5.2 01/17/2016   No results found for: [id number] Lab Results  Component Value Date   TSH 0.056 (L) 05/10/2010      ASSESSMENT AND PLAN  59 y.o. year old female  has a past medical history of Anxiety, Arthritis, CAD (coronary artery disease), Constipation, Depression, Essential hypertension, Fatty liver, GERD (gastroesophageal reflux disease), History of migraine, History of sleep apnea, Hypothyroidism, Multiple sclerosis ([place]), Neurogenic bladder, Neuropathy, Palpitations, Restless leg syndrome, Skin cancer, and Type 2 diabetes mellitus ([place]). here with   Multiple sclerosis ([place])  Spastic diplegia ([facility])  Dysesthesia  Chronic fatigue  Depression, unspecified depression type  Letrice continues to do very well. She has been off DMT since 2016. MRI stable in 10/2019. She was advised to continue Valium as needed, duloxetine 60mg  daily and gabapentin 600mg  TID. PCP now witting medications. She will continue healthy lifestyle habits. She will continue close follow up with PCP. I will have her se Dr Felecia Shelling in 1 year, sooner for any new or worsening symptoms. She verbalizes understanding and agreement with this plan.   I spent 25 minutes of face-to-face and non-face-to-face time with patient.  This included previsit chart review, lab review, study review, order entry, electronic health record documentation, patient education.    Debbora Presto, MSN, FNP-C 03/18/2020, 11:16 AM  [facility] [address] [place], [place] [address] [phone]

## 2020-03-18 ENCOUNTER — Ambulatory Visit (INDEPENDENT_AMBULATORY_CARE_PROVIDER_SITE_OTHER): Payer: BC Managed Care – PPO | Admitting: Family Medicine

## 2020-03-18 ENCOUNTER — Encounter: Payer: Self-pay | Admitting: Family Medicine

## 2020-03-18 ENCOUNTER — Other Ambulatory Visit: Payer: Self-pay

## 2020-03-18 VITALS — BP 117/74 | HR 72 | Ht 63.0 in | Wt 198.0 lb

## 2020-03-18 DIAGNOSIS — R208 Other disturbances of skin sensation: Secondary | ICD-10-CM | POA: Diagnosis not present

## 2020-03-18 DIAGNOSIS — R5382 Chronic fatigue, unspecified: Secondary | ICD-10-CM | POA: Diagnosis not present

## 2020-03-18 DIAGNOSIS — G801 Spastic diplegic cerebral palsy: Secondary | ICD-10-CM

## 2020-03-18 DIAGNOSIS — F32A Depression, unspecified: Secondary | ICD-10-CM | POA: Diagnosis not present

## 2020-03-18 DIAGNOSIS — G35 Multiple sclerosis: Secondary | ICD-10-CM | POA: Diagnosis not present

## 2020-03-18 NOTE — Progress Notes (Signed)
I have read the note, and I agree with the clinical assessment and plan.  Kinston Magnan A. Vernisha Bacote, MD, PhD, FAAN Certified in Neurology, Clinical Neurophysiology, Sleep Medicine, Pain Medicine and Neuroimaging  [facility] [address], [address] [place],  [address] [phone]  

## 2020-03-26 ENCOUNTER — Other Ambulatory Visit: Payer: Self-pay | Admitting: *Deleted

## 2020-03-26 ENCOUNTER — Telehealth: Payer: Self-pay | Admitting: *Deleted

## 2020-03-26 MED ORDER — METOPROLOL TARTRATE 50 MG PO TABS: 50.0000 mg | ORAL_TABLET | Freq: Two times a day (BID) | ORAL | 1 refills | Status: DC

## 2020-03-26 NOTE — Telephone Encounter (Signed)
Thank you :)

## 2020-03-26 NOTE — Telephone Encounter (Signed)
Reports inadvertently taking (2) 50 mg BID of metoprolol tartrate for the past 1 month. Says she thought they were 25 mg tablets. Reports HR is 74. Reports no dizziness, sob, chest pain or palpitation. Reports that her symptoms were stable and well controlled when she was taking 50 mg BID. Patient says she will restart 50 mg BID and apologized for the confusion.

## 2020-04-01 ENCOUNTER — Other Ambulatory Visit: Payer: Self-pay | Admitting: Neurology

## 2020-05-07 DIAGNOSIS — Z20828 Contact with and (suspected) exposure to other viral communicable diseases: Secondary | ICD-10-CM | POA: Diagnosis not present

## 2020-05-27 ENCOUNTER — Ambulatory Visit: Payer: Medicare Other | Admitting: Family Medicine

## 2020-06-10 NOTE — Progress Notes (Signed)
Cardiology Office Note  Date: 06/11/2020   ID: Mary Strong, DOB 1960/08/26, MRN [id number]  PCP:  Caryl Bis, MD  Cardiologist:  Rozann Lesches, MD Electrophysiologist:  None   Chief Complaint: Palpitations, racing heart, DOE, chest pain  History of Present Illness: Mary Strong is a 60 y.o. female with a history of CAD, HTN, palpitations, type II DM, fatty liver disease, GERD, history of sleep apnea not using CPAP, multiple sclerosis.  Last encounter with Dr. Domenic Polite 08/22/2019 via telemedicine visit.  She stated she had been doing reasonably well.  Had only used two nitroglycerin since last assessment.  No progressive exertional symptoms.  Denied any bleeding problems on dual antiplatelet therapy.  She was deferring elective colonoscopy until September.  Lipids had been very well controlled with a last LDL at 30.  She was taking Crestor 5 mg every other day.  CAD with multivessel distribution with heavy calcification.  Status post DES to CTO RCA and mid LAD September 2020.  Reporting no progressive anginal symptoms on medical therapy.  She was continuing Lopressor, low-dose statin as well as aspirin, Plavix, and Crestor.  She is here for 69-month follow-up today.  She denies any recent acute illnesses or hospitalizations.  Denies any palpitations or arrhythmias.  Denies any anginal or exertional symptoms.  She states she may experience some chest pain if she misses her dose of metoprolol.  She denies any PND, orthopnea, stroke or TIA-like symptoms, bleeding, claudication, DVT or PE-like symptoms, or lower extremity edema.  She is having to take care of her mother-in-law who has been sick recently and had multiple hospital admissions over the last 6 weeks.  She states this may be the reason her blood pressure is up a little today.  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   Past Medical  History:  Diagnosis Date  . Anxiety   . Arthritis   . CAD (coronary artery disease)    Heavily calcified vessels, DES to CTO RCA and DES to mid LAD September 2020  . Constipation   . Depression   . Essential hypertension   . Fatty liver   . GERD (gastroesophageal reflux disease)   . History of migraine   . History of sleep apnea    Currently not using CPAP  . Hypothyroidism   . Multiple sclerosis ([place])   . Neurogenic bladder   . Neuropathy   . Palpitations   . Restless leg syndrome   . Skin cancer    Basal cell cancer on forehead, strong family hx of adenocarcinoma  . Type 2 diabetes mellitus ([place])     Past Surgical History:  Procedure Laterality Date  . CESAREAN SECTION     x 2  . CHOLECYSTECTOMY    . CORONARY ATHERECTOMY N/A 01/18/2019   Procedure: CORONARY ATHERECTOMY;  Surgeon: Martinique, Peter M, MD;  Location: [facility];  Service: Cardiovascular;  Laterality: N/A;  . CORONARY CTO INTERVENTION N/A 01/18/2019   Procedure: CORONARY CTO INTERVENTION;  Surgeon: Martinique, Peter M, MD;  Location: [facility];  Service: Cardiovascular;  Laterality: N/A;  . CORONARY STENT INTERVENTION N/A 01/18/2019   Procedure: CORONARY STENT INTERVENTION;  Surgeon: Martinique, Peter M, MD;  Location: [facility];  Service: Cardiovascular;  Laterality: N/A;  . ENDOMETRIAL ABLATION    . JOINT REPLACEMENT    . LEFT HEART CATH  AND CORONARY ANGIOGRAPHY N/A 01/10/2019   Procedure: LEFT HEART CATH AND CORONARY ANGIOGRAPHY;  Surgeon: Belva Crome, MD;  Location: [facility];  Service: Cardiovascular;  Laterality: N/A;  . TONSILLECTOMY    . TOTAL HIP ARTHROPLASTY Right 12/04/2015  . TOTAL HIP ARTHROPLASTY Right 12/04/2015   Procedure: TOTAL HIP ARTHROPLASTY ANTERIOR APPROACH;  Surgeon: Marybelle Killings, MD;  Location: [facility];  Service: Orthopedics;  Laterality: Right;  . TOTAL HIP ARTHROPLASTY Left 01/24/2016   Procedure: TOTAL HIP ARTHROPLASTY ANTERIOR APPROACH;  Surgeon: Marybelle Killings,  MD;  Location: [facility];  Service: Orthopedics;  Laterality: Left;  anterior approach  . TUBAL LIGATION      Current Outpatient Medications  Medication Sig Dispense Refill  . aspirin 81 MG tablet Take 81 mg by mouth daily.    Marland Kitchen BIOTIN PO Take 500 mcg by mouth daily.    . calcium carbonate (TUMS - DOSED IN MG ELEMENTAL CALCIUM) 500 MG chewable tablet Chew 3 tablets by mouth daily as needed for indigestion or heartburn.    . diazepam (VALIUM) 5 MG tablet TAKE ONE TABLET BY MOUTH THREE TIMES DAILY AS NEEDED 90 tablet 5  . DULoxetine (CYMBALTA) 60 MG capsule Take 1 capsule (60 mg total) by mouth at bedtime. 30 capsule 11  . gabapentin (NEURONTIN) 600 MG tablet TAKE ONE TABLET BY MOUTH THREE TIMES DAILY 90 tablet 5  . levothyroxine (SYNTHROID) 112 MCG tablet Take 112 mcg by mouth daily before breakfast.    . metFORMIN (GLUCOPHAGE) 500 MG tablet Take 1 tablet (500 mg total) by mouth 2 (two) times daily.    . metoprolol tartrate (LOPRESSOR) 50 MG tablet Take 1 tablet (50 mg total) by mouth 2 (two) times daily. 180 tablet 1  . mometasone (ELOCON) 0.1 % cream Apply 1 application topically daily as needed (psoriasis).     . nitroGLYCERIN (NITROSTAT) 0.4 MG SL tablet Place 1 tablet (0.4 mg total) under the tongue every 5 (five) minutes as needed. (Patient taking differently: Place 0.4 mg under the tongue every 5 (five) minutes as needed for chest pain.) 25 tablet 2  . pantoprazole (PROTONIX) 40 MG tablet Take 40 mg by mouth daily.      No current facility-administered medications for this visit.   Allergies:  Shellfish allergy, Ciprofloxacin, Copaxone [glatiramer acetate], Penicillins, and Vancomycin   Social History: The patient  reports that she has never smoked. She has never used smokeless tobacco. She reports current alcohol use. She reports current drug use. Drug: Marijuana.   Family History: The patient's family history includes Arthritis/Rheumatoid in her mother; Multiple sclerosis in her mother;  Psoriasis in her father.   ROS:  Please see the history of present illness. Otherwise, complete review of systems is positive for none.  All other systems are reviewed and negative.   Physical Exam: VS:  BP 140/86   Pulse 67   Ht 5\' 3"  (1.6 m)   Wt 198 lb 6.4 oz (90 kg)   SpO2 98%   BMI 35.14 kg/m , BMI Body mass index is 35.14 kg/m.  Wt Readings from Last 3 Encounters:  06/11/20 198 lb 6.4 oz (90 kg)  03/18/20 198 lb (89.8 kg)  02/23/20 197 lb (89.4 kg)    General: Patient appears comfortable at rest. Neck: Supple, no elevated JVP or carotid bruits, no thyromegaly. Lungs: Clear to auscultation, nonlabored breathing at rest. Cardiac: Regular rate and rhythm, no S3 or significant systolic murmur, no pericardial rub. Extremities: No pitting edema,  distal pulses 2+. Skin: Warm and dry. Musculoskeletal: No kyphosis. Neuropsychiatric: Alert and oriented x3, affect grossly appropriate.  ECG:  An ECG dated 01/26/2020 was personally reviewed today and demonstrated:  Normal sinus rhythm, right bundle branch block rate of 77, possible anterior lateral infarct, age undetermined.  Recent Labwork: 08/17/2019: Hemoglobin 10.9     Component Value Date/Time   CHOL  05/10/2010 2350    143        ATP III CLASSIFICATION:  <200     mg/dL   Desirable  200-239  mg/dL   Borderline High  >=240    mg/dL   High          TRIG 258 (H) 05/10/2010 2350   HDL 22 (L) 05/10/2010 2350   CHOLHDL 6.5 05/10/2010 2350   VLDL 52 (H) 05/10/2010 2350   LDLCALC  05/10/2010 2350    69        Total Cholesterol/HDL:CHD Risk Coronary Heart Disease Risk Table                     Men   Women  1/2 Average Risk   3.4   3.3  Average Risk       5.0   4.4  2 X Average Risk   9.6   7.1  3 X Average Risk  23.4   11.0        Use the calculated Patient Ratio above and the CHD Risk Table to determine the patient's CHD Risk.        ATP III CLASSIFICATION (LDL):  <100     mg/dL   Optimal  100-129  mg/dL   Near or  Above                    Optimal  130-159  mg/dL   Borderline  160-189  mg/dL   High  >190     mg/dL   Very High    Other Studies Reviewed Today:  NST 02/01/2020 Study Result Narrative & Impression   No diagnostic ST segment changes to indicate ischemia.  Small, mild intensity, mid to basal inferolateral defect that is reversible and consistent with ischemia.  This is a low risk study.  Nuclear stress EF: 71%.     Zio Monitor 02/05/2020 Preliminary Findings Final Interpretation Patient had a min HR of 57 bpm, max HR of 167 bpm, and avg HR of 80 bpm. Predominant underlying rhythm was Sinus Rhythm. 5 Supraventricular Tachycardia runs occurred, the run with the fastest interval lasting 9 beats with a max rate of 167 bpm, the longest lasting 9 beats with an avg rate of 125 bpm. Supraventricular Tachycardia was detected within +/- 45 seconds of symptomatic patient event(s). Isolated SVEs were rare (<1.0%), SVE Couplets were rare (<1.0%), and SVE Triplets were rare (<1.0%). Isolated VEs were rare (<1.0%), VE Couplets were rare (<1.0%), and no VE Triplets were present.  Echocardiogram 02/01/2020 1. Left ventricular ejection fraction, by estimation, is 60 to 65%. The left ventricle has normal function. The left ventricle has no regional wall motion abnormalities. There is moderate left ventricular hypertrophy. Left ventricular diastolic parameters are indeterminate. 2. Right ventricular systolic function is normal. The right ventricular size is normal. There is normal pulmonary artery systolic pressure. The estimated right ventricular systolic pressure is [id number] mmHg. 3. The mitral valve is grossly normal. Trivial mitral valve regurgitation. 4. The aortic valve is tricuspid. Aortic valve regurgitation is not visualized. 5. The inferior vena cava is  normal in size with greater than 50% respiratory variability, suggesting right atrial pressure of 3 mmHg.   Cardiac catheterization  01/10/2019:  Heavy three-vessel coronary calcification.  Normal left main.  Mid LAD within a heavily calcified segment 50 to 60% followed by 85% tandem stenoses. Supplies collaterals to RCA via septal perforators and apical segment.  Circumflex gives 3 obtuse marginal branches. Second obtuse marginal contains mid vessel 40% narrowing. Supplies collaterals to distal RCA.  Total occlusion in the mid vessel within a heavily calcified segment. Total occlusion appears to be a long segment.  Normal left ventricular size and function. EF 60%.  RECOMMENDATIONS:   Aggressive preventive therapy driving LDL down to 55 with PCSK9 therapy since she seems to be intolerant to statin therapy. Also consider SGLT2 therapy for diabetes.  Anti-ischemic therapy as needed to control symptoms.  Consider surgical revascularization given diabetes, severe hyperlipidemia, significant involvement of LAD (FFR by CTA). The other option would be RCA CTO PCI followed in a staged manner by LAD orbital atherectomy and stenting. Since the RCA is well collateralized, and the LAD is involved in the mid segment, I would recommend percutaneous intervention prior to subjecting her to surgery.  PCI 01/18/2019:  Prox LAD to Mid LAD lesion is 55% stenosed.  Mid LAD lesion is 85% stenosed.  A drug-eluting stent was successfully placed using a STENT SYNERGY DES 3X38.  Post intervention, there is a 0% residual stenosis.  Prox RCA lesion is 100% stenosed.  A drug-eluting stent was successfully placed using a STENT SYNERGY DES 3.5X20.  Post intervention, there is a 0% residual stenosis.  1. Successful CTO PCI of the proximal RCA with rotational atherectomy and DES x 1 2. Successful PCI of the mid LAD with rotational atherectomy and DES x 1.   Plan: DAPT for one year. Will observe overnight. Anticipate DC in am if stable. Diagnostic Dominance: Right  Intervention       Assessment and Plan:   1.  Palpitations Denies any recent palpitations or arrhythmias.  Continue metoprolol 50 mg p.o. twice daily.  2. Chest pain, unspecified type States she does not typically have any anginal symptoms but recently had missed a dose of metoprolol and started having some chest discomfort.  States it was relieved after taking metoprolol.  Otherwise no anginal or exertional symptoms.  History of stent placement to LAD and RCA 2020.  Continue aspirin 81 mg daily, Nitroglycerin sublingual as needed.   3. CAD in native artery Cardiac catheterization on 01/18/2019 demonstrating proximal LAD to mid LAD 55% stenosed.  Mid LAD 85% stenosis.  DES successfully placed with postintervention stenosis of 0.  Proximal RCA lesion 100% stenosis.  DES successfully placed with post residual stenosis of 0.  Continue aspirin 81 mg, nitroglycerin 0.4 mg sublingual as needed.  Continue metoprolol 50 mg p.o. twice daily.  4. Hyperlipidemia, mixed Continue Crestor 5 mg daily. Lipid panel on 01/19/2020: Total cholesterol 135, triglycerides 284, HDL 31, LDL 59.    5. SOB (shortness of breath) Currently denies any recent exertional symptoms, DOE/SOB.  Recent echo EF 60 to 65%, moderate LVH, trivial MR.  Patient states symptoms are better since increasing a.m. dose of metoprolol.  Continue metoprolol 50 mg p.o. twice daily.  Medication Adjustments/Labs and Tests Ordered: Current medicines are reviewed at length with the patient today.  Concerns regarding medicines are outlined above.   Disposition: Follow-up with Dr. Domenic Polite or APP 6 months  Signed, Levell July, NP 06/11/2020 11:54 AM    [facility] at [place], [place], [place] [address] Phone: [phone]; Fax: [phone]

## 2020-06-11 ENCOUNTER — Encounter: Payer: Self-pay | Admitting: Family Medicine

## 2020-06-11 ENCOUNTER — Ambulatory Visit (INDEPENDENT_AMBULATORY_CARE_PROVIDER_SITE_OTHER): Payer: BC Managed Care – PPO | Admitting: Family Medicine

## 2020-06-11 VITALS — BP 140/86 | HR 67 | Ht 63.0 in | Wt 198.4 lb

## 2020-06-11 DIAGNOSIS — E782 Mixed hyperlipidemia: Secondary | ICD-10-CM | POA: Diagnosis not present

## 2020-06-11 DIAGNOSIS — R079 Chest pain, unspecified: Secondary | ICD-10-CM

## 2020-06-11 DIAGNOSIS — R002 Palpitations: Secondary | ICD-10-CM | POA: Diagnosis not present

## 2020-06-11 DIAGNOSIS — R0602 Shortness of breath: Secondary | ICD-10-CM

## 2020-06-11 DIAGNOSIS — I2510 Atherosclerotic heart disease of native coronary artery without angina pectoris: Secondary | ICD-10-CM | POA: Diagnosis not present

## 2020-06-11 NOTE — Patient Instructions (Signed)
Medication Instructions:  Continue all current medications.   Labwork: none  Testing/Procedures: none  Follow-Up: 6 months   Any Other Special Instructions Will Be Listed Below (If Applicable).   If you need a refill on your cardiac medications before your next appointment, please call your pharmacy.  

## 2020-08-19 ENCOUNTER — Other Ambulatory Visit: Payer: Self-pay | Admitting: *Deleted

## 2020-08-19 MED ORDER — DIAZEPAM 5 MG PO TABS: ORAL_TABLET | ORAL | 5 refills | Status: AC

## 2020-08-19 NOTE — Progress Notes (Signed)
Meds ordered this encounter  Medications  . diazepam (VALIUM) 5 MG tablet    Sig: TAKE ONE TABLET BY MOUTH THREE TIMES DAILY AS NEEDED    Dispense:  90 tablet    Refill:  5    Dr. Felecia Shelling out of office, Dr. Krista Blue covering MD

## 2020-08-26 ENCOUNTER — Other Ambulatory Visit: Payer: Self-pay | Admitting: Family Medicine

## 2020-09-23 ENCOUNTER — Other Ambulatory Visit: Payer: Self-pay | Admitting: *Deleted

## 2020-09-23 ENCOUNTER — Encounter: Payer: Self-pay | Admitting: Family Medicine

## 2020-09-23 MED ORDER — GABAPENTIN 600 MG PO TABS: 600.0000 mg | ORAL_TABLET | Freq: Three times a day (TID) | ORAL | 5 refills | Status: DC

## 2020-09-25 ENCOUNTER — Encounter (INDEPENDENT_AMBULATORY_CARE_PROVIDER_SITE_OTHER): Payer: Self-pay | Admitting: *Deleted

## 2020-10-10 ENCOUNTER — Other Ambulatory Visit: Payer: Self-pay

## 2020-10-10 ENCOUNTER — Encounter (INDEPENDENT_AMBULATORY_CARE_PROVIDER_SITE_OTHER): Payer: Self-pay | Admitting: Gastroenterology

## 2020-10-10 ENCOUNTER — Other Ambulatory Visit (INDEPENDENT_AMBULATORY_CARE_PROVIDER_SITE_OTHER): Payer: Self-pay

## 2020-10-10 ENCOUNTER — Encounter (INDEPENDENT_AMBULATORY_CARE_PROVIDER_SITE_OTHER): Payer: Self-pay

## 2020-10-10 ENCOUNTER — Ambulatory Visit (INDEPENDENT_AMBULATORY_CARE_PROVIDER_SITE_OTHER): Payer: BC Managed Care – PPO | Admitting: Gastroenterology

## 2020-10-10 DIAGNOSIS — K589 Irritable bowel syndrome without diarrhea: Secondary | ICD-10-CM

## 2020-10-10 DIAGNOSIS — D509 Iron deficiency anemia, unspecified: Secondary | ICD-10-CM | POA: Insufficient documentation

## 2020-10-10 NOTE — H&P (View-Only) (Signed)
Maylon Peppers, M.D. Gastroenterology & Hepatology [facility] [address] [place], [place] [address]  Primary Care Physician: Caryl Bis, MD [place] [address]  I will communicate my assessment and recommendations to the referring MD via EMR.  Problems: IBS Iron deficiency anemia Medication induced constipation  History of Present Illness: Mary Strong is a 60 y.o. female with past medical history of anxiety, coronary artery disease status post stent placement, depression, hypertension, fatty liver, GERD, multiple sclerosis complicated by neurogenic bladder and neuropathy, restless leg syndrome and diabetes type 2, who presents for follow up of iron deficiency anemia, abdominal pain and constipation.  The patient was last seen on 05/24/2019. At that time, the patient was being evaluated for iron deficiency anemia but given recent placement of stent and being on DAPT, decision was made to hold on proceeding with with EGD and colonoscopy at that time.  She was also started on Linzess 145 mcg every day.  Patient reports that she was on Trulicity in the past and she reports that it caused constipation previously.  Since she noted this change, she stopped using the medication which led to an improvement in the frequency of her bowel movements. She reports she initially had some increase in her appetite. However, the patient has presented decreased appetite again recently.  In terms of her iron deficiency anemia, she reports that she tried taking the PO iron, but it caused some nausea, headache and abdominal cramping with abdominal fullness. Currently she is taking the oral iron every day, but will try to take every other day as she has not been able to tolerate adequately.  She denies having any episodes of melena or hematochezia.  She also endorses having some frequent bloating and abdominal pain in her upper abdomen  which she describes as an intermittent ache. She has only tried avoiding meat as it makes her bloating worse. Currently has a BM every day.   She is taking gabapentin for her MS only. Her MS is not considered to be active at the moment.  The patient denies having any nausea, vomiting, fever, chills, hematemesis, diarrhea, jaundice, pruritus or weight loss.  Most recent blood work-up from her PCP was performed on 09/18/2020 which showed an iron of 31, iron saturation 9% decreased, ferritin 12 decreased, vitamin E72 094 and folic acid of 14 normal, TIBC 347, CBC with hemoglobin of 10.5, MCV 78, platelets of 203, white blood cell count 5.6, CMP showing AST of 29, ALT of 23, alkaline phosphatase 88, total bilirubin 0.3, normal electrolytes and renal function with glucose of 241.  Last Colonoscopy: per patient last in 2001, normal per patient but no report available Last Endoscopy: per patient last in 2001, normal per patient but no report available  Fhx: grandmother pancreatic cancer, aunt stomach cancer, multiple cousins with colon cancer  Past Medical History: Past Medical History:  Diagnosis Date   Anxiety    Arthritis    CAD (coronary artery disease)    Heavily calcified vessels, DES to CTO RCA and DES to mid LAD September 2020   Constipation    Depression    Essential hypertension    Fatty liver    GERD (gastroesophageal reflux disease)    History of migraine    History of sleep apnea    Currently not using CPAP   Hypothyroidism    Multiple sclerosis (HCC)    Neurogenic bladder    Neuropathy  Palpitations    Restless leg syndrome    Skin cancer    Basal cell cancer on forehead, strong family hx of adenocarcinoma   Type 2 diabetes mellitus (HCC)     Past Surgical History: Past Surgical History:  Procedure Laterality Date   CESAREAN SECTION     x 2   CHOLECYSTECTOMY     CORONARY ATHERECTOMY N/A 01/18/2019   Procedure: CORONARY ATHERECTOMY;  Surgeon: Martinique, Peter M, MD;   Location: [place] [facility];  Service: Cardiovascular;  Laterality: N/A;   CORONARY CTO INTERVENTION N/A 01/18/2019   Procedure: CORONARY CTO INTERVENTION;  Surgeon: Martinique, Peter M, MD;  Location: [place] [facility];  Service: Cardiovascular;  Laterality: N/A;   CORONARY STENT INTERVENTION N/A 01/18/2019   Procedure: CORONARY STENT INTERVENTION;  Surgeon: Martinique, Peter M, MD;  Location: [place] [facility];  Service: Cardiovascular;  Laterality: N/A;   ENDOMETRIAL ABLATION     JOINT REPLACEMENT     LEFT HEART CATH AND CORONARY ANGIOGRAPHY N/A 01/10/2019   Procedure: LEFT HEART CATH AND CORONARY ANGIOGRAPHY;  Surgeon: Belva Crome, MD;  Location: [place] [facility];  Service: Cardiovascular;  Laterality: N/A;   TONSILLECTOMY     TOTAL HIP ARTHROPLASTY Right 12/04/2015   TOTAL HIP ARTHROPLASTY Right 12/04/2015   Procedure: TOTAL HIP ARTHROPLASTY ANTERIOR APPROACH;  Surgeon: Marybelle Killings, MD;  Location: [place];  Service: Orthopedics;  Laterality: Right;   TOTAL HIP ARTHROPLASTY Left 01/24/2016   Procedure: TOTAL HIP ARTHROPLASTY ANTERIOR APPROACH;  Surgeon: Marybelle Killings, MD;  Location: [place];  Service: Orthopedics;  Laterality: Left;  anterior approach   TUBAL LIGATION      Family History: Family History  Problem Relation Age of Onset   Multiple sclerosis Mother    Arthritis/Rheumatoid Mother    Psoriasis Father     Social History: Social History   Tobacco Use  Smoking Status Never  Smokeless Tobacco Never   Social History   Substance and Sexual Activity  Alcohol Use Yes   Alcohol/week: 0.0 standard drinks   Comment: "none since 2014"   Social History   Substance and Sexual Activity  Drug Use Not Currently   Comment: none in 6 minths    Allergies: Allergies  Allergen Reactions   Shellfish Allergy Anaphylaxis   Ciprofloxacin Hives   Copaxone [Glatiramer Acetate] Hives   Penicillins Hives and Rash    Bad headaches Did it involve swelling of the face/tongue/throat, SOB, or  low BP? No Did it involve sudden or severe rash/hives, skin peeling, or any reaction on the inside of your mouth or nose? No Did you need to seek medical attention at a hospital or doctor's office? No When did it last happen?      6 - 7 years If all above answers are "NO", may proceed with cephalosporin use.    Vancomycin Itching    Medications: Current Outpatient Medications  Medication Sig Dispense Refill   aspirin 81 MG tablet Take 81 mg by mouth daily.     BIOTIN PO Take 500 mcg by mouth daily.     calcium carbonate (TUMS - DOSED IN MG ELEMENTAL CALCIUM) 500 MG chewable tablet Chew 3 tablets by mouth daily as needed for indigestion or heartburn.     diazepam (VALIUM) 5 MG tablet TAKE ONE TABLET BY MOUTH THREE TIMES DAILY AS NEEDED 90 tablet 5   DULoxetine (CYMBALTA) 60 MG capsule Take 1 capsule (60 mg total) by mouth at bedtime. 30 capsule 11  gabapentin (NEURONTIN) 600 MG tablet Take 1 tablet (600 mg total) by mouth 3 (three) times daily. 90 tablet 5   levothyroxine (SYNTHROID) 112 MCG tablet Take 112 mcg by mouth daily before breakfast.     metFORMIN (GLUCOPHAGE) 500 MG tablet Take 1 tablet (500 mg total) by mouth 2 (two) times daily. (Patient taking differently: Take 1,000 mg by mouth 2 (two) times daily.)     metoprolol tartrate (LOPRESSOR) 50 MG tablet TAKE ONE TABLET BY MOUTH TWICE DAILY 180 tablet 1   mometasone (ELOCON) 0.1 % cream Apply 1 application topically daily as needed (psoriasis).      nitroGLYCERIN (NITROSTAT) 0.4 MG SL tablet Place 1 tablet (0.4 mg total) under the tongue every 5 (five) minutes as needed. (Patient taking differently: Place 0.4 mg under the tongue every 5 (five) minutes as needed for chest pain.) 25 tablet 2   pantoprazole (PROTONIX) 40 MG tablet Take 40 mg by mouth daily.      No current facility-administered medications for this visit.    Review of Systems: GENERAL: negative for malaise, night sweats HEENT: No changes in hearing or vision, no  nose bleeds or other nasal problems. NECK: Negative for lumps, goiter, pain and significant neck swelling RESPIRATORY: Negative for cough, wheezing CARDIOVASCULAR: Negative for chest pain, leg swelling, palpitations, orthopnea GI: SEE HPI MUSCULOSKELETAL: Negative for joint pain or swelling, back pain, and muscle pain. SKIN: Negative for lesions, rash PSYCH: Negative for sleep disturbance, mood disorder and recent psychosocial stressors. HEMATOLOGY Negative for prolonged bleeding, bruising easily, and swollen nodes. ENDOCRINE: Negative for cold or heat intolerance, polyuria, polydipsia and goiter. NEURO: negative for tremor, gait imbalance, syncope and seizures. The remainder of the review of systems is noncontributory.   Physical Exam: BP 135/85 (BP Location: Left Arm, Patient Position: Sitting, Cuff Size: Large)   Pulse 73   Temp 98 F (36.7 C) (Oral)   Ht 5\' 3"  (1.6 m)   Wt 194 lb (88 kg)   BMI 34.37 kg/m  GENERAL: The patient is AO x3, in no acute distress. HEENT: Head is normocephalic and atraumatic. EOMI are intact. Mouth is well hydrated and without lesions. NECK: Supple. No masses LUNGS: Clear to auscultation. No presence of rhonchi/wheezing/rales. Adequate chest expansion HEART: RRR, normal s1 and s2. ABDOMEN: Soft, nontender, no guarding, no peritoneal signs, and nondistended. BS +. No masses. EXTREMITIES: Without any cyanosis, clubbing, rash, lesions or edema. NEUROLOGIC: AOx3, no focal motor deficit. SKIN: no jaundice, no rashes  Imaging/Labs: as above  I personally reviewed and interpreted the available labs, imaging and endoscopic files.  Impression and Plan: Mary Strong is a 60 y.o. female with past medical history of anxiety, coronary artery disease status post stent placement, depression, hypertension, fatty liver, GERD, multiple sclerosis complicated by neurogenic bladder and neuropathy, restless leg syndrome and diabetes type 2, who presents for follow  up of iron deficiency anemia, abdominal pain and constipation.  The patient has presented episodes of abdominal bloating and discomfort since she started taking Trulicity, which have improved after she stopped these medications.  However she is having recurrent episodes of bloating and abdominal discomfort of unclear etiology.  It is possible that these episodes are related to a combination of bowel hypersensitivity/irritable bowel syndrome and neuropathy due to her multiple sclerosis.  We will need to evaluate this further with an EGD with small bowel biopsies, will also check celiac serology today.  This will also help to evaluate her iron deficiency anemia, will complement this evaluation  with a colonoscopy.  Finally, as she has not been able to tolerate adequately the oral iron, I will refer her to hematology for possible IV iron infusion.  - Schedule EGD and colonoscopy - Perform blood workup - Hematology referral - Explained presumed etiology of IBS symptoms. Patient was counseled about the benefit of implementing a low FODMAP to improve symptoms and recurrent episodes. A dietary list was provided to the patient. Also, the patient was counseled about the benefit of avoiding stressing situations and potential environmental triggers leading to symptomatology.   All questions were answered.      Harvel Quale, MD Gastroenterology and Hepatology [facility]

## 2020-10-10 NOTE — Progress Notes (Addendum)
Mary Strong, M.D. Gastroenterology & Hepatology [facility] [address] [place], [place] [address]  Primary Care Physician: Caryl Bis, MD [place] [phone]  I will communicate my assessment and recommendations to the referring MD via EMR.  Problems: IBS Iron deficiency anemia Medication induced constipation  History of Present Illness: Mary Strong is a 60 y.o. female with past medical history of anxiety, coronary artery disease status post stent placement, depression, hypertension, fatty liver, GERD, multiple sclerosis complicated by neurogenic bladder and neuropathy, restless leg syndrome and diabetes type 2, who presents for follow up of iron deficiency anemia, abdominal pain and constipation.  The patient was last seen on 05/24/2019. At that time, the patient was being evaluated for iron deficiency anemia but given recent placement of stent and being on DAPT, decision was made to hold on proceeding with with EGD and colonoscopy at that time.  She was also started on Linzess 145 mcg every day.  Patient reports that she was on Trulicity in the past and she reports that it caused constipation previously.  Since she noted this change, she stopped using the medication which led to an improvement in the frequency of her bowel movements. She reports she initially had some increase in her appetite. However, the patient has presented decreased appetite again recently.  In terms of her iron deficiency anemia, she reports that she tried taking the PO iron, but it caused some nausea, headache and abdominal cramping with abdominal fullness. Currently she is taking the oral iron every day, but will try to take every other day as she has not been able to tolerate adequately.  She denies having any episodes of melena or hematochezia.  She also endorses having some frequent bloating and abdominal pain in her upper abdomen  which she describes as an intermittent ache. She has only tried avoiding meat as it makes her bloating worse. Currently has a BM every day.   She is taking gabapentin for her MS only. Her MS is not considered to be active at the moment.  The patient denies having any nausea, vomiting, fever, chills, hematemesis, diarrhea, jaundice, pruritus or weight loss.  Most recent blood work-up from her PCP was performed on 09/18/2020 which showed an iron of 31, iron saturation 9% decreased, ferritin 12 decreased, vitamin W40 973 and folic acid of 14 normal, TIBC 347, CBC with hemoglobin of 10.5, MCV 78, platelets of 203, white blood cell count 5.6, CMP showing AST of 29, ALT of 23, alkaline phosphatase 88, total bilirubin 0.3, normal electrolytes and renal function with glucose of 241.  Last Colonoscopy: per patient last in 2001, normal per patient but no report available Last Endoscopy: per patient last in 2001, normal per patient but no report available  Fhx: grandmother pancreatic cancer, aunt stomach cancer, multiple cousins with colon cancer  Past Medical History: Past Medical History:  Diagnosis Date   Anxiety    Arthritis    CAD (coronary artery disease)    Heavily calcified vessels, DES to CTO RCA and DES to mid LAD September 2020   Constipation    Depression    Essential hypertension    Fatty liver    GERD (gastroesophageal reflux disease)    History of migraine    History of sleep apnea    Currently not using CPAP   Hypothyroidism    Multiple sclerosis (HCC)    Neurogenic bladder    Neuropathy  Palpitations    Restless leg syndrome    Skin cancer    Basal cell cancer on forehead, strong family hx of adenocarcinoma   Type 2 diabetes mellitus (HCC)     Past Surgical History: Past Surgical History:  Procedure Laterality Date   CESAREAN SECTION     x 2   CHOLECYSTECTOMY     CORONARY ATHERECTOMY N/A 01/18/2019   Procedure: CORONARY ATHERECTOMY;  Surgeon: Martinique, Peter M, MD;   Location: [place] [facility];  Service: Cardiovascular;  Laterality: N/A;   CORONARY CTO INTERVENTION N/A 01/18/2019   Procedure: CORONARY CTO INTERVENTION;  Surgeon: Martinique, Peter M, MD;  Location: [place] [facility];  Service: Cardiovascular;  Laterality: N/A;   CORONARY STENT INTERVENTION N/A 01/18/2019   Procedure: CORONARY STENT INTERVENTION;  Surgeon: Martinique, Peter M, MD;  Location: [place] [facility];  Service: Cardiovascular;  Laterality: N/A;   ENDOMETRIAL ABLATION     JOINT REPLACEMENT     LEFT HEART CATH AND CORONARY ANGIOGRAPHY N/A 01/10/2019   Procedure: LEFT HEART CATH AND CORONARY ANGIOGRAPHY;  Surgeon: Belva Crome, MD;  Location: [place] [facility];  Service: Cardiovascular;  Laterality: N/A;   TONSILLECTOMY     TOTAL HIP ARTHROPLASTY Right 12/04/2015   TOTAL HIP ARTHROPLASTY Right 12/04/2015   Procedure: TOTAL HIP ARTHROPLASTY ANTERIOR APPROACH;  Surgeon: Marybelle Killings, MD;  Location: [place];  Service: Orthopedics;  Laterality: Right;   TOTAL HIP ARTHROPLASTY Left 01/24/2016   Procedure: TOTAL HIP ARTHROPLASTY ANTERIOR APPROACH;  Surgeon: Marybelle Killings, MD;  Location: [place];  Service: Orthopedics;  Laterality: Left;  anterior approach   TUBAL LIGATION      Family History: Family History  Problem Relation Age of Onset   Multiple sclerosis Mother    Arthritis/Rheumatoid Mother    Psoriasis Father     Social History: Social History   Tobacco Use  Smoking Status Never  Smokeless Tobacco Never   Social History   Substance and Sexual Activity  Alcohol Use Yes   Alcohol/week: 0.0 standard drinks   Comment: "none since 2014"   Social History   Substance and Sexual Activity  Drug Use Not Currently   Comment: none in 6 minths    Allergies: Allergies  Allergen Reactions   Shellfish Allergy Anaphylaxis   Ciprofloxacin Hives   Copaxone [Glatiramer Acetate] Hives   Penicillins Hives and Rash    Bad headaches Did it involve swelling of the face/tongue/throat, SOB, or  low BP? No Did it involve sudden or severe rash/hives, skin peeling, or any reaction on the inside of your mouth or nose? No Did you need to seek medical attention at a hospital or doctor's office? No When did it last happen?      6 - 7 years If all above answers are "NO", may proceed with cephalosporin use.    Vancomycin Itching    Medications: Current Outpatient Medications  Medication Sig Dispense Refill   aspirin 81 MG tablet Take 81 mg by mouth daily.     BIOTIN PO Take 500 mcg by mouth daily.     calcium carbonate (TUMS - DOSED IN MG ELEMENTAL CALCIUM) 500 MG chewable tablet Chew 3 tablets by mouth daily as needed for indigestion or heartburn.     diazepam (VALIUM) 5 MG tablet TAKE ONE TABLET BY MOUTH THREE TIMES DAILY AS NEEDED 90 tablet 5   DULoxetine (CYMBALTA) 60 MG capsule Take 1 capsule (60 mg total) by mouth at bedtime. 30 capsule 11  gabapentin (NEURONTIN) 600 MG tablet Take 1 tablet (600 mg total) by mouth 3 (three) times daily. 90 tablet 5   levothyroxine (SYNTHROID) 112 MCG tablet Take 112 mcg by mouth daily before breakfast.     metFORMIN (GLUCOPHAGE) 500 MG tablet Take 1 tablet (500 mg total) by mouth 2 (two) times daily. (Patient taking differently: Take 1,000 mg by mouth 2 (two) times daily.)     metoprolol tartrate (LOPRESSOR) 50 MG tablet TAKE ONE TABLET BY MOUTH TWICE DAILY 180 tablet 1   mometasone (ELOCON) 0.1 % cream Apply 1 application topically daily as needed (psoriasis).      nitroGLYCERIN (NITROSTAT) 0.4 MG SL tablet Place 1 tablet (0.4 mg total) under the tongue every 5 (five) minutes as needed. (Patient taking differently: Place 0.4 mg under the tongue every 5 (five) minutes as needed for chest pain.) 25 tablet 2   pantoprazole (PROTONIX) 40 MG tablet Take 40 mg by mouth daily.      No current facility-administered medications for this visit.    Review of Systems: GENERAL: negative for malaise, night sweats HEENT: No changes in hearing or vision, no  nose bleeds or other nasal problems. NECK: Negative for lumps, goiter, pain and significant neck swelling RESPIRATORY: Negative for cough, wheezing CARDIOVASCULAR: Negative for chest pain, leg swelling, palpitations, orthopnea GI: SEE HPI MUSCULOSKELETAL: Negative for joint pain or swelling, back pain, and muscle pain. SKIN: Negative for lesions, rash PSYCH: Negative for sleep disturbance, mood disorder and recent psychosocial stressors. HEMATOLOGY Negative for prolonged bleeding, bruising easily, and swollen nodes. ENDOCRINE: Negative for cold or heat intolerance, polyuria, polydipsia and goiter. NEURO: negative for tremor, gait imbalance, syncope and seizures. The remainder of the review of systems is noncontributory.   Physical Exam: BP 135/85 (BP Location: Left Arm, Patient Position: Sitting, Cuff Size: Large)   Pulse 73   Temp 98 F (36.7 C) (Oral)   Ht 5\' 3"  (1.6 m)   Wt 194 lb (88 kg)   BMI 34.37 kg/m  GENERAL: The patient is AO x3, in no acute distress. HEENT: Head is normocephalic and atraumatic. EOMI are intact. Mouth is well hydrated and without lesions. NECK: Supple. No masses LUNGS: Clear to auscultation. No presence of rhonchi/wheezing/rales. Adequate chest expansion HEART: RRR, normal s1 and s2. ABDOMEN: Soft, nontender, no guarding, no peritoneal signs, and nondistended. BS +. No masses. EXTREMITIES: Without any cyanosis, clubbing, rash, lesions or edema. NEUROLOGIC: AOx3, no focal motor deficit. SKIN: no jaundice, no rashes  Imaging/Labs: as above  I personally reviewed and interpreted the available labs, imaging and endoscopic files.  Impression and Plan: Mary Strong is a 60 y.o. female with past medical history of anxiety, coronary artery disease status post stent placement, depression, hypertension, fatty liver, GERD, multiple sclerosis complicated by neurogenic bladder and neuropathy, restless leg syndrome and diabetes type 2, who presents for follow  up of iron deficiency anemia, abdominal pain and constipation.  The patient has presented episodes of abdominal bloating and discomfort since she started taking Trulicity, which have improved after she stopped these medications.  However she is having recurrent episodes of bloating and abdominal discomfort of unclear etiology.  It is possible that these episodes are related to a combination of bowel hypersensitivity/irritable bowel syndrome and neuropathy due to her multiple sclerosis.  We will need to evaluate this further with an EGD with small bowel biopsies, will also check celiac serology today.  This will also help to evaluate her iron deficiency anemia, will complement this evaluation  with a colonoscopy.  Finally, as she has not been able to tolerate adequately the oral iron, I will refer her to hematology for possible IV iron infusion.  - Schedule EGD and colonoscopy - Perform blood workup - Hematology referral - Explained presumed etiology of IBS symptoms. Patient was counseled about the benefit of implementing a low FODMAP to improve symptoms and recurrent episodes. A dietary list was provided to the patient. Also, the patient was counseled about the benefit of avoiding stressing situations and potential environmental triggers leading to symptomatology.   All questions were answered.      Harvel Quale, MD Gastroenterology and Hepatology [facility]

## 2020-10-10 NOTE — Patient Instructions (Signed)
Schedule EGD and colonoscopy

## 2020-10-11 ENCOUNTER — Encounter (INDEPENDENT_AMBULATORY_CARE_PROVIDER_SITE_OTHER): Payer: Self-pay

## 2020-10-11 LAB — CELIAC DISEASE PANEL
(tTG) Ab, IgA: 1 U/mL
(tTG) Ab, IgG: 2.1 U/mL
Gliadin IgA: 2.8 U/mL
Gliadin IgG: <1 U/mL
Immunoglobulin A: 318 mg/dL — ABNORMAL HIGH (ref 47–310)

## 2020-10-24 ENCOUNTER — Encounter (HOSPITAL_COMMUNITY): Payer: Self-pay | Admitting: Hematology and Oncology

## 2020-10-24 ENCOUNTER — Other Ambulatory Visit: Payer: Self-pay

## 2020-10-25 ENCOUNTER — Inpatient Hospital Stay (HOSPITAL_COMMUNITY): Payer: BC Managed Care – PPO | Attending: Hematology and Oncology | Admitting: Hematology and Oncology

## 2020-10-25 ENCOUNTER — Inpatient Hospital Stay (HOSPITAL_COMMUNITY): Payer: BC Managed Care – PPO

## 2020-10-25 ENCOUNTER — Encounter (HOSPITAL_COMMUNITY): Payer: Self-pay | Admitting: Hematology and Oncology

## 2020-10-25 VITALS — BP 139/76 | HR 78 | Temp 96.6°F | Resp 16 | Ht 63.0 in | Wt 195.7 lb

## 2020-10-25 DIAGNOSIS — D509 Iron deficiency anemia, unspecified: Secondary | ICD-10-CM

## 2020-10-25 DIAGNOSIS — G35 Multiple sclerosis: Secondary | ICD-10-CM | POA: Insufficient documentation

## 2020-10-25 DIAGNOSIS — Z79899 Other long term (current) drug therapy: Secondary | ICD-10-CM | POA: Insufficient documentation

## 2020-10-25 DIAGNOSIS — I10 Essential (primary) hypertension: Secondary | ICD-10-CM | POA: Diagnosis not present

## 2020-10-25 DIAGNOSIS — Z7982 Long term (current) use of aspirin: Secondary | ICD-10-CM | POA: Diagnosis not present

## 2020-10-25 DIAGNOSIS — G4730 Sleep apnea, unspecified: Secondary | ICD-10-CM | POA: Diagnosis not present

## 2020-10-25 DIAGNOSIS — E039 Hypothyroidism, unspecified: Secondary | ICD-10-CM | POA: Diagnosis not present

## 2020-10-25 DIAGNOSIS — Z7951 Long term (current) use of inhaled steroids: Secondary | ICD-10-CM | POA: Diagnosis not present

## 2020-10-25 DIAGNOSIS — E119 Type 2 diabetes mellitus without complications: Secondary | ICD-10-CM | POA: Diagnosis not present

## 2020-10-25 DIAGNOSIS — G2581 Restless legs syndrome: Secondary | ICD-10-CM | POA: Insufficient documentation

## 2020-10-25 DIAGNOSIS — I2510 Atherosclerotic heart disease of native coronary artery without angina pectoris: Secondary | ICD-10-CM | POA: Insufficient documentation

## 2020-10-25 DIAGNOSIS — Z7984 Long term (current) use of oral hypoglycemic drugs: Secondary | ICD-10-CM | POA: Insufficient documentation

## 2020-10-25 LAB — CBC WITH DIFFERENTIAL/PLATELET
Abs Immature Granulocytes: 0.02 10*3/uL (ref 0.00–0.07)
Basophils Absolute: 0 10*3/uL (ref 0.0–0.1)
Basophils Relative: 0 %
Eosinophils Absolute: 0.1 10*3/uL (ref 0.0–0.5)
Eosinophils Relative: 2 %
HCT: 38.1 % (ref 36.0–46.0)
Hemoglobin: 11.2 g/dL — ABNORMAL LOW (ref 12.0–15.0)
Immature Granulocytes: 0 %
Lymphocytes Relative: 24 %
Lymphs Abs: 1.8 10*3/uL (ref 0.7–4.0)
MCH: 23 pg — ABNORMAL LOW (ref 26.0–34.0)
MCHC: 29.4 g/dL — ABNORMAL LOW (ref 30.0–36.0)
MCV: 78.2 fL — ABNORMAL LOW (ref 80.0–100.0)
Monocytes Absolute: 0.5 10*3/uL (ref 0.1–1.0)
Monocytes Relative: 7 %
Neutro Abs: 5.1 10*3/uL (ref 1.7–7.7)
Neutrophils Relative %: 67 %
Platelets: 249 10*3/uL (ref 150–400)
RBC: 4.87 MIL/uL (ref 3.87–5.11)
RDW: 17.2 % — ABNORMAL HIGH (ref 11.5–15.5)
WBC: 7.6 10*3/uL (ref 4.0–10.5)
nRBC: 0 % (ref 0.0–0.2)

## 2020-10-25 LAB — FERRITIN: Ferritin: 8 ng/mL — ABNORMAL LOW (ref 11–307)

## 2020-10-25 LAB — IRON AND TIBC
Iron: 29 ug/dL (ref 28–170)
Saturation Ratios: 6 % — ABNORMAL LOW (ref 10.4–31.8)
TIBC: 451 ug/dL — ABNORMAL HIGH (ref 250–450)
UIBC: 422 ug/dL

## 2020-10-25 NOTE — Progress Notes (Signed)
[facility] NOTE  Patient Care Team: Caryl Bis, MD as PCP - General (Family Medicine) Satira Sark, MD as PCP - Cardiology (Cardiology)  CHIEF COMPLAINTS/PURPOSE OF CONSULTATION:  IDA  ASSESSMENT & PLAN:   This is a very pleasant 60 yr old female patient with IDA referred to hematology for evaluation and recommendations. She has endoscopy and colonoscopy scheduled for July 5 th 2022. She denies any blood loss, but wonders if chronic use of PPI contributed to malabsorption. I have recommended IV iron since she cannot tolerate oral iron. We have discussed about IV injectafer, discussed adverse effects of iron including very rare risk of anaphylaxis, hypophosphatemia. She is agreeable to proceed with IV iron infusion. She should also proceed with endoscopy and colonoscopy and RTC in 3 months for repeat labs She was encouraged to reach out to [place] with any abnormal findings on colonoscopy.  HISTORY OF PRESENTING ILLNESS:   NISSI DOFFING 60 y.o. female is here because of IDA  This is a very pleasant 60 yr old female patient with iron deficiency referred to hematology for the above. She arrived to the appointment by herself. She is a retired Marine scientist, has [place] for which she is not on any medication currently. She has had iron deficiency intermittently, cannot tolerate oral iron pills.  She denies any known malabsorption problems but she has acid reflux for a very long time and she had to stay on PPI for a very long time. She denies any known family history of IDA Today she complains of fatigue, poor appetite, intermittent constipation, numbness and burning lower extremities from MS, sleep problems. Rest of the pertinent 10 point ROS reviewed and negative.  REVIEW OF SYSTEMS:   Constitutional: Denies fevers, chills or abnormal night sweats Eyes: Denies blurriness of vision, double vision or watery eyes Ears, nose, mouth, throat, and face: Denies mucositis or  sore throat Respiratory: Denies cough, dyspnea or wheezes Cardiovascular: Denies palpitation, chest discomfort or lower extremity swelling Gastrointestinal:  Denies nausea, heartburn or change in bowel habits Skin: Denies abnormal skin rashes Lymphatics: Denies new lymphadenopathy or easy bruising Neurological:Denies numbness, tingling or new weaknesses Behavioral/Psych: Mood is stable, no new changes  All other systems were reviewed with the patient and are negative.  MEDICAL HISTORY:  Past Medical History:  Diagnosis Date   Anxiety    Arthritis    CAD (coronary artery disease)    Heavily calcified vessels, DES to CTO RCA and DES to mid LAD September 2020   Constipation    Depression    Essential hypertension    Fatty liver    GERD (gastroesophageal reflux disease)    History of migraine    History of sleep apnea    Currently not using CPAP   Hypothyroidism    Multiple sclerosis (HCC)    Neurogenic bladder    Neuropathy    Palpitations    Restless leg syndrome    Skin cancer    Basal cell cancer on forehead, strong family hx of adenocarcinoma   Type 2 diabetes mellitus ([place])     SURGICAL HISTORY: Past Surgical History:  Procedure Laterality Date   CESAREAN SECTION     x 2   CHOLECYSTECTOMY     CORONARY ATHERECTOMY N/A 01/18/2019   Procedure: CORONARY ATHERECTOMY;  Surgeon: Martinique, Peter M, MD;  Location: [facility];  Service: Cardiovascular;  Laterality: N/A;   CORONARY CTO INTERVENTION N/A 01/18/2019   Procedure: CORONARY CTO INTERVENTION;  Surgeon:  Martinique, Peter M, MD;  Location: [facility];  Service: Cardiovascular;  Laterality: N/A;   CORONARY STENT INTERVENTION N/A 01/18/2019   Procedure: CORONARY STENT INTERVENTION;  Surgeon: Martinique, Peter M, MD;  Location: [facility];  Service: Cardiovascular;  Laterality: N/A;   ENDOMETRIAL ABLATION     JOINT REPLACEMENT     LEFT HEART CATH AND CORONARY ANGIOGRAPHY N/A 01/10/2019   Procedure: LEFT HEART  CATH AND CORONARY ANGIOGRAPHY;  Surgeon: Belva Crome, MD;  Location: [facility];  Service: Cardiovascular;  Laterality: N/A;   TONSILLECTOMY     TOTAL HIP ARTHROPLASTY Right 12/04/2015   TOTAL HIP ARTHROPLASTY Right 12/04/2015   Procedure: TOTAL HIP ARTHROPLASTY ANTERIOR APPROACH;  Surgeon: Marybelle Killings, MD;  Location: [facility];  Service: Orthopedics;  Laterality: Right;   TOTAL HIP ARTHROPLASTY Left 01/24/2016   Procedure: TOTAL HIP ARTHROPLASTY ANTERIOR APPROACH;  Surgeon: Marybelle Killings, MD;  Location: [facility];  Service: Orthopedics;  Laterality: Left;  anterior approach   TUBAL LIGATION      SOCIAL HISTORY: Social History   Socioeconomic History   Marital status: Married    Spouse name: Not on file   Number of children: Not on file   Years of education: Not on file   Highest education level: Not on file  Occupational History   Not on file  Tobacco Use   Smoking status: Never   Smokeless tobacco: Never  Vaping Use   Vaping Use: Never used  Substance and Sexual Activity   Alcohol use: Yes    Alcohol/week: 0.0 standard drinks    Comment: "none since 2014"   Drug use: Not Currently    Comment: none in 6 minths   Sexual activity: Not Currently  Other Topics Concern   Not on file  Social History Narrative   Not on file   Social Determinants of Health   Financial Resource Strain: Low Risk    Difficulty of Paying Living Expenses: Not hard at all  Food Insecurity: No Food Insecurity   Worried About Charity fundraiser in the Last Year: Never true   [place] in the Last Year: Never true  Transportation Needs: No Transportation Needs   Lack of Transportation (Medical): No   Lack of Transportation (Non-Medical): No  Physical Activity: Inactive   Days of Exercise per Week: 0 days   Minutes of Exercise per Session: 0 min  Stress: No Stress Concern Present   Feeling of Stress : Only a little  Social Connections: Engineer, building services of Communication with  Friends and Family: More than three times a week   Frequency of Social Gatherings with Friends and Family: More than three times a week   Attends Religious Services: More than 4 times per year   Active Member of [employer] or Organizations: Yes   Attends Music therapist: More than 4 times per year   Marital Status: Married  Human resources officer Violence: Not At Risk   Fear of Current or Ex-Partner: No   Emotionally Abused: No   Physically Abused: No   Sexually Abused: No    FAMILY HISTORY: Family History  Problem Relation Age of Onset   Multiple sclerosis Mother    Arthritis/Rheumatoid Mother    Psoriasis Father    Atrial fibrillation Father    Hypertension Brother     ALLERGIES:  is allergic to shellfish allergy, ciprofloxacin, copaxone [glatiramer acetate], penicillins, and vancomycin.  MEDICATIONS:  Current  Outpatient Medications  Medication Sig Dispense Refill   aspirin 81 MG tablet Take 81 mg by mouth daily.     BIOTIN PO Take 500 mcg by mouth daily.     calcium carbonate (TUMS - DOSED IN MG ELEMENTAL CALCIUM) 500 MG chewable tablet Chew 3 tablets by mouth daily as needed for indigestion or heartburn.     diazepam (VALIUM) 5 MG tablet TAKE ONE TABLET BY MOUTH THREE TIMES DAILY AS NEEDED 90 tablet 5   DULoxetine (CYMBALTA) 60 MG capsule Take 1 capsule (60 mg total) by mouth at bedtime. 30 capsule 11   furosemide (LASIX) 40 MG tablet      gabapentin (NEURONTIN) 600 MG tablet Take 1 tablet (600 mg total) by mouth 3 (three) times daily. 90 tablet 5   levothyroxine (SYNTHROID) 112 MCG tablet Take 112 mcg by mouth daily before breakfast.     metFORMIN (GLUCOPHAGE) 500 MG tablet Take 1 tablet (500 mg total) by mouth 2 (two) times daily. (Patient taking differently: Take 1,000 mg by mouth 2 (two) times daily.)     metoprolol tartrate (LOPRESSOR) 50 MG tablet TAKE ONE TABLET BY MOUTH TWICE DAILY 180 tablet 1   mometasone (ELOCON) 0.1 % cream Apply 1 application topically daily  as needed (psoriasis).      nitroGLYCERIN (NITROSTAT) 0.4 MG SL tablet Place 1 tablet (0.4 mg total) under the tongue every 5 (five) minutes as needed. (Patient taking differently: Place 0.4 mg under the tongue every 5 (five) minutes as needed for chest pain.) 25 tablet 2   pantoprazole (PROTONIX) 40 MG tablet Take 40 mg by mouth daily.      rosuvastatin (CRESTOR) 5 MG tablet      No current facility-administered medications for this visit.   PHYSICAL EXAMINATION: ECOG PERFORMANCE STATUS: 0 - Asymptomatic  Vitals:   10/25/20 1331  BP: 139/76  Pulse: 78  Resp: 16  Temp: (!) 96.6 F (35.9 C)  SpO2: 100%   Filed Weights   10/25/20 1331  Weight: 195 lb 11.2 oz (88.8 kg)    GENERAL:alert, no distress and comfortable SKIN: skin color, texture, turgor are normal, no rashes or significant lesions EYES: normal, conjunctiva are pink and non-injected, sclera clear OROPHARYNX:no exudate, no erythema and lips, buccal mucosa, and tongue normal  NECK: supple, thyroid normal size, non-tender, without nodularity LYMPH:  no palpable lymphadenopathy in the cervical, axillary or inguinal LUNGS: clear to auscultation and percussion with normal breathing effort HEART: regular rate & rhythm and no murmurs and no lower extremity edema ABDOMEN:abdomen soft, non-tender and normal bowel sounds Musculoskeletal:no cyanosis of digits and no clubbing  PSYCH: alert & oriented x 3 with fluent speech NEURO: no focal motor/sensory deficits  LABORATORY DATA:  I have reviewed the data as listed Lab Results  Component Value Date   WBC 7.7 05/24/2019   HGB 10.9 (L) 08/17/2019   HCT 35.0 08/17/2019   MCV 72.1 (L) 05/24/2019   PLT 282 05/24/2019     Chemistry      Component Value Date/Time   NA 140 02/27/2019 1428   K 4.5 02/27/2019 1428   CL 106 02/27/2019 1428   CO2 26 02/27/2019 1428   BUN 12 02/27/2019 1428   CREATININE 1.02 02/27/2019 1428      Component Value Date/Time   CALCIUM 9.3 02/27/2019  1428   ALKPHOS 70 01/17/2016 1500   AST 19 02/27/2019 1428   ALT 13 02/27/2019 1428   BILITOT 0.4 02/27/2019 1428   BILITOT 0.3 02/12/2015  [place]: I have personally reviewed the radiological images as listed and agreed with the findings in the report. No results found.  All questions were answered. The patient knows to call the clinic with any problems, questions or concerns. I spent 45 minutes in the care of this patient including H and P, review of records, counseling and coordination of care.     Benay Pike, MD 10/25/2020 1:43 PM

## 2020-10-28 ENCOUNTER — Encounter (HOSPITAL_COMMUNITY): Payer: Self-pay | Admitting: Hematology and Oncology

## 2020-11-01 ENCOUNTER — Other Ambulatory Visit: Payer: Self-pay

## 2020-11-01 ENCOUNTER — Inpatient Hospital Stay (HOSPITAL_COMMUNITY): Payer: BC Managed Care – PPO | Attending: Hematology and Oncology

## 2020-11-01 ENCOUNTER — Encounter (HOSPITAL_COMMUNITY): Payer: Self-pay

## 2020-11-01 VITALS — BP 124/69 | HR 73 | Temp 97.0°F | Resp 18

## 2020-11-01 DIAGNOSIS — D509 Iron deficiency anemia, unspecified: Secondary | ICD-10-CM

## 2020-11-01 MED ORDER — FAMOTIDINE 20 MG IN NS 100 ML IVPB
20.0000 mg | Freq: Once | INTRAVENOUS | Status: AC
  Administered 2020-11-01: 20 mg via INTRAVENOUS
  Filled 2020-11-01: qty 100

## 2020-11-01 MED ORDER — SODIUM CHLORIDE 0.9 % IV SOLN: Freq: Once | INTRAVENOUS | Status: AC

## 2020-11-01 MED ORDER — ACETAMINOPHEN 325 MG PO TABS
650.0000 mg | ORAL_TABLET | Freq: Once | ORAL | Status: AC
  Administered 2020-11-01: 650 mg via ORAL
  Filled 2020-11-01: qty 2

## 2020-11-01 MED ORDER — SODIUM CHLORIDE 0.9 % IV SOLN
750.0000 mg | Freq: Once | INTRAVENOUS | Status: AC
  Administered 2020-11-01: 750 mg via INTRAVENOUS
  Filled 2020-11-01: qty 15

## 2020-11-01 NOTE — Patient Instructions (Signed)
[facility]  Discharge Instructions: Thank you for choosing [facility] to provide your oncology and hematology care.  If you have a lab appointment with the Cancer Center, please come in thru the Main Entrance and check in at the main information desk.  Wear comfortable clothing and clothing appropriate for easy access to any Portacath or PICC line.   We strive to give you quality time with your provider. You may need to reschedule your appointment if you arrive late (15 or more minutes).  Arriving late affects you and other patients whose appointments are after yours.  Also, if you miss three or more appointments without notifying the office, you may be dismissed from the clinic at the provider's discretion.      For prescription refill requests, have your pharmacy contact our office and allow 72 hours for refills to be completed.        To help prevent nausea and vomiting after your treatment, we encourage you to take your nausea medication as directed.  BELOW ARE SYMPTOMS THAT SHOULD BE REPORTED IMMEDIATELY: *FEVER GREATER THAN 100.4 F (38 C) OR HIGHER *CHILLS OR SWEATING *NAUSEA AND VOMITING THAT IS NOT CONTROLLED WITH YOUR NAUSEA MEDICATION *UNUSUAL SHORTNESS OF BREATH *UNUSUAL BRUISING OR BLEEDING *URINARY PROBLEMS (pain or burning when urinating, or frequent urination) *BOWEL PROBLEMS (unusual diarrhea, constipation, pain near the anus) TENDERNESS IN MOUTH AND THROAT WITH OR WITHOUT PRESENCE OF ULCERS (sore throat, sores in mouth, or a toothache) UNUSUAL RASH, SWELLING OR PAIN  UNUSUAL VAGINAL DISCHARGE OR ITCHING   Items with * indicate a potential emergency and should be followed up as soon as possible or go to the Emergency Department if any problems should occur.  Please show the CHEMOTHERAPY ALERT CARD or IMMUNOTHERAPY ALERT CARD at check-in to the Emergency Department and triage nurse.  Should you have questions after your visit or need to cancel  or reschedule your appointment, please contact [facility] [phone]  and follow the prompts.  Office hours are 8:00 a.m. to 4:30 p.m. Monday - Friday. Please note that voicemails left after 4:00 p.m. may not be returned until the following business day.  We are closed weekends and major holidays. You have access to a nurse at all times for urgent questions. Please call the main number to the clinic [phone] and follow the prompts.  For any non-urgent questions, you may also contact your provider using MyChart. We now offer e-Visits for anyone 18 and older to request care online for non-urgent symptoms. For details visit [email or web address].   Also download the MyChart app! Go to the app store, search "MyChart", open the app, select [facility], and log in with your MyChart username and password.  Due to Covid, a mask is required upon entering the hospital/clinic. If you do not have a mask, one will be given to you upon arrival. For doctor visits, patients may have 1 support person aged 18 or older with them. For treatment visits, patients cannot have anyone with them due to current Covid guidelines and our immunocompromised population.  

## 2020-11-01 NOTE — Progress Notes (Signed)
Patient presents today for treatment per orders.  Patient tolerated treatment well with no complaints voiced.  Patient left ambulatory in stable condition.  Vital signs stable at discharge.  Follow up as scheduled.    

## 2020-11-05 ENCOUNTER — Ambulatory Visit (HOSPITAL_COMMUNITY)
Admission: RE | Admit: 2020-11-05 | Discharge: 2020-11-05 | Disposition: A | Discharge status: Home / Self Care | Payer: BC Managed Care – PPO | Attending: Gastroenterology | Admitting: Gastroenterology

## 2020-11-05 ENCOUNTER — Encounter (HOSPITAL_COMMUNITY): Payer: Self-pay | Admitting: Gastroenterology

## 2020-11-05 ENCOUNTER — Ambulatory Visit (HOSPITAL_COMMUNITY): Payer: BC Managed Care – PPO | Admitting: Anesthesiology

## 2020-11-05 ENCOUNTER — Other Ambulatory Visit: Payer: Self-pay

## 2020-11-05 ENCOUNTER — Telehealth (INDEPENDENT_AMBULATORY_CARE_PROVIDER_SITE_OTHER): Payer: Self-pay | Admitting: *Deleted

## 2020-11-05 ENCOUNTER — Encounter (HOSPITAL_COMMUNITY)
Admission: RE | Disposition: A | Discharge status: Home / Self Care | Payer: Self-pay | Source: Home / Self Care | Attending: Gastroenterology

## 2020-11-05 DIAGNOSIS — K317 Polyp of stomach and duodenum: Secondary | ICD-10-CM | POA: Insufficient documentation

## 2020-11-05 DIAGNOSIS — K5903 Drug induced constipation: Secondary | ICD-10-CM | POA: Insufficient documentation

## 2020-11-05 DIAGNOSIS — T50905A Adverse effect of unspecified drugs, medicaments and biological substances, initial encounter: Secondary | ICD-10-CM | POA: Diagnosis not present

## 2020-11-05 DIAGNOSIS — K581 Irritable bowel syndrome with constipation: Secondary | ICD-10-CM | POA: Diagnosis not present

## 2020-11-05 DIAGNOSIS — Z7984 Long term (current) use of oral hypoglycemic drugs: Secondary | ICD-10-CM | POA: Insufficient documentation

## 2020-11-05 DIAGNOSIS — I25119 Atherosclerotic heart disease of native coronary artery with unspecified angina pectoris: Secondary | ICD-10-CM | POA: Diagnosis not present

## 2020-11-05 DIAGNOSIS — Z91013 Allergy to seafood: Secondary | ICD-10-CM | POA: Insufficient documentation

## 2020-11-05 DIAGNOSIS — Z955 Presence of coronary angioplasty implant and graft: Secondary | ICD-10-CM | POA: Insufficient documentation

## 2020-11-05 DIAGNOSIS — D122 Benign neoplasm of ascending colon: Secondary | ICD-10-CM | POA: Diagnosis not present

## 2020-11-05 DIAGNOSIS — I2510 Atherosclerotic heart disease of native coronary artery without angina pectoris: Secondary | ICD-10-CM | POA: Insufficient documentation

## 2020-11-05 DIAGNOSIS — G35 Multiple sclerosis: Secondary | ICD-10-CM | POA: Diagnosis not present

## 2020-11-05 DIAGNOSIS — Z85828 Personal history of other malignant neoplasm of skin: Secondary | ICD-10-CM | POA: Insufficient documentation

## 2020-11-05 DIAGNOSIS — K219 Gastro-esophageal reflux disease without esophagitis: Secondary | ICD-10-CM | POA: Diagnosis not present

## 2020-11-05 DIAGNOSIS — F419 Anxiety disorder, unspecified: Secondary | ICD-10-CM | POA: Insufficient documentation

## 2020-11-05 DIAGNOSIS — I10 Essential (primary) hypertension: Secondary | ICD-10-CM | POA: Diagnosis not present

## 2020-11-05 DIAGNOSIS — D509 Iron deficiency anemia, unspecified: Secondary | ICD-10-CM | POA: Insufficient documentation

## 2020-11-05 DIAGNOSIS — Z7982 Long term (current) use of aspirin: Secondary | ICD-10-CM | POA: Insufficient documentation

## 2020-11-05 DIAGNOSIS — K2289 Other specified disease of esophagus: Secondary | ICD-10-CM

## 2020-11-05 DIAGNOSIS — Z881 Allergy status to other antibiotic agents status: Secondary | ICD-10-CM | POA: Insufficient documentation

## 2020-11-05 DIAGNOSIS — Z79890 Hormone replacement therapy (postmenopausal): Secondary | ICD-10-CM | POA: Insufficient documentation

## 2020-11-05 DIAGNOSIS — K621 Rectal polyp: Secondary | ICD-10-CM | POA: Diagnosis not present

## 2020-11-05 DIAGNOSIS — K648 Other hemorrhoids: Secondary | ICD-10-CM | POA: Insufficient documentation

## 2020-11-05 DIAGNOSIS — K649 Unspecified hemorrhoids: Secondary | ICD-10-CM | POA: Insufficient documentation

## 2020-11-05 DIAGNOSIS — E119 Type 2 diabetes mellitus without complications: Secondary | ICD-10-CM | POA: Insufficient documentation

## 2020-11-05 DIAGNOSIS — D123 Benign neoplasm of transverse colon: Secondary | ICD-10-CM | POA: Insufficient documentation

## 2020-11-05 DIAGNOSIS — F32A Depression, unspecified: Secondary | ICD-10-CM | POA: Diagnosis not present

## 2020-11-05 DIAGNOSIS — G629 Polyneuropathy, unspecified: Secondary | ICD-10-CM | POA: Insufficient documentation

## 2020-11-05 DIAGNOSIS — Z96643 Presence of artificial hip joint, bilateral: Secondary | ICD-10-CM | POA: Diagnosis not present

## 2020-11-05 DIAGNOSIS — Z880 Allergy status to penicillin: Secondary | ICD-10-CM | POA: Insufficient documentation

## 2020-11-05 DIAGNOSIS — Z888 Allergy status to other drugs, medicaments and biological substances status: Secondary | ICD-10-CM | POA: Insufficient documentation

## 2020-11-05 DIAGNOSIS — Z79899 Other long term (current) drug therapy: Secondary | ICD-10-CM | POA: Diagnosis not present

## 2020-11-05 HISTORY — PX: POLYPECTOMY: SHX5525

## 2020-11-05 HISTORY — PX: COLONOSCOPY WITH PROPOFOL: SHX5780

## 2020-11-05 HISTORY — PX: BIOPSY: SHX5522

## 2020-11-05 HISTORY — PX: ESOPHAGOGASTRODUODENOSCOPY (EGD) WITH PROPOFOL: SHX5813

## 2020-11-05 LAB — HM COLONOSCOPY

## 2020-11-05 LAB — GLUCOSE, CAPILLARY: Glucose-Capillary: 170 mg/dL — ABNORMAL HIGH (ref 70–99)

## 2020-11-05 SURGERY — COLONOSCOPY WITH PROPOFOL
Anesthesia: General

## 2020-11-05 MED ORDER — LACTATED RINGERS IV SOLN: INTRAVENOUS | Status: DC

## 2020-11-05 MED ORDER — PROPOFOL 500 MG/50ML IV EMUL
INTRAVENOUS | Status: DC | PRN
  Administered 2020-11-05: 150 ug/kg/min via INTRAVENOUS

## 2020-11-05 MED ORDER — DICYCLOMINE HCL 10 MG PO CAPS: 10.0000 mg | ORAL_CAPSULE | Freq: Two times a day (BID) | ORAL | 5 refills | Status: DC | PRN

## 2020-11-05 MED ORDER — LIDOCAINE HCL (CARDIAC) PF 100 MG/5ML IV SOSY
PREFILLED_SYRINGE | INTRAVENOUS | Status: DC | PRN
  Administered 2020-11-05: 50 mg via INTRAVENOUS

## 2020-11-05 MED ORDER — PROPOFOL 10 MG/ML IV BOLUS
INTRAVENOUS | Status: DC | PRN
  Administered 2020-11-05: 100 mg via INTRAVENOUS
  Administered 2020-11-05: 50 mg via INTRAVENOUS
  Administered 2020-11-05: 20 mg via INTRAVENOUS

## 2020-11-05 NOTE — Anesthesia Procedure Notes (Signed)
Date/Time: 11/05/2020 11:46 AM Performed by: Orlie Dakin, CRNA Pre-anesthesia Checklist: Patient identified, Emergency Drugs available, Suction available and Patient being monitored Patient Re-evaluated:Patient Re-evaluated prior to induction Oxygen Delivery Method: Nasal cannula Induction Type: IV induction Placement Confirmation: positive ETCO2

## 2020-11-05 NOTE — Interval H&P Note (Signed)
History and Physical Interval Note:  11/05/2020 10:46 AM Mary Strong is a 60 y.o. female with past medical history of anxiety, coronary artery disease status post stent placement, depression, hypertension, fatty liver, GERD, multiple sclerosis complicated by neurogenic bladder and neuropathy, restless leg syndrome and diabetes type 2, who presents for evaluation of iron deficiency anemia, abdominal pain and constipation.  Patient reports that she is still having significant abdominal pain in the left upper quadrant.  Denies any nausea, vomiting, fever, chills, melena or hematochezia.  She could not tolerate her oral iron was switched to IV iron.  Most recent labs from 10/25/2020 showed a hemoglobin of 11.2 with MCV of 78, iron of 29, TIBC 451, iron saturation 6% and ferritin 8  BP 131/60   Temp 98.2 F (36.8 C) (Oral)   Resp 16   Ht 5\' 3"  (1.6 m)   Wt 88 kg   SpO2 96%   BMI 34.37 kg/m   GENERAL: The patient is AO x3, in no acute distress. HEENT: Head is normocephalic and atraumatic. EOMI are intact. Mouth is well hydrated and without lesions. NECK: Supple. No masses LUNGS: Clear to auscultation. No presence of rhonchi/wheezing/rales. Adequate chest expansion HEART: RRR, normal s1 and s2. ABDOMEN: Soft, nontender, no guarding, no peritoneal signs, and nondistended. BS +. No masses. EXTREMITIES: Without any cyanosis, clubbing, rash, lesions or edema. NEUROLOGIC: AOx3, no focal motor deficit. SKIN: no jaundice, no rashes    [name]  has presented today for surgery, with the diagnosis of IDA.  The various methods of treatment have been discussed with the patient and family. After consideration of risks, benefits and other options for treatment, the patient has consented to  Procedure(s) with comments: COLONOSCOPY WITH PROPOFOL (N/A) - 12:30 ESOPHAGOGASTRODUODENOSCOPY (EGD) WITH PROPOFOL (N/A) as a surgical intervention.  The patient's history has been reviewed, patient  examined, no change in status, stable for surgery.  I have reviewed the patient's chart and labs.  Questions were answered to the patient's satisfaction.     [name]

## 2020-11-05 NOTE — Op Note (Signed)
[facility] Patient Name: Mary Strong Procedure Date: 11/05/2020 11:55 AM MRN: [id number] Date of Birth: February 02, 1961 Attending MD: Maylon Peppers ,  CSN: [id number] Age: 60 Admit Type: Outpatient Procedure:                Colonoscopy Indications:              Iron deficiency anemia Providers:                Maylon Peppers, Lambert Mody, Kristine L.                            Risa Grill, Technician Referring MD:              Medicines:                Monitored Anesthesia Care Complications:            No immediate complications. Estimated Blood Loss:     Estimated blood loss: none. Procedure:                Pre-Anesthesia Assessment:                           - Prior to the procedure, a History and Physical                            was performed, and patient medications, allergies                            and sensitivities were reviewed. The patient's                            tolerance of previous anesthesia was reviewed.                           - The risks and benefits of the procedure and the                            sedation options and risks were discussed with the                            patient. All questions were answered and informed                            consent was obtained.                           - ASA Grade Assessment: II - A patient with mild                            systemic disease.                           After obtaining informed consent, the colonoscope                            was passed under direct vision. Throughout the  procedure, the patient's blood pressure, pulse, and                            oxygen saturations were monitored continuously. The                            [id number] ([id number]) was introduced through the                            anus and advanced to the the terminal ileum. The                            colonoscopy was performed without difficulty. The                             patient tolerated the procedure well. The quality                            of the bowel preparation was good. Scope In: 11:59:41 AM Scope Out: 12:27:59 PM Scope Withdrawal Time: 0 hours 19 minutes 14 seconds  Total Procedure Duration: 0 hours 28 minutes 18 seconds  Findings:      Hemorrhoids were found on perianal exam.      The terminal ileum appeared normal.      Four sessile polyps were found in the transverse colon and ascending       colon. The polyps were 3 to 8 mm in size. These polyps were removed with       a cold snare. Resection and retrieval were complete.      A 5 mm polyp was found in the distal rectum, just above the dentate       line. The polyp was sessile. The polyp was removed with a hot snare as       it was stiff at the base when I attempted to cut the polyp with a cold       snare. Resection and retrieval were complete.      Non-bleeding internal hemorrhoids were found during retroflexion. The       hemorrhoids were small. Impression:               - Hemorrhoids found on perianal exam.                           - The examined portion of the ileum was normal.                           - Four 3 to 8 mm polyps in the transverse colon and                            in the ascending colon, removed with a cold snare.                            Resected and retrieved.                           - One 5 mm polyp in the distal  rectum, removed with                            a hot snare. Resected and retrieved.                           - Non-bleeding internal hemorrhoids. Moderate Sedation:      Per Anesthesia Care Recommendation:           - Discharge patient to home (ambulatory).                           - Resume previous diet.                           - Await pathology results.                           - Repeat colonoscopy for surveillance based on                            pathology results.                           - Schedule capsule endoscopy Procedure  Code(s):        --- Professional ---                           [phone], Colonoscopy, flexible; with removal of                            tumor(s), polyp(s), or other lesion(s) by snare                            technique Diagnosis Code(s):        --- Professional ---                           K64.8, Other hemorrhoids                           K63.5, Polyp of colon                           K62.1, Rectal polyp                           D50.9, Iron deficiency anemia, unspecified CPT copyright 2019 American Medical Association. All rights reserved. The codes documented in this report are preliminary and upon coder review may  be revised to meet current compliance requirements. Maylon Peppers, MD Maylon Peppers,  11/05/2020 12:43:26 PM This report has been signed electronically. Number of Addenda: 0

## 2020-11-05 NOTE — Op Note (Signed)
[facility] Patient Name: Mary Strong Procedure Date: 11/05/2020 11:24 AM MRN: [id number] Date of Birth: [birth year] Attending MD: Maylon Peppers ,  CSN: [id number] Age: 60 Admit Type: Outpatient Procedure:                Upper GI endoscopy Indications:              Iron deficiency anemia Providers:                Maylon Peppers, Lambert Mody, Kristine L.                            Risa Grill, Technician Referring MD:              Medicines:                Monitored Anesthesia Care Complications:            No immediate complications. Estimated Blood Loss:     Estimated blood loss: none. Procedure:                Pre-Anesthesia Assessment:                           - Prior to the procedure, a History and Physical                            was performed, and patient medications, allergies                            and sensitivities were reviewed. The patient's                            tolerance of previous anesthesia was reviewed.                           - The risks and benefits of the procedure and the                            sedation options and risks were discussed with the                            patient. All questions were answered and informed                            consent was obtained.                           - ASA Grade Assessment: II - A patient with mild                            systemic disease.                           After obtaining informed consent, the endoscope was                            passed under direct vision. Throughout the  procedure, the patient's blood pressure, pulse, and                            oxygen saturations were monitored continuously. The                            GIF-H190 ([id number]) scope was introduced through the                            mouth, and advanced to the second part of duodenum.                            The upper GI endoscopy was accomplished without                             difficulty. The patient tolerated the procedure                            well. Scope In: 11:40:47 AM Scope Out: 11:52:56 AM Total Procedure Duration: 0 hours 12 minutes 9 seconds  Findings:      The Z-line was irregular and was found 39 cm from the incisors.      The examined esophagus was normal.      Multiple 2 to 12 mm sessile polyps with no bleeding and no stigmata of       recent bleeding were found in the gastric fundus and in the gastric       body. One polyp was removed with a hot snare. One polyp was removed with       a cold snare. Resection and retrieval were complete with the use of a       Roth net.      The examined duodenum was normal. Biopsies were taken with a cold       forceps for histology. Impression:               - Z-line irregular, 39 cm from the incisors.                           - Normal esophagus.                           - Multiple gastric polyps. Resected and retrieved.                           - Normal examined duodenum. Biopsied. Moderate Sedation:      Per Anesthesia Care Recommendation:           - Discharge patient to home (ambulatory).                           - Resume previous diet.                           - Await pathology results. .                           - Continue IV iron as scheduled. Procedure Code(s):        ---  Professional ---                           [phone], Esophagogastroduodenoscopy, flexible,                            transoral; with removal of tumor(s), polyp(s), or                            other lesion(s) by snare technique                           43239, 56, Esophagogastroduodenoscopy, flexible,                            transoral; with biopsy, single or multiple Diagnosis Code(s):        --- Professional ---                           K22.8, Other specified diseases of esophagus                           K31.7, Polyp of stomach and duodenum                           D50.9, Iron deficiency anemia,  unspecified CPT copyright 2019 American Medical Association. All rights reserved. The codes documented in this report are preliminary and upon coder review may  be revised to meet current compliance requirements. Maylon Peppers, MD Maylon Peppers,  11/05/2020 11:58:37 AM This report has been signed electronically. Number of Addenda: 0

## 2020-11-05 NOTE — Telephone Encounter (Signed)
Noted thank you

## 2020-11-05 NOTE — Anesthesia Preprocedure Evaluation (Addendum)
Anesthesia Evaluation  Patient identified by MRN, date of birth, ID band Patient awake    Reviewed: Allergy & Precautions, NPO status , Patient's Chart, lab work & pertinent test results, reviewed documented beta blocker date and time   History of Anesthesia Complications Negative for: history of anesthetic complications  Airway Mallampati: III  TM Distance: >3 FB Neck ROM: Full    Dental  (+) Dental Advisory Given, Teeth Intact Fillings :   Pulmonary sleep apnea ,    Pulmonary exam normal breath sounds clear to auscultation       Cardiovascular hypertension, Pt. on medications and Pt. on home beta blockers + angina + CAD and + Cardiac Stents  Normal cardiovascular exam Rhythm:Regular Rate:Normal  ? Prox LAD to Mid LAD lesion is 55% stenosed. ? Mid LAD lesion is 85% stenosed. ? A drug-eluting stent was successfully placed using a STENT SYNERGY DES 3X38. ? Post intervention, there is a 0% residual stenosis. ? Prox RCA lesion is 100% stenosed. ? A drug-eluting stent was successfully placed using a STENT SYNERGY DES 3.5X20. Post intervention, there is a 0% residual stenosis.   1. Successful CTO PCI of the proximal RCA with rotational atherectomy and DES x 1 2. Successful PCI of the mid LAD with rotational atherectomy and DES x 1.     Neuro/Psych PSYCHIATRIC DISORDERS Anxiety Depression  Neuromuscular disease (multiple sclerosis )    GI/Hepatic Neg liver ROS, GERD  Medicated,  Endo/Other  diabetes, Well Controlled, Type 2Hypothyroidism   Renal/GU negative Renal ROS     Musculoskeletal  (+) Arthritis  (back pain),   Abdominal   Peds  Hematology  (+) anemia ,   Anesthesia Other Findings 1. Left ventricular ejection fraction, by estimation, is 60 to 65%. The  left ventricle has normal function. The left ventricle has no regional  wall motion abnormalities. There is moderate left ventricular hypertrophy.  Left  ventricular diastolic parameters are indeterminate.  2. Right ventricular systolic function is normal. The right ventricular  size is normal. There is normal pulmonary artery systolic pressure. The  estimated right ventricular systolic pressure is 56.3 mmHg.  3. The mitral valve is grossly normal. Trivial mitral valve regurgitation.  4. The aortic valve is tricuspid. Aortic valve regurgitation is not visualized.  5. The inferior vena cava is normal in size with greater than 50%  respiratory variability, suggesting right atrial pressure of 3 mmHg.   Reproductive/Obstetrics                           Anesthesia Physical Anesthesia Plan  ASA: 3  Anesthesia Plan: General   Post-op Pain Management:    Induction: Intravenous  PONV Risk Score and Plan: Propofol infusion  Airway Management Planned: Nasal Cannula and Natural Airway  Additional Equipment:   Intra-op Plan:   Post-operative Plan:   Informed Consent: I have reviewed the patients History and Physical, chart, labs and discussed the procedure including the risks, benefits and alternatives for the proposed anesthesia with the patient or authorized representative who has indicated his/her understanding and acceptance.     Dental advisory given  Plan Discussed with: Surgeon and CRNA  Anesthesia Plan Comments:         Anesthesia Quick Evaluation

## 2020-11-05 NOTE — Discharge Instructions (Addendum)
You are being discharged to home.  Resume your previous diet.  We are waiting for your pathology results.  Continue IV iron as scheduled. Your physician has recommended a repeat colonoscopy for surveillance based on pathology results.  Schedule capsule endoscopy. Take Bentyl as needed for abdominal pain    OFFICE WILL CALL REGARDING THE CAPSULE ENDOSCOPY APPOINTMENT

## 2020-11-05 NOTE — Telephone Encounter (Signed)
Per op note patient needs capsule study scheduled

## 2020-11-05 NOTE — Anesthesia Postprocedure Evaluation (Signed)
Anesthesia Post Note  Patient: Mary Strong  Procedure(s) Performed: COLONOSCOPY WITH PROPOFOL ESOPHAGOGASTRODUODENOSCOPY (EGD) WITH PROPOFOL POLYPECTOMY BIOPSY  Patient location during evaluation: PACU Anesthesia Type: General Level of consciousness: awake and alert Pain management: pain level controlled Vital Signs Assessment: post-procedure vital signs reviewed and stable Respiratory status: spontaneous breathing Cardiovascular status: blood pressure returned to baseline and stable Postop Assessment: no apparent nausea or vomiting Anesthetic complications: no   No notable events documented.   Last Vitals:  Vitals:   11/05/20 1021  BP: 131/60  Resp: 16  Temp: 36.8 C  SpO2: 96%    Last Pain:  Vitals:   11/05/20 1021  TempSrc: Oral  PainSc: 3                  Shankar Silber

## 2020-11-05 NOTE — Transfer of Care (Signed)
Immediate Anesthesia Transfer of Care Note  Patient: Mary Strong  Procedure(s) Performed: COLONOSCOPY WITH PROPOFOL ESOPHAGOGASTRODUODENOSCOPY (EGD) WITH PROPOFOL POLYPECTOMY BIOPSY  Patient Location: Endoscopy Unit  Anesthesia Type:General  Level of Consciousness: awake  Airway & Oxygen Therapy: Patient Spontanous Breathing  Post-op Assessment: Report given to RN  Post vital signs: Reviewed  Last Vitals:  Vitals Value Taken Time  BP    Temp    Pulse    Resp    SpO2      Last Pain:  Vitals:   11/05/20 1021  TempSrc: Oral  PainSc: 3       Patients Stated Pain Goal: 5 (75/43/60 6770)  Complications: No notable events documented.

## 2020-11-06 ENCOUNTER — Encounter (INDEPENDENT_AMBULATORY_CARE_PROVIDER_SITE_OTHER): Payer: Self-pay

## 2020-11-07 ENCOUNTER — Encounter (INDEPENDENT_AMBULATORY_CARE_PROVIDER_SITE_OTHER): Payer: Self-pay | Admitting: *Deleted

## 2020-11-07 LAB — SURGICAL PATHOLOGY

## 2020-11-08 ENCOUNTER — Encounter (HOSPITAL_COMMUNITY): Payer: Self-pay

## 2020-11-08 ENCOUNTER — Other Ambulatory Visit: Payer: Self-pay

## 2020-11-08 ENCOUNTER — Inpatient Hospital Stay (HOSPITAL_COMMUNITY): Payer: BC Managed Care – PPO

## 2020-11-08 VITALS — BP 132/72 | HR 75 | Temp 96.9°F | Resp 18

## 2020-11-08 DIAGNOSIS — D509 Iron deficiency anemia, unspecified: Secondary | ICD-10-CM | POA: Diagnosis not present

## 2020-11-08 MED ORDER — SODIUM CHLORIDE 0.9 % IV SOLN
750.0000 mg | Freq: Once | INTRAVENOUS | Status: AC
  Administered 2020-11-08: 750 mg via INTRAVENOUS
  Filled 2020-11-08: qty 15

## 2020-11-08 MED ORDER — SODIUM CHLORIDE 0.9 % IV SOLN
Freq: Once | INTRAVENOUS | Status: AC
Start: 2020-11-08 — End: 2020-11-08

## 2020-11-08 MED ORDER — ACETAMINOPHEN 325 MG PO TABS
650.0000 mg | ORAL_TABLET | Freq: Once | ORAL | Status: AC
  Administered 2020-11-08: 650 mg via ORAL
  Filled 2020-11-08: qty 2

## 2020-11-08 MED ORDER — FAMOTIDINE 20 MG IN NS 100 ML IVPB
20.0000 mg | Freq: Once | INTRAVENOUS | Status: AC
  Administered 2020-11-08: 20 mg via INTRAVENOUS
  Filled 2020-11-08: qty 20

## 2020-11-08 NOTE — Progress Notes (Signed)
Chaplain engaged in an initial visit with Mary Strong.  Mary Strong shared her love of history and the [employer] with chaplain.  Mary Strong has a love of the [place], [place].  She also loves to read.  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    Chaplain and Mary Strong spent time discussing traveling, creating balance in life, and self-awareness.  Mary Strong recognizes what she needs and how she desires for things to be done.  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  Chaplain celebrated the ways Mary Strong knows herself and is able to communicate that to others.  Mary Strong also shared about the ways MS has changed her life.  She realizes what types of environments best suit her and what she can and cannot do.  She has worked to find balance amongst the challenges and transitions of her life.  Chaplain could assess the Mary Strong finds joy in being with her children and grandchildren, reading and traveling.  Chaplain offered listening, support, and presence.

## 2020-11-08 NOTE — Patient Instructions (Signed)
[facility]  Discharge Instructions: Thank you for choosing [facility] to provide your oncology and hematology care.  If you have a lab appointment with the Cancer Center, please come in thru the Main Entrance and check in at the main information desk.     We strive to give you quality time with your provider. You may need to reschedule your appointment if you arrive late (15 or more minutes).  Arriving late affects you and other patients whose appointments are after yours.  Also, if you miss three or more appointments without notifying the office, you may be dismissed from the clinic at the provider's discretion.      For prescription refill requests, have your pharmacy contact our office and allow 72 hours for refills to be completed.     Should you have questions after your visit or need to cancel or reschedule your appointment, please contact [facility] [phone]  and follow the prompts.  Office hours are 8:00 a.m. to 4:30 p.m. Monday - Friday. Please note that voicemails left after 4:00 p.m. may not be returned until the following business day.  We are closed weekends and major holidays. You have access to a nurse at all times for urgent questions. Please call the main number to the clinic [phone] and follow the prompts.  For any non-urgent questions, you may also contact your provider using MyChart. We now offer e-Visits for anyone 18 and older to request care online for non-urgent symptoms. For details visit [email or web address].   Also download the MyChart app! Go to the app store, search "MyChart", open the app, select [facility], and log in with your MyChart username and password.  Due to Covid, a mask is required upon entering the hospital/clinic. If you do not have a mask, one will be given to you upon arrival. For doctor visits, patients may have 1 support person aged 18 or older with them. For treatment visits, patients cannot have  anyone with them due to current Covid guidelines and our immunocompromised population.  

## 2020-11-08 NOTE — Progress Notes (Signed)
Iron infusion given per orders. Patient tolerated it well without problems. Vitals stable and discharged home from clinic ambulatory. Follow up as scheduled.  

## 2020-11-13 ENCOUNTER — Encounter (HOSPITAL_COMMUNITY): Payer: Self-pay | Admitting: Gastroenterology

## 2020-11-18 ENCOUNTER — Ambulatory Visit (HOSPITAL_COMMUNITY)
Admission: RE | Admit: 2020-11-18 | Discharge: 2020-11-18 | Disposition: A | Discharge status: Home / Self Care | Payer: BC Managed Care – PPO | Attending: Gastroenterology | Admitting: Gastroenterology

## 2020-11-18 ENCOUNTER — Encounter (HOSPITAL_COMMUNITY)
Admission: RE | Disposition: A | Discharge status: Home / Self Care | Payer: Self-pay | Source: Home / Self Care | Attending: Gastroenterology

## 2020-11-18 DIAGNOSIS — D508 Other iron deficiency anemias: Secondary | ICD-10-CM

## 2020-11-18 DIAGNOSIS — Z9889 Other specified postprocedural states: Secondary | ICD-10-CM

## 2020-11-18 HISTORY — PX: GIVENS CAPSULE STUDY: SHX5432

## 2020-11-18 SURGERY — IMAGING PROCEDURE, GI TRACT, INTRALUMINAL, VIA CAPSULE
Anesthesia: Monitor Anesthesia Care

## 2020-11-19 NOTE — Procedures (Signed)
Small Bowel Givens Capsule Study Procedure date:  11/18/2020  Referring Provider:  PCP PCP:  Dr. Donia Guiles, Day G, MD  Indication for procedure:  Iron deficiency anemia  Findings: Bowel preparation of the small bowel was adequate.  The capsule reached the anal orifice.  There was no presence of any vascular, ulcerations, polyps or any other lesions during the evaluation of the esophagus, stomach and small bowel that explain the iron deficiency anemia  First Gastric image:  00:00:48 First Duodenal image: 00:29:40 First Cecal image: 04:55:37 Gastric Passage time: 0h 19m Small Bowel Passage time:  4h 35m   Summary & Recommendations: No presence of any vascular alterations, ulcerations, polyps or any other lesions explain the iron deficiency anemia, possibly related to malabsorption.  -Continue with IV iron infusion as scheduled in the past -Patient was advised to notify us if she presents any overt gastrointestinal bleeding such as melena or hematochezia, or if she presents any worsening drop in her hemoglobin despite taking IV iron  I personally communicated these recommendations to the patient  Maylon Peppers, MD Gastroenterology and Hepatology [facility]

## 2020-11-20 ENCOUNTER — Encounter (HOSPITAL_COMMUNITY): Payer: Self-pay | Admitting: Gastroenterology

## 2020-12-01 DIAGNOSIS — Z7984 Long term (current) use of oral hypoglycemic drugs: Secondary | ICD-10-CM | POA: Diagnosis not present

## 2020-12-01 DIAGNOSIS — G35 Multiple sclerosis: Secondary | ICD-10-CM | POA: Diagnosis not present

## 2020-12-01 DIAGNOSIS — E1142 Type 2 diabetes mellitus with diabetic polyneuropathy: Secondary | ICD-10-CM | POA: Diagnosis not present

## 2020-12-01 DIAGNOSIS — I10 Essential (primary) hypertension: Secondary | ICD-10-CM | POA: Diagnosis not present

## 2020-12-17 DIAGNOSIS — E113293 Type 2 diabetes mellitus with mild nonproliferative diabetic retinopathy without macular edema, bilateral: Secondary | ICD-10-CM | POA: Diagnosis not present

## 2020-12-25 ENCOUNTER — Ambulatory Visit: Payer: Medicare Other | Admitting: Cardiology

## 2020-12-26 ENCOUNTER — Encounter (INDEPENDENT_AMBULATORY_CARE_PROVIDER_SITE_OTHER): Payer: Self-pay

## 2020-12-27 NOTE — Telephone Encounter (Signed)
Report has been released and mailed to patient

## 2021-01-01 DIAGNOSIS — G35 Multiple sclerosis: Secondary | ICD-10-CM | POA: Diagnosis not present

## 2021-01-01 DIAGNOSIS — Z7984 Long term (current) use of oral hypoglycemic drugs: Secondary | ICD-10-CM | POA: Diagnosis not present

## 2021-01-01 DIAGNOSIS — E1142 Type 2 diabetes mellitus with diabetic polyneuropathy: Secondary | ICD-10-CM | POA: Diagnosis not present

## 2021-01-01 DIAGNOSIS — I10 Essential (primary) hypertension: Secondary | ICD-10-CM | POA: Diagnosis not present

## 2021-01-03 ENCOUNTER — Ambulatory Visit (HOSPITAL_COMMUNITY): Payer: Medicare Other | Admitting: Physician Assistant

## 2021-01-07 ENCOUNTER — Other Ambulatory Visit (HOSPITAL_COMMUNITY): Payer: Self-pay | Admitting: *Deleted

## 2021-01-07 DIAGNOSIS — D509 Iron deficiency anemia, unspecified: Secondary | ICD-10-CM

## 2021-01-10 DIAGNOSIS — E782 Mixed hyperlipidemia: Secondary | ICD-10-CM | POA: Diagnosis not present

## 2021-01-10 DIAGNOSIS — E7800 Pure hypercholesterolemia, unspecified: Secondary | ICD-10-CM | POA: Diagnosis not present

## 2021-01-10 DIAGNOSIS — D519 Vitamin B12 deficiency anemia, unspecified: Secondary | ICD-10-CM | POA: Diagnosis not present

## 2021-01-10 DIAGNOSIS — D649 Anemia, unspecified: Secondary | ICD-10-CM | POA: Diagnosis not present

## 2021-01-10 DIAGNOSIS — E119 Type 2 diabetes mellitus without complications: Secondary | ICD-10-CM | POA: Diagnosis not present

## 2021-01-10 DIAGNOSIS — E1165 Type 2 diabetes mellitus with hyperglycemia: Secondary | ICD-10-CM | POA: Diagnosis not present

## 2021-01-10 DIAGNOSIS — D529 Folate deficiency anemia, unspecified: Secondary | ICD-10-CM | POA: Diagnosis not present

## 2021-01-15 ENCOUNTER — Other Ambulatory Visit (HOSPITAL_COMMUNITY): Payer: Self-pay | Admitting: *Deleted

## 2021-01-15 ENCOUNTER — Other Ambulatory Visit (HOSPITAL_COMMUNITY): Payer: Self-pay

## 2021-01-15 DIAGNOSIS — D509 Iron deficiency anemia, unspecified: Secondary | ICD-10-CM

## 2021-01-15 NOTE — Progress Notes (Signed)
Ferritin and iron/tibc wasn't drawn at last visit.  Provider wants it before the appt on 9/22.  Patient was scheduled for labs 1 hour prior to appt and orders placed.

## 2021-01-20 DIAGNOSIS — Z23 Encounter for immunization: Secondary | ICD-10-CM | POA: Diagnosis not present

## 2021-01-20 DIAGNOSIS — E7849 Other hyperlipidemia: Secondary | ICD-10-CM | POA: Diagnosis not present

## 2021-01-20 DIAGNOSIS — E039 Hypothyroidism, unspecified: Secondary | ICD-10-CM | POA: Diagnosis not present

## 2021-01-20 DIAGNOSIS — G35 Multiple sclerosis: Secondary | ICD-10-CM | POA: Diagnosis not present

## 2021-01-20 DIAGNOSIS — F331 Major depressive disorder, recurrent, moderate: Secondary | ICD-10-CM | POA: Diagnosis not present

## 2021-01-22 NOTE — Progress Notes (Signed)
Mary Strong, [place] [address]   CLINIC:  Medical Oncology/Hematology  PCP:  Caryl Bis, MD [place] [id number] [phone]   REASON FOR VISIT:  Follow-up for iron deficiency anemia  PRIOR THERAPY: Oral iron tablet (unable to tolerate)  CURRENT THERAPY: Intermittent IV iron (Injectafer x2 on 11/01/2020 and 11/08/2020)  INTERVAL HISTORY:  Mary Strong 60 y.o. female returns for routine follow-up of iron deficiency anemia, suspected to be secondary to malabsorption.  She was seen for initial hematology consultation by Dr. Chryl Heck on 10/25/2020, and received IV iron repletion with Injectafer 750 mg on 11/01/2020 and 11/08/2020.  At today's visit, she reports feeling fair.  No recent hospitalizations, surgeries, or changes in baseline health status.  She felt a bit fatigued for a few days after her IV iron infusion, but after that, she noticed an improvement in her energy after the second infusion.  She has felt a bit more fatigued this week due to the heat and it's effect on her MS. overall though, she reports improved energy compared to levels before IV iron  She denies any obvious bleeding episodes such as hematemesis, epistaxis, hematochezia, or melena.  She reports that her ice craving/pica symptoms resolved after IV iron.  She does have some chronic dyspnea on exertion and lightheadedness with standing.  She denies any chest pain or syncopal episodes.  She has 40% energy today and 75% appetite. She endorses that she is maintaining a stable weight.    REVIEW OF SYSTEMS:  Review of Systems  Constitutional:  Positive for fatigue. Negative for appetite change, chills, diaphoresis, fever and unexpected weight change.  HENT:   Negative for lump/mass and nosebleeds.   Eyes:  Negative for eye problems.  Respiratory:  Positive for shortness of breath (with exertion). Negative for cough and hemoptysis.   Cardiovascular:  Positive for  palpitations. Negative for chest pain and leg swelling.  Gastrointestinal:  Negative for abdominal pain, blood in stool, constipation, diarrhea, nausea and vomiting.  Genitourinary:  Negative for hematuria.   Skin: Negative.   Neurological:  Positive for headaches and light-headedness. Negative for dizziness.  Hematological:  Does not bruise/bleed easily.  Psychiatric/Behavioral:  Positive for depression. The patient is nervous/anxious.      PAST MEDICAL/SURGICAL HISTORY:  Past Medical History:  Diagnosis Date   Anxiety    Arthritis    CAD (coronary artery disease)    Heavily calcified vessels, DES to CTO RCA and DES to mid LAD September 2020   Constipation    Depression    Essential hypertension    Fatty liver    GERD (gastroesophageal reflux disease)    History of migraine    History of sleep apnea    Currently not using CPAP   Hypothyroidism    Multiple sclerosis (HCC)    Neurogenic bladder    Neuropathy    Palpitations    Restless leg syndrome    Skin cancer    Basal cell cancer on forehead, strong family hx of adenocarcinoma   Type 2 diabetes mellitus ([place])    Past Surgical History:  Procedure Laterality Date   BIOPSY  11/05/2020   Procedure: BIOPSY;  Surgeon: Harvel Quale, MD;  [facility];  Service: Gastroenterology;;   CESAREAN SECTION     x 2   CHOLECYSTECTOMY     COLONOSCOPY WITH PROPOFOL N/A 11/05/2020   Procedure: COLONOSCOPY WITH PROPOFOL;  Surgeon: Harvel Quale, MD;  [facility];  Service: Gastroenterology;  Laterality: N/A;  12:30   CORONARY ATHERECTOMY N/A 01/18/2019   Procedure: CORONARY ATHERECTOMY;  Surgeon: Martinique, Peter M, MD;  Location: [facility];  Service: Cardiovascular;  Laterality: N/A;   CORONARY CTO INTERVENTION N/A 01/18/2019   Procedure: CORONARY CTO INTERVENTION;  Surgeon: Martinique, Peter M, MD;  Location: [facility];  Service: Cardiovascular;  Laterality: N/A;   CORONARY STENT  INTERVENTION N/A 01/18/2019   Procedure: CORONARY STENT INTERVENTION;  Surgeon: Martinique, Peter M, MD;  Location: [facility];  Service: Cardiovascular;  Laterality: N/A;   ENDOMETRIAL ABLATION     ESOPHAGOGASTRODUODENOSCOPY (EGD) WITH PROPOFOL N/A 11/05/2020   Procedure: ESOPHAGOGASTRODUODENOSCOPY (EGD) WITH PROPOFOL;  Surgeon: Harvel Quale, MD;  [facility];  Service: Gastroenterology;  Laterality: N/A;   GIVENS CAPSULE STUDY N/A 11/18/2020   Procedure: GIVENS CAPSULE STUDY;  Surgeon: Harvel Quale, MD;  [facility];  Service: Gastroenterology;  Laterality: N/A;  7:30   JOINT REPLACEMENT     LEFT HEART CATH AND CORONARY ANGIOGRAPHY N/A 01/10/2019   Procedure: LEFT HEART CATH AND CORONARY ANGIOGRAPHY;  Surgeon: Belva Crome, MD;  Location: [facility];  Service: Cardiovascular;  Laterality: N/A;   POLYPECTOMY  11/05/2020   Procedure: POLYPECTOMY;  Surgeon: Harvel Quale, MD;  [facility];  Service: Gastroenterology;;   TONSILLECTOMY     TOTAL HIP ARTHROPLASTY Right 12/04/2015   TOTAL HIP ARTHROPLASTY Right 12/04/2015   Procedure: TOTAL HIP ARTHROPLASTY ANTERIOR APPROACH;  Surgeon: Marybelle Killings, MD;  Location: [facility];  Service: Orthopedics;  Laterality: Right;   TOTAL HIP ARTHROPLASTY Left 01/24/2016   Procedure: TOTAL HIP ARTHROPLASTY ANTERIOR APPROACH;  Surgeon: Marybelle Killings, MD;  Location: [facility];  Service: Orthopedics;  Laterality: Left;  anterior approach   TUBAL LIGATION       SOCIAL HISTORY:  Social History   Socioeconomic History   Marital status: Married    Spouse name: Not on file   Number of children: Not on file   Years of education: Not on file   Highest education level: Not on file  Occupational History   Not on file  Tobacco Use   Smoking status: Never   Smokeless tobacco: Never  Vaping Use   Vaping Use: Never used  Substance and Sexual Activity   Alcohol use: Yes    Alcohol/week: 0.0  standard drinks    Comment: "none since 2014"   Drug use: Not Currently    Comment: none in 6 minths   Sexual activity: Not Currently  Other Topics Concern   Not on file  Social History Narrative   Not on file   Social Determinants of Health   Financial Resource Strain: Low Risk    Difficulty of Paying Living Expenses: Not hard at all  Food Insecurity: No Food Insecurity   Worried About Charity fundraiser in the Last Year: Never true   [place] in the Last Year: Never true  Transportation Needs: No Transportation Needs   Lack of Transportation (Medical): No   Lack of Transportation (Non-Medical): No  Physical Activity: Inactive   Days of Exercise per Week: 0 days   Minutes of Exercise per Session: 0 min  Stress: No Stress Concern Present   Feeling of Stress : Only a little  Social Connections: Engineer, building services of Communication with Friends and Family: More than three times a week  Frequency of Social Gatherings with Friends and Family: More than three times a week   Attends Religious Services: More than 4 times per year   Active Member of [employer] or Organizations: Yes   Attends Music therapist: More than 4 times per year   Marital Status: Married  Human resources officer Violence: Not At Risk   Fear of Current or Ex-Partner: No   Emotionally Abused: No   Physically Abused: No   Sexually Abused: No    FAMILY HISTORY:  Family History  Problem Relation Age of Onset   Multiple sclerosis Mother    Arthritis/Rheumatoid Mother    Psoriasis Father    Atrial fibrillation Father    Hypertension Brother     CURRENT MEDICATIONS:  Outpatient Encounter Medications as of 01/23/2021  Medication Sig   aspirin 81 MG tablet Take 81 mg by mouth daily.   BIOTIN PO Take 10,000 mcg by mouth daily.   diazepam (VALIUM) 5 MG tablet TAKE ONE TABLET BY MOUTH THREE TIMES DAILY AS NEEDED (Patient taking differently: Take 5 mg by mouth 3 (three) times daily as  needed for muscle spasms.)   dicyclomine (BENTYL) 10 MG capsule Take 1 capsule (10 mg total) by mouth every 12 (twelve) hours as needed (abdominal pain).   DULoxetine (CYMBALTA) 60 MG capsule Take 1 capsule (60 mg total) by mouth at bedtime. (Patient taking differently: Take 120 mg by mouth at bedtime.)   furosemide (LASIX) 40 MG tablet Take 40 mg by mouth daily as needed for fluid or edema.   gabapentin (NEURONTIN) 600 MG tablet Take 1 tablet (600 mg total) by mouth 3 (three) times daily.   levothyroxine (SYNTHROID) 112 MCG tablet Take 112 mcg by mouth daily before breakfast.   metFORMIN (GLUCOPHAGE) 500 MG tablet Take 1 tablet (500 mg total) by mouth 2 (two) times daily.   metFORMIN (GLUCOPHAGE-XR) 500 MG 24 hr tablet Take 1,000 mg by mouth 2 (two) times daily.   metoprolol tartrate (LOPRESSOR) 50 MG tablet TAKE ONE TABLET BY MOUTH TWICE DAILY (Patient taking differently: Take 50 mg by mouth 2 (two) times daily.)   mometasone (ELOCON) 0.1 % cream Apply 1 application topically daily as needed (psoriasis).    nitroGLYCERIN (NITROSTAT) 0.4 MG SL tablet Place 1 tablet (0.4 mg total) under the tongue every 5 (five) minutes as needed. (Patient taking differently: Place 0.4 mg under the tongue every 5 (five) minutes as needed for chest pain.)   pantoprazole (PROTONIX) 40 MG tablet Take 40 mg by mouth daily.    rosuvastatin (CRESTOR) 5 MG tablet Take 5 mg by mouth every other day.   No facility-administered encounter medications on file as of 01/23/2021.    ALLERGIES:  Allergies  Allergen Reactions   Shellfish Allergy Anaphylaxis   Ciprofloxacin Hives   Copaxone [Glatiramer Acetate] Hives   Penicillins Hives and Rash    Bad headaches Did it involve swelling of the face/tongue/throat, SOB, or low BP? No Did it involve sudden or severe rash/hives, skin peeling, or any reaction on the inside of your mouth or nose? No Did you need to seek medical attention at a hospital or doctor's office? No When  did it last happen?      6 - 7 years If all above answers are "NO", may proceed with cephalosporin use.    Vancomycin Itching     PHYSICAL EXAM:  ECOG PERFORMANCE STATUS: 1 - Symptomatic but completely ambulatory  There were no vitals filed for this visit. There were no  vitals filed for this visit. Physical Exam Constitutional:      Appearance: Normal appearance. She is obese.  HENT:     Head: Normocephalic and atraumatic.     Mouth/Throat:     Mouth: Mucous membranes are moist.  Eyes:     Extraocular Movements: Extraocular movements intact.     Pupils: Pupils are equal, round, and reactive to light.  Cardiovascular:     Rate and Rhythm: Normal rate and regular rhythm.     Pulses: Normal pulses.     Heart sounds: Normal heart sounds.  Pulmonary:     Effort: Pulmonary effort is normal.     Breath sounds: Normal breath sounds.  Abdominal:     General: Bowel sounds are normal.     Palpations: Abdomen is soft.     Tenderness: There is no abdominal tenderness.  Musculoskeletal:        General: No swelling.     Right lower leg: No edema.     Left lower leg: No edema.  Lymphadenopathy:     Cervical: No cervical adenopathy.  Skin:    General: Skin is warm and dry.  Neurological:     General: No focal deficit present.     Mental Status: She is alert and oriented to person, place, and time.  Psychiatric:        Mood and Affect: Mood normal.        Behavior: Behavior normal.     LABORATORY DATA:  I have reviewed the labs as listed.  CBC    Component Value Date/Time   WBC 7.6 10/25/2020 1421   RBC 4.87 10/25/2020 1421   HGB 11.2 (L) 10/25/2020 1421   HGB 11.9 09/18/2015 1155   HCT 38.1 10/25/2020 1421   HCT 34.5 09/18/2015 1155   PLT 249 10/25/2020 1421   PLT 215 09/18/2015 1155   MCV 78.2 (L) 10/25/2020 1421   MCV 84 09/18/2015 1155   MCH 23.0 (L) 10/25/2020 1421   MCHC 29.4 (L) 10/25/2020 1421   RDW 17.2 (H) 10/25/2020 1421   RDW 16.8 (H) 09/18/2015 1155    LYMPHSABS 1.8 10/25/2020 1421   LYMPHSABS 2.4 09/18/2015 1155   MONOABS 0.5 10/25/2020 1421   EOSABS 0.1 10/25/2020 1421   EOSABS 0.6 (H) 09/18/2015 1155   BASOSABS 0.0 10/25/2020 1421   BASOSABS 0.0 09/18/2015 1155   CMP Latest Ref Rng & Units 02/27/2019 01/19/2019 01/18/2019  Glucose 65 - 139 mg/dL 95 137(H) 135(H)  BUN 7 - 25 mg/dL 12 7 8   Creatinine 0.50 - 1.05 mg/dL 1.02 1.01(H) 1.08(H)  Sodium 135 - 146 mmol/L 140 141 141  Potassium 3.5 - 5.3 mmol/L 4.5 4.2 4.2  Chloride 98 - 110 mmol/L 106 107 108  CO2 20 - 32 mmol/L 26 25 28   Calcium 8.6 - 10.4 mg/dL 9.3 8.7(L) 9.2  Total Protein 6.1 - 8.1 g/dL 6.7 - -  Total Bilirubin 0.2 - 1.2 mg/dL 0.4 - -  Alkaline Phos 38 - 126 U/L - - -  AST 10 - 35 U/L 19 - -  ALT 6 - 29 U/L 13 - -    DIAGNOSTIC IMAGING:  I have independently reviewed the relevant imaging and discussed with the patient.  ASSESSMENT & PLAN: 1.  Iron deficiency anemia - Etiology suspected to be malabsorption in the setting of long-term PPI use - Extensive GI work-up in July 2022 (EGD, colonoscopy, capsule study) did not reveal any source of blood loss - She is unable to tolerate  oral iron supplements due to side effects - Received IV iron repletion with Injectafer x2 on 11/01/2020 and 11/08/2020 - Fatigue improved after IV iron - No obvious bleeding such as bright red blood per rectum, melena, or epistaxis - Most recent iron panel (01/23/2021) shows improved ferritin 166 with iron saturation 27% and TIBC 324 - PLAN: No indication for IV iron at this time.  We will repeat CBC and iron panel in 4 months, followed by telephone visit.  2.  Other history - She is a retired Marine scientist, previously worked at [employer] and [employer] - [place] is also positive for MS   PLAN SUMMARY & DISPOSITION: -Labs in 4 months (drawn by PCP) - Phone visit after labs  All questions were answered. The patient knows to call the clinic with any problems, questions or concerns.  Medical decision  making: Low  Time spent on visit: I spent 20 minutes counseling the patient face to face. The total time spent in the appointment was 30 minutes and more than 50% was on counseling.   Harriett Rush, PA-C  01/23/2021 12:04 PM

## 2021-01-23 ENCOUNTER — Other Ambulatory Visit: Payer: Self-pay

## 2021-01-23 ENCOUNTER — Inpatient Hospital Stay (HOSPITAL_COMMUNITY): Payer: BC Managed Care – PPO | Attending: Physician Assistant | Admitting: Physician Assistant

## 2021-01-23 ENCOUNTER — Inpatient Hospital Stay (HOSPITAL_COMMUNITY): Payer: BC Managed Care – PPO

## 2021-01-23 VITALS — BP 131/83 | HR 68 | Temp 97.6°F | Resp 20 | Wt 192.7 lb

## 2021-01-23 DIAGNOSIS — D509 Iron deficiency anemia, unspecified: Secondary | ICD-10-CM | POA: Insufficient documentation

## 2021-01-23 DIAGNOSIS — G35 Multiple sclerosis: Secondary | ICD-10-CM | POA: Diagnosis not present

## 2021-01-23 LAB — IRON AND TIBC
Iron: 89 ug/dL (ref 28–170)
Saturation Ratios: 27 % (ref 10.4–31.8)
TIBC: 324 ug/dL (ref 250–450)
UIBC: 235 ug/dL

## 2021-01-23 LAB — FERRITIN: Ferritin: 166 ng/mL (ref 11–307)

## 2021-01-23 NOTE — Patient Instructions (Signed)
[place] at [facility] Discharge Instructions  You were seen today by Tarri Abernethy PA-C for your iron deficiency anemia.  As we have discussed, you do not have any signs of blood loss at this time, so we suspect that your iron deficiency is due to poor absorption of iron in your stomach and small intestine.  Your iron levels have improved after your last IV iron treatment.  You do not require any additional IV iron at this time.  We will recheck your labs in 4 months and follow-up with a phone visit to see if you need any additional iron at that time.  LABS: Please have your primary care provider check a CBC, iron/TIBC, & ferritin level at your routine labs in 4 months.  Please make sure that these results are faxed to our office prior to your follow-up appointment.  MEDICATIONS: No changes to home medications  FOLLOW-UP APPOINTMENT: Phone visit in 4 months, after labs   Thank you for choosing [place] at [facility] to provide your oncology and hematology care.  To afford each patient quality time with our provider, please arrive at least 15 minutes before your scheduled appointment time.   If you have a lab appointment with the [facility] please come in thru the Main Entrance and check in at the main information desk.  You need to re-schedule your appointment should you arrive 10 or more minutes late.  We strive to give you quality time with our providers, and arriving late affects you and other patients whose appointments are after yours.  Also, if you no show three or more times for appointments you may be dismissed from the clinic at the providers discretion.     Again, thank you for choosing [facility]  Our hope is that these requests will decrease the amount of time that you wait before being seen by our physicians.       _____________________________________________________________  Should you have questions  after your visit to [facility], please contact our office at [phone] and follow the prompts.  Our office hours are 8:00 a.m. and 4:30 p.m. Monday - Friday.  Please note that voicemails left after 4:00 p.m. may not be returned until the following business day.  We are closed weekends and major holidays.  You do have access to a nurse 24-7, just call the main number to the clinic [phone] and do not press any options, hold on the line and a nurse will answer the phone.    For prescription refill requests, have your pharmacy contact our office and allow 72 hours.    Due to Covid, you will need to wear a mask upon entering the hospital. If you do not have a mask, a mask will be given to you at the Main Entrance upon arrival. For doctor visits, patients may have 1 support person age 81 or older with them. For treatment visits, patients can not have anyone with them due to social distancing guidelines and our immunocompromised population.

## 2021-02-10 ENCOUNTER — Encounter (INDEPENDENT_AMBULATORY_CARE_PROVIDER_SITE_OTHER): Payer: Self-pay | Admitting: Gastroenterology

## 2021-02-10 ENCOUNTER — Other Ambulatory Visit: Payer: Self-pay

## 2021-02-10 ENCOUNTER — Ambulatory Visit (INDEPENDENT_AMBULATORY_CARE_PROVIDER_SITE_OTHER): Payer: BC Managed Care – PPO | Admitting: Gastroenterology

## 2021-02-10 VITALS — BP 125/85 | HR 79 | Temp 98.5°F | Ht 63.0 in | Wt 195.4 lb

## 2021-02-10 DIAGNOSIS — K582 Mixed irritable bowel syndrome: Secondary | ICD-10-CM | POA: Diagnosis not present

## 2021-02-10 DIAGNOSIS — R079 Chest pain, unspecified: Secondary | ICD-10-CM | POA: Diagnosis not present

## 2021-02-10 NOTE — Patient Instructions (Addendum)
Continue current diet Bentyl as needed for abdominal pain Follow up with cardiologist for chest pain Can take Pepcid episodes as needed for chest pain/heartburn Continue pantoprazole every day

## 2021-02-10 NOTE — Progress Notes (Signed)
Maylon Peppers, M.D. Gastroenterology & Hepatology [facility] [address] [place], [place] [address]  Primary Care Physician: Caryl Bis, MD [place] [address]  I will communicate my assessment and recommendations to the referring MD via EMR.  Problems: IBS-M Iron deficiency anemia   History of Present Illness: Mary Strong is a 60 y.o. female with past medical history of anxiety, coronary artery disease status post stent placement, depression, hypertension, fatty liver, GERD, multiple sclerosis complicated by neurogenic bladder and neuropathy, IBS, iron deficiency anemia, restless leg syndrome and diabetes type 2, who presents for follow up of IBS.  The patient was last seen on 10/10/2020. At that time, the patient was scheduled for an EGD and a colonoscopy with findings described below.  As no source of bleeding was found she underwent a subsequent capsule endoscopy on 11/18/2020 which was completely unremarkable.  It was considered that her iron deficiency was secondary to decreased absorption of iron.  As she was referred to hematology, she received iron infusion with improvement of her iron stores.  Her most recent iron stores on 01/23/2021 showed an iron of 89, TIBC of 324 and ferritin of 166.  Patient reports that she has presented episodes of fecal urgency, for which she has avoided eating out of her home. Has not presented fecal soiling but she is fearful it may happen so she eats mostly at home, which works for her. She mostly eats vegetables and fruits, tries to avoid meat as it causes abdominal pain. At home she does not have any problem eating at home as she has a restroom close to her. Has an average of 3-4 bowel movements per day. She has not tried using antidiarrheals as she had an episode of constipation when she took it prior, so she would like to avoid it.  Her abdominal pain is better, has only used a  few pills of Bentyl since her last prescription.  Reports that for years she had chest pain, was found to have significan CAD and had stents placed. She reports she was doing well but last weekend she had an episode of pressure-like chest pain for which she took nitroglycerin without any improvement. She reports it eventually improved after taking Tums. She reports that she did not go to the ER and will see the cardiologist on 10/27.  The patient denies having any nausea, vomiting, fever, chills, hematochezia, melena, hematemesis, abdominal distention, jaundice, pruritus or weight loss.  Last EGD: 11/05/2020, normal esophagus, multiple polyps, 1 was removed which was consistent with a fundic gland polyp, normal duodenum with negative biopsies for celiac disease Last Colonoscopy: 11/05/2020, normal terminal ileum, 4 polyps in the transverse and ascending colon (tubular adenomas), 5 mm polyp in the distal rectum (possible hypertrophic papilla), nonbleeding internal hemorrhoids.  Recommend to repeat in  Past Medical History: Past Medical History:  Diagnosis Date   Anxiety    Arthritis    CAD (coronary artery disease)    Heavily calcified vessels, DES to CTO RCA and DES to mid LAD September 2020   Constipation    Depression    Essential hypertension    Fatty liver    GERD (gastroesophageal reflux disease)    History of migraine    History of sleep apnea    Currently not using CPAP   Hypothyroidism    Multiple sclerosis (HCC)    Neurogenic bladder    Neuropathy    Palpitations  Restless leg syndrome    Skin cancer    Basal cell cancer on forehead, strong family hx of adenocarcinoma   Type 2 diabetes mellitus ([place])     Past Surgical History: Past Surgical History:  Procedure Laterality Date   BIOPSY  11/05/2020   Procedure: BIOPSY;  Surgeon: Harvel Quale, MD;  [facility];  Service: Gastroenterology;;   CESAREAN SECTION     x 2   CHOLECYSTECTOMY      COLONOSCOPY WITH PROPOFOL N/A 11/05/2020   Procedure: COLONOSCOPY WITH PROPOFOL;  Surgeon: Harvel Quale, MD;  [facility];  Service: Gastroenterology;  Laterality: N/A;  12:30   CORONARY ATHERECTOMY N/A 01/18/2019   Procedure: CORONARY ATHERECTOMY;  Surgeon: Martinique, Peter M, MD;  Location: [place] [facility];  Service: Cardiovascular;  Laterality: N/A;   CORONARY CTO INTERVENTION N/A 01/18/2019   Procedure: CORONARY CTO INTERVENTION;  Surgeon: Martinique, Peter M, MD;  Location: [place] [facility];  Service: Cardiovascular;  Laterality: N/A;   CORONARY STENT INTERVENTION N/A 01/18/2019   Procedure: CORONARY STENT INTERVENTION;  Surgeon: Martinique, Peter M, MD;  Location: [place] [facility];  Service: Cardiovascular;  Laterality: N/A;   ENDOMETRIAL ABLATION     ESOPHAGOGASTRODUODENOSCOPY (EGD) WITH PROPOFOL N/A 11/05/2020   Procedure: ESOPHAGOGASTRODUODENOSCOPY (EGD) WITH PROPOFOL;  Surgeon: Harvel Quale, MD;  [facility];  Service: Gastroenterology;  Laterality: N/A;   GIVENS CAPSULE STUDY N/A 11/18/2020   Procedure: GIVENS CAPSULE STUDY;  Surgeon: Harvel Quale, MD;  [facility];  Service: Gastroenterology;  Laterality: N/A;  7:30   JOINT REPLACEMENT     LEFT HEART CATH AND CORONARY ANGIOGRAPHY N/A 01/10/2019   Procedure: LEFT HEART CATH AND CORONARY ANGIOGRAPHY;  Surgeon: Belva Crome, MD;  Location: [place] [facility];  Service: Cardiovascular;  Laterality: N/A;   POLYPECTOMY  11/05/2020   Procedure: POLYPECTOMY;  Surgeon: Harvel Quale, MD;  [facility];  Service: Gastroenterology;;   TONSILLECTOMY     TOTAL HIP ARTHROPLASTY Right 12/04/2015   TOTAL HIP ARTHROPLASTY Right 12/04/2015   Procedure: TOTAL HIP ARTHROPLASTY ANTERIOR APPROACH;  Surgeon: Marybelle Killings, MD;  Location: [facility];  Service: Orthopedics;  Laterality: Right;   TOTAL HIP ARTHROPLASTY Left 01/24/2016   Procedure: TOTAL HIP ARTHROPLASTY ANTERIOR  APPROACH;  Surgeon: Marybelle Killings, MD;  Location: [facility];  Service: Orthopedics;  Laterality: Left;  anterior approach   TUBAL LIGATION      Family History: Family History  Problem Relation Age of Onset   Multiple sclerosis Mother    Arthritis/Rheumatoid Mother    Psoriasis Father    Atrial fibrillation Father    Hypertension Brother     Social History: Social History   Tobacco Use  Smoking Status Never  Smokeless Tobacco Never   Social History   Substance and Sexual Activity  Alcohol Use Yes   Alcohol/week: 0.0 standard drinks   Comment: "none since 2014"   Social History   Substance and Sexual Activity  Drug Use Not Currently   Comment: none in 6 minths    Allergies: Allergies  Allergen Reactions   Shellfish Allergy Anaphylaxis   Ciprofloxacin Hives   Copaxone [Glatiramer Acetate] Hives   Penicillins Hives and Rash    Bad headaches Did it involve swelling of the face/tongue/throat, SOB, or low BP? No Did it involve sudden or severe rash/hives, skin peeling, or any reaction on the inside of your mouth or nose? No Did you  need to seek medical attention at a hospital or doctor's office? No When did it last happen?      6 - 7 years If all above answers are "NO", may proceed with cephalosporin use.    Vancomycin Itching    Medications: Current Outpatient Medications  Medication Sig Dispense Refill   aspirin 81 MG tablet Take 81 mg by mouth daily.     BIOTIN PO Take 10,000 mcg by mouth daily.     diazepam (VALIUM) 5 MG tablet TAKE ONE TABLET BY MOUTH THREE TIMES DAILY AS NEEDED (Patient taking differently: Take 5 mg by mouth 3 (three) times daily as needed for muscle spasms.) 90 tablet 5   dicyclomine (BENTYL) 10 MG capsule Take 1 capsule (10 mg total) by mouth every 12 (twelve) hours as needed (abdominal pain). 60 capsule 5   DULoxetine (CYMBALTA) 60 MG capsule Take 1 capsule (60 mg total) by mouth at bedtime. (Patient taking differently: Take 60 mg by mouth at  bedtime.) 30 capsule 11   furosemide (LASIX) 40 MG tablet Take 40 mg by mouth daily as needed for fluid or edema.     gabapentin (NEURONTIN) 600 MG tablet Take 1 tablet (600 mg total) by mouth 3 (three) times daily. 90 tablet 5   levothyroxine (SYNTHROID) 112 MCG tablet Take 112 mcg by mouth daily before breakfast.     metFORMIN (GLUCOPHAGE) 500 MG tablet Take 1 tablet (500 mg total) by mouth 2 (two) times daily. (Patient taking differently: Take 1,000 mg by mouth 2 (two) times daily.)     metoprolol tartrate (LOPRESSOR) 50 MG tablet TAKE ONE TABLET BY MOUTH TWICE DAILY (Patient taking differently: Take 50 mg by mouth 2 (two) times daily.) 180 tablet 1   mometasone (ELOCON) 0.1 % cream Apply 1 application topically daily as needed (psoriasis).     nitroGLYCERIN (NITROSTAT) 0.4 MG SL tablet Place 1 tablet (0.4 mg total) under the tongue every 5 (five) minutes as needed. (Patient taking differently: Place 0.4 mg under the tongue every 5 (five) minutes as needed for chest pain.) 25 tablet 2   pantoprazole (PROTONIX) 40 MG tablet Take 40 mg by mouth daily.      rosuvastatin (CRESTOR) 5 MG tablet Take 5 mg by mouth every other day.     No current facility-administered medications for this visit.    Review of Systems: GENERAL: negative for malaise, night sweats HEENT: No changes in hearing or vision, no nose bleeds or other nasal problems. NECK: Negative for lumps, goiter, pain and significant neck swelling RESPIRATORY: Negative for cough, wheezing CARDIOVASCULAR: Negative for chest pain, leg swelling, palpitations, orthopnea GI: SEE HPI MUSCULOSKELETAL: Negative for joint pain or swelling, back pain, and muscle pain. SKIN: Negative for lesions, rash PSYCH: Negative for sleep disturbance, mood disorder and recent psychosocial stressors. HEMATOLOGY Negative for prolonged bleeding, bruising easily, and swollen nodes. ENDOCRINE: Negative for cold or heat intolerance, polyuria, polydipsia and  goiter. NEURO: negative for tremor, gait imbalance, syncope and seizures. The remainder of the review of systems is noncontributory.   Physical Exam: BP 125/85 (BP Location: Left Arm, Patient Position: Sitting, Cuff Size: Large)   Pulse 79   Temp 98.5 F (36.9 C) (Oral)   Ht 5\' 3"  (1.6 m)   Wt 195 lb 6.4 oz (88.6 kg)   BMI 34.61 kg/m  GENERAL: The patient is AO x3, in no acute distress. HEENT: Head is normocephalic and atraumatic. EOMI are intact. Mouth is well hydrated and without lesions. NECK: Supple. No  masses LUNGS: Clear to auscultation. No presence of rhonchi/wheezing/rales. Adequate chest expansion HEART: RRR, normal s1 and s2. ABDOMEN: Soft, nontender, no guarding, no peritoneal signs, and nondistended. BS +. No masses. EXTREMITIES: Without any cyanosis, clubbing, rash, lesions or edema. NEUROLOGIC: AOx3, no focal motor deficit. SKIN: no jaundice, no rashes  Imaging/Labs: as above  I personally reviewed and interpreted the available labs, imaging and endoscopic files.  Impression and Plan: Mary Strong is a 60 y.o. female with past medical history of anxiety, coronary artery disease status post stent placement, depression, hypertension, fatty liver, GERD, multiple sclerosis complicated by neurogenic bladder and neuropathy, IBS, iron deficiency anemia, restless leg syndrome and diabetes type 2, who presents for follow up of IBS.  Patient has presented improvement of her symptoms with her current diet and has felt relief of her abdominal pain when taking the Bentyl as needed.  She feels satisfied with the current management and will continue the current management as she would like to avoid antidiarrheals that make her constipated.  Regarding her chest symptoms, it would be important to see a cardiologist first to rule out any cardiac etiologies given her extensive cardiac history.  In the meantime she can take some Pepcid as needed on top of her pantoprazole to treat  possible GERD related symptoms.  - Continue current diet - Continue with Bentyl as needed for abdominal pain - Follow up with cardiologist for chest pain - Can take Pepcid as needed for chest pain/heartburn episodes - Continue pantoprazole 40 mg every day  All questions were answered.      Harvel Quale, MD Gastroenterology and Hepatology [facility]

## 2021-02-19 ENCOUNTER — Other Ambulatory Visit: Payer: Self-pay | Admitting: Family Medicine

## 2021-02-21 DIAGNOSIS — R059 Cough, unspecified: Secondary | ICD-10-CM | POA: Diagnosis not present

## 2021-02-21 DIAGNOSIS — J329 Chronic sinusitis, unspecified: Secondary | ICD-10-CM | POA: Diagnosis not present

## 2021-02-21 DIAGNOSIS — Z20828 Contact with and (suspected) exposure to other viral communicable diseases: Secondary | ICD-10-CM | POA: Diagnosis not present

## 2021-02-27 ENCOUNTER — Encounter: Payer: Self-pay | Admitting: Cardiology

## 2021-02-27 ENCOUNTER — Ambulatory Visit (INDEPENDENT_AMBULATORY_CARE_PROVIDER_SITE_OTHER): Payer: BC Managed Care – PPO | Admitting: Cardiology

## 2021-02-27 ENCOUNTER — Other Ambulatory Visit: Payer: Self-pay

## 2021-02-27 VITALS — BP 130/98 | HR 68 | Ht 63.0 in | Wt 193.4 lb

## 2021-02-27 DIAGNOSIS — E782 Mixed hyperlipidemia: Secondary | ICD-10-CM

## 2021-02-27 DIAGNOSIS — I25119 Atherosclerotic heart disease of native coronary artery with unspecified angina pectoris: Secondary | ICD-10-CM | POA: Diagnosis not present

## 2021-02-27 MED ORDER — AMLODIPINE BESYLATE 2.5 MG PO TABS
2.5000 mg | ORAL_TABLET | Freq: Every day | ORAL | 1 refills | Status: DC
Start: 2021-02-27 — End: 2021-08-18

## 2021-02-27 NOTE — Progress Notes (Signed)
Cardiology Office Note  Date: 02/27/2021   ID: Mary Strong, DOB 1961-01-28, MRN [id number]  PCP:  Caryl Bis, MD  Cardiologist:  Rozann Lesches, MD Electrophysiologist:  None   Chief Complaint  Patient presents with   Cardiac follow-up    History of Present Illness: Mary Strong is a 59 y.o. female last seen in February by Mr. Leonides Sake NP.  She is here for a routine visit.  States that she is getting all of her RSV, contracted from her grandson who was hospitalized at [facility].  From a cardiac perspective, she did experience chest discomfort earlier this month, could not clearly distinguish whether it was reflux.  Took both nitroglycerin and Tums, does feel like she got some relief from the nitroglycerin ultimately.  She reports compliance with her baseline cardiac regimen as noted below.  She continues to follow with Dr. Quillian Quince.  We went over her medications today and discussed addition of low-dose Norvasc as antianginal.  She previously did not tolerate long-acting nitrates.  I also talked with her about considering an SGLT2 inhibitor such as Jardiance to further reduce cardiac risk.  She will plan to discuss this with Dr. Quillian Quince first.  I personally reviewed her ECG which shows sinus rhythm with incomplete right bundle branch block and borderline low voltage.  Past Medical History:  Diagnosis Date   Anxiety    Arthritis    CAD (coronary artery disease)    Heavily calcified vessels, DES to CTO RCA and DES to mid LAD September 2020   Constipation    Depression    Essential hypertension    Fatty liver    GERD (gastroesophageal reflux disease)    History of migraine    History of sleep apnea    Currently not using CPAP   Hypothyroidism    Multiple sclerosis (HCC)    Neurogenic bladder    Neuropathy    Palpitations    Restless leg syndrome    Skin cancer    Basal cell cancer on forehead, strong family hx of adenocarcinoma   Type 2 diabetes mellitus ([place])      Past Surgical History:  Procedure Laterality Date   BIOPSY  11/05/2020   Procedure: BIOPSY;  Surgeon: Harvel Quale, MD;  [facility];  Service: Gastroenterology;;   CESAREAN SECTION     x 2   CHOLECYSTECTOMY     COLONOSCOPY WITH PROPOFOL N/A 11/05/2020   Procedure: COLONOSCOPY WITH PROPOFOL;  Surgeon: Harvel Quale, MD;  [facility];  Service: Gastroenterology;  Laterality: N/A;  12:30   CORONARY ATHERECTOMY N/A 01/18/2019   Procedure: CORONARY ATHERECTOMY;  Surgeon: Martinique, Peter M, MD;  Location: [place] [facility];  Service: Cardiovascular;  Laterality: N/A;   CORONARY CTO INTERVENTION N/A 01/18/2019   Procedure: CORONARY CTO INTERVENTION;  Surgeon: Martinique, Peter M, MD;  Location: [place] [facility];  Service: Cardiovascular;  Laterality: N/A;   CORONARY STENT INTERVENTION N/A 01/18/2019   Procedure: CORONARY STENT INTERVENTION;  Surgeon: Martinique, Peter M, MD;  Location: [place] [facility];  Service: Cardiovascular;  Laterality: N/A;   ENDOMETRIAL ABLATION     ESOPHAGOGASTRODUODENOSCOPY (EGD) WITH PROPOFOL N/A 11/05/2020   Procedure: ESOPHAGOGASTRODUODENOSCOPY (EGD) WITH PROPOFOL;  Surgeon: Harvel Quale, MD;  [facility];  Service: Gastroenterology;  Laterality: N/A;   GIVENS CAPSULE STUDY N/A 11/18/2020   Procedure: GIVENS CAPSULE STUDY;  Surgeon: Harvel Quale, MD;  [facility];  Service: Gastroenterology;  Laterality: N/A;  7:30   JOINT REPLACEMENT     LEFT HEART CATH AND CORONARY ANGIOGRAPHY N/A 01/10/2019   Procedure: LEFT HEART CATH AND CORONARY ANGIOGRAPHY;  Surgeon: Belva Crome, MD;  Location: [facility];  Service: Cardiovascular;  Laterality: N/A;   POLYPECTOMY  11/05/2020   Procedure: POLYPECTOMY;  Surgeon: Harvel Quale, MD;  [facility];  Service: Gastroenterology;;   TONSILLECTOMY     TOTAL HIP ARTHROPLASTY Right 12/04/2015   TOTAL HIP ARTHROPLASTY  Right 12/04/2015   Procedure: TOTAL HIP ARTHROPLASTY ANTERIOR APPROACH;  Surgeon: Marybelle Killings, MD;  Location: [place];  Service: Orthopedics;  Laterality: Right;   TOTAL HIP ARTHROPLASTY Left 01/24/2016   Procedure: TOTAL HIP ARTHROPLASTY ANTERIOR APPROACH;  Surgeon: Marybelle Killings, MD;  Location: [facility];  Service: Orthopedics;  Laterality: Left;  anterior approach   TUBAL LIGATION      Current Outpatient Medications  Medication Sig Dispense Refill   amLODipine (NORVASC) 2.5 MG tablet Take 1 tablet (2.5 mg total) by mouth daily. 90 tablet 1   aspirin 81 MG tablet Take 81 mg by mouth daily.     BIOTIN PO Take 10,000 mcg by mouth daily.     diazepam (VALIUM) 5 MG tablet TAKE ONE TABLET BY MOUTH THREE TIMES DAILY AS NEEDED 90 tablet 5   dicyclomine (BENTYL) 10 MG capsule Take 1 capsule (10 mg total) by mouth every 12 (twelve) hours as needed (abdominal pain). 60 capsule 5   DULoxetine (CYMBALTA) 60 MG capsule Take 1 capsule (60 mg total) by mouth at bedtime. 30 capsule 11   furosemide (LASIX) 40 MG tablet Take 40 mg by mouth daily as needed for fluid or edema.     gabapentin (NEURONTIN) 600 MG tablet Take 1 tablet (600 mg total) by mouth 3 (three) times daily. 90 tablet 5   levothyroxine (SYNTHROID) 112 MCG tablet Take 112 mcg by mouth daily before breakfast.     metFORMIN (GLUCOPHAGE) 500 MG tablet Take 1 tablet (500 mg total) by mouth 2 (two) times daily.     metoprolol tartrate (LOPRESSOR) 50 MG tablet TAKE ONE TABLET BY MOUTH TWICE DAILY 180 tablet 0   mometasone (ELOCON) 0.1 % cream Apply 1 application topically daily as needed (psoriasis).     nitroGLYCERIN (NITROSTAT) 0.4 MG SL tablet Place 1 tablet (0.4 mg total) under the tongue every 5 (five) minutes as needed. 25 tablet 2   pantoprazole (PROTONIX) 40 MG tablet Take 40 mg by mouth daily.      rosuvastatin (CRESTOR) 5 MG tablet Take 5 mg by mouth every other day.     No current facility-administered medications for this visit.   Allergies:   Shellfish allergy, Ciprofloxacin, Copaxone [glatiramer acetate], Penicillins, and Vancomycin   ROS: No palpitations or syncope.  Physical Exam: VS:  BP (!) 130/98   Pulse 68   Ht 5\' 3"  (1.6 m)   Wt 193 lb 6.4 oz (87.7 kg)   SpO2 97%   BMI 34.26 kg/m , BMI Body mass index is 34.26 kg/m.  Wt Readings from Last 3 Encounters:  02/27/21 193 lb 6.4 oz (87.7 kg)  02/10/21 195 lb 6.4 oz (88.6 kg)  01/23/21 192 lb 10.9 oz (87.4 kg)    General: Patient appears comfortable at rest. HEENT: Conjunctiva and lids normal, wearing a mask. Neck: Supple, no elevated JVP or carotid bruits, no thyromegaly. Lungs: Clear to auscultation, nonlabored breathing at rest. Cardiac: Regular rate and rhythm, no S3 or  significant systolic murmur, no pericardial rub. Extremities: No pitting edema.  ECG:  An ECG dated 01/26/2020 was personally reviewed today and demonstrated:  Sinus rhythm with right bundle branch block and nonspecific ST/T wave changes.  Recent Labwork: 10/25/2020: Hemoglobin 11.2; Platelets 249   Other Studies Reviewed Today:  PCI 01/18/2019: Prox LAD to Mid LAD lesion is 55% stenosed. Mid LAD lesion is 85% stenosed. A drug-eluting stent was successfully placed using a STENT SYNERGY DES 3X38. Post intervention, there is a 0% residual stenosis. Prox RCA lesion is 100% stenosed. A drug-eluting stent was successfully placed using a STENT SYNERGY DES 3.5X20. Post intervention, there is a 0% residual stenosis.   1. Successful CTO PCI of the proximal RCA with rotational atherectomy and DES x 1 2. Successful PCI of the mid LAD with rotational atherectomy and DES x 1.   Lexiscan Myoview 02/01/2020: No diagnostic ST segment changes to indicate ischemia. Small, mild intensity, mid to basal inferolateral defect that is reversible and consistent with ischemia. This is a low risk study. Nuclear stress EF: 71%.  Echocardiogram 02/01/2020:  1. Left ventricular ejection fraction, by estimation, is 60  to 65%. The  left ventricle has normal function. The left ventricle has no regional  wall motion abnormalities. There is moderate left ventricular hypertrophy.  Left ventricular diastolic  parameters are indeterminate.   2. Right ventricular systolic function is normal. The right ventricular  size is normal. There is normal pulmonary artery systolic pressure. The  estimated right ventricular systolic pressure is 19.7 mmHg.   3. The mitral valve is grossly normal. Trivial mitral valve  regurgitation.   4. The aortic valve is tricuspid. Aortic valve regurgitation is not  visualized.   5. The inferior vena cava is normal in size with greater than 50%  respiratory variability, suggesting right atrial pressure of 3 mmHg.   Cardiac monitor October 2021: ZIO XT reviewed.  13 days, 1 hour analyzed.  Predominant rhythm is sinus with heart rate ranging from 57 bpm up to 144 bpm and average heart rate 80 bpm.  There were rare PACs including couplets and triplets representing less than 1% total beats.  There were rare PVCs including couplets representing less than 1% total beats.  There were 5 brief episodes of SVT noted, the longest of which was 9 beats.  There were no sustained events or pauses.  Assessment and Plan:  1.  Multivessel CAD status post DES to CTO RCA and mid LAD in September 2020.  Follow-up Myoview in September of last year was low risk with small region of mid to basal inferolateral ischemia.  She has had some chest discomfort earlier this month.  Plan to add Norvasc 2.5 mg daily as additional antianginal.  Otherwise continue aspirin, Lopressor, and Crestor.  She recalls not tolerating long-acting nitrates previously.  I also talked with her about considering addition of SGLT2 inhibitor such as Jardiance for further cardiac risk reduction.  She will discuss this first with Dr. Quillian Quince.  2.  Mixed hyperlipidemia, remains on Crestor.  Keep follow-up with Dr. Quillian Quince for repeat lab  work.  Medication Adjustments/Labs and Tests Ordered: Current medicines are reviewed at length with the patient today.  Concerns regarding medicines are outlined above.   Tests Ordered: Orders Placed This Encounter  Procedures   EKG 12-Lead    Medication Changes: Meds ordered this encounter  Medications   amLODipine (NORVASC) 2.5 MG tablet    Sig: Take 1 tablet (2.5 mg total) by mouth daily.  Dispense:  90 tablet    Refill:  1    02/27/2021 NEW     Disposition:  Follow up  6 months.  Signed, Satira Sark, MD, [facility] 02/27/2021 10:56 AM    [facility] [place], [place], [place] [address] Phone: [phone]; Fax: [phone]

## 2021-02-27 NOTE — Patient Instructions (Addendum)
Medication Instructions:  Your physician has recommended you make the following change in your medication: Start amlodipine 2.5 mg daily Continue other medications the same  Labwork: none  Testing/Procedures: none  Follow-Up: Your physician recommends that you schedule a follow-up appointment in: 6 months  Any Other Special Instructions Will Be Listed Below (If Applicable).  If you need a refill on your cardiac medications before your next appointment, please call your pharmacy.

## 2021-03-03 DIAGNOSIS — Z7984 Long term (current) use of oral hypoglycemic drugs: Secondary | ICD-10-CM | POA: Diagnosis not present

## 2021-03-03 DIAGNOSIS — I10 Essential (primary) hypertension: Secondary | ICD-10-CM | POA: Diagnosis not present

## 2021-03-03 DIAGNOSIS — E1142 Type 2 diabetes mellitus with diabetic polyneuropathy: Secondary | ICD-10-CM | POA: Diagnosis not present

## 2021-03-03 DIAGNOSIS — G35 Multiple sclerosis: Secondary | ICD-10-CM | POA: Diagnosis not present

## 2021-03-13 DIAGNOSIS — J101 Influenza due to other identified influenza virus with other respiratory manifestations: Secondary | ICD-10-CM | POA: Diagnosis not present

## 2021-03-18 ENCOUNTER — Other Ambulatory Visit: Payer: Self-pay

## 2021-03-18 MED ORDER — GABAPENTIN 600 MG PO TABS: 600.0000 mg | ORAL_TABLET | Freq: Three times a day (TID) | ORAL | 0 refills | Status: DC

## 2021-03-19 ENCOUNTER — Ambulatory Visit: Payer: Medicare Other | Admitting: Neurology

## 2021-04-02 DIAGNOSIS — G35 Multiple sclerosis: Secondary | ICD-10-CM | POA: Diagnosis not present

## 2021-04-02 DIAGNOSIS — I10 Essential (primary) hypertension: Secondary | ICD-10-CM | POA: Diagnosis not present

## 2021-04-02 DIAGNOSIS — E1142 Type 2 diabetes mellitus with diabetic polyneuropathy: Secondary | ICD-10-CM | POA: Diagnosis not present

## 2021-04-02 DIAGNOSIS — Z7984 Long term (current) use of oral hypoglycemic drugs: Secondary | ICD-10-CM | POA: Diagnosis not present

## 2021-05-06 ENCOUNTER — Telehealth: Payer: Self-pay | Admitting: Neurology

## 2021-05-06 MED ORDER — GABAPENTIN 600 MG PO TABS: 600.0000 mg | ORAL_TABLET | Freq: Three times a day (TID) | ORAL | 3 refills | Status: DC

## 2021-05-06 NOTE — Telephone Encounter (Signed)
Mary Strong with [facility] called requesting refill for gabapentin (NEURONTIN) 600 MG tablet. Pharmacy [facility] - [place], [place] - [address]

## 2021-05-06 NOTE — Telephone Encounter (Signed)
Pt has f/u scheduled for 09/22/21. E-scribed refill

## 2021-05-16 DIAGNOSIS — K219 Gastro-esophageal reflux disease without esophagitis: Secondary | ICD-10-CM | POA: Diagnosis not present

## 2021-05-16 DIAGNOSIS — E039 Hypothyroidism, unspecified: Secondary | ICD-10-CM | POA: Diagnosis not present

## 2021-05-16 DIAGNOSIS — E119 Type 2 diabetes mellitus without complications: Secondary | ICD-10-CM | POA: Diagnosis not present

## 2021-05-16 DIAGNOSIS — E1142 Type 2 diabetes mellitus with diabetic polyneuropathy: Secondary | ICD-10-CM | POA: Diagnosis not present

## 2021-05-16 DIAGNOSIS — E1165 Type 2 diabetes mellitus with hyperglycemia: Secondary | ICD-10-CM | POA: Diagnosis not present

## 2021-05-21 DIAGNOSIS — R4582 Worries: Secondary | ICD-10-CM | POA: Diagnosis not present

## 2021-05-21 DIAGNOSIS — E039 Hypothyroidism, unspecified: Secondary | ICD-10-CM | POA: Diagnosis not present

## 2021-05-21 DIAGNOSIS — G35 Multiple sclerosis: Secondary | ICD-10-CM | POA: Diagnosis not present

## 2021-05-21 DIAGNOSIS — E7849 Other hyperlipidemia: Secondary | ICD-10-CM | POA: Diagnosis not present

## 2021-05-21 DIAGNOSIS — F331 Major depressive disorder, recurrent, moderate: Secondary | ICD-10-CM | POA: Diagnosis not present

## 2021-05-23 ENCOUNTER — Telehealth (HOSPITAL_COMMUNITY): Payer: BC Managed Care – PPO | Admitting: Physician Assistant

## 2021-05-26 ENCOUNTER — Telehealth: Payer: Self-pay

## 2021-05-26 MED ORDER — METOPROLOL TARTRATE 50 MG PO TABS: 50.0000 mg | ORAL_TABLET | Freq: Two times a day (BID) | ORAL | 3 refills | Status: DC

## 2021-05-26 NOTE — Telephone Encounter (Signed)
Medication refill request approved for Metop. Tart. 50 mg tablets

## 2021-05-28 ENCOUNTER — Inpatient Hospital Stay (HOSPITAL_COMMUNITY): Payer: BC Managed Care – PPO | Attending: Hematology

## 2021-05-28 DIAGNOSIS — D509 Iron deficiency anemia, unspecified: Secondary | ICD-10-CM | POA: Insufficient documentation

## 2021-05-28 LAB — CBC WITH DIFFERENTIAL/PLATELET
Abs Immature Granulocytes: 0.03 10*3/uL (ref 0.00–0.07)
Basophils Absolute: 0 10*3/uL (ref 0.0–0.1)
Basophils Relative: 0 %
Eosinophils Absolute: 0.1 10*3/uL (ref 0.0–0.5)
Eosinophils Relative: 1 %
HCT: 46.4 % — ABNORMAL HIGH (ref 36.0–46.0)
Hemoglobin: 15.5 g/dL — ABNORMAL HIGH (ref 12.0–15.0)
Immature Granulocytes: 1 %
Lymphocytes Relative: 19 %
Lymphs Abs: 1.2 10*3/uL (ref 0.7–4.0)
MCH: 29.8 pg (ref 26.0–34.0)
MCHC: 33.4 g/dL (ref 30.0–36.0)
MCV: 89.2 fL (ref 80.0–100.0)
Monocytes Absolute: 0.3 10*3/uL (ref 0.1–1.0)
Monocytes Relative: 5 %
Neutro Abs: 4.7 10*3/uL (ref 1.7–7.7)
Neutrophils Relative %: 74 %
Platelets: 209 10*3/uL (ref 150–400)
RBC: 5.2 MIL/uL — ABNORMAL HIGH (ref 3.87–5.11)
RDW: 13.8 % (ref 11.5–15.5)
WBC: 6.3 10*3/uL (ref 4.0–10.5)
nRBC: 0 % (ref 0.0–0.2)

## 2021-05-28 LAB — FERRITIN: Ferritin: 166 ng/mL (ref 11–307)

## 2021-05-28 LAB — IRON AND TIBC
Iron: 66 ug/dL (ref 28–170)
Saturation Ratios: 17 % (ref 10.4–31.8)
TIBC: 397 ug/dL (ref 250–450)
UIBC: 331 ug/dL

## 2021-05-29 NOTE — Progress Notes (Signed)
Virtual Visit via Telephone Note [facility]  I connected with Jeannene Patella  on 05/30/21 at  12:06 PM by telephone and verified that I am speaking with the correct person using two identifiers.  Location: Patient: Home Provider: [facility]   I discussed the limitations, risks, security and privacy concerns of performing an evaluation and management service by telephone and the availability of in person appointments. I also discussed with the patient that there may be a patient responsible charge related to this service. The patient expressed understanding and agreed to proceed.   REASON FOR VISIT:  Follow-up for iron deficiency anemia   PRIOR THERAPY: Oral iron tablet (unable to tolerate)   CURRENT THERAPY: Intermittent IV iron (Injectafer x2 on 11/01/2020 and 11/08/2020)   INTERVAL HISTORY:  Ms. Dinning 61 y.o. female returns for routine follow-up of iron deficiency anemia, suspected to be secondary to malabsorption.  She was last seen by Tarri Abernethy PA-C on 01/23/2021.  Most recent IV iron repletion was with Injectafer 750 mg on 11/01/2020 and 11/08/2020.  Overall, she is doing well.  She has intermittent fatigue that varies day-to-day related to her MS.  She has not noted any bleeding such as epistaxis, hematemesis, hematochezia, or melena.  She denies any pica.  She does have chronic restless leg symptoms and headaches.  She has had some occasional lightheadedness without syncope.  She denies any chest pain or dyspnea on exertion.  She has 75% energy today and 100% appetite.  She endorses that she is maintaining a stable weight.    OBSERVATIONS/OBJECTIVE: Review of Systems  Constitutional:  Negative for chills, diaphoresis, fever, malaise/fatigue and weight loss.  Respiratory:  Negative for cough and shortness of breath.   Cardiovascular:  Negative for chest pain and palpitations.  Gastrointestinal:  Negative for abdominal pain, blood in stool,  melena, nausea and vomiting.  Neurological:  Positive for dizziness and headaches.    PHYSICAL EXAM (per limitations of virtual telephone visit): The patient is alert and oriented x 3, exhibiting adequate mentation, good mood, and ability to speak in full sentences and execute sound judgement.   ASSESSMENT & PLAN: 1.  Iron deficiency anemia - Etiology suspected to be malabsorption in the setting of long-term PPI use - Extensive GI work-up in July 2022 (EGD, colonoscopy, capsule study) did not reveal any source of blood loss - She is unable to tolerate oral iron supplements due to side effects - Received IV iron repletion with Injectafer x2 on 11/01/2020 and 11/08/2020 - No obvious bleeding such as bright red blood per rectum, melena, or epistaxis   - Most recent labs (05/28/2021): Hgb 15.5/MCV 89.2, ferritin 166, iron saturation 17% - PLAN: No indication for IV iron at this time.  We will repeat CBC and iron panel in 4 months, followed by telephone visit.   2.  Other history - She is a retired Marine scientist, previously worked at [employer] and [employer] - [place] is also positive for MS  FOLLOW UP INSTRUCTIONS: Labs and phone visit in 4 months  I discussed the assessment and treatment plan with the patient. The patient was provided an opportunity to ask questions and all were answered. The patient agreed with the plan and demonstrated an understanding of the instructions.   The patient was advised to call back or seek an in-person evaluation if the symptoms worsen or if the condition fails to improve as anticipated.  I provided 12 minutes of non-face-to-face time during this  encounter.   Harriett Rush, PA-C 05/30/2021 1:18 PM

## 2021-05-30 ENCOUNTER — Other Ambulatory Visit: Payer: Self-pay

## 2021-05-30 ENCOUNTER — Encounter (HOSPITAL_COMMUNITY): Payer: Self-pay | Admitting: Physician Assistant

## 2021-05-30 ENCOUNTER — Inpatient Hospital Stay (HOSPITAL_BASED_OUTPATIENT_CLINIC_OR_DEPARTMENT_OTHER): Payer: BC Managed Care – PPO | Admitting: Physician Assistant

## 2021-05-30 DIAGNOSIS — D509 Iron deficiency anemia, unspecified: Secondary | ICD-10-CM | POA: Diagnosis not present

## 2021-06-01 DIAGNOSIS — G35 Multiple sclerosis: Secondary | ICD-10-CM | POA: Diagnosis not present

## 2021-06-01 DIAGNOSIS — E1142 Type 2 diabetes mellitus with diabetic polyneuropathy: Secondary | ICD-10-CM | POA: Diagnosis not present

## 2021-06-01 DIAGNOSIS — I10 Essential (primary) hypertension: Secondary | ICD-10-CM | POA: Diagnosis not present

## 2021-06-06 ENCOUNTER — Ambulatory Visit (INDEPENDENT_AMBULATORY_CARE_PROVIDER_SITE_OTHER): Payer: BC Managed Care – PPO | Admitting: Neurology

## 2021-06-06 ENCOUNTER — Encounter: Payer: Self-pay | Admitting: Neurology

## 2021-06-06 VITALS — BP 138/80 | HR 69 | Ht 63.0 in | Wt 188.0 lb

## 2021-06-06 DIAGNOSIS — F32A Depression, unspecified: Secondary | ICD-10-CM

## 2021-06-06 DIAGNOSIS — G35 Multiple sclerosis: Secondary | ICD-10-CM | POA: Diagnosis not present

## 2021-06-06 DIAGNOSIS — G801 Spastic diplegic cerebral palsy: Secondary | ICD-10-CM

## 2021-06-06 DIAGNOSIS — R208 Other disturbances of skin sensation: Secondary | ICD-10-CM

## 2021-06-06 DIAGNOSIS — R5382 Chronic fatigue, unspecified: Secondary | ICD-10-CM | POA: Diagnosis not present

## 2021-06-06 MED ORDER — GABAPENTIN 600 MG PO TABS: 600.0000 mg | ORAL_TABLET | Freq: Four times a day (QID) | ORAL | 11 refills | Status: DC

## 2021-06-06 NOTE — Progress Notes (Signed)
[facility]  PATIENT: Mary Strong DOB: 01/02/1961  REFERRING CLINICIAN: Gar Ponto  HISTORY FROM: patient REASON FOR VISIT: MS and pain   HISTORICAL  CHIEF COMPLAINT:  Chief Complaint  Patient presents with   Follow-up    RM 1, alone.  Last seen 03/18/2020. Having increased skin issues (itching/burning/hot). Wanting increased dose of gabapentin to take some days, not everyday. Gets very hot. Tripping more/feet hit each other, falling. Last fall 4 weeks ago. Broke tailbone.     HISTORY OF PRESENT ILLNESS:  Mary Strong is a 61 y.o. woman with MS and related symptoms.    Update 06/06/2021: Her MS has been stable off of a DMT since she stopped Tysabri in 2017.     She is walking about the same but stumbles a bit more and she has had some falls, tripping over her feet.  She broke her coccyx 4-5 weeks ago.   .  She has left > right leg spasticity and is on valium.    Sometimes her stride is shorter than other days.   She has hand numbness bilaterally.      She also has allodynia in her trunk, worse with wearing bras ad some clothes.  She does worse in heat.     She is on gabapentin 600 mg po tid -- some days when pain is worse she will go up to qid.  Lamotrigine was not well tolerated.  She was once on nortriptyline for depression and tolerated it (this was before she had MS pain).     She has some urinary hesitancy/retention and urine sometimes shows WBC and she has had a few rounds of antibiotics but some cultures have been negative by her report.   She felt tamsulosin helped the bladder mildly but she stopped due to being on so many pills.  She has rare incontinence.  Constipation resolved woth med changes last year.    She had anemia but no source of bleeding.   She has poor absorption of iron so needed infusions.  She     MS History:   In 2005, she presented with least gait, muscle spasms and pain. SHe was found to have a transverse myelitis.  Initially, she was placed on Avonex. Due to worsening symptoms, she was switched to Tysabri around 2010 or 2011 and had 52 doses. However, she had converted to JCV antibody positive status and her titer rose over the past couple of determinations. Because of that, the Tysabri was discontinued as she had a higher risk of PML and she was started on Tecfidera .  She felt much better on Tysabri and went back on in 2016.   MRI of the brain just shows 2 nonspecific for intense foci an MRI of the cervical spine shows subtle foci at C6 and possibly C4.  She stopped due to elevated JCV Index.   She stopped all DMTs in 2017.     Her mother has MS and RA  Imaging: MRI of the head 10/18/2019 shows scattered T2/FLAIR hyperintense foci in the periventricular and subcortical white matter.  Most of these are nonspecific.  There were no changes compared to 04/09/2018  MRI of the cervical spine 10/16/2019 shows a T2 hyperintense focus at C6-C7 unchanged compared to the 04/16/2018 MRI.  There is moderate spinal stenosis at C5-C6 and mild spinal stenosis at C4-C5 and C6-C7.  MRI of the thoracic spine 10/16/2019 shows T2 hyperintense foci within the spinal cord posteriorly adjacent to C7,  posterolaterally to the right adjacent to T10-T11 and possibly a focus of T9-T10 versus multilevel disc degenerative changes but no nerve root compression or spinal stenosis  REVIEW OF SYSTEMS:  Constitutional: No fevers, chills, sweats, or change in appetite.  She has fatigue Eyes: No visual changes, double vision, eye pain Ear, nose and throat: No hearing loss, ear pain, nasal congestion, sore throat Cardiovascular: No chest pain, palpitations Respiratory:  No shortness of breath at rest or with exertion.   No wheezes GastrointestinaI: No nausea, vomiting, diarrhea, abdominal pain, fecal incontinence Genitourinary:  No dysuria, urinary retention or frequency.  No nocturia. Musculoskeletal:  Pain is improved Integumentary: No rash,  pruritus, skin lesions Neurological: as above Psychiatric: She has depression and mild anxiety Endocrine: No palpitations, diaphoresis, change in appetite, change in weigh or increased thirst Hematologic/Lymphatic:  No anemia, purpura, petechiae. Allergic/Immunologic: No itchy/runny eyes, nasal congestion, recent allergic reactions, rashes  ALLERGIES: Allergies  Allergen Reactions   Shellfish Allergy Anaphylaxis   Ciprofloxacin Hives   Copaxone [Glatiramer Acetate] Hives   Penicillins Hives and Rash    Bad headaches Did it involve swelling of the face/tongue/throat, SOB, or low BP? No Did it involve sudden or severe rash/hives, skin peeling, or any reaction on the inside of your mouth or nose? No Did you need to seek medical attention at a hospital or doctor's office? No When did it last happen?      6 - 7 years If all above answers are "NO", may proceed with cephalosporin use.    Vancomycin Itching    HOME MEDICATIONS:   Current Outpatient Medications:    amLODipine (NORVASC) 2.5 MG tablet, Take 1 tablet (2.5 mg total) by mouth daily., Disp: 90 tablet, Rfl: 1   aspirin 81 MG tablet, Take 81 mg by mouth daily., Disp: , Rfl:    BIOTIN PO, Take 10,000 mcg by mouth daily., Disp: , Rfl:    diazepam (VALIUM) 5 MG tablet, TAKE ONE TABLET BY MOUTH THREE TIMES DAILY AS NEEDED, Disp: 90 tablet, Rfl: 5   dicyclomine (BENTYL) 10 MG capsule, Take 1 capsule (10 mg total) by mouth every 12 (twelve) hours as needed (abdominal pain)., Disp: 60 capsule, Rfl: 5   DULoxetine (CYMBALTA) 60 MG capsule, Take 1 capsule (60 mg total) by mouth at bedtime., Disp: 30 capsule, Rfl: 11   furosemide (LASIX) 40 MG tablet, Take 40 mg by mouth daily as needed for fluid or edema., Disp: , Rfl:    levothyroxine (SYNTHROID) 112 MCG tablet, Take 112 mcg by mouth daily before breakfast., Disp: , Rfl:    metFORMIN (GLUCOPHAGE) 500 MG tablet, Take 1 tablet (500 mg total) by mouth 2 (two) times daily., Disp: , Rfl:     metoprolol tartrate (LOPRESSOR) 50 MG tablet, Take 1 tablet (50 mg total) by mouth 2 (two) times daily., Disp: 180 tablet, Rfl: 3   mometasone (ELOCON) 0.1 % cream, Apply 1 application topically daily as needed (psoriasis)., Disp: , Rfl:    nitroGLYCERIN (NITROSTAT) 0.4 MG SL tablet, Place 1 tablet (0.4 mg total) under the tongue every 5 (five) minutes as needed., Disp: 25 tablet, Rfl: 2   pantoprazole (PROTONIX) 40 MG tablet, Take 40 mg by mouth daily. , Disp: , Rfl:    rosuvastatin (CRESTOR) 5 MG tablet, Take 5 mg by mouth every other day., Disp: , Rfl:    gabapentin (NEURONTIN) 600 MG tablet, Take 1 tablet (600 mg total) by mouth 4 (four) times daily., Disp: 120 tablet, Rfl: 11  PAST MEDICAL HISTORY: Past Medical History:  Diagnosis Date   Anxiety    Arthritis    CAD (coronary artery disease)    Heavily calcified vessels, DES to CTO RCA and DES to mid LAD September 2020   Constipation    Depression    Essential hypertension    Fatty liver    GERD (gastroesophageal reflux disease)    History of migraine    History of sleep apnea    Currently not using CPAP   Hypothyroidism    Multiple sclerosis (HCC)    Neurogenic bladder    Neuropathy    Palpitations    Restless leg syndrome    Skin cancer    Basal cell cancer on forehead, strong family hx of adenocarcinoma   Type 2 diabetes mellitus ([place])     PAST SURGICAL HISTORY: Past Surgical History:  Procedure Laterality Date   BIOPSY  11/05/2020   Procedure: BIOPSY;  Surgeon: Harvel Quale, MD;  [facility];  Service: Gastroenterology;;   CESAREAN SECTION     x 2   CHOLECYSTECTOMY     COLONOSCOPY WITH PROPOFOL N/A 11/05/2020   Procedure: COLONOSCOPY WITH PROPOFOL;  Surgeon: Harvel Quale, MD;  [facility];  Service: Gastroenterology;  Laterality: N/A;  12:30   CORONARY ATHERECTOMY N/A 01/18/2019   Procedure: CORONARY ATHERECTOMY;  Surgeon: Martinique, Peter M, MD;  Location: [place] [facility];  Service: Cardiovascular;  Laterality: N/A;   CORONARY CTO INTERVENTION N/A 01/18/2019   Procedure: CORONARY CTO INTERVENTION;  Surgeon: Martinique, Peter M, MD;  Location: [place] [facility];  Service: Cardiovascular;  Laterality: N/A;   CORONARY STENT INTERVENTION N/A 01/18/2019   Procedure: CORONARY STENT INTERVENTION;  Surgeon: Martinique, Peter M, MD;  Location: [place] [facility];  Service: Cardiovascular;  Laterality: N/A;   ENDOMETRIAL ABLATION     ESOPHAGOGASTRODUODENOSCOPY (EGD) WITH PROPOFOL N/A 11/05/2020   Procedure: ESOPHAGOGASTRODUODENOSCOPY (EGD) WITH PROPOFOL;  Surgeon: Harvel Quale, MD;  [facility];  Service: Gastroenterology;  Laterality: N/A;   GIVENS CAPSULE STUDY N/A 11/18/2020   Procedure: GIVENS CAPSULE STUDY;  Surgeon: Harvel Quale, MD;  [facility];  Service: Gastroenterology;  Laterality: N/A;  7:30   JOINT REPLACEMENT     LEFT HEART CATH AND CORONARY ANGIOGRAPHY N/A 01/10/2019   Procedure: LEFT HEART CATH AND CORONARY ANGIOGRAPHY;  Surgeon: Belva Crome, MD;  Location: [place] [facility];  Service: Cardiovascular;  Laterality: N/A;   POLYPECTOMY  11/05/2020   Procedure: POLYPECTOMY;  Surgeon: Harvel Quale, MD;  [facility];  Service: Gastroenterology;;   TONSILLECTOMY     TOTAL HIP ARTHROPLASTY Right 12/04/2015   TOTAL HIP ARTHROPLASTY Right 12/04/2015   Procedure: TOTAL HIP ARTHROPLASTY ANTERIOR APPROACH;  Surgeon: Marybelle Killings, MD;  Location: [place];  Service: Orthopedics;  Laterality: Right;   TOTAL HIP ARTHROPLASTY Left 01/24/2016   Procedure: TOTAL HIP ARTHROPLASTY ANTERIOR APPROACH;  Surgeon: Marybelle Killings, MD;  Location: [place];  Service: Orthopedics;  Laterality: Left;  anterior approach   TUBAL LIGATION      FAMILY HISTORY: Family History  Problem Relation Age of Onset   Multiple sclerosis Mother    Arthritis/Rheumatoid Mother    Psoriasis Father    Atrial fibrillation Father     Hypertension Brother     SOCIAL HISTORY:  Social History   Socioeconomic History   Marital status: Married    Spouse name: Not on file  Number of children: Not on file   Years of education: Not on file   Highest education level: Not on file  Occupational History   Not on file  Tobacco Use   Smoking status: Never   Smokeless tobacco: Never  Vaping Use   Vaping Use: Never used  Substance and Sexual Activity   Alcohol use: Yes    Alcohol/week: 0.0 standard drinks    Comment: "none since 2014"   Drug use: Not Currently    Comment: none in 6 minths   Sexual activity: Not Currently  Other Topics Concern   Not on file  Social History Narrative   Not on file   Social Determinants of Health   Financial Resource Strain: Low Risk    Difficulty of Paying Living Expenses: Not hard at all  Food Insecurity: No Food Insecurity   Worried About Charity fundraiser in the Last Year: Never true   [place] in the Last Year: Never true  Transportation Needs: No Transportation Needs   Lack of Transportation (Medical): No   Lack of Transportation (Non-Medical): No  Physical Activity: Inactive   Days of Exercise per Week: 0 days   Minutes of Exercise per Session: 0 min  Stress: No Stress Concern Present   Feeling of Stress : Only a little  Social Connections: Engineer, building services of Communication with Friends and Family: More than three times a week   Frequency of Social Gatherings with Friends and Family: More than three times a week   Attends Religious Services: More than 4 times per year   Active Member of [employer] or Organizations: Yes   Attends Music therapist: More than 4 times per year   Marital Status: Married  Human resources officer Violence: Not At Risk   Fear of Current or Ex-Partner: No   Emotionally Abused: No   Physically Abused: No   Sexually Abused: No     PHYSICAL EXAM  Vitals:   06/06/21 1001  BP: 138/80  Pulse: 69  SpO2: 98%   Weight: 188 lb (85.3 kg)  Height: 5\' 3"  (1.6 m)    Body mass index is 33.3 kg/m.   General: The patient is well-developed and well-nourished and in no acute distress  Musculoskeletal: She has tenderness in the mid thoracic spine and the trapezius muscles.  Neurologic Exam  Mental status: The patient is alert and oriented x 3 at the time of the examination. The patient has apparent normal recent and remote memory, with an apparently normal attention span and concentration ability.   Speech is normal.  Mood:  She is tearful initially and expresses frustration with her pain.    Cranial nerves: Extraocular movements are full.  Facial strength and sensation is normal.  Trapezius strength is strong.  No obvious hearing deficits are noted.  Motor:  Muscle bulk is normal and tone is mildly increased in legs.   Strength is  5 / 5 in all 4 extremities except 4+/5 in the left leg hip flexors  Sensory: She has normal touch and vibration sensation in the arms.  She has reduced left leg vibration sensation.  Coordination: Finger-nose-finger is performed well.  Heel-to-shin is slightly reduced on the left  Gait and station: Station is normal.  Gait is minimally wide but the tandem gait is wide.. Romberg is positive  Reflexes: Deep tendon reflexes are symmetric into in the arms and 3 in the legs.   ______________________________________________  ASSESSMENT AND PLAN  Multiple sclerosis (HCC)  Dysesthesia  Chronic fatigue  Spastic diplegia (HCC)  Depression, unspecified depression type  1.   She will continue off of a disease modifying therapy.  She has not had any exacerbations since being off she has a low plaque burden..  She has been stable with no new exacerbations.  We will check MRI q3 years.   2.   If urinary dysfunction worsens we can restart tamsulosin or change to Rapaflo. 3.   Continue diazepam for spasticity. 4.   Gabapentin up to 600 mg po qid.      Return in 6 months  or sooner if there are new or worsening neurologic symptoms.  Nathaneil Feagans A. Felecia Shelling, MD, PhD 11/05/3008, 4:04 PM Certified in Neurology, Clinical Neurophysiology, Sleep Medicine, Pain Medicine and Neuroimaging  [facility] [address], [place], [place] [address] [phone]'

## 2021-07-01 DIAGNOSIS — I10 Essential (primary) hypertension: Secondary | ICD-10-CM | POA: Diagnosis not present

## 2021-07-01 DIAGNOSIS — E1142 Type 2 diabetes mellitus with diabetic polyneuropathy: Secondary | ICD-10-CM | POA: Diagnosis not present

## 2021-07-01 DIAGNOSIS — G35 Multiple sclerosis: Secondary | ICD-10-CM | POA: Diagnosis not present

## 2021-07-26 ENCOUNTER — Encounter (HOSPITAL_COMMUNITY): Payer: Self-pay

## 2021-07-26 ENCOUNTER — Other Ambulatory Visit: Payer: Self-pay

## 2021-07-26 ENCOUNTER — Emergency Department (HOSPITAL_COMMUNITY): Payer: BC Managed Care – PPO

## 2021-07-26 ENCOUNTER — Observation Stay (HOSPITAL_COMMUNITY)
Admission: EM | Admit: 2021-07-26 | Discharge: 2021-07-28 | Disposition: A | Discharge status: Home / Self Care | Payer: BC Managed Care – PPO | Attending: Internal Medicine | Admitting: Internal Medicine

## 2021-07-26 DIAGNOSIS — E876 Hypokalemia: Secondary | ICD-10-CM | POA: Diagnosis not present

## 2021-07-26 DIAGNOSIS — D696 Thrombocytopenia, unspecified: Secondary | ICD-10-CM | POA: Diagnosis not present

## 2021-07-26 DIAGNOSIS — Z955 Presence of coronary angioplasty implant and graft: Secondary | ICD-10-CM | POA: Insufficient documentation

## 2021-07-26 DIAGNOSIS — R079 Chest pain, unspecified: Secondary | ICD-10-CM | POA: Diagnosis present

## 2021-07-26 DIAGNOSIS — Z7984 Long term (current) use of oral hypoglycemic drugs: Secondary | ICD-10-CM | POA: Diagnosis not present

## 2021-07-26 DIAGNOSIS — R0602 Shortness of breath: Secondary | ICD-10-CM | POA: Diagnosis not present

## 2021-07-26 DIAGNOSIS — I25110 Atherosclerotic heart disease of native coronary artery with unstable angina pectoris: Secondary | ICD-10-CM | POA: Diagnosis not present

## 2021-07-26 DIAGNOSIS — Z85820 Personal history of malignant melanoma of skin: Secondary | ICD-10-CM | POA: Diagnosis not present

## 2021-07-26 DIAGNOSIS — Z96643 Presence of artificial hip joint, bilateral: Secondary | ICD-10-CM | POA: Insufficient documentation

## 2021-07-26 DIAGNOSIS — I200 Unstable angina: Secondary | ICD-10-CM | POA: Diagnosis not present

## 2021-07-26 DIAGNOSIS — E039 Hypothyroidism, unspecified: Secondary | ICD-10-CM

## 2021-07-26 DIAGNOSIS — Z79899 Other long term (current) drug therapy: Secondary | ICD-10-CM | POA: Insufficient documentation

## 2021-07-26 DIAGNOSIS — Z7982 Long term (current) use of aspirin: Secondary | ICD-10-CM | POA: Insufficient documentation

## 2021-07-26 DIAGNOSIS — E118 Type 2 diabetes mellitus with unspecified complications: Secondary | ICD-10-CM | POA: Diagnosis not present

## 2021-07-26 DIAGNOSIS — K219 Gastro-esophageal reflux disease without esophagitis: Secondary | ICD-10-CM

## 2021-07-26 DIAGNOSIS — I10 Essential (primary) hypertension: Secondary | ICD-10-CM | POA: Diagnosis not present

## 2021-07-26 DIAGNOSIS — I2510 Atherosclerotic heart disease of native coronary artery without angina pectoris: Secondary | ICD-10-CM | POA: Diagnosis not present

## 2021-07-26 DIAGNOSIS — E782 Mixed hyperlipidemia: Secondary | ICD-10-CM | POA: Diagnosis not present

## 2021-07-26 DIAGNOSIS — E119 Type 2 diabetes mellitus without complications: Secondary | ICD-10-CM | POA: Insufficient documentation

## 2021-07-26 DIAGNOSIS — R0789 Other chest pain: Secondary | ICD-10-CM | POA: Diagnosis not present

## 2021-07-26 LAB — BASIC METABOLIC PANEL
Anion gap: 9 (ref 5–15)
BUN: 10 mg/dL (ref 6–20)
CO2: 24 mmol/L (ref 22–32)
Calcium: 8.9 mg/dL (ref 8.9–10.3)
Chloride: 107 mmol/L (ref 98–111)
Creatinine, Ser: 0.88 mg/dL (ref 0.44–1.00)
GFR, Estimated: >60 mL/min (ref >60)
Glucose, Bld: 114 mg/dL — ABNORMAL HIGH (ref 70–99)
Potassium: 3.2 mmol/L — ABNORMAL LOW (ref 3.5–5.1)
Sodium: 140 mmol/L (ref 135–145)

## 2021-07-26 LAB — CBC
HCT: 39.4 % (ref 36.0–46.0)
Hemoglobin: 13.3 g/dL (ref 12.0–15.0)
MCH: 29.8 pg (ref 26.0–34.0)
MCHC: 33.8 g/dL (ref 30.0–36.0)
MCV: 88.3 fL (ref 80.0–100.0)
Platelets: 148 10*3/uL — ABNORMAL LOW (ref 150–400)
RBC: 4.46 MIL/uL (ref 3.87–5.11)
RDW: 14.2 % (ref 11.5–15.5)
WBC: 5.9 10*3/uL (ref 4.0–10.5)
nRBC: 0 % (ref 0.0–0.2)

## 2021-07-26 LAB — MAGNESIUM: Magnesium: 1.8 mg/dL (ref 1.7–2.4)

## 2021-07-26 LAB — TROPONIN I (HIGH SENSITIVITY): Troponin I (High Sensitivity): 3 ng/L (ref <18)

## 2021-07-26 MED ORDER — POTASSIUM CHLORIDE CRYS ER 20 MEQ PO TBCR
40.0000 meq | EXTENDED_RELEASE_TABLET | Freq: Once | ORAL | Status: AC
  Administered 2021-07-26: 40 meq via ORAL
  Filled 2021-07-26: qty 2

## 2021-07-26 MED ORDER — ENOXAPARIN SODIUM 40 MG/0.4ML IJ SOSY
40.0000 mg | PREFILLED_SYRINGE | INTRAMUSCULAR | Status: DC
  Administered 2021-07-27: 40 mg via SUBCUTANEOUS
  Filled 2021-07-26 (×2): qty 0.4

## 2021-07-26 NOTE — ED Notes (Signed)
ED Provider at bedside. 

## 2021-07-26 NOTE — ED Triage Notes (Signed)
Pt arrived from home via [facility] c/o intermittent Chest Pain since this morning. Pt was sitting in a meeting when CP began. CP came back again this even. Pt took NTG tablets X 3 and 4 baby aspirin PTA. [facility] did not administer anything further PTA. Pt reports pain was a 4 but now is about a 2 in sternal left breast area, with numbness /tingling down left arm. ?

## 2021-07-26 NOTE — H&P (Signed)
?History and Physical  ? ? ?Patient: Mary Strong ZOX:[id number] DOB: 1961-01-29 ?DOA: 07/26/2021 ?DOS: the patient was seen and examined on 07/27/2021 ?PCP: Caryl Bis, MD  ?Patient coming from: Home ? ?Chief Complaint:  ?Chief Complaint  ?Patient presents with  ? Chest Pain  ? ?HPI: Mary Strong is a 61 y.o. female with medical history significant for hypertension, hypothyroidism, type II DM, GERD, hyperlipidemia, CAD s/p DES to CTO RCA and mid LAD in September 2020 who presents to the emergency department due to chest pain which started this morning while at a meeting, chest pain was midsternal, nonreproducible, nonradiating and self resolved after about 40 minutes, it was rated as 3/10 on pain scale.  Chest pain returned in the evening around 8 PM while watching TV, chest pain was similar to the one in the morning, but worse with chest pain increasing to 4-5/10 on pain scale and with tingling sensation of left arm and extension of midsternal chest pain to left sternal breast area.  She took 3 nitro pills with 5-minute intervals with only minimal relief from the chest pain, patient also took 4 baby aspirin with slight improvement in chest pain.  EMS was activated and patient was taken to the ED for further evaluation and management.  She denies fever, chills, nausea, vomiting, abdominal pain, diarrhea or constipation. ? ?ED Course:  ?In the emergency department, she was hemodynamically stable.  Work-up in the ED showed normal CBC except for mild thrombocytopenia and normal BMP except for hypokalemia and hyperglycemia, magnesium 1.8, troponin x2 was flat at 3. ?Chest x-ray showed no evidence of acute chest process with limited view of the bases ?Potassium was replenished.  Hospitalist was asked to admit patient for further evaluation and management. ? ?Review of Systems: ?Review of systems as noted in the HPI. All other systems reviewed and are negative. ? ? ?Past Medical History:  ?Diagnosis Date  ?  Anxiety   ? Arthritis   ? CAD (coronary artery disease)   ? Heavily calcified vessels, DES to CTO RCA and DES to mid LAD September 2020  ? Constipation   ? Depression   ? Essential hypertension   ? Fatty liver   ? GERD (gastroesophageal reflux disease)   ? History of migraine   ? History of sleep apnea   ? Currently not using CPAP  ? Hypothyroidism   ? Multiple sclerosis ([place])   ? Neurogenic bladder   ? Neuropathy   ? Palpitations   ? Restless leg syndrome   ? Skin cancer   ? Basal cell cancer on forehead, strong family hx of adenocarcinoma  ? Type 2 diabetes mellitus ([place])   ? ?Past Surgical History:  ?Procedure Laterality Date  ? BIOPSY  11/05/2020  ? Procedure: BIOPSY;  Surgeon: Montez Morita, Quillian Quince, MD;  [facility];  Service: Gastroenterology;;  ? CESAREAN SECTION    ? x 2  ? CHOLECYSTECTOMY    ? COLONOSCOPY WITH PROPOFOL N/A 11/05/2020  ? Procedure: COLONOSCOPY WITH PROPOFOL;  Surgeon: Harvel Quale, MD;  [facility];  Service: Gastroenterology;  Laterality: N/A;  12:30  ? CORONARY ATHERECTOMY N/A 01/18/2019  ? Procedure: CORONARY ATHERECTOMY;  Surgeon: Martinique, Peter M, MD;  Location: [facility];  Service: Cardiovascular;  Laterality: N/A;  ? CORONARY CTO INTERVENTION N/A 01/18/2019  ? Procedure: CORONARY CTO INTERVENTION;  Surgeon: Martinique, Peter M, MD;  Location: [facility];  Service: Cardiovascular;  Laterality: N/A;  ? CORONARY  STENT INTERVENTION N/A 01/18/2019  ? Procedure: CORONARY STENT INTERVENTION;  Surgeon: Martinique, Peter M, MD;  Location: [facility];  Service: Cardiovascular;  Laterality: N/A;  ? ENDOMETRIAL ABLATION    ? ESOPHAGOGASTRODUODENOSCOPY (EGD) WITH PROPOFOL N/A 11/05/2020  ? Procedure: ESOPHAGOGASTRODUODENOSCOPY (EGD) WITH PROPOFOL;  Surgeon: Harvel Quale, MD;  [facility];  Service: Gastroenterology;  Laterality: N/A;  ? GIVENS CAPSULE STUDY N/A 11/18/2020  ? Procedure: GIVENS CAPSULE STUDY;  Surgeon: Harvel Quale, MD;  [facility];  Service: Gastroenterology;  Laterality: N/A;  7:30  ? JOINT REPLACEMENT    ? LEFT HEART CATH AND CORONARY ANGIOGRAPHY N/A 01/10/2019  ? Procedure: LEFT HEART CATH AND CORONARY ANGIOGRAPHY;  Surgeon: Belva Crome, MD;  Location: [facility];  Service: Cardiovascular;  Laterality: N/A;  ? POLYPECTOMY  11/05/2020  ? Procedure: POLYPECTOMY;  Surgeon: Harvel Quale, MD;  [facility];  Service: Gastroenterology;;  ? TONSILLECTOMY    ? TOTAL HIP ARTHROPLASTY Right 12/04/2015  ? TOTAL HIP ARTHROPLASTY Right 12/04/2015  ? Procedure: TOTAL HIP ARTHROPLASTY ANTERIOR APPROACH;  Surgeon: Marybelle Killings, MD;  Location: [facility];  Service: Orthopedics;  Laterality: Right;  ? TOTAL HIP ARTHROPLASTY Left 01/24/2016  ? Procedure: TOTAL HIP ARTHROPLASTY ANTERIOR APPROACH;  Surgeon: Marybelle Killings, MD;  Location: [facility];  Service: Orthopedics;  Laterality: Left;  anterior approach  ? TUBAL LIGATION    ? ? ?Social History:  reports that she has never smoked. She has never used smokeless tobacco. She reports current alcohol use. She reports that she does not currently use drugs. ? ? ?Allergies  ?Allergen Reactions  ? Shellfish Allergy Anaphylaxis  ? Ciprofloxacin Hives  ? Copaxone [Glatiramer Acetate] Hives  ? Penicillins Hives and Rash  ?  Bad headaches ?Did it involve swelling of the face/tongue/throat, SOB, or low BP? No ?Did it involve sudden or severe rash/hives, skin peeling, or any reaction on the inside of your mouth or nose? No ?Did you need to seek medical attention at a hospital or doctor's office? No ?When did it last happen?      6 - 7 years ?If all above answers are "NO", may proceed with cephalosporin use. ?  ? Vancomycin Itching  ? ? ?Family History  ?Problem Relation Age of Onset  ? Multiple sclerosis Mother   ? Arthritis/Rheumatoid Mother   ? Psoriasis Father   ? Atrial fibrillation Father   ? Hypertension Brother   ?  ? ?Prior to Admission medications    ?Medication Sig Start Date End Date Taking? Authorizing Provider  ?amLODipine (NORVASC) 2.5 MG tablet Take 1 tablet (2.5 mg total) by mouth daily. 02/27/21   Satira Sark, MD  ?aspirin 81 MG tablet Take 81 mg by mouth daily.    [provider]  ?BIOTIN PO Take 10,000 mcg by mouth daily.    [provider]  ?diazepam (VALIUM) 5 MG tablet TAKE ONE TABLET BY MOUTH THREE TIMES DAILY AS NEEDED 08/19/20   Marcial Pacas, MD  ?dicyclomine (BENTYL) 10 MG capsule Take 1 capsule (10 mg total) by mouth every 12 (twelve) hours as needed (abdominal pain). 11/05/20   Harvel Quale, MD  ?DULoxetine (CYMBALTA) 60 MG capsule Take 1 capsule (60 mg total) by mouth at bedtime. 05/21/15   Sater, Nanine Means, MD  ?furosemide (LASIX) 40 MG tablet Take 40 mg by mouth daily as needed for fluid or edema. 05/03/20   [provider]  ?  gabapentin (NEURONTIN) 600 MG tablet Take 1 tablet (600 mg total) by mouth 4 (four) times daily. 06/06/21   Sater, Nanine Means, MD  ?levothyroxine (SYNTHROID) 112 MCG tablet Take 112 mcg by mouth daily before breakfast.    [provider]  ?metFORMIN (GLUCOPHAGE) 500 MG tablet Take 1 tablet (500 mg total) by mouth 2 (two) times daily. 12/26/18   Satira Sark, MD  ?metoprolol tartrate (LOPRESSOR) 50 MG tablet Take 1 tablet (50 mg total) by mouth 2 (two) times daily. 05/26/21   Satira Sark, MD  ?mometasone (ELOCON) 0.1 % cream Apply 1 application topically daily as needed (psoriasis). 03/02/14   [provider]  ?nitroGLYCERIN (NITROSTAT) 0.4 MG SL tablet Place 1 tablet (0.4 mg total) under the tongue every 5 (five) minutes as needed. 01/10/19   Cheryln Manly, NP  ?pantoprazole (PROTONIX) 40 MG tablet Take 40 mg by mouth daily.     [provider]  ?rosuvastatin (CRESTOR) 5 MG tablet Take 5 mg by mouth every other day. 06/05/20   [provider]  ? ? ?Physical Exam: ?BP (!) 141/82 (BP Location: Left Arm)   Pulse 70   Temp 98.5 ?F  (36.9 ?C) (Oral)   Resp 15   Ht '5\' 2"'$  (1.575 m)   Wt 82.6 kg   SpO2 93%   BMI 33.29 kg/m?  ? ?General: 61 y.o. year-old obese female well developed well nourished in no acute distress.  Alert and oriented x3

## 2021-07-26 NOTE — ED Provider Notes (Signed)
?[facility] ?Provider Note ? ? ?CSN: [id number] ?Arrival date & time: 07/26/21  2156 ? ?  ? ?History ? ?Chief Complaint  ?Patient presents with  ? Chest Pain  ? ? ?BERGEN MELLE is a 61 y.o. female. ? ?Pt is a 61 yo with MS, hypothyroidism, depression, anxiety, gerd, RLS, DM2, htn, skin cancer, and CAD.  Pt had multivessel CAD status post DES to CTO RCA and mid LAD in September 2020.  Pt said she had cp this morning while at a meeting.  She said it went away, but then came back tonight while she was sitting watching tv.  Pt took 3 nitro and asa.  Cp improved, but it did not completely go away. ? ? ?  ? ?Home Medications ?Prior to Admission medications   ?Medication Sig Start Date End Date Taking? Authorizing Provider  ?amLODipine (NORVASC) 2.5 MG tablet Take 1 tablet (2.5 mg total) by mouth daily. 02/27/21   Satira Sark, MD  ?aspirin 81 MG tablet Take 81 mg by mouth daily.    [provider]  ?BIOTIN PO Take 10,000 mcg by mouth daily.    [provider]  ?diazepam (VALIUM) 5 MG tablet TAKE ONE TABLET BY MOUTH THREE TIMES DAILY AS NEEDED 08/19/20   Marcial Pacas, MD  ?dicyclomine (BENTYL) 10 MG capsule Take 1 capsule (10 mg total) by mouth every 12 (twelve) hours as needed (abdominal pain). 11/05/20   Harvel Quale, MD  ?DULoxetine (CYMBALTA) 60 MG capsule Take 1 capsule (60 mg total) by mouth at bedtime. 05/21/15   Sater, Nanine Means, MD  ?furosemide (LASIX) 40 MG tablet Take 40 mg by mouth daily as needed for fluid or edema. 05/03/20   [provider]  ?gabapentin (NEURONTIN) 600 MG tablet Take 1 tablet (600 mg total) by mouth 4 (four) times daily. 06/06/21   Sater, Nanine Means, MD  ?levothyroxine (SYNTHROID) 112 MCG tablet Take 112 mcg by mouth daily before breakfast.    [provider]  ?metFORMIN (GLUCOPHAGE) 500 MG tablet Take 1 tablet (500 mg total) by mouth 2 (two) times daily. 12/26/18   Satira Sark, MD  ?metoprolol tartrate (LOPRESSOR)  50 MG tablet Take 1 tablet (50 mg total) by mouth 2 (two) times daily. 05/26/21   Satira Sark, MD  ?mometasone (ELOCON) 0.1 % cream Apply 1 application topically daily as needed (psoriasis). 03/02/14   [provider]  ?nitroGLYCERIN (NITROSTAT) 0.4 MG SL tablet Place 1 tablet (0.4 mg total) under the tongue every 5 (five) minutes as needed. 01/10/19   Cheryln Manly, NP  ?pantoprazole (PROTONIX) 40 MG tablet Take 40 mg by mouth daily.     [provider]  ?rosuvastatin (CRESTOR) 5 MG tablet Take 5 mg by mouth every other day. 06/05/20   [provider]  ?   ? ?Allergies    ?Shellfish allergy, Ciprofloxacin, Copaxone [glatiramer acetate], Penicillins, and Vancomycin   ? ?Review of Systems   ?Review of Systems  ?Constitutional:  Positive for diaphoresis.  ?Cardiovascular:  Positive for chest pain.  ?All other systems reviewed and are negative. ? ?Physical Exam ?Updated Vital Signs ?BP 139/67   Pulse 72   Temp 98.6 ?F (37 ?C) (Oral)   Resp 15   Ht '5\' 2"'$  (1.575 m)   Wt 82.6 kg   SpO2 96%   BMI 33.29 kg/m?  ?Physical Exam ?Vitals and nursing note reviewed.  ?Constitutional:   ?   Appearance: She is well-developed. She  is obese.  ?HENT:  ?   Head: Normocephalic and atraumatic.  ?Eyes:  ?   Extraocular Movements: Extraocular movements intact.  ?   Pupils: Pupils are equal, round, and reactive to light.  ?Cardiovascular:  ?   Rate and Rhythm: Normal rate and regular rhythm.  ?   Heart sounds: Normal heart sounds.  ?Pulmonary:  ?   Effort: Pulmonary effort is normal.  ?   Breath sounds: Normal breath sounds.  ?Abdominal:  ?   General: Bowel sounds are normal.  ?   Palpations: Abdomen is soft.  ?Musculoskeletal:     ?   General: Normal range of motion.  ?   Cervical back: Normal range of motion and neck supple.  ?Skin: ?   General: Skin is warm.  ?   Capillary Refill: Capillary refill takes less than 2 seconds.  ?Neurological:  ?   General: No focal deficit present.  ?   Mental Status:  She is alert and oriented to person, place, and time.  ?Psychiatric:     ?   Mood and Affect: Mood normal.     ?   Behavior: Behavior normal.  ? ? ?ED Results / Procedures / Treatments   ?Labs ?(all labs ordered are listed, but only abnormal results are displayed) ?Labs Reviewed  ?BASIC METABOLIC PANEL - Abnormal; Notable for the following components:  ?    Result Value  ? Potassium 3.2 (*)   ? Glucose, Bld 114 (*)   ? All other components within normal limits  ?CBC - Abnormal; Notable for the following components:  ? Platelets 148 (*)   ? All other components within normal limits  ?MAGNESIUM  ?TROPONIN I (HIGH SENSITIVITY)  ? ? ?EKG ?None ? ?Radiology ?DG Chest 2 View ? ?Result Date: 07/26/2021 ?CLINICAL DATA:  Shortness of breath and chest pain. EXAM: CHEST - 2 VIEW COMPARISON:  PA Lat 06/16/2018. FINDINGS: Cardiac size is upper-normal. There is either heavy calcification or prior stenting of the LAD coronary artery. Stable mediastinum. Early atherosclerosis of the aorta. No vascular congestion is seen. The lungs hypoinflated but generally clear, with limited view of the bases and mild chronic elevation right hemidiaphragm. No pleural effusion is evident.  Intact thoracic cage. IMPRESSION: Hypoinflated study. No evidence of acute chest process with limited view of the bases. Electronically Signed   By: Telford Nab M.D.   On: 07/26/2021 22:28   ? ?Procedures ?Procedures  ? ? ?Medications Ordered in ED ?Medications  ?potassium chloride SA (KLOR-CON M) CR tablet 40 mEq (has no administration in time range)  ? ? ?ED Course/ Medical Decision Making/ A&P ?  ?                        ?Medical Decision Making ? ?This patient presents to the ED for concern of cp, this involves an extensive number of treatment options, and is a complaint that carries with it a high risk of complications and morbidity.  The differential diagnosis includes cardiac, pulmonary, gi ? ? ?Co morbidities that complicate the patient  evaluation ? ?MS, hypothyroidism, depression, anxiety, gerd, RLS, DM2, htn, skin cancer, and CAD ? ? ?Additional history obtained: ? ?Additional history obtained from epic chart review ?External records from outside source obtained and reviewed including EMS report, husband ? ? ?Lab Tests: ? ?I Ordered, and personally interpreted labs.  The pertinent results include:  cbc nl, bmp nl other than k 3.2, trop nl ? ? ?Imaging  Studies ordered: ? ?I ordered imaging studies including cxr  ?I independently visualized and interpreted imaging which showed  ?  ?IMPRESSION:  ?Hypoinflated study. No evidence of acute chest process with limited  ?view of the bases.  ?   ? ?I agree with the radiologist interpretation ? ? ?Cardiac Monitoring: ? ?The patient was maintained on a cardiac monitor.  I personally viewed and interpreted the cardiac monitored which showed an underlying rhythm of: nsr ? ? ?Medicines ordered and prescription drug management: ? ?I ordered medication including kcl  for hypokalemia  ?Reevaluation of the patient after these medicines showed that the patient improved ?I have reviewed the patients home medicines and have made adjustments as needed ? ? ?Consultations Obtained: ? ?I requested consultation with the hospitalist (Dr. Josephine Cables),  and discussed lab and imaging findings as well as pertinent plan - he will admit ? ? ?Problem List / ED Course: ? ?CP:  pt's EKG and 1st troponin nl.  However, pt has a heart score of 5, so I think she needs an obs stay to cycle troponins ? ? ?Reevaluation: ? ?After the interventions noted above, I reevaluated the patient and found that they have :improved ? ? ?Social Determinants of Health: ? ?Lives at home with husband ? ? ?Dispostion: ? ?After consideration of the diagnostic results and the patients response to treatment, I feel that the patent would benefit from admission.   ? ? ? ? ? ? ? ?Final Clinical Impression(s) / ED Diagnoses ?Final diagnoses:  ?Unstable angina ([place])   ? ? ?Rx / DC Orders ?ED Discharge Orders   ? ? None  ? ?  ? ? ?  ?Isla Pence, MD ?07/26/21 2322 ? ?

## 2021-07-27 DIAGNOSIS — E039 Hypothyroidism, unspecified: Secondary | ICD-10-CM

## 2021-07-27 DIAGNOSIS — K219 Gastro-esophageal reflux disease without esophagitis: Secondary | ICD-10-CM

## 2021-07-27 DIAGNOSIS — D696 Thrombocytopenia, unspecified: Secondary | ICD-10-CM

## 2021-07-27 DIAGNOSIS — I25110 Atherosclerotic heart disease of native coronary artery with unstable angina pectoris: Secondary | ICD-10-CM | POA: Diagnosis not present

## 2021-07-27 DIAGNOSIS — E876 Hypokalemia: Secondary | ICD-10-CM

## 2021-07-27 DIAGNOSIS — R079 Chest pain, unspecified: Secondary | ICD-10-CM | POA: Diagnosis not present

## 2021-07-27 LAB — GLUCOSE, CAPILLARY
Glucose-Capillary: 116 mg/dL — ABNORMAL HIGH (ref 70–99)
Glucose-Capillary: 131 mg/dL — ABNORMAL HIGH (ref 70–99)

## 2021-07-27 LAB — COMPREHENSIVE METABOLIC PANEL
ALT: 25 U/L (ref 0–44)
AST: 30 U/L (ref 15–41)
Albumin: 3.6 g/dL (ref 3.5–5.0)
Alkaline Phosphatase: 54 U/L (ref 38–126)
Anion gap: 8 (ref 5–15)
BUN: 8 mg/dL (ref 6–20)
CO2: 26 mmol/L (ref 22–32)
Calcium: 8.9 mg/dL (ref 8.9–10.3)
Chloride: 109 mmol/L (ref 98–111)
Creatinine, Ser: 0.88 mg/dL (ref 0.44–1.00)
GFR, Estimated: >60 mL/min (ref >60)
Glucose, Bld: 152 mg/dL — ABNORMAL HIGH (ref 70–99)
Potassium: 3.6 mmol/L (ref 3.5–5.1)
Sodium: 143 mmol/L (ref 135–145)
Total Bilirubin: 0.5 mg/dL (ref 0.3–1.2)
Total Protein: 6.5 g/dL (ref 6.5–8.1)

## 2021-07-27 LAB — CBC
HCT: 41.1 % (ref 36.0–46.0)
Hemoglobin: 13.6 g/dL (ref 12.0–15.0)
MCH: 28.6 pg (ref 26.0–34.0)
MCHC: 33.1 g/dL (ref 30.0–36.0)
MCV: 86.5 fL (ref 80.0–100.0)
Platelets: 131 10*3/uL — ABNORMAL LOW (ref 150–400)
RBC: 4.75 MIL/uL (ref 3.87–5.11)
RDW: 14 % (ref 11.5–15.5)
WBC: 4.8 10*3/uL (ref 4.0–10.5)
nRBC: 0 % (ref 0.0–0.2)

## 2021-07-27 LAB — MAGNESIUM: Magnesium: 1.9 mg/dL (ref 1.7–2.4)

## 2021-07-27 LAB — CBG MONITORING, ED
Glucose-Capillary: 143 mg/dL — ABNORMAL HIGH (ref 70–99)
Glucose-Capillary: 91 mg/dL (ref 70–99)

## 2021-07-27 LAB — HEMOGLOBIN A1C
Hgb A1c MFr Bld: 6.1 % — ABNORMAL HIGH (ref 4.8–5.6)
Mean Plasma Glucose: 128.37 mg/dL

## 2021-07-27 LAB — TROPONIN I (HIGH SENSITIVITY)
Troponin I (High Sensitivity): 3 ng/L (ref <18)
Troponin I (High Sensitivity): 2 ng/L (ref <18)
Troponin I (High Sensitivity): 3 ng/L (ref <18)

## 2021-07-27 LAB — APTT: aPTT: 28 seconds (ref 24–36)

## 2021-07-27 LAB — HIV ANTIBODY (ROUTINE TESTING W REFLEX): HIV Screen 4th Generation wRfx: NONREACTIVE

## 2021-07-27 LAB — PHOSPHORUS: Phosphorus: 4.5 mg/dL (ref 2.5–4.6)

## 2021-07-27 MED ORDER — DULOXETINE HCL 60 MG PO CPEP
60.0000 mg | ORAL_CAPSULE | Freq: Every day | ORAL | Status: DC
  Administered 2021-07-27: 60 mg via ORAL
  Filled 2021-07-27: qty 1

## 2021-07-27 MED ORDER — ROSUVASTATIN CALCIUM 10 MG PO TABS
5.0000 mg | ORAL_TABLET | ORAL | Status: DC
Start: 2021-07-27 — End: 2021-07-28
  Administered 2021-07-27: 5 mg via ORAL
  Filled 2021-07-27: qty 1

## 2021-07-27 MED ORDER — ASPIRIN EC 81 MG PO TBEC
81.0000 mg | DELAYED_RELEASE_TABLET | Freq: Every day | ORAL | Status: DC
  Administered 2021-07-27 – 2021-07-28 (×2): 81 mg via ORAL
  Filled 2021-07-27 (×2): qty 1

## 2021-07-27 MED ORDER — NITROGLYCERIN 0.4 MG SL SUBL
0.4000 mg | SUBLINGUAL_TABLET | SUBLINGUAL | Status: DC | PRN
  Filled 2021-07-27: qty 1

## 2021-07-27 MED ORDER — LEVOTHYROXINE SODIUM 112 MCG PO TABS
112.0000 ug | ORAL_TABLET | Freq: Every day | ORAL | Status: DC
  Administered 2021-07-27 – 2021-07-28 (×2): 112 ug via ORAL
  Filled 2021-07-27 (×2): qty 1

## 2021-07-27 MED ORDER — GABAPENTIN 300 MG PO CAPS
600.0000 mg | ORAL_CAPSULE | Freq: Four times a day (QID) | ORAL | Status: DC
Start: 2021-07-27 — End: 2021-07-28
  Administered 2021-07-27 – 2021-07-28 (×6): 600 mg via ORAL
  Filled 2021-07-27 (×6): qty 2

## 2021-07-27 MED ORDER — AMLODIPINE BESYLATE 5 MG PO TABS
2.5000 mg | ORAL_TABLET | Freq: Every day | ORAL | Status: DC
  Administered 2021-07-27 – 2021-07-28 (×2): 2.5 mg via ORAL
  Filled 2021-07-27 (×2): qty 1

## 2021-07-27 MED ORDER — PANTOPRAZOLE SODIUM 40 MG PO TBEC
40.0000 mg | DELAYED_RELEASE_TABLET | Freq: Every day | ORAL | Status: DC
  Administered 2021-07-27 – 2021-07-28 (×2): 40 mg via ORAL
  Filled 2021-07-27 (×2): qty 1

## 2021-07-27 MED ORDER — INSULIN ASPART 100 UNIT/ML IJ SOLN
0.0000 [IU] | Freq: Three times a day (TID) | INTRAMUSCULAR | Status: DC
  Administered 2021-07-27 – 2021-07-28 (×3): 2 [IU] via SUBCUTANEOUS
  Filled 2021-07-27: qty 1

## 2021-07-27 MED ORDER — INSULIN ASPART 100 UNIT/ML IJ SOLN: 0.0000 [IU] | Freq: Every day | INTRAMUSCULAR | Status: DC

## 2021-07-27 MED ORDER — METOPROLOL TARTRATE 50 MG PO TABS
50.0000 mg | ORAL_TABLET | Freq: Two times a day (BID) | ORAL | Status: DC
  Administered 2021-07-27 – 2021-07-28 (×4): 50 mg via ORAL
  Filled 2021-07-27 (×4): qty 1

## 2021-07-27 MED ORDER — NITROGLYCERIN 0.4 MG SL SUBL
SUBLINGUAL_TABLET | SUBLINGUAL | Status: AC
  Administered 2021-07-27: 0.4 mg
  Filled 2021-07-27: qty 1

## 2021-07-27 NOTE — Progress Notes (Signed)
?PROGRESS NOTE ? ? ? ?Mary Strong  MVH:[id number] DOB: [birth year] DOA: 07/26/2021 ?PCP: Caryl Bis, MD ? ? ?Brief Narrative:  ?Per HPI: ?Mary Strong is a 60 y.o. female with medical history significant for hypertension, hypothyroidism, type II DM, GERD, hyperlipidemia, CAD s/p DES to CTO RCA and mid LAD in September 2020 who presents to the emergency department due to chest pain which started this morning while at a meeting, chest pain was midsternal, nonreproducible, nonradiating and self resolved after about 40 minutes, it was rated as 3/10 on pain scale.  Chest pain returned in the evening around 8 PM while watching TV, chest pain was similar to the one in the morning, but worse with chest pain increasing to 4-5/10 on pain scale and with tingling sensation of left arm and extension of midsternal chest pain to left sternal breast area.  She took 3 nitro pills with 5-minute intervals with only minimal relief from the chest pain, patient also took 4 baby aspirin with slight improvement in chest pain.  EMS was activated and patient was taken to the ED for further evaluation and management.  She denies fever, chills, nausea, vomiting, abdominal pain, diarrhea or constipation. ? ?3/26: Patient has been admitted for chest pain evaluation.  She continues to have some intermittent episodes of chest pain that are relieved with nitroglycerin.  2D echocardiogram pending.  Patient will remain n.p.o. after midnight with cardiology evaluation in a.m. to consider stress testing as needed.  ? ? ?Assessment & Plan: ?  ?Principal Problem: ?  Chest pain ?Active Problems: ?  CAD (coronary artery disease) ?  Type 2 diabetes mellitus with complication, without long-term current use of insulin ([place]) ?  HTN (hypertension) ?  Hyperlipidemia, mixed ?  Thrombocytopenia ([place]) ?  Hypokalemia ?  Hypothyroidism ?  GERD (gastroesophageal reflux disease) ? ?Assessment and Plan: ? ? ?Chest pain rule out ACS in the setting of CAD  with prior stent placement ?Continue telemetry  ?Troponins x2 was flat at 3 ?EKG with no significant findings of ACS ?Patient's chest pain remains intermittent at rest and there is concern of unstable angina ?2D echocardiogram ordered and pending ?[place] cardiology consultation in a.m. and will remain n.p.o. after midnight to consider stress test ?Continue aspirin, nitroglycerin prn ? ? ?Thrombocytopenia ?Continue to monitor platelet levels ?  ?Essential hypertension ?Continue amlodipine, Lopressor ? ?Hypothyroidism ?Continue Synthroid ? ?Type II DM ?Continue ISS and hypoglycemic protocol ? ?GERD ?Continue Protonix ? ?Hyperlipidemia ?Continue Crestor ? ? ?DVT prophylaxis: Lovenox ?Code Status: Full ?Family Communication: Husband at bedside ?Disposition Plan:  ?Status is: Observation ?The patient will require care spanning > 2 midnights and should be moved to inpatient because: Evaluation of chest pain with possible need for stress test in a.m. ? ?Consultants:  ?Cardiology ? ?Procedures:  ?See below ? ?Antimicrobials:  ?None ? ? ?Subjective: ?Patient seen and evaluated today with no new acute complaints or concerns this morning.  She did have some recurrent chest pain later in the day and received nitroglycerin with relief.  She states that her chest pain is slightly different now and seems to radiate into her left shoulder.  ?Objective: ?Vitals:  ? 07/27/21 0930 07/27/21 1000 07/27/21 1200 07/27/21 1500  ?BP: 140/77 135/77 (!) 148/94 (!) 167/90  ?Pulse: 67 63 62 64  ?Resp: '11 11 17   '$ ?Temp:    98.2 ?F (36.8 ?C)  ?TempSrc:    Oral  ?SpO2: 100% 99% 97% 100%  ?Weight:      ?  Height:      ? ?No intake or output data in the 24 hours ending 07/27/21 1540 ?Filed Weights  ? 07/26/21 2206  ?Weight: 82.6 kg  ? ? ?Examination: ? ?General exam: Appears calm and comfortable  ?Respiratory system: Clear to auscultation. Respiratory effort normal. ?Cardiovascular system: S1 & S2 heard, RRR.  ?Gastrointestinal system: Abdomen is  soft ?Central nervous system: Alert and awake ?Extremities: No edema ?Skin: No significant lesions noted ?Psychiatry: Flat affect. ? ? ? ?Data Reviewed: I have personally reviewed following labs and imaging studies ? ?CBC: ?Recent Labs  ?Lab 07/26/21 ?2204 07/27/21 ?6962  ?WBC 5.9 4.8  ?HGB 13.3 13.6  ?HCT 39.4 41.1  ?MCV 88.3 86.5  ?PLT 148* 131*  ? ?Basic Metabolic Panel: ?Recent Labs  ?Lab 07/26/21 ?2204 07/27/21 ?9528  ?NA 140 143  ?K 3.2* 3.6  ?CL 107 109  ?CO2 24 26  ?GLUCOSE 114* 152*  ?BUN 10 8  ?CREATININE 0.88 0.88  ?CALCIUM 8.9 8.9  ?MG 1.8 1.9  ?PHOS  --  4.5  ? ?GFR: ?Estimated Creatinine Clearance: 68.6 mL/min (by C-G formula based on SCr of 0.88 mg/dL). ?Liver Function Tests: ?Recent Labs  ?Lab 07/27/21 ?4132  ?AST 30  ?ALT 25  ?ALKPHOS 54  ?BILITOT 0.5  ?PROT 6.5  ?ALBUMIN 3.6  ? ?No results for input(s): LIPASE, AMYLASE in the last 168 hours. ?No results for input(s): AMMONIA in the last 168 hours. ?Coagulation Profile: ?No results for input(s): INR, PROTIME in the last 168 hours. ?Cardiac Enzymes: ?No results for input(s): CKTOTAL, CKMB, CKMBINDEX, TROPONINI in the last 168 hours. ?BNP (last 3 results) ?No results for input(s): PROBNP in the last 8760 hours. ?HbA1C: ?Recent Labs  ?  07/27/21 ?0055  ?HGBA1C 6.1*  ? ?CBG: ?Recent Labs  ?Lab 07/27/21 ?0939 07/27/21 ?1316  ?GLUCAP 143* 91  ? ?Lipid Profile: ?No results for input(s): CHOL, HDL, LDLCALC, TRIG, CHOLHDL, LDLDIRECT in the last 72 hours. ?Thyroid Function Tests: ?No results for input(s): TSH, T4TOTAL, FREET4, T3FREE, THYROIDAB in the last 72 hours. ?Anemia Panel: ?No results for input(s): VITAMINB12, FOLATE, FERRITIN, TIBC, IRON, RETICCTPCT in the last 72 hours. ?Sepsis Labs: ?No results for input(s): PROCALCITON, LATICACIDVEN in the last 168 hours. ? ?No results found for this or any previous visit (from the past 240 hour(s)).  ? ? ? ? ? ?Radiology Studies: ?DG Chest 2 View ? ?Result Date: 07/26/2021 ?CLINICAL DATA:  Shortness of breath and  chest pain. EXAM: CHEST - 2 VIEW COMPARISON:  PA Lat 06/16/2018. FINDINGS: Cardiac size is upper-normal. There is either heavy calcification or prior stenting of the LAD coronary artery. Stable mediastinum. Early atherosclerosis of the aorta. No vascular congestion is seen. The lungs hypoinflated but generally clear, with limited view of the bases and mild chronic elevation right hemidiaphragm. No pleural effusion is evident.  Intact thoracic cage. IMPRESSION: Hypoinflated study. No evidence of acute chest process with limited view of the bases. Electronically Signed   By: Telford Nab M.D.   On: 07/26/2021 22:28   ? ? ? ? ? ?Scheduled Meds: ? amLODipine  2.5 mg Oral Daily  ? aspirin EC  81 mg Oral Daily  ? DULoxetine  60 mg Oral QHS  ? enoxaparin (LOVENOX) injection  40 mg Subcutaneous Q24H  ? gabapentin  600 mg Oral QID  ? insulin aspart  0-15 Units Subcutaneous TID WC  ? insulin aspart  0-5 Units Subcutaneous QHS  ? levothyroxine  112 mcg Oral Q0600  ? metoprolol tartrate  50 mg Oral BID  ? pantoprazole  40 mg Oral Daily  ? rosuvastatin  5 mg Oral QODAY  ? ? ? LOS: 0 days  ? ? ?Time spent: 35 minutes ? ? ? ?[place], DO ?[facility] ? ?If 7PM-7AM, please contact night-coverage ?[email or web address] ?07/27/2021, 3:40 PM  ? ?

## 2021-07-27 NOTE — Hospital Course (Signed)
Per HPI: ?Mary Strong is a 61 y.o. female with medical history significant for hypertension, hypothyroidism, type II DM, GERD, hyperlipidemia, CAD s/p DES to CTO RCA and mid LAD in September 2020 who presents to the emergency department due to chest pain which started this morning while at a meeting, chest pain was midsternal, nonreproducible, nonradiating and self resolved after about 40 minutes, it was rated as 3/10 on pain scale.  Chest pain returned in the evening around 8 PM while watching TV, chest pain was similar to the one in the morning, but worse with chest pain increasing to 4-5/10 on pain scale and with tingling sensation of left arm and extension of midsternal chest pain to left sternal breast area.  She took 3 nitro pills with 5-minute intervals with only minimal relief from the chest pain, patient also took 4 baby aspirin with slight improvement in chest pain.  EMS was activated and patient was taken to the ED for further evaluation and management.  She denies fever, chills, nausea, vomiting, abdominal pain, diarrhea or constipation. ? ?3/26: Patient has been admitted for chest pain evaluation.  She continues to have some intermittent episodes of chest pain that are relieved with nitroglycerin.  2D echocardiogram pending.  Patient will remain n.p.o. after midnight with cardiology evaluation in a.m. to consider stress testing as needed. ?

## 2021-07-27 NOTE — ED Notes (Signed)
Patient having chest pain, gave patient nitro ? ?

## 2021-07-28 ENCOUNTER — Observation Stay (HOSPITAL_BASED_OUTPATIENT_CLINIC_OR_DEPARTMENT_OTHER): Payer: BC Managed Care – PPO

## 2021-07-28 ENCOUNTER — Encounter (HOSPITAL_COMMUNITY): Payer: Self-pay | Admitting: Internal Medicine

## 2021-07-28 ENCOUNTER — Other Ambulatory Visit (HOSPITAL_COMMUNITY): Payer: Self-pay | Admitting: *Deleted

## 2021-07-28 DIAGNOSIS — I25110 Atherosclerotic heart disease of native coronary artery with unstable angina pectoris: Secondary | ICD-10-CM | POA: Diagnosis not present

## 2021-07-28 DIAGNOSIS — R079 Chest pain, unspecified: Secondary | ICD-10-CM | POA: Diagnosis not present

## 2021-07-28 DIAGNOSIS — E119 Type 2 diabetes mellitus without complications: Secondary | ICD-10-CM

## 2021-07-28 DIAGNOSIS — E785 Hyperlipidemia, unspecified: Secondary | ICD-10-CM

## 2021-07-28 LAB — CBC
HCT: 40.7 % (ref 36.0–46.0)
Hemoglobin: 13.3 g/dL (ref 12.0–15.0)
MCH: 28.5 pg (ref 26.0–34.0)
MCHC: 32.7 g/dL (ref 30.0–36.0)
MCV: 87.2 fL (ref 80.0–100.0)
Platelets: 127 10*3/uL — ABNORMAL LOW (ref 150–400)
RBC: 4.67 MIL/uL (ref 3.87–5.11)
RDW: 14.1 % (ref 11.5–15.5)
WBC: 4.9 10*3/uL (ref 4.0–10.5)
nRBC: 0 % (ref 0.0–0.2)

## 2021-07-28 LAB — ECHOCARDIOGRAM COMPLETE
AR max vel: 2.04 cm2
AV Area VTI: 2 cm2
AV Area mean vel: 2.04 cm2
AV Mean grad: 2 mmHg
AV Peak grad: 4.2 mmHg
Ao pk vel: 1.02 m/s
Area-P 1/2: 2.62 cm2
Calc EF: 69.1 %
Height: 62 in
MV VTI: 1.3 cm2
S' Lateral: 2.8 cm
Single Plane A2C EF: 71.9 %
Single Plane A4C EF: 65.3 %
Weight: 2912 oz

## 2021-07-28 LAB — GLUCOSE, CAPILLARY
Glucose-Capillary: 139 mg/dL — ABNORMAL HIGH (ref 70–99)
Glucose-Capillary: 125 mg/dL — ABNORMAL HIGH (ref 70–99)
Glucose-Capillary: 131 mg/dL — ABNORMAL HIGH (ref 70–99)

## 2021-07-28 LAB — NM MYOCAR MULTI W/SPECT W/WALL MOTION / EF
Base ST Depression (mm): 0 mm
LV dias vol: 65 mL (ref 46–106)
LV sys vol: 29 mL
Nuc Stress EF: 56 %
RATE: 0.3
Rest Nuclear Isotope Dose: 10.5 mCi
SDS: 3
SRS: 1
SSS: 4
ST Depression (mm): 0 mm
Stress Nuclear Isotope Dose: 32 mCi
TID: 1.29

## 2021-07-28 MED ORDER — REGADENOSON 0.4 MG/5ML IV SOLN
INTRAVENOUS | Status: AC
  Filled 2021-07-28: qty 5

## 2021-07-28 MED ORDER — SODIUM CHLORIDE FLUSH 0.9 % IV SOLN
INTRAVENOUS | Status: AC
  Administered 2021-07-28: 10 mL via INTRAVENOUS
  Filled 2021-07-28: qty 10

## 2021-07-28 MED ORDER — TECHNETIUM TC 99M TETROFOSMIN IV KIT
30.0000 | PACK | Freq: Once | INTRAVENOUS | Status: AC | PRN
  Administered 2021-07-28: 32 via INTRAVENOUS

## 2021-07-28 MED ORDER — TECHNETIUM TC 99M TETROFOSMIN IV KIT
10.0000 | PACK | Freq: Once | INTRAVENOUS | Status: AC | PRN
  Administered 2021-07-28: 10.5 via INTRAVENOUS

## 2021-07-28 MED ORDER — REGADENOSON 0.4 MG/5ML IV SOLN
0.4000 mg | Freq: Once | INTRAVENOUS | Status: AC
  Administered 2021-07-28: 0.4 mg via INTRAVENOUS
  Filled 2021-07-28: qty 5

## 2021-07-28 NOTE — Plan of Care (Signed)
?  Problem: Education: ?Goal: Knowledge of General Education information will improve ?Description: Including pain rating scale, medication(s)/side effects and non-pharmacologic comfort measures ?Outcome: Progressing ?  ?Problem: Clinical Measurements: ?Goal: Ability to maintain clinical measurements within normal limits will improve ?Outcome: Progressing ?  ?Problem: Clinical Measurements: ?Goal: Cardiovascular complication will be avoided ?Outcome: Progressing ?  ?Problem: Activity: ?Goal: Risk for activity intolerance will decrease ?Outcome: Progressing ?  ?Problem: Nutrition: ?Goal: Adequate nutrition will be maintained ?Outcome: Progressing ?  ?Problem: Coping: ?Goal: Level of anxiety will decrease ?Outcome: Progressing ?  ?Problem: Pain Managment: ?Goal: General experience of comfort will improve ?Outcome: Progressing ?  ?Problem: Safety: ?Goal: Ability to remain free from injury will improve ?Outcome: Progressing ?  ?

## 2021-07-28 NOTE — Discharge Summary (Signed)
Physician Discharge Summary  ?Mary Strong PZW:[id number] DOB: [birth year] DOA: 07/26/2021 ? ?PCP: Caryl Bis, MD ? ?Admit date: 07/26/2021 ? ?Discharge date: 07/28/2021 ? ?Admitted From:Home ? ?Disposition:  Home ? ?Recommendations for Outpatient Follow-up:  ?Follow up with PCP in 1-2 weeks ?Follow up with Cardiology and schedule appointment ?Continue home medications as prior ? ?Home Health:None ? ?Equipment/Devices:None ? ?Discharge Condition:Stable ? ?CODE STATUS: Full ? ?Diet recommendation: Heart Healthy/Carb modified ? ?Brief/Interim Summary: ?Per HPI: ?Strong Mary is a 61 y.o. female with medical history significant for hypertension, hypothyroidism, type II DM, GERD, hyperlipidemia, CAD s/p DES to CTO RCA and mid LAD in September 2020 who presents to the emergency department due to chest pain which started this morning while at a meeting, chest pain was midsternal, nonreproducible, nonradiating and self resolved after about 40 minutes, it was rated as 3/10 on pain scale.  Chest pain returned in the evening around 8 PM while watching TV, chest pain was similar to the one in the morning, but worse with chest pain increasing to 4-5/10 on pain scale and with tingling sensation of left arm and extension of midsternal chest pain to left sternal breast area.  She took 3 nitro pills with 5-minute intervals with only minimal relief from the chest pain, patient also took 4 baby aspirin with slight improvement in chest pain.  EMS was activated and patient was taken to the ED for further evaluation and management.  She denies fever, chills, nausea, vomiting, abdominal pain, diarrhea or constipation. ?  ?3/26: Patient has been admitted for chest pain evaluation.  She continues to have some intermittent episodes of chest pain that are relieved with nitroglycerin.  2D echocardiogram pending.  Patient will remain n.p.o. after midnight with cardiology evaluation in a.m. to consider stress testing as needed.   ? ?3/27: Stress testing has returned low risk and patient seen by cardiology. She is stable for discharge on usual medications with outpatient follow up planned. ? ?Discharge Diagnoses:  ?Principal Problem: ?  Chest pain ?Active Problems: ?  CAD (coronary artery disease) ?  Type 2 diabetes mellitus with complication, without long-term current use of insulin ([place]) ?  HTN (hypertension) ?  Hyperlipidemia, mixed ?  Thrombocytopenia (Mary Strong) ?  Hypokalemia ?  Hypothyroidism ?  GERD (gastroesophageal reflux disease) ? ?Principal discharge diagnosis: Chest pain with no sign of ACS. ? ?Discharge Instructions ? ?Discharge Instructions   ? ? Diet - low sodium heart healthy   Complete by: As directed ?  ? Increase activity slowly   Complete by: As directed ?  ? ?  ? ?Allergies as of 07/28/2021   ? ?   Reactions  ? Shellfish Allergy Anaphylaxis  ? Ciprofloxacin Hives  ? Copaxone [glatiramer Acetate] Hives  ? Penicillins Hives, Rash  ? Bad headaches ?Did it involve swelling of the face/tongue/throat, SOB, or low BP? No ?Did it involve sudden or severe rash/hives, skin peeling, or any reaction on the inside of your mouth or nose? No ?Did you need to seek medical attention at a hospital or doctor's office? No ?When did it last happen?      6 - 7 years ?If all above answers are "NO", may proceed with cephalosporin use.  ? Vancomycin Itching  ? ?  ? ?  ?Medication List  ?  ? ?TAKE these medications   ? ?amLODipine 2.5 MG tablet ?Commonly known as: NORVASC ?Take 1 tablet (2.5 mg total) by mouth daily. ?  ?aspirin 81 MG tablet ?Take  81 mg by mouth daily. ?  ?BIOTIN PO ?Take 10,000 mcg by mouth daily. ?  ?diazepam 5 MG tablet ?Commonly known as: VALIUM ?TAKE ONE TABLET BY MOUTH THREE TIMES DAILY AS NEEDED ?  ?dicyclomine 10 MG capsule ?Commonly known as: Bentyl ?Take 1 capsule (10 mg total) by mouth every 12 (twelve) hours as needed (abdominal pain). ?  ?DULoxetine 60 MG capsule ?Commonly known as: CYMBALTA ?Take 1 capsule (60 mg total)  by mouth at bedtime. ?  ?furosemide 40 MG tablet ?Commonly known as: LASIX ?Take 40 mg by mouth daily as needed for fluid or edema. ?  ?gabapentin 600 MG tablet ?Commonly known as: NEURONTIN ?Take 1 tablet (600 mg total) by mouth 4 (four) times daily. ?  ?levothyroxine 112 MCG tablet ?Commonly known as: SYNTHROID ?Take 112 mcg by mouth daily before breakfast. ?  ?metFORMIN 500 MG tablet ?Commonly known as: GLUCOPHAGE ?Take 1 tablet (500 mg total) by mouth 2 (two) times daily. ?  ?metFORMIN 500 MG 24 hr tablet ?Commonly known as: GLUCOPHAGE-XR ?Take 1,000 mg by mouth 2 (two) times daily. ?  ?metoprolol tartrate 50 MG tablet ?Commonly known as: LOPRESSOR ?Take 1 tablet (50 mg total) by mouth 2 (two) times daily. ?  ?mometasone 0.1 % cream ?Commonly known as: ELOCON ?Apply 1 application topically daily as needed (psoriasis). ?  ?nitroGLYCERIN 0.4 MG SL tablet ?Commonly known as: Nitrostat ?Place 1 tablet (0.4 mg total) under the tongue every 5 (five) minutes as needed. ?  ?pantoprazole 40 MG tablet ?Commonly known as: PROTONIX ?Take 40 mg by mouth daily. ?  ?rosuvastatin 5 MG tablet ?Commonly known as: CRESTOR ?Take 5 mg by mouth every other day. ?  ? ?  ? ? Follow-up Information   ? ? Caryl Bis, MD. Schedule an appointment as soon as possible for a visit in 1 week(s).   ?Specialty: Family Medicine ?Contact information: ?[address] ?[place] [address] ?[phone] ? ? ?  ?  ? ? Satira Sark, MD. Schedule an appointment as soon as possible for a visit.   ?Specialty: Cardiology ?Contact information: ?[address] ?[address] ?[place] [address] ?[phone] ? ? ?  ?  ? ?  ?  ? ?  ? ?Allergies  ?Allergen Reactions  ? Shellfish Allergy Anaphylaxis  ? Ciprofloxacin Hives  ? Copaxone [Glatiramer Acetate] Hives  ? Penicillins Hives and Rash  ?  Bad headaches ?Did it involve swelling of the face/tongue/throat, SOB, or low BP? No ?Did it involve sudden or severe rash/hives, skin peeling, or any reaction on the inside  of your mouth or nose? No ?Did you need to seek medical attention at a hospital or doctor's office? No ?When did it last happen?      6 - 7 years ?If all above answers are "NO", may proceed with cephalosporin use. ?  ? Vancomycin Itching  ? ? ?Consultations: ?Cardiology ? ? ?Procedures/Studies: ?DG Chest 2 View ? ?Result Date: 07/26/2021 ?CLINICAL DATA:  Shortness of breath and chest pain. EXAM: CHEST - 2 VIEW COMPARISON:  PA Lat 06/16/2018. FINDINGS: Cardiac size is upper-normal. There is either heavy calcification or prior stenting of the LAD coronary artery. Stable mediastinum. Early atherosclerosis of the aorta. No vascular congestion is seen. The lungs hypoinflated but generally clear, with limited view of the bases and mild chronic elevation right hemidiaphragm. No pleural effusion is evident.  Intact thoracic cage. IMPRESSION: Hypoinflated study. No evidence of acute chest process with limited view of the  bases. Electronically Signed   By: Telford Nab M.D.   On: 07/26/2021 22:28  ? ?NM Myocar Multi W/Spect W/Wall Motion / EF ? ?Result Date: 07/28/2021 ?  The study is normal. The study is low risk.   No ST deviation was noted.   LV perfusion is normal. There is no evidence of ischemia. There is no evidence of infarction. Small apical mild defect compatable with breast attenuation.   Left ventricular function is normal. End diastolic cavity size is normal. End systolic cavity size is normal.   ? ? ?Discharge Exam: ?Vitals:  ? 07/28/21 0853 07/28/21 1415  ?BP: 137/90 127/83  ?Pulse: 69 80  ?Resp:  20  ?Temp:  98.3 ?F (36.8 ?C)  ?SpO2:  98%  ? ?Vitals:  ? 07/27/21 2031 07/28/21 0531 07/28/21 0853 07/28/21 1415  ?BP: (!) 145/83 119/63 137/90 127/83  ?Pulse: 68 66 69 80  ?Resp: '17 16  20  '$ ?Temp: 98.1 ?F (36.7 ?C) 97.7 ?F (36.5 ?C)  98.3 ?F (36.8 ?C)  ?TempSrc: Oral Oral  Oral  ?SpO2: 100% 99%  98%  ?Weight:      ?Height:      ? ? ?General: Pt is alert, awake, not in acute distress ?Cardiovascular: RRR, S1/S2 +, no  rubs, no gallops ?Respiratory: CTA bilaterally, no wheezing, no rhonchi ?Abdominal: Soft, NT, ND, bowel sounds + ?Extremities: no edema, no cyanosis ? ? ? ?The results of significant diagnostics from this h

## 2021-07-28 NOTE — TOC Progression Note (Signed)
?  Transition of Care (TOC) Screening Note ? ? ?Patient Details  ?Name: Mary Strong ?Date of Birth: June 23, 1960 ? ? ?Transition of Care (TOC) CM/SW Contact:    ?Shade Flood, LCSW ?Phone Number: ?07/28/2021, 11:02 AM ? ? ? ?Transition of Care Department [facility]) has reviewed patient and no TOC needs have been identified at this time. We will continue to monitor patient advancement through interdisciplinary progression rounds. If new patient transition needs arise, please place a TOC consult. ? ? ?

## 2021-07-28 NOTE — Progress Notes (Signed)
Patient discharged home today, transported by husband home. Discharge paperwork went over with patient, patient verbalized understanding. Belongings sent home with patient. ?

## 2021-07-28 NOTE — Consult Note (Addendum)
?Cardiology Consultation:  ? ?Patient ID: Mary Strong ?MRN: [id number]; DOB: [birth year] ? ?Admit date: 07/26/2021 ?Date of Consult: 07/28/2021 ? ?PCP:  Caryl Bis, MD ?  ?[facility] Prov[facility] ?Cardiologist:  Rozann Lesches, MD      ? ? ?Patient Profile:  ? ?Mary Strong is a 61 y.o. female with a hx of CAD, HTN, DM2, HLD who is being seen 07/28/2021 for the evaluation of chest pain at the request of Dr. Manuella Ghazi. ? ?History of Present Illness:  ? ?[name] is a 61 y.o. with history of CAD  S/P DES CTO RCA and mLAD 01/2019 NST 01/2020 low risk small resion mid basal inflat ischemia, OV with Dr. Domenic Polite with chest pain 02/2021 norvasc added. Hasn't tolerated long acting nitrates in the past. ? ?Patient admitted with chest pressure while in a meeting that lasted about 40 min, non-radiation. Pain eased spontaneously.  Pain returned 8pm while watching TV, minimal relief from first NTG worse after second and some relief after 3rd . Slight improvement with 4 baby ASA. NSR with RBBB unchanged. Chest pain yest am relieved with 1 NTG. Previous angina was exertional when HR would get up and she developed chest pressure relieved with belching. Has a lot of GI problems-gastroparesis, terrible reflux and IBS. Exercise limited due to MS. She says norvasc improved her chest pain and reflux 02/2021. ? ? ?Past Medical History:  ?Diagnosis Date  ? Anxiety   ? Arthritis   ? CAD (coronary artery disease)   ? Heavily calcified vessels, DES to CTO RCA and DES to mid LAD September 2020  ? Constipation   ? Depression   ? Essential hypertension   ? Fatty liver   ? GERD (gastroesophageal reflux disease)   ? History of migraine   ? History of sleep apnea   ? Currently not using CPAP  ? Hypothyroidism   ? Multiple sclerosis ([place])   ? Neurogenic bladder   ? Neuropathy   ? Palpitations   ? Restless leg syndrome   ? Skin cancer   ? Basal cell cancer on forehead, strong family hx of adenocarcinoma  ? Type 2 diabetes mellitus ([place])    ? ? ?Past Surgical History:  ?Procedure Laterality Date  ? BIOPSY  11/05/2020  ? Procedure: BIOPSY;  Surgeon: Montez Morita, Quillian Quince, MD;  [facility];  Service: Gastroenterology;;  ? CESAREAN SECTION    ? x 2  ? CHOLECYSTECTOMY    ? COLONOSCOPY WITH PROPOFOL N/A 11/05/2020  ? Procedure: COLONOSCOPY WITH PROPOFOL;  Surgeon: Harvel Quale, MD;  [facility];  Service: Gastroenterology;  Laterality: N/A;  12:30  ? CORONARY ATHERECTOMY N/A 01/18/2019  ? Procedure: CORONARY ATHERECTOMY;  Surgeon: Martinique, Peter M, MD;  Location: [facility];  Service: Cardiovascular;  Laterality: N/A;  ? CORONARY CTO INTERVENTION N/A 01/18/2019  ? Procedure: CORONARY CTO INTERVENTION;  Surgeon: Martinique, Peter M, MD;  Location: [facility];  Service: Cardiovascular;  Laterality: N/A;  ? CORONARY STENT INTERVENTION N/A 01/18/2019  ? Procedure: CORONARY STENT INTERVENTION;  Surgeon: Martinique, Peter M, MD;  Location: [facility];  Service: Cardiovascular;  Laterality: N/A;  ? ENDOMETRIAL ABLATION    ? ESOPHAGOGASTRODUODENOSCOPY (EGD) WITH PROPOFOL N/A 11/05/2020  ? Procedure: ESOPHAGOGASTRODUODENOSCOPY (EGD) WITH PROPOFOL;  Surgeon: Harvel Quale, MD;  [facility];  Service: Gastroenterology;  Laterality: N/A;  ? GIVENS CAPSULE STUDY N/A 11/18/2020  ? Procedure: GIVENS CAPSULE STUDY;  Surgeon: Harvel Quale,  MD;  [facility];  Service: Gastroenterology;  Laterality: N/A;  7:30  ? JOINT REPLACEMENT    ? LEFT HEART CATH AND CORONARY ANGIOGRAPHY N/A 01/10/2019  ? Procedure: LEFT HEART CATH AND CORONARY ANGIOGRAPHY;  Surgeon: Belva Crome, MD;  Location: [facility];  Service: Cardiovascular;  Laterality: N/A;  ? POLYPECTOMY  11/05/2020  ? Procedure: POLYPECTOMY;  Surgeon: Harvel Quale, MD;  [facility];  Service: Gastroenterology;;  ? TONSILLECTOMY    ? TOTAL HIP ARTHROPLASTY Right 12/04/2015  ? TOTAL HIP ARTHROPLASTY  Right 12/04/2015  ? Procedure: TOTAL HIP ARTHROPLASTY ANTERIOR APPROACH;  Surgeon: Marybelle Killings, MD;  Location: [facility];  Service: Orthopedics;  Laterality: Right;  ? TOTAL HIP ARTHROPLASTY Left 01/24/2016  ? Procedure: TOTAL HIP ARTHROPLASTY ANTERIOR APPROACH;  Surgeon: Marybelle Killings, MD;  Location: [facility];  Service: Orthopedics;  Laterality: Left;  anterior approach  ? TUBAL LIGATION    ?  ? ?Home Medications:  ?Prior to Admission medications   ?Medication Sig Start Date End Date Taking? Authorizing Provider  ?amLODipine (NORVASC) 2.5 MG tablet Take 1 tablet (2.5 mg total) by mouth daily. 02/27/21  Yes Satira Sark, MD  ?aspirin 81 MG tablet Take 81 mg by mouth daily.   Yes [provider]  ?BIOTIN PO Take 10,000 mcg by mouth daily.   Yes [provider]  ?diazepam (VALIUM) 5 MG tablet TAKE ONE TABLET BY MOUTH THREE TIMES DAILY AS NEEDED 08/19/20  Yes Marcial Pacas, MD  ?dicyclomine (BENTYL) 10 MG capsule Take 1 capsule (10 mg total) by mouth every 12 (twelve) hours as needed (abdominal pain). 11/05/20  Yes Harvel Quale, MD  ?DULoxetine (CYMBALTA) 60 MG capsule Take 1 capsule (60 mg total) by mouth at bedtime. 05/21/15  Yes Sater, Nanine Means, MD  ?furosemide (LASIX) 40 MG tablet Take 40 mg by mouth daily as needed for fluid or edema. 05/03/20  Yes [provider]  ?gabapentin (NEURONTIN) 600 MG tablet Take 1 tablet (600 mg total) by mouth 4 (four) times daily. 06/06/21  Yes Sater, Nanine Means, MD  ?levothyroxine (SYNTHROID) 112 MCG tablet Take 112 mcg by mouth daily before breakfast.   Yes [provider]  ?metFORMIN (GLUCOPHAGE) 500 MG tablet Take 1 tablet (500 mg total) by mouth 2 (two) times daily. 12/26/18  Yes Satira Sark, MD  ?metoprolol tartrate (LOPRESSOR) 50 MG tablet Take 1 tablet (50 mg total) by mouth 2 (two) times daily. 05/26/21  Yes Satira Sark, MD  ?mometasone (ELOCON) 0.1 % cream Apply 1 application topically daily as needed (psoriasis). 03/02/14   Yes [provider]  ?nitroGLYCERIN (NITROSTAT) 0.4 MG SL tablet Place 1 tablet (0.4 mg total) under the tongue every 5 (five) minutes as needed. 01/10/19  Yes Reino Bellis B, NP  ?pantoprazole (PROTONIX) 40 MG tablet Take 40 mg by mouth daily.    Yes [provider]  ?rosuvastatin (CRESTOR) 5 MG tablet Take 5 mg by mouth every other day. 06/05/20  Yes [provider]  ?metFORMIN (GLUCOPHAGE-XR) 500 MG 24 hr tablet Take 1,000 mg by mouth 2 (two) times daily. 06/24/21   [provider]  ? ? ?Inpatient Medications: ?Scheduled Meds: ? amLODipine  2.5 mg Oral Daily  ? aspirin EC  81 mg Oral Daily  ? DULoxetine  60 mg Oral QHS  ? enoxaparin (LOVENOX) injection  40 mg Subcutaneous Q24H  ? gabapentin  600 mg Oral QID  ? insulin aspart  0-15 Units Subcutaneous TID WC  ? insulin aspart  0-5 Units Subcutaneous QHS  ? levothyroxine  112 mcg Oral Q0600  ? metoprolol tartrate  50 mg Oral BID  ? pantoprazole  40 mg Oral Daily  ? regadenoson  0.4 mg Intravenous Once  ? rosuvastatin  5 mg Oral QODAY  ? ?Continuous Infusions: ? ?PRN Meds: ?nitroGLYCERIN ? ?Allergies:    ?Allergies  ?Allergen Reactions  ? Shellfish Allergy Anaphylaxis  ? Ciprofloxacin Hives  ? Copaxone [Glatiramer Acetate] Hives  ? Penicillins Hives and Rash  ?  Bad headaches ?Did it involve swelling of the face/tongue/throat, SOB, or low BP? No ?Did it involve sudden or severe rash/hives, skin peeling, or any reaction on the inside of your mouth or nose? No ?Did you need to seek medical attention at a hospital or doctor's office? No ?When did it last happen?      6 - 7 years ?If all above answers are "NO", may proceed with cephalosporin use. ?  ? Vancomycin Itching  ? ? ?Social History:   ?Social History  ? ?Socioeconomic History  ? Marital status: Married  ?  Spouse name: Not on file  ? Number of children: Not on file  ? Years of education: Not on file  ? Highest education level: Not on file  ?Occupational History  ? Not on file   ?Tobacco Use  ? Smoking status: Never  ? Smokeless tobacco: Never  ?Vaping Use  ? Vaping Use: Never used  ?Substance and Sexual Activity  ? Alcohol use: Yes  ?  Alcohol/week: 0.0 standard drinks  ?  Comment:

## 2021-07-28 NOTE — Progress Notes (Signed)
*  PRELIMINARY RESULTS* ?Echocardiogram ?2D Echocardiogram has been performed. ? ?Mary Strong ?07/28/2021, 3:05 PM ?

## 2021-08-01 DIAGNOSIS — E1142 Type 2 diabetes mellitus with diabetic polyneuropathy: Secondary | ICD-10-CM | POA: Diagnosis not present

## 2021-08-01 DIAGNOSIS — I10 Essential (primary) hypertension: Secondary | ICD-10-CM | POA: Diagnosis not present

## 2021-08-18 ENCOUNTER — Other Ambulatory Visit: Payer: Self-pay | Admitting: Cardiology

## 2021-08-31 DIAGNOSIS — E1142 Type 2 diabetes mellitus with diabetic polyneuropathy: Secondary | ICD-10-CM | POA: Diagnosis not present

## 2021-08-31 DIAGNOSIS — I10 Essential (primary) hypertension: Secondary | ICD-10-CM | POA: Diagnosis not present

## 2021-08-31 DIAGNOSIS — G35 Multiple sclerosis: Secondary | ICD-10-CM | POA: Diagnosis not present

## 2021-08-31 DIAGNOSIS — Z7984 Long term (current) use of oral hypoglycemic drugs: Secondary | ICD-10-CM | POA: Diagnosis not present

## 2021-09-22 ENCOUNTER — Ambulatory Visit: Payer: Medicare Other | Admitting: Neurology

## 2021-09-22 DIAGNOSIS — Z1231 Encounter for screening mammogram for malignant neoplasm of breast: Secondary | ICD-10-CM | POA: Diagnosis not present

## 2021-09-26 ENCOUNTER — Inpatient Hospital Stay (HOSPITAL_COMMUNITY): Payer: BC Managed Care – PPO

## 2021-09-26 ENCOUNTER — Other Ambulatory Visit (HOSPITAL_COMMUNITY): Payer: BC Managed Care – PPO

## 2021-09-26 ENCOUNTER — Encounter (HOSPITAL_COMMUNITY): Payer: Self-pay

## 2021-10-01 DIAGNOSIS — I10 Essential (primary) hypertension: Secondary | ICD-10-CM | POA: Diagnosis not present

## 2021-10-01 DIAGNOSIS — E1142 Type 2 diabetes mellitus with diabetic polyneuropathy: Secondary | ICD-10-CM | POA: Diagnosis not present

## 2021-10-01 DIAGNOSIS — Z7984 Long term (current) use of oral hypoglycemic drugs: Secondary | ICD-10-CM | POA: Diagnosis not present

## 2021-10-01 DIAGNOSIS — G35 Multiple sclerosis: Secondary | ICD-10-CM | POA: Diagnosis not present

## 2021-10-03 ENCOUNTER — Telehealth (HOSPITAL_COMMUNITY): Payer: BC Managed Care – PPO | Admitting: Physician Assistant

## 2021-10-10 DIAGNOSIS — Z794 Long term (current) use of insulin: Secondary | ICD-10-CM | POA: Diagnosis not present

## 2021-10-10 DIAGNOSIS — E039 Hypothyroidism, unspecified: Secondary | ICD-10-CM | POA: Diagnosis not present

## 2021-10-10 DIAGNOSIS — E782 Mixed hyperlipidemia: Secondary | ICD-10-CM | POA: Diagnosis not present

## 2021-10-10 DIAGNOSIS — E7800 Pure hypercholesterolemia, unspecified: Secondary | ICD-10-CM | POA: Diagnosis not present

## 2021-10-10 DIAGNOSIS — E1165 Type 2 diabetes mellitus with hyperglycemia: Secondary | ICD-10-CM | POA: Diagnosis not present

## 2021-10-10 DIAGNOSIS — E8881 Metabolic syndrome: Secondary | ICD-10-CM | POA: Diagnosis not present

## 2021-10-13 DIAGNOSIS — Z1389 Encounter for screening for other disorder: Secondary | ICD-10-CM | POA: Diagnosis not present

## 2021-10-13 DIAGNOSIS — Z23 Encounter for immunization: Secondary | ICD-10-CM | POA: Diagnosis not present

## 2021-10-13 DIAGNOSIS — G35 Multiple sclerosis: Secondary | ICD-10-CM | POA: Diagnosis not present

## 2021-10-13 DIAGNOSIS — E7849 Other hyperlipidemia: Secondary | ICD-10-CM | POA: Diagnosis not present

## 2021-10-13 DIAGNOSIS — E039 Hypothyroidism, unspecified: Secondary | ICD-10-CM | POA: Diagnosis not present

## 2021-10-13 DIAGNOSIS — Z0001 Encounter for general adult medical examination with abnormal findings: Secondary | ICD-10-CM | POA: Diagnosis not present

## 2021-10-13 DIAGNOSIS — F331 Major depressive disorder, recurrent, moderate: Secondary | ICD-10-CM | POA: Diagnosis not present

## 2021-10-13 DIAGNOSIS — Z1331 Encounter for screening for depression: Secondary | ICD-10-CM | POA: Diagnosis not present

## 2021-10-21 DIAGNOSIS — E875 Hyperkalemia: Secondary | ICD-10-CM | POA: Diagnosis not present

## 2021-10-31 DIAGNOSIS — Z7984 Long term (current) use of oral hypoglycemic drugs: Secondary | ICD-10-CM | POA: Diagnosis not present

## 2021-10-31 DIAGNOSIS — G35 Multiple sclerosis: Secondary | ICD-10-CM | POA: Diagnosis not present

## 2021-10-31 DIAGNOSIS — E1142 Type 2 diabetes mellitus with diabetic polyneuropathy: Secondary | ICD-10-CM | POA: Diagnosis not present

## 2021-12-01 DIAGNOSIS — G35 Multiple sclerosis: Secondary | ICD-10-CM | POA: Diagnosis not present

## 2021-12-01 DIAGNOSIS — Z7984 Long term (current) use of oral hypoglycemic drugs: Secondary | ICD-10-CM | POA: Diagnosis not present

## 2021-12-01 DIAGNOSIS — E1142 Type 2 diabetes mellitus with diabetic polyneuropathy: Secondary | ICD-10-CM | POA: Diagnosis not present

## 2021-12-01 DIAGNOSIS — I10 Essential (primary) hypertension: Secondary | ICD-10-CM | POA: Diagnosis not present

## 2021-12-29 ENCOUNTER — Encounter (INDEPENDENT_AMBULATORY_CARE_PROVIDER_SITE_OTHER): Payer: Self-pay | Admitting: Gastroenterology

## 2022-01-01 DIAGNOSIS — I10 Essential (primary) hypertension: Secondary | ICD-10-CM | POA: Diagnosis not present

## 2022-01-01 DIAGNOSIS — E782 Mixed hyperlipidemia: Secondary | ICD-10-CM | POA: Diagnosis not present

## 2022-01-08 DIAGNOSIS — I10 Essential (primary) hypertension: Secondary | ICD-10-CM | POA: Diagnosis not present

## 2022-01-08 DIAGNOSIS — D529 Folate deficiency anemia, unspecified: Secondary | ICD-10-CM | POA: Diagnosis not present

## 2022-01-08 DIAGNOSIS — E1165 Type 2 diabetes mellitus with hyperglycemia: Secondary | ICD-10-CM | POA: Diagnosis not present

## 2022-01-08 DIAGNOSIS — E782 Mixed hyperlipidemia: Secondary | ICD-10-CM | POA: Diagnosis not present

## 2022-01-08 DIAGNOSIS — E7849 Other hyperlipidemia: Secondary | ICD-10-CM | POA: Diagnosis not present

## 2022-01-08 DIAGNOSIS — K2100 Gastro-esophageal reflux disease with esophagitis, without bleeding: Secondary | ICD-10-CM | POA: Diagnosis not present

## 2022-01-08 DIAGNOSIS — D649 Anemia, unspecified: Secondary | ICD-10-CM | POA: Diagnosis not present

## 2022-01-08 DIAGNOSIS — D519 Vitamin B12 deficiency anemia, unspecified: Secondary | ICD-10-CM | POA: Diagnosis not present

## 2022-01-15 ENCOUNTER — Encounter: Payer: Self-pay | Admitting: Physician Assistant

## 2022-01-15 ENCOUNTER — Inpatient Hospital Stay: Payer: BC Managed Care – PPO | Attending: Physician Assistant | Admitting: Physician Assistant

## 2022-01-15 DIAGNOSIS — K30 Functional dyspepsia: Secondary | ICD-10-CM

## 2022-01-15 DIAGNOSIS — D509 Iron deficiency anemia, unspecified: Secondary | ICD-10-CM

## 2022-01-15 DIAGNOSIS — E538 Deficiency of other specified B group vitamins: Secondary | ICD-10-CM

## 2022-01-15 NOTE — Progress Notes (Signed)
Virtual Visit via Telephone Note [facility]  I connected with Mary Strong  on 01/15/2022 at 3:54 PM by telephone and verified that I am speaking with the correct person using two identifiers.  Location: Patient: Home Provider: [facility]   I discussed the limitations, risks, security and privacy concerns of performing an evaluation and management service by telephone and the availability of in person appointments. I also discussed with the patient that there may be a patient responsible charge related to this service. The patient expressed understanding and agreed to proceed.  REASON FOR VISIT:  Follow-up for iron deficiency anemia   PRIOR THERAPY: Oral iron tablet (unable to tolerate)   CURRENT THERAPY: Intermittent IV iron (Injectafer x2 on 11/01/2020 and 11/08/2020)   INTERVAL HISTORY:  Ms. Mary Strong.o. female is contacted today for routine follow-up of iron deficiency anemia, suspected to be secondary to malabsorption.  She was last evaluated by Tarri Abernethy PA-C via telemedicine visit on 05/30/2021.  Most recent IV iron repletion was with Injectafer 750 mg on 11/01/2020 and 11/08/2020.   Overall, she is doing well.   She has intermittent fatigue that varies day-to-day related to her MS.  She has not noted any bleeding such as epistaxis, hematemesis, hematochezia, or melena.  She denies any pica.   She does have occasional restless leg symptoms and headaches.  She denies any lightheadedness without syncope.  She has intermittent "chest pain" that she attributes to indigestion, has been worked up by cardiology in the past.  She denies any dyspnea on exertion.  She reports intermittent stomach pain, followed by Dr. Jenetta Downer (gastroenterology).  Patient reports that she has difficulty tolerating oral vitamin supplements, as they cause significant indigestion and upset stomach.  She also reports that for the past year she has been unable to tolerate meat  and some dairy products, reports that she feels that she gets full easily.  Of note, she did have a tick bite 2 years ago.  She has 50% energy today and 25% appetite.  She endorses that she is maintaining a stable weight.    OBSERVATIONS/OBJECTIVE: Review of Systems  Constitutional:  Positive for malaise/fatigue. Negative for chills, diaphoresis, fever and weight loss.  Respiratory:  Positive for cough. Negative for shortness of breath.   Cardiovascular:  Positive for chest pain ("Indigestion") and palpitations.  Gastrointestinal:  Positive for abdominal pain and constipation. Negative for blood in stool, melena, nausea and vomiting.  Neurological:  Positive for tingling. Negative for dizziness and headaches.     PHYSICAL EXAM (per limitations of virtual telephone visit): The patient is alert and oriented x 3, exhibiting adequate mentation, good mood, and ability to speak in full sentences and execute sound judgement.   ASSESSMENT & PLAN: 1.  Iron deficiency anemia - Etiology suspected to be malabsorption in the setting of long-term PPI use - Extensive GI work-up in July 2022 (EGD, colonoscopy, capsule study) did not reveal any source of blood loss - She is unable to tolerate oral iron supplements due to side effects - Received IV iron repletion with Injectafer x2 on 11/01/2020 and 11/08/2020 - No obvious bleeding such as bright red blood per rectum, melena, or epistaxis     - Most recent labs (01/08/2022): Hgb 13.6/MCV 88, ferritin 143 with 21% iron saturation.  Normal B12 250.  Folic acid low at 2.8. - PLAN: No indication for IV iron at this time.   - Recommend starting OTC folic acid supplement for 400  mcg daily if tolerated - We will repeat CBC, folate, B12, MMA and iron panel in 6 months, followed by office visit.    2.  Indigestion - Follows with Dr. Delton Coombes for her gastroenterological complaints - Patient reports that she has difficulty tolerating oral vitamin supplements, as they  cause significant indigestion and upset stomach. - She also reports that for the past year she has been unable to tolerate meat and some dairy products, reports that she feels that she gets full easily.  Of note, she did have a tick bite 2 years ago. - PLAN: Discussed with patient that we may want to check alpha-gal panel.  She would like to discuss this with her PCP first.   3.  Other history - She is a retired Marine scientist, previously worked at [employer] and [employer] - [place] is also positive for MS   FOLLOW UP INSTRUCTIONS: Labs in 6 months Office visit 1 week after labs    I discussed the assessment and treatment plan with the patient. The patient was provided an opportunity to ask questions and all were answered. The patient agreed with the plan and demonstrated an understanding of the instructions.   The patient was advised to call back or seek an in-person evaluation if the symptoms worsen or if the condition fails to improve as anticipated.  I provided 18 minutes of non-face-to-face time during this encounter.   Harriett Rush, PA-C 01/15/2022 7:57 PM

## 2022-01-20 DIAGNOSIS — H16223 Keratoconjunctivitis sicca, not specified as Sjogren's, bilateral: Secondary | ICD-10-CM | POA: Diagnosis not present

## 2022-01-20 DIAGNOSIS — E113293 Type 2 diabetes mellitus with mild nonproliferative diabetic retinopathy without macular edema, bilateral: Secondary | ICD-10-CM | POA: Diagnosis not present

## 2022-01-21 DIAGNOSIS — Z23 Encounter for immunization: Secondary | ICD-10-CM | POA: Diagnosis not present

## 2022-01-21 DIAGNOSIS — F331 Major depressive disorder, recurrent, moderate: Secondary | ICD-10-CM | POA: Diagnosis not present

## 2022-01-21 DIAGNOSIS — E782 Mixed hyperlipidemia: Secondary | ICD-10-CM | POA: Diagnosis not present

## 2022-01-21 DIAGNOSIS — K2100 Gastro-esophageal reflux disease with esophagitis, without bleeding: Secondary | ICD-10-CM | POA: Diagnosis not present

## 2022-01-21 DIAGNOSIS — R4582 Worries: Secondary | ICD-10-CM | POA: Diagnosis not present

## 2022-01-21 DIAGNOSIS — E039 Hypothyroidism, unspecified: Secondary | ICD-10-CM | POA: Diagnosis not present

## 2022-01-21 DIAGNOSIS — G35 Multiple sclerosis: Secondary | ICD-10-CM | POA: Diagnosis not present

## 2022-01-21 DIAGNOSIS — R197 Diarrhea, unspecified: Secondary | ICD-10-CM | POA: Diagnosis not present

## 2022-01-21 DIAGNOSIS — Z6834 Body mass index (BMI) 34.0-34.9, adult: Secondary | ICD-10-CM | POA: Diagnosis not present

## 2022-01-21 DIAGNOSIS — K7581 Nonalcoholic steatohepatitis (NASH): Secondary | ICD-10-CM | POA: Diagnosis not present

## 2022-01-21 DIAGNOSIS — E7849 Other hyperlipidemia: Secondary | ICD-10-CM | POA: Diagnosis not present

## 2022-01-28 DIAGNOSIS — Z23 Encounter for immunization: Secondary | ICD-10-CM | POA: Diagnosis not present

## 2022-02-13 ENCOUNTER — Other Ambulatory Visit: Payer: Self-pay | Admitting: Cardiology

## 2022-04-29 NOTE — Progress Notes (Signed)
Cardiology Office Note:    Date:  05/11/2022   ID:  Mary Strong, DOB 1960-10-20, MRN [id number]  PCP:  Caryl Bis, MD  [facility] Cardiologist:  Rozann Lesches, MD     Referring MD: Caryl Bis, MD   Chief Complaint:  Follow-up     History of Present Illness:   Mary Strong is a 61 y.o. female with history of CAD S/P DES CTO RCA and mLAD 01/2019 NST 01/2020 low risk small resion mid basal inflat ischemia, OV with Dr. Domenic Polite with chest pain 02/2021 norvasc added. Hasn't tolerated long acting nitrates in the past. Also has HTN, HLD, DM2.  Hospitalized 07/2021 with chest pain felt to be GI. Troponins negative, EKG unchanged, lexiscan normal, echo normal LVEF G1DD.  Patient comes in for yearly f/u. She had 2 family deaths over the holidays they are dealing with. Denies chest pain, dyspnea, edema. Occasional skip in her heart but not frequent. No NTG use. Doesn't exercise. Had labs last week at PCP. Still had a lot of GI problems but can identify it better now.      Past Medical History:  Diagnosis Date   Anxiety    Arthritis    CAD (coronary artery disease)    Heavily calcified vessels, DES to CTO RCA and DES to mid LAD September 2020   Constipation    Depression    Essential hypertension    Fatty liver    GERD (gastroesophageal reflux disease)    History of migraine    History of sleep apnea    Currently not using CPAP   Hypothyroidism    Multiple sclerosis (HCC)    Neurogenic bladder    Neuropathy    Palpitations    Restless leg syndrome    Skin cancer    Basal cell cancer on forehead, strong family hx of adenocarcinoma   Type 2 diabetes mellitus (HCC)    Current Medications: Current Meds  Medication Sig   amLODipine (NORVASC) 2.5 MG tablet Take 1 tablet (2.5 mg total) by mouth daily. Please attended scheduled appointment for additional refills.   aspirin 81 MG tablet Take 81 mg by mouth daily.   BIOTIN PO Take 10,000 mcg by  mouth daily.   CLEARLAX 17 GM/SCOOP powder Take 17 g by mouth daily.   diazepam (VALIUM) 5 MG tablet TAKE ONE TABLET BY MOUTH THREE TIMES DAILY AS NEEDED   dicyclomine (BENTYL) 10 MG capsule Take 1 capsule (10 mg total) by mouth every 12 (twelve) hours as needed (abdominal pain).   DULoxetine (CYMBALTA) 60 MG capsule Take 1 capsule (60 mg total) by mouth at bedtime.   gabapentin (NEURONTIN) 600 MG tablet Take 1 tablet (600 mg total) by mouth 4 (four) times daily.   levothyroxine (SYNTHROID) 112 MCG tablet Take 112 mcg by mouth daily before breakfast.   metFORMIN (GLUCOPHAGE-XR) 500 MG 24 hr tablet Take 1,000 mg by mouth 2 (two) times daily.   metoprolol tartrate (LOPRESSOR) 50 MG tablet Take 1 tablet (50 mg total) by mouth 2 (two) times daily.   mometasone (ELOCON) 0.1 % cream Apply 1 application topically daily as needed (psoriasis).   nitroGLYCERIN (NITROSTAT) 0.4 MG SL tablet Place 1 tablet (0.4 mg total) under the tongue every 5 (five) minutes as needed.   pantoprazole (PROTONIX) 40 MG tablet Take 40 mg by mouth daily.    rosuvastatin (CRESTOR) 5 MG tablet Take 5 mg by mouth every other day.    Allergies:  Shellfish allergy, Ciprofloxacin, Copaxone [glatiramer acetate], Penicillins, and Vancomycin   Social History   Tobacco Use   Smoking status: Never   Smokeless tobacco: Never  Vaping Use   Vaping Use: Never used  Substance Use Topics   Alcohol use: Not Currently    Comment: "none since 2014"   Drug use: Not Currently    Comment: none in 6 minths    Family Hx: The patient's family history includes Arthritis/Rheumatoid in her mother; Atrial fibrillation in her father; Hypertension in her brother; Multiple sclerosis in her mother; Psoriasis in her father.  ROS     Physical Exam:    VS:  BP 136/82   Pulse 73   Ht '5\' 2"'$  (1.575 m)   Wt 191 lb 6.4 oz (86.8 kg)   SpO2 98%   BMI 35.01 kg/m     Wt Readings from Last 3 Encounters:  05/11/22 191 lb 6.4 oz (86.8 kg)   07/26/21 182 lb (82.6 kg)  06/06/21 188 lb (85.3 kg)    Physical Exam  GEN: Well nourished, well developed, in no acute distress  HEENT: normal  Neck: no JVD, carotid bruits, or masses Cardiac:RRR; no murmurs, rubs, or gallops  Respiratory:  clear to auscultation bilaterally, normal work of breathing GI: soft, nontender, nondistended, + BS Ext: without cyanosis, clubbing, or edema, Good distal pulses bilaterally MS: no deformity or atrophy  Skin: warm and dry, no rash Neuro:  Alert and Oriented x 3, Strength and sensation are intact Psych: euthymic mood, full affect        EKGs/Labs/Other Test Reviewed:    EKG:  EKG is  not ordered today.    Recent Labs: 07/27/2021: ALT 25; BUN 8; Creatinine, Ser 0.88; Magnesium 1.9; Potassium 3.6; Sodium 143 07/28/2021: Hemoglobin 13.3; Platelets 127   Recent Lipid Panel No results for input(s): "CHOL", "TRIG", "HDL", "VLDL", "LDLCALC", "LDLDIRECT" in the last 8760 hours.   Prior CV Studies:   NST 07/2021   The study is normal. The study is low risk.   No ST deviation was noted.   LV perfusion is normal. There is no evidence of ischemia. There is no evidence of infarction. Small apical mild defect compatable with breast attenuation.   Left ventricular function is normal. End diastolic cavity size is normal. End systolic cavity size is normal.  Echo 07/2021 IMPRESSIONS     1. Left ventricular ejection fraction, by estimation, is 65 to 70%. The  left ventricle has normal function. The left ventricle has no regional  wall motion abnormalities. Left ventricular diastolic parameters are  consistent with Grade I diastolic  dysfunction (impaired relaxation).   2. Right ventricular systolic function is normal. The right ventricular  size is normal.   3. The mitral valve is normal in structure. No evidence of mitral valve  regurgitation. No evidence of mitral stenosis.   4. The aortic valve is normal in structure. Aortic valve regurgitation  is  not visualized. No aortic stenosis is present.   5. The inferior vena cava is normal in size with greater than 50%  respiratory variability, suggesting right atrial pressure of 3 mmHg.   FINDINGS   Left Ventricle: Left ventricular ejection fraction, by estimation, is 65  to 70%. The left ventricle has normal function. The left ventricle has no  regional wall motion abnormalities. The left ventricular internal cavity  size was normal in size. There is   no left ventricular hypertrophy. Left ventricular diastolic parameters  are consistent with Grade I diastolic  dysfunction (impaired relaxation).     Risk Assessment/Calculations/Metrics:              ASSESSMENT & PLAN:   No problem-specific Assessment & Plan notes found for this encounter.   CAD S/P DES CTO RCA and mLAD 01/2019 NST 07/2021-no angina. Continue ASA, amlodipine, metoprolol, rosuvastatin. 150 min exercise weekly.   HTN-BP well controlled  DM2-per PCP  HLD on rosuvastatin every other day. Has itching all over, hot and flushed if she takes it daily.Labs done last week by PCP-LDL 68           Dispo:  No follow-ups on file.   Medication Adjustments/Labs and Tests Ordered: Current medicines are reviewed at length with the patient today.  Concerns regarding medicines are outlined above.  Tests Ordered: No orders of the defined types were placed in this encounter.  Medication Changes: No orders of the defined types were placed in this encounter.  Sumner Boast, PA-C  05/11/2022 12:18 PM    [address], [place], [place]  [address] Phone: [phone]; Fax: [phone]

## 2022-05-07 DIAGNOSIS — I10 Essential (primary) hypertension: Secondary | ICD-10-CM | POA: Diagnosis not present

## 2022-05-07 DIAGNOSIS — E1165 Type 2 diabetes mellitus with hyperglycemia: Secondary | ICD-10-CM | POA: Diagnosis not present

## 2022-05-07 DIAGNOSIS — G35 Multiple sclerosis: Secondary | ICD-10-CM | POA: Diagnosis not present

## 2022-05-07 DIAGNOSIS — E7801 Familial hypercholesterolemia: Secondary | ICD-10-CM | POA: Diagnosis not present

## 2022-05-07 DIAGNOSIS — E7849 Other hyperlipidemia: Secondary | ICD-10-CM | POA: Diagnosis not present

## 2022-05-07 DIAGNOSIS — E782 Mixed hyperlipidemia: Secondary | ICD-10-CM | POA: Diagnosis not present

## 2022-05-07 DIAGNOSIS — K2100 Gastro-esophageal reflux disease with esophagitis, without bleeding: Secondary | ICD-10-CM | POA: Diagnosis not present

## 2022-05-07 DIAGNOSIS — Z0001 Encounter for general adult medical examination with abnormal findings: Secondary | ICD-10-CM | POA: Diagnosis not present

## 2022-05-07 DIAGNOSIS — E039 Hypothyroidism, unspecified: Secondary | ICD-10-CM | POA: Diagnosis not present

## 2022-05-07 DIAGNOSIS — E7800 Pure hypercholesterolemia, unspecified: Secondary | ICD-10-CM | POA: Diagnosis not present

## 2022-05-11 ENCOUNTER — Ambulatory Visit: Payer: BC Managed Care – PPO | Attending: Physician Assistant | Admitting: Physician Assistant

## 2022-05-11 ENCOUNTER — Encounter: Payer: Self-pay | Admitting: Physician Assistant

## 2022-05-11 VITALS — BP 136/82 | HR 73 | Ht 62.0 in | Wt 191.4 lb

## 2022-05-11 DIAGNOSIS — E782 Mixed hyperlipidemia: Secondary | ICD-10-CM

## 2022-05-11 DIAGNOSIS — I10 Essential (primary) hypertension: Secondary | ICD-10-CM

## 2022-05-11 DIAGNOSIS — I25119 Atherosclerotic heart disease of native coronary artery with unspecified angina pectoris: Secondary | ICD-10-CM | POA: Diagnosis not present

## 2022-05-11 DIAGNOSIS — E118 Type 2 diabetes mellitus with unspecified complications: Secondary | ICD-10-CM | POA: Diagnosis not present

## 2022-05-11 NOTE — Patient Instructions (Signed)
Medication Instructions:  Your physician recommends that you continue on your current medications as directed. Please refer to the Current Medication list given to you today.  Your provider would like you to complete 150 minutes of exercise weekly   *If you need a refill on your cardiac medications before your next appointment, please call your pharmacy*   Lab Work: NONE   If you have labs (blood work) drawn today and your tests are completely normal, you will receive your results only by: [place] (if you have MyChart) OR A paper copy in the mail If you have any lab test that is abnormal or we need to change your treatment, we will call you to review the results.   Testing/Procedures: NONE    Follow-Up: At [facility], you and your health needs are our priority.  As part of our continuing mission to provide you with exceptional heart care, we have created designated Provider Care Teams.  These Care Teams include your primary Cardiologist (physician) and Advanced Practice Providers (APPs -  Physician Assistants and Nurse Practitioners) who all work together to provide you with the care you need, when you need it.  We recommend signing up for the patient portal called "MyChart".  Sign up information is provided on this After Visit Summary.  MyChart is used to connect with patients for Virtual Visits (Telemedicine).  Patients are able to view lab/test results, encounter notes, upcoming appointments, etc.  Non-urgent messages can be sent to your provider as well.   To learn more about what you can do with MyChart, go to [email or web address].    Your next appointment:   1 year(s)  The format for your next appointment:   In Person  Provider:   Rozann Lesches, MD    Other Instructions Thank you for choosing [facility]!    Important Information About Sugar

## 2022-05-12 DIAGNOSIS — R197 Diarrhea, unspecified: Secondary | ICD-10-CM | POA: Diagnosis not present

## 2022-05-12 DIAGNOSIS — F331 Major depressive disorder, recurrent, moderate: Secondary | ICD-10-CM | POA: Diagnosis not present

## 2022-05-12 DIAGNOSIS — E782 Mixed hyperlipidemia: Secondary | ICD-10-CM | POA: Diagnosis not present

## 2022-05-12 DIAGNOSIS — Z6834 Body mass index (BMI) 34.0-34.9, adult: Secondary | ICD-10-CM | POA: Diagnosis not present

## 2022-05-12 DIAGNOSIS — I25119 Atherosclerotic heart disease of native coronary artery with unspecified angina pectoris: Secondary | ICD-10-CM | POA: Diagnosis not present

## 2022-05-12 DIAGNOSIS — K2100 Gastro-esophageal reflux disease with esophagitis, without bleeding: Secondary | ICD-10-CM | POA: Diagnosis not present

## 2022-05-12 DIAGNOSIS — R030 Elevated blood-pressure reading, without diagnosis of hypertension: Secondary | ICD-10-CM | POA: Diagnosis not present

## 2022-05-12 DIAGNOSIS — G35 Multiple sclerosis: Secondary | ICD-10-CM | POA: Diagnosis not present

## 2022-05-12 DIAGNOSIS — K7581 Nonalcoholic steatohepatitis (NASH): Secondary | ICD-10-CM | POA: Diagnosis not present

## 2022-05-12 DIAGNOSIS — E039 Hypothyroidism, unspecified: Secondary | ICD-10-CM | POA: Diagnosis not present

## 2022-05-12 DIAGNOSIS — E7849 Other hyperlipidemia: Secondary | ICD-10-CM | POA: Diagnosis not present

## 2022-05-13 ENCOUNTER — Other Ambulatory Visit: Payer: Self-pay | Admitting: Cardiology

## 2022-05-14 ENCOUNTER — Other Ambulatory Visit: Payer: Self-pay | Admitting: Cardiology

## 2022-06-11 ENCOUNTER — Encounter: Payer: Self-pay | Admitting: Neurology

## 2022-06-11 ENCOUNTER — Ambulatory Visit (INDEPENDENT_AMBULATORY_CARE_PROVIDER_SITE_OTHER): Payer: BC Managed Care – PPO | Admitting: Neurology

## 2022-06-11 VITALS — BP 148/90 | HR 68 | Ht 62.0 in | Wt 197.8 lb

## 2022-06-11 DIAGNOSIS — R208 Other disturbances of skin sensation: Secondary | ICD-10-CM

## 2022-06-11 DIAGNOSIS — G5620 Lesion of ulnar nerve, unspecified upper limb: Secondary | ICD-10-CM

## 2022-06-11 DIAGNOSIS — R413 Other amnesia: Secondary | ICD-10-CM

## 2022-06-11 DIAGNOSIS — G35 Multiple sclerosis: Secondary | ICD-10-CM

## 2022-06-11 MED ORDER — MODAFINIL 200 MG PO TABS
200.0000 mg | ORAL_TABLET | Freq: Every day | ORAL | 5 refills | Status: DC
Start: 2022-06-11 — End: 2022-07-23

## 2022-06-11 NOTE — Progress Notes (Signed)
[facility]  PATIENT: Mary Strong DOB: [birth year]  REFERRING CLINICIAN: Gar Ponto  HISTORY FROM: patient REASON FOR VISIT: MS and pain   HISTORICAL  CHIEF COMPLAINT:  Chief Complaint  Patient presents with   Follow-up    Pt here in room 11 here for follow up MS. Patient reports MS is stable, no new problems or concerns. Patient concerned about her memory, short and long term memory. Reports thoughts are slow, pt said she has to remind herself to do things has noticed decline over the years.     HISTORY OF PRESENT ILLNESS:  Mary Strong is a 62 y.o. woman with MS and related symptoms.    Update 06/11/2022: Her MS has been stable off of a DMT since she stopped Tysabri in 2017.       She is walking ok sometimes tripping or veering to the left.  She has no fall since last year when she fell ad broke her coccyx. .  She has left > right leg spasticity and is on valium.    Sometimes her stride is shorter than other days. Arms are strong but she has intermittent arm and hand numbness.      She also has allodynia in her trunk, worse with wearing bras ad some clothes.  She does worse in heat.     She is on gabapentin 600 mg po tid -- some days when pain is worse she will go up to qid.  Lamotrigine was not well tolerated.  She was once on nortriptyline for depression and tolerated it (this was before she had MS pain).     She has some urinary hesitancy/retention and urine sometimes shows WBC and she has had a few rounds of antibiotics but some cultures have been negative by her report.   She felt tamsulosin helped the bladder mildly but she stopped due to being on so many pills.  She has rare incontinence.  Constipation resolved woth med changes last year.    She had anemia but no source of bleeding.   She has poor absorption of iron so needed infusions.  She     MS History:   In 2005, she presented with least gait, muscle spasms and pain. SHe was found  to have a transverse myelitis. Initially, she was placed on Avonex. Due to worsening symptoms, she was switched to Tysabri around 2010 or 2011 and had 52 doses. However, she had converted to JCV antibody positive status and her titer rose over the past couple of determinations. Because of that, the Tysabri was discontinued as she had a higher risk of PML and she was started on Tecfidera .  She felt much better on Tysabri and went back on in 2016.   MRI of the brain just shows 2 nonspecific for intense foci an MRI of the cervical spine shows subtle foci at C6 and possibly C4.  She stopped due to elevated JCV Index.   She stopped all DMTs in 2017.     Her mother has MS and RA  Imaging: MRI of the head 10/18/2019 shows scattered T2/FLAIR hyperintense foci in the periventricular and subcortical white matter.  Most of these are nonspecific.  There were no changes compared to 04/09/2018  MRI of the cervical spine 10/16/2019 shows a T2 hyperintense focus at C6-C7 unchanged compared to the 04/16/2018 MRI.  There is moderate spinal stenosis at C5-C6 and mild spinal stenosis at C4-C5 and C6-C7.  MRI of the thoracic spine 10/16/2019  shows T2 hyperintense foci within the spinal cord posteriorly adjacent to C7, posterolaterally to the right adjacent to T10-T11 and possibly a focus of T9-T10 versus multilevel disc degenerative changes but no nerve root compression or spinal stenosis  REVIEW OF SYSTEMS:  Constitutional: No fevers, chills, sweats, or change in appetite.  She has fatigue Eyes: No visual changes, double vision, eye pain Ear, nose and throat: No hearing loss, ear pain, nasal congestion, sore throat Cardiovascular: No chest pain, palpitations Respiratory:  No shortness of breath at rest or with exertion.   No wheezes GastrointestinaI: No nausea, vomiting, diarrhea, abdominal pain, fecal incontinence Genitourinary:  No dysuria, urinary retention or frequency.  No nocturia. Musculoskeletal:  Pain is  improved Integumentary: No rash, pruritus, skin lesions Neurological: as above Psychiatric: She has depression and mild anxiety Endocrine: No palpitations, diaphoresis, change in appetite, change in weigh or increased thirst Hematologic/Lymphatic:  No anemia, purpura, petechiae. Allergic/Immunologic: No itchy/runny eyes, nasal congestion, recent allergic reactions, rashes  ALLERGIES: Allergies  Allergen Reactions   Shellfish Allergy Anaphylaxis   Ciprofloxacin Hives   Copaxone [Glatiramer Acetate] Hives   Penicillins Hives and Rash    Bad headaches Did it involve swelling of the face/tongue/throat, SOB, or low BP? No Did it involve sudden or severe rash/hives, skin peeling, or any reaction on the inside of your mouth or nose? No Did you need to seek medical attention at a hospital or doctor's office? No When did it last happen?      6 - 7 years If all above answers are "NO", may proceed with cephalosporin use.    Vancomycin Itching    HOME MEDICATIONS:   Current Outpatient Medications:    amLODipine (NORVASC) 2.5 MG tablet, TAKE ONE TABLET BY MOUTH DAILY Needs appointment for further refills, Disp: 90 tablet, Rfl: 3   aspirin 81 MG tablet, Take 81 mg by mouth daily., Disp: , Rfl:    BIOTIN PO, Take 10,000 mcg by mouth daily., Disp: , Rfl:    CLEARLAX 17 GM/SCOOP powder, Take 17 g by mouth daily., Disp: , Rfl:    diazepam (VALIUM) 5 MG tablet, TAKE ONE TABLET BY MOUTH THREE TIMES DAILY AS NEEDED, Disp: 90 tablet, Rfl: 5   dicyclomine (BENTYL) 10 MG capsule, Take 1 capsule (10 mg total) by mouth every 12 (twelve) hours as needed (abdominal pain)., Disp: 60 capsule, Rfl: 5   DULoxetine (CYMBALTA) 60 MG capsule, Take 1 capsule (60 mg total) by mouth at bedtime., Disp: 30 capsule, Rfl: 11   gabapentin (NEURONTIN) 600 MG tablet, Take 1 tablet (600 mg total) by mouth 4 (four) times daily., Disp: 120 tablet, Rfl: 11   levothyroxine (SYNTHROID) 112 MCG tablet, Take 112 mcg by mouth daily  before breakfast., Disp: , Rfl:    metFORMIN (GLUCOPHAGE-XR) 500 MG 24 hr tablet, Take 1,000 mg by mouth 2 (two) times daily., Disp: , Rfl:    metoprolol tartrate (LOPRESSOR) 50 MG tablet, TAKE ONE TABLET BY MOUTH TWICE DAILY, Disp: 180 tablet, Rfl: 3   modafinil (PROVIGIL) 200 MG tablet, Take 1 tablet (200 mg total) by mouth daily., Disp: 30 tablet, Rfl: 5   mometasone (ELOCON) 0.1 % cream, Apply 1 application topically daily as needed (psoriasis)., Disp: , Rfl:    nitroGLYCERIN (NITROSTAT) 0.4 MG SL tablet, Place 1 tablet (0.4 mg total) under the tongue every 5 (five) minutes as needed., Disp: 25 tablet, Rfl: 2   pantoprazole (PROTONIX) 40 MG tablet, Take 40 mg by mouth daily. , Disp: , Rfl:  rosuvastatin (CRESTOR) 5 MG tablet, Take 5 mg by mouth every other day., Disp: , Rfl:    PAST MEDICAL HISTORY: Past Medical History:  Diagnosis Date   Anxiety    Arthritis    CAD (coronary artery disease)    Heavily calcified vessels, DES to CTO RCA and DES to mid LAD September 2020   Constipation    Depression    Essential hypertension    Fatty liver    GERD (gastroesophageal reflux disease)    History of migraine    History of sleep apnea    Currently not using CPAP   Hypothyroidism    Multiple sclerosis (HCC)    Neurogenic bladder    Neuropathy    Palpitations    Restless leg syndrome    Skin cancer    Basal cell cancer on forehead, strong family hx of adenocarcinoma   Type 2 diabetes mellitus ([place])     PAST SURGICAL HISTORY: Past Surgical History:  Procedure Laterality Date   BIOPSY  11/05/2020   Procedure: BIOPSY;  Surgeon: Harvel Quale, MD;  [facility];  Service: Gastroenterology;;   CESAREAN SECTION     x 2   CHOLECYSTECTOMY     COLONOSCOPY WITH PROPOFOL N/A 11/05/2020   Procedure: COLONOSCOPY WITH PROPOFOL;  Surgeon: Harvel Quale, MD;  [facility];  Service: Gastroenterology;  Laterality: N/A;  12:30   CORONARY ATHERECTOMY  N/A 01/18/2019   Procedure: CORONARY ATHERECTOMY;  Surgeon: Martinique, Peter M, MD;  Location: [place] [facility];  Service: Cardiovascular;  Laterality: N/A;   CORONARY CTO INTERVENTION N/A 01/18/2019   Procedure: CORONARY CTO INTERVENTION;  Surgeon: Martinique, Peter M, MD;  Location: [place] [facility];  Service: Cardiovascular;  Laterality: N/A;   CORONARY STENT INTERVENTION N/A 01/18/2019   Procedure: CORONARY STENT INTERVENTION;  Surgeon: Martinique, Peter M, MD;  Location: [place] [facility];  Service: Cardiovascular;  Laterality: N/A;   ENDOMETRIAL ABLATION     ESOPHAGOGASTRODUODENOSCOPY (EGD) WITH PROPOFOL N/A 11/05/2020   Procedure: ESOPHAGOGASTRODUODENOSCOPY (EGD) WITH PROPOFOL;  Surgeon: Harvel Quale, MD;  [facility];  Service: Gastroenterology;  Laterality: N/A;   GIVENS CAPSULE STUDY N/A 11/18/2020   Procedure: GIVENS CAPSULE STUDY;  Surgeon: Harvel Quale, MD;  [facility];  Service: Gastroenterology;  Laterality: N/A;  7:30   JOINT REPLACEMENT     LEFT HEART CATH AND CORONARY ANGIOGRAPHY N/A 01/10/2019   Procedure: LEFT HEART CATH AND CORONARY ANGIOGRAPHY;  Surgeon: Belva Crome, MD;  Location: [place] [facility];  Service: Cardiovascular;  Laterality: N/A;   POLYPECTOMY  11/05/2020   Procedure: POLYPECTOMY;  Surgeon: Harvel Quale, MD;  [facility];  Service: Gastroenterology;;   TONSILLECTOMY     TOTAL HIP ARTHROPLASTY Right 12/04/2015   TOTAL HIP ARTHROPLASTY Right 12/04/2015   Procedure: TOTAL HIP ARTHROPLASTY ANTERIOR APPROACH;  Surgeon: Marybelle Killings, MD;  Location: [place];  Service: Orthopedics;  Laterality: Right;   TOTAL HIP ARTHROPLASTY Left 01/24/2016   Procedure: TOTAL HIP ARTHROPLASTY ANTERIOR APPROACH;  Surgeon: Marybelle Killings, MD;  Location: [place];  Service: Orthopedics;  Laterality: Left;  anterior approach   TUBAL LIGATION      FAMILY HISTORY: Family History  Problem Relation Age of Onset   Multiple sclerosis  Mother    Arthritis/Rheumatoid Mother    Psoriasis Father    Atrial fibrillation Father    Hypertension Brother     SOCIAL HISTORY:  Social History  Socioeconomic History   Marital status: Married    Spouse name: Not on file   Number of children: Not on file   Years of education: Not on file   Highest education level: Not on file  Occupational History   Not on file  Tobacco Use   Smoking status: Never   Smokeless tobacco: Never  Vaping Use   Vaping Use: Never used  Substance and Sexual Activity   Alcohol use: Not Currently    Comment: "none since 2014"   Drug use: Not Currently    Comment: none in 6 minths   Sexual activity: Not Currently  Other Topics Concern   Not on file  Social History Narrative   Not on file   Social Determinants of Health   Financial Resource Strain: Low Risk  (10/24/2020)   Overall Financial Resource Strain (CARDIA)    Difficulty of Paying Living Expenses: Not hard at all  Food Insecurity: No Food Insecurity (10/24/2020)   Hunger Vital Sign    Worried About Running Out of Food in the Last Year: Never true    [place] in the Last Year: Never true  Transportation Needs: No Transportation Needs (10/24/2020)   PRAPARE - Hydrologist (Medical): No    Lack of Transportation (Non-Medical): No  Physical Activity: Inactive (10/24/2020)   Exercise Vital Sign    Days of Exercise per Week: 0 days    Minutes of Exercise per Session: 0 min  Stress: No Stress Concern Present (10/24/2020)   [place]    Feeling of Stress : Only a little  Social Connections: Socially Integrated (10/24/2020)   Social Connection and Isolation Panel [NHANES]    Frequency of Communication with Friends and Family: More than three times a week    Frequency of Social Gatherings with Friends and Family: More than three times a week    Attends Religious Services: More than 4 times  per year    Active Member of [employer] or Organizations: Yes    Attends Archivist Meetings: More than 4 times per year    Marital Status: Married  Human resources officer Violence: Not At Risk (10/24/2020)   Humiliation, Afraid, Rape, and Kick questionnaire    Fear of Current or Ex-Partner: No    Emotionally Abused: No    Physically Abused: No    Sexually Abused: No     PHYSICAL EXAM  Vitals:   06/11/22 0929  BP: (!) 148/90  Pulse: 68  Weight: 197 lb 12.8 oz (89.7 kg)  Height: '5\' 2"'$  (1.575 m)    Body mass index is 36.18 kg/m.   General: The patient is well-developed and well-nourished and in no acute distress  Musculoskeletal: She has tenderness in the mid thoracic spine and the trapezius muscles.  Neurologic Exam  Mental status: The patient is alert and oriented x 3 at the time of the examination. The patient has apparent normal recent and remote memory, with an apparently normal attention span and concentration ability.   Speech is normal.  Mood:  She is tearful initially and expresses frustration with her pain.    Cranial nerves: Extraocular movements are full.  Facial strength and sensation is normal.  Trapezius strength is strong.  No obvious hearing deficits are noted.  Motor:  Muscle bulk is normal and tone is mildly increased in legs.   Strength is  5 / 5 in all 4 extremities except 4+/5  in the left leg hip flexors  Sensory: She has normal touch and vibration sensation in the arms.  She has reduced left leg vibration sensation.   Normal sensation in the hands.  She has Tinels signs over both elbows.  No median Tinel's.    No Phalen's sign.      Coordination: Finger-nose-finger is performed well.  Heel-to-shin is slightly reduced on the left  Gait and station: Station is normal.  Gait is minimally wide but the tandem gait is wide.. Romberg is positive  Reflexes: Deep tendon reflexes are symmetric into in the arms and 3 in the  legs.   ______________________________________________  ASSESSMENT AND PLAN  Multiple sclerosis ([place]) - Plan: MR BRAIN WO CONTRAST  Dysesthesia - Plan: MR BRAIN WO CONTRAST  Ulnar neuritis, unspecified laterality  Memory loss  1.   She will continue off of a disease modifying therapy.  She has not had any exacerbations since being off she has a low plaque burden..  She has been stable with no new exacerbations.  She does note some mild cognitive issues. We will check MRI brain this year 2.   If urinary dysfunction worsens we can restart tamsulosin or change to Rapaflo. 3.   Continue diazepam for spasticity. 4.   Gabapentin up to 600 mg po qid.      5.   Provigil for MS fatigue.  May help focus some.     6.   Appears to have mild ulnar neuropathy.   As no weakness, we will hold off on NCV/EMG.  Advised to avoid pressure near ulnar groove.   Return in 6 months or sooner if there are new or worsening neurologic symptoms.  Mattheu Brodersen A. Felecia Shelling, MD, PhD 07/03/9189, 66:06 AM Certified in Neurology, Clinical Neurophysiology, Sleep Medicine, Pain Medicine and Neuroimaging  [facility] [address], [place], [place] [address] [phone]'

## 2022-06-16 ENCOUNTER — Encounter: Payer: Self-pay | Admitting: Neurology

## 2022-06-16 ENCOUNTER — Telehealth: Payer: Self-pay | Admitting: Neurology

## 2022-06-16 NOTE — Telephone Encounter (Signed)
Dillon Bjork: [id number] exp. 06/16/22-07/15/22, medicare NPR sent to [facility] [phone]

## 2022-06-18 ENCOUNTER — Other Ambulatory Visit: Payer: Self-pay | Admitting: Neurology

## 2022-06-24 ENCOUNTER — Telehealth: Payer: Self-pay | Admitting: *Deleted

## 2022-06-24 NOTE — Telephone Encounter (Signed)
PA Case: [id number], Status: Approved, Coverage Starts on: 05/04/2022 12:00:00 AM, Coverage Ends on: 05/04/2023 12:00:00 AM. Questions? Contact [phone].

## 2022-06-24 NOTE — Telephone Encounter (Signed)
Submitted PA modafinil on covermymeds. Key: [id number]. Waiting on determination from [facility].

## 2022-07-02 ENCOUNTER — Ambulatory Visit
Admission: RE | Admit: 2022-07-02 | Discharge: 2022-07-02 | Disposition: A | Discharge status: Home / Self Care | Payer: BC Managed Care – PPO | Source: Ambulatory Visit | Attending: Neurology | Admitting: Neurology

## 2022-07-02 DIAGNOSIS — R208 Other disturbances of skin sensation: Secondary | ICD-10-CM

## 2022-07-02 DIAGNOSIS — G35 Multiple sclerosis: Secondary | ICD-10-CM

## 2022-07-06 DIAGNOSIS — Z880 Allergy status to penicillin: Secondary | ICD-10-CM | POA: Diagnosis not present

## 2022-07-06 DIAGNOSIS — R7989 Other specified abnormal findings of blood chemistry: Secondary | ICD-10-CM | POA: Diagnosis not present

## 2022-07-06 DIAGNOSIS — Z888 Allergy status to other drugs, medicaments and biological substances status: Secondary | ICD-10-CM | POA: Diagnosis not present

## 2022-07-06 DIAGNOSIS — R748 Abnormal levels of other serum enzymes: Secondary | ICD-10-CM | POA: Diagnosis not present

## 2022-07-06 DIAGNOSIS — R509 Fever, unspecified: Secondary | ICD-10-CM | POA: Diagnosis not present

## 2022-07-06 DIAGNOSIS — Z20822 Contact with and (suspected) exposure to covid-19: Secondary | ICD-10-CM | POA: Diagnosis not present

## 2022-07-06 DIAGNOSIS — R112 Nausea with vomiting, unspecified: Secondary | ICD-10-CM | POA: Diagnosis not present

## 2022-07-06 DIAGNOSIS — Z881 Allergy status to other antibiotic agents status: Secondary | ICD-10-CM | POA: Diagnosis not present

## 2022-07-06 DIAGNOSIS — N3090 Cystitis, unspecified without hematuria: Secondary | ICD-10-CM | POA: Diagnosis not present

## 2022-07-06 DIAGNOSIS — R197 Diarrhea, unspecified: Secondary | ICD-10-CM | POA: Diagnosis not present

## 2022-07-10 LAB — CYTOLOGY - PAP

## 2022-07-16 ENCOUNTER — Inpatient Hospital Stay: Payer: BC Managed Care – PPO | Attending: Hematology

## 2022-07-16 DIAGNOSIS — R519 Headache, unspecified: Secondary | ICD-10-CM | POA: Insufficient documentation

## 2022-07-16 DIAGNOSIS — R1013 Epigastric pain: Secondary | ICD-10-CM | POA: Diagnosis not present

## 2022-07-16 DIAGNOSIS — G2581 Restless legs syndrome: Secondary | ICD-10-CM | POA: Insufficient documentation

## 2022-07-16 DIAGNOSIS — I10 Essential (primary) hypertension: Secondary | ICD-10-CM | POA: Diagnosis not present

## 2022-07-16 DIAGNOSIS — E538 Deficiency of other specified B group vitamins: Secondary | ICD-10-CM | POA: Diagnosis not present

## 2022-07-16 DIAGNOSIS — D509 Iron deficiency anemia, unspecified: Secondary | ICD-10-CM | POA: Insufficient documentation

## 2022-07-16 LAB — CBC WITH DIFFERENTIAL/PLATELET
Abs Immature Granulocytes: 0.03 10*3/uL (ref 0.00–0.07)
Basophils Absolute: 0 10*3/uL (ref 0.0–0.1)
Basophils Relative: 0 %
Eosinophils Absolute: 0.1 10*3/uL (ref 0.0–0.5)
Eosinophils Relative: 1 %
HCT: 40.3 % (ref 36.0–46.0)
Hemoglobin: 13.7 g/dL (ref 12.0–15.0)
Immature Granulocytes: 1 %
Lymphocytes Relative: 21 %
Lymphs Abs: 1.4 10*3/uL (ref 0.7–4.0)
MCH: 29 pg (ref 26.0–34.0)
MCHC: 34 g/dL (ref 30.0–36.0)
MCV: 85.4 fL (ref 80.0–100.0)
Monocytes Absolute: 0.4 10*3/uL (ref 0.1–1.0)
Monocytes Relative: 5 %
Neutro Abs: 4.7 10*3/uL (ref 1.7–7.7)
Neutrophils Relative %: 72 %
Platelets: 200 10*3/uL (ref 150–400)
RBC: 4.72 MIL/uL (ref 3.87–5.11)
RDW: 14.6 % (ref 11.5–15.5)
WBC: 6.6 10*3/uL (ref 4.0–10.5)
nRBC: 0 % (ref 0.0–0.2)

## 2022-07-16 LAB — FOLATE: Folate: 31.6 ng/mL (ref >5.9)

## 2022-07-16 LAB — IRON AND TIBC
Iron: 88 ug/dL (ref 28–170)
Saturation Ratios: 26 % (ref 10.4–31.8)
TIBC: 344 ug/dL (ref 250–450)
UIBC: 256 ug/dL

## 2022-07-16 LAB — VITAMIN B12: Vitamin B-12: 115 pg/mL — ABNORMAL LOW (ref 180–914)

## 2022-07-16 LAB — FERRITIN: Ferritin: 56 ng/mL (ref 11–307)

## 2022-07-19 LAB — METHYLMALONIC ACID, SERUM: Methylmalonic Acid, Quantitative: 256 nmol/L (ref 0–378)

## 2022-07-21 NOTE — Progress Notes (Addendum)
Mary Strong, [place] [address]   CLINIC:  Medical Oncology/Hematology  PCP:  Caryl Bis, MD [place] [address]    [phone]   REASON FOR VISIT:  Follow-up for iron deficiency anemia   PRIOR THERAPY: Oral iron tablet (unable to tolerate)   CURRENT THERAPY: Intermittent IV iron (Injectafer x2 on 11/01/2020 and 11/08/2020)  INTERVAL HISTORY:   Mary Strong 62 y.o. female returns for routine follow-up of iron deficiency anemia, suspected to be secondary to malabsorption.  She was last evaluated by Tarri Abernethy PA-C via telemedicine visit on 01/15/2022.  Most recent IV iron repletion was with Injectafer 750 mg on 11/01/2020 and 11/08/2020.   Overall, she is doing fairly well, although she does note some increased fatigue recently more so than her baseline MS fatigue.  She denies any epistaxis, hematemesis, hematochezia, or melena.  No pica.  She does have occasional restless leg symptoms and headaches.  She reports positional dizziness, but denies any lightheadedness or syncope.    She reports exertional chest pain relieved with rest that has occurred twice in the past month.  She denies any chest pain at the time of our visit.  She reports that she has plans to call her cardiologist later this afternoon to inform them of her symptoms.  She denies any dyspnea or shortness of breath.  She continues to take her folic acid.  She does not take any vitamin B12 supplement and reports diet that is low in meat.  She reports intermittent stomach pain, followed by Dr. Jenetta Downer (gastroenterology).  Patient reports that she has difficulty tolerating oral vitamin supplements, as they cause significant indigestion and upset stomach.    She has 60% energy and 80% appetite. She endorses that she is maintaining a stable weight.   ASSESSMENT & PLAN:   Iron deficiency anemia - Etiology suspected to be malabsorption in the setting of long-term PPI use -  Extensive GI work-up in July 2022 (EGD, colonoscopy, capsule study) did not reveal any source of blood loss - She is unable to tolerate oral iron supplements due to side effects - Received IV iron repletion with Injectafer x2 on 11/01/2020 and 11/08/2020 - No obvious bleeding such as bright red blood per rectum, melena, or epistaxis     - Most recent labs (07/16/2022): Normal CBC with Hgb 13.7.  Ferritin 56, iron saturation 26%. - PLAN: Recommend IV iron (Feraheme) x 1 dose (symptomatic iron deficiency with fatigue in the setting of ferritin <100) - Labs and phone visit in 6 months   2.  Folic acid deficiency - Folic acid deficiency noted in September 2023 with folate 2.8 - Has been taking the A999333 mcg folic acid supplement daily since September 2023 - Most recent folate (07/16/2022): Normal at 31.6 - PLAN: Continue A999333 mcg folic acid, can decrease to EVERY OTHER DAY  3.  Vitamin B12 deficiency - Most recent labs (07/16/2022) show low vitamin B12 115 with MMA 256 - She does not take any vitamin B12 supplement and reports diet that is low in meat - PLAN: Vitamin B12 injection x 1 given in clinic today. - Prescription for vitamin B12 vials and needles/syringes sent to pharmacy.  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  Indigestion - Follows with Dr. Jenetta Downer for her gastroenterological complaints - Patient reports that she has difficulty tolerating oral vitamin supplements, as they cause significant indigestion and upset stomach. - She  also reports that for the past year she has been unable to tolerate meat and some dairy products, reports that she feels that she gets full easily. - Reports tick bite 2 years ago. - PLAN: Discussed with patient that we may want to check alpha-gal panel.  She would like to discuss this with her PCP first.    5.  Other history - She is a retired Marine scientist, previously worked at [employer] and [employer] - [place] is also positive for  MS  PLAN SUMMARY: >> Vitamin B12 injection given in clinic TODAY >> Injectafer 750 mg x 1 >> Labs in 6 months = CBC/D, CMP, ferritin, iron/TIBC, folate, B12, MMA >> PHONE visit in 6 months (1 week after labs)      REVIEW OF SYSTEMS:   Review of Systems  Constitutional:  Positive for diaphoresis and fatigue. Negative for appetite change, chills, fever and unexpected weight change.  HENT:   Negative for lump/mass and nosebleeds.   Eyes:  Negative for eye problems.  Respiratory:  Negative for cough, hemoptysis and shortness of breath.   Cardiovascular:  Positive for chest pain. Negative for leg swelling and palpitations.  Gastrointestinal:  Positive for nausea. Negative for abdominal pain, blood in stool, constipation, diarrhea and vomiting.  Genitourinary:  Positive for difficulty urinating (hesitancy). Negative for hematuria.   Skin: Negative.   Neurological:  Negative for dizziness, headaches and light-headedness.  Hematological:  Does not bruise/bleed easily.  Psychiatric/Behavioral:  Positive for sleep disturbance.      PHYSICAL EXAM:  ECOG PERFORMANCE STATUS: 1 - Symptomatic but completely ambulatory  There were no vitals filed for this visit. There were no vitals filed for this visit. Physical Exam Constitutional:      Appearance: Normal appearance. She is obese.  Cardiovascular:     Heart sounds: Normal heart sounds.  Pulmonary:     Breath sounds: Normal breath sounds.  Neurological:     General: No focal deficit present.     Mental Status: Mental status is at baseline.  Psychiatric:        Behavior: Behavior normal. Behavior is cooperative.     PAST MEDICAL/SURGICAL HISTORY:  Past Medical History:  Diagnosis Date   Anxiety    Arthritis    CAD (coronary artery disease)    Heavily calcified vessels, DES to CTO RCA and DES to mid LAD September 2020   Constipation    Depression    Essential hypertension    Fatty liver    GERD (gastroesophageal reflux disease)     History of migraine    History of sleep apnea    Currently not using CPAP   Hypothyroidism    Multiple sclerosis (HCC)    Neurogenic bladder    Neuropathy    Palpitations    Restless leg syndrome    Skin cancer    Basal cell cancer on forehead, strong family hx of adenocarcinoma   Type 2 diabetes mellitus ([place])    Past Surgical History:  Procedure Laterality Date   BIOPSY  11/05/2020   Procedure: BIOPSY;  Surgeon: Harvel Quale, MD;  [facility];  Service: Gastroenterology;;   CESAREAN SECTION     x 2   CHOLECYSTECTOMY     COLONOSCOPY WITH PROPOFOL N/A 11/05/2020   Procedure: COLONOSCOPY WITH PROPOFOL;  Surgeon: Harvel Quale, MD;  [facility];  Service: Gastroenterology;  Laterality: N/A;  12:30   CORONARY ATHERECTOMY N/A 01/18/2019   Procedure: CORONARY ATHERECTOMY;  Surgeon: Martin[place],  Ander Slade, MD;  Location: [facility];  Service: Cardiovascular;  Laterality: N/A;   CORONARY CTO INTERVENTION N/A 01/18/2019   Procedure: CORONARY CTO INTERVENTION;  Surgeon: Martinique, Peter M, MD;  Location: [facility];  Service: Cardiovascular;  Laterality: N/A;   CORONARY STENT INTERVENTION N/A 01/18/2019   Procedure: CORONARY STENT INTERVENTION;  Surgeon: Martinique, Peter M, MD;  Location: [facility];  Service: Cardiovascular;  Laterality: N/A;   ENDOMETRIAL ABLATION     ESOPHAGOGASTRODUODENOSCOPY (EGD) WITH PROPOFOL N/A 11/05/2020   Procedure: ESOPHAGOGASTRODUODENOSCOPY (EGD) WITH PROPOFOL;  Surgeon: Harvel Quale, MD;  [facility];  Service: Gastroenterology;  Laterality: N/A;   GIVENS CAPSULE STUDY N/A 11/18/2020   Procedure: GIVENS CAPSULE STUDY;  Surgeon: Harvel Quale, MD;  [facility];  Service: Gastroenterology;  Laterality: N/A;  7:30   JOINT REPLACEMENT     LEFT HEART CATH AND CORONARY ANGIOGRAPHY N/A 01/10/2019   Procedure: LEFT HEART CATH AND CORONARY ANGIOGRAPHY;  Surgeon: Belva Crome, MD;  Location: [facility];  Service: Cardiovascular;  Laterality: N/A;   POLYPECTOMY  11/05/2020   Procedure: POLYPECTOMY;  Surgeon: Harvel Quale, MD;  [facility];  Service: Gastroenterology;;   TONSILLECTOMY     TOTAL HIP ARTHROPLASTY Right 12/04/2015   TOTAL HIP ARTHROPLASTY Right 12/04/2015   Procedure: TOTAL HIP ARTHROPLASTY ANTERIOR APPROACH;  Surgeon: Marybelle Killings, MD;  Location: [facility];  Service: Orthopedics;  Laterality: Right;   TOTAL HIP ARTHROPLASTY Left 01/24/2016   Procedure: TOTAL HIP ARTHROPLASTY ANTERIOR APPROACH;  Surgeon: Marybelle Killings, MD;  Location: [facility];  Service: Orthopedics;  Laterality: Left;  anterior approach   TUBAL LIGATION      SOCIAL HISTORY:  Social History   Socioeconomic History   Marital status: Married    Spouse name: Not on file   Number of children: Not on file   Years of education: Not on file   Highest education level: Not on file  Occupational History   Not on file  Tobacco Use   Smoking status: Never   Smokeless tobacco: Never  Vaping Use   Vaping Use: Never used  Substance and Sexual Activity   Alcohol use: Not Currently    Comment: "none since 2014"   Drug use: Not Currently    Comment: none in 6 minths   Sexual activity: Not Currently  Other Topics Concern   Not on file  Social History Narrative   Not on file   Social Determinants of Health   Financial Resource Strain: Low Risk  (10/24/2020)   Overall Financial Resource Strain (CARDIA)    Difficulty of Paying Living Expenses: Not hard at all  Food Insecurity: No Food Insecurity (10/24/2020)   Hunger Vital Sign    Worried About Running Out of Food in the Last Year: Never true    [place] in the Last Year: Never true  Transportation Needs: No Transportation Needs (10/24/2020)   PRAPARE - Hydrologist (Medical): No    Lack of Transportation (Non-Medical): No  Physical Activity: Inactive (10/24/2020)    Exercise Vital Sign    Days of Exercise per Week: 0 days    Minutes of Exercise per Session: 0 min  Stress: No Stress Concern Present (10/24/2020)   [place]    Feeling of Stress : Only a little  Social Connections: Socially Integrated (10/24/2020)  Social Licensed conveyancer [NHANES]    Frequency of Communication with Friends and Family: More than three times a week    Frequency of Social Gatherings with Friends and Family: More than three times a week    Attends Religious Services: More than 4 times per year    Active Member of [employer] or Organizations: Yes    Attends Music therapist: More than 4 times per year    Marital Status: Married  Human resources officer Violence: Not At Risk (10/24/2020)   Humiliation, Afraid, Rape, and Kick questionnaire    Fear of Current or Ex-Partner: No    Emotionally Abused: No    Physically Abused: No    Sexually Abused: No    FAMILY HISTORY:  Family History  Problem Relation Age of Onset   Multiple sclerosis Mother    Arthritis/Rheumatoid Mother    Psoriasis Father    Atrial fibrillation Father    Hypertension Brother     CURRENT MEDICATIONS:  Outpatient Encounter Medications as of 07/23/2022  Medication Sig   amLODipine (NORVASC) 2.5 MG tablet TAKE ONE TABLET BY MOUTH DAILY Needs appointment for further refills   aspirin 81 MG tablet Take 81 mg by mouth daily.   BIOTIN PO Take 10,000 mcg by mouth daily.   CLEARLAX 17 GM/SCOOP powder Take 17 g by mouth daily.   diazepam (VALIUM) 5 MG tablet TAKE ONE TABLET BY MOUTH THREE TIMES DAILY AS NEEDED   dicyclomine (BENTYL) 10 MG capsule Take 1 capsule (10 mg total) by mouth every 12 (twelve) hours as needed (abdominal pain).   DULoxetine (CYMBALTA) 60 MG capsule Take 1 capsule (60 mg total) by mouth at bedtime.   gabapentin (NEURONTIN) 600 MG tablet TAKE ONE TABLET BY MOUTH FOUR TIMES DAILY   levothyroxine  (SYNTHROID) 112 MCG tablet Take 112 mcg by mouth daily before breakfast.   metFORMIN (GLUCOPHAGE-XR) 500 MG 24 hr tablet Take 1,000 mg by mouth 2 (two) times daily.   metoprolol tartrate (LOPRESSOR) 50 MG tablet TAKE ONE TABLET BY MOUTH TWICE DAILY   modafinil (PROVIGIL) 200 MG tablet Take 1 tablet (200 mg total) by mouth daily.   mometasone (ELOCON) 0.1 % cream Apply 1 application topically daily as needed (psoriasis).   nitroGLYCERIN (NITROSTAT) 0.4 MG SL tablet Place 1 tablet (0.4 mg total) under the tongue every 5 (five) minutes as needed.   pantoprazole (PROTONIX) 40 MG tablet Take 40 mg by mouth daily.    rosuvastatin (CRESTOR) 5 MG tablet Take 5 mg by mouth every other day.   No facility-administered encounter medications on file as of 07/23/2022.    ALLERGIES:  Allergies  Allergen Reactions   Shellfish Allergy Anaphylaxis   Ciprofloxacin Hives   Copaxone [Glatiramer Acetate] Hives   Penicillins Hives and Rash    Bad headaches Did it involve swelling of the face/tongue/throat, SOB, or low BP? No Did it involve sudden or severe rash/hives, skin peeling, or any reaction on the inside of your mouth or nose? No Did you need to seek medical attention at a hospital or doctor's office? No When did it last happen?      6 - 7 years If all above answers are "NO", may proceed with cephalosporin use.    Vancomycin Itching    LABORATORY DATA:  I have reviewed the labs as listed.  CBC    Component Value Date/Time   WBC 6.6 07/16/2022 1356   RBC 4.72 07/16/2022 1356   HGB 13.7 07/16/2022 1356  HGB 11.9 09/18/2015 1155   HCT 40.3 07/16/2022 1356   HCT 34.5 09/18/2015 1155   PLT 200 07/16/2022 1356   PLT 215 09/18/2015 1155   MCV 85.4 07/16/2022 1356   MCV 84 09/18/2015 1155   MCH 29.0 07/16/2022 1356   MCHC 34.0 07/16/2022 1356   RDW 14.6 07/16/2022 1356   RDW 16.8 (H) 09/18/2015 1155   LYMPHSABS 1.4 07/16/2022 1356   LYMPHSABS 2.4 09/18/2015 1155   MONOABS 0.4 07/16/2022  1356   EOSABS 0.1 07/16/2022 1356   EOSABS 0.6 (H) 09/18/2015 1155   BASOSABS 0.0 07/16/2022 1356   BASOSABS 0.0 09/18/2015 1155      Latest Ref Rng & Units 07/27/2021    5:47 AM 07/26/2021   10:04 PM 02/27/2019    2:28 PM  CMP  Glucose 70 - 99 mg/dL 152  114  95   BUN 6 - 20 mg/dL 8  10  12    Creatinine 0.44 - 1.00 mg/dL 0.88  0.88  1.02   Sodium 135 - 145 mmol/L 143  140  140   Potassium 3.5 - 5.1 mmol/L 3.6  3.2  4.5   Chloride 98 - 111 mmol/L 109  107  106   CO2 22 - 32 mmol/L 26  24  26    Calcium 8.9 - 10.3 mg/dL 8.9  8.9  9.3   Total Protein 6.5 - 8.1 g/dL 6.5   6.7   Total Bilirubin 0.3 - 1.2 mg/dL 0.5   0.4   Alkaline Phos 38 - 126 U/L 54     AST 15 - 41 U/L 30   19   ALT 0 - 44 U/L 25   13     DIAGNOSTIC IMAGING:  I have independently reviewed the relevant imaging and discussed with the patient.   WRAP UP:  All questions were answered. The patient knows to call the clinic with any problems, questions or concerns.  Medical decision making: Moderate  Time spent on visit: I spent 20 minutes counseling the patient face to face. The total time spent in the appointment was 30 minutes and more than 50% was on counseling.  Harriett Rush, PA-C  07/23/22 3:20 PM

## 2022-07-23 ENCOUNTER — Inpatient Hospital Stay: Payer: BC Managed Care – PPO

## 2022-07-23 ENCOUNTER — Other Ambulatory Visit: Payer: Self-pay

## 2022-07-23 ENCOUNTER — Inpatient Hospital Stay (HOSPITAL_BASED_OUTPATIENT_CLINIC_OR_DEPARTMENT_OTHER): Payer: BC Managed Care – PPO | Admitting: Physician Assistant

## 2022-07-23 VITALS — BP 119/64 | HR 70 | Temp 98.9°F | Resp 18 | Ht 62.0 in | Wt 190.9 lb

## 2022-07-23 DIAGNOSIS — D509 Iron deficiency anemia, unspecified: Secondary | ICD-10-CM

## 2022-07-23 DIAGNOSIS — I10 Essential (primary) hypertension: Secondary | ICD-10-CM | POA: Diagnosis not present

## 2022-07-23 DIAGNOSIS — E538 Deficiency of other specified B group vitamins: Secondary | ICD-10-CM | POA: Diagnosis not present

## 2022-07-23 DIAGNOSIS — R519 Headache, unspecified: Secondary | ICD-10-CM | POA: Diagnosis not present

## 2022-07-23 DIAGNOSIS — R1013 Epigastric pain: Secondary | ICD-10-CM | POA: Diagnosis not present

## 2022-07-23 DIAGNOSIS — G2581 Restless legs syndrome: Secondary | ICD-10-CM | POA: Diagnosis not present

## 2022-07-23 MED ORDER — CYANOCOBALAMIN 1000 MCG/ML IJ SOLN
1000.0000 ug | Freq: Once | INTRAMUSCULAR | Status: AC
  Administered 2022-07-23: 1000 ug via INTRAMUSCULAR
  Filled 2022-07-23: qty 1

## 2022-07-23 MED ORDER — NEEDLES & SYRINGES MISC: 0 refills | Status: AC

## 2022-07-23 MED ORDER — CYANOCOBALAMIN 1000 MCG/ML IJ SOLN: INTRAMUSCULAR | 0 refills | Status: DC

## 2022-07-23 NOTE — Patient Instructions (Signed)
[facility] at [facility] **   You were seen today by Tarri Abernethy PA-C for your follow-up visit.    IRON DEFICIENCY We will schedule you for IV iron x 1 dose.  VITAMIN B-12 DEFICIENCY You were given vitamin B12 injection x 1 today. Prescription sent to pharmacy for you to give yourself a vitamin B12 injection weekly for the next month.  After that, you will give yourself monthly vitamin B12 injections.  FOLATE DEFICIENCY Continue folic acid, but decrease this to every other day rather than every day.   OTHER INSTRUCTIONS: Please speak with your cardiologist as soon as possible regarding your chest pain.  LABS: Return in 6 months for repeat labs  FOLLOW-UP APPOINTMENT: Phone visit in 6 months (1 week after labs)  ** Thank you for trusting me with your healthcare!  I strive to provide all of my patients with quality care at each visit.  If you receive a survey for this visit, I would be so grateful to you for taking the time to provide feedback.  Thank you in advance!  ~ Madline Oesterling                   Dr. Derek Jack   &   Tarri Abernethy, PA-C   - - - - - - - - - - - - - - - - - -    Thank you for choosing [facility] at [facility] to provide your oncology and hematology care.  To afford each patient quality time with our provider, please arrive at least 15 minutes before your scheduled appointment time.   If you have a lab appointment with the [facility] please come in thru the Main Entrance and check in at the main information desk.  You need to re-schedule your appointment should you arrive 10 or more minutes late.  We strive to give you quality time with our providers, and arriving late affects you and other patients whose appointments are after yours.  Also, if you no show three or more times for appointments you may be dismissed from the clinic at the providers discretion.      Again, thank you for choosing [facility].  Our hope is that these requests will decrease the amount of time that you wait before being seen by our physicians.       _____________________________________________________________  Should you have questions after your visit to [facility], please contact our office at [phone] and follow the prompts.  Our office hours are 8:00 a.m. and 4:30 p.m. Monday - Friday.  Please note that voicemails left after 4:00 p.m. may not be returned until the following business day.  We are closed weekends and major holidays.  You do have access to a nurse 24-7, just call the main number to the clinic [phone] and do not press any options, hold on the line and a nurse will answer the phone.    For prescription refill requests, have your pharmacy contact our office and allow 72 hours.

## 2022-07-23 NOTE — Progress Notes (Signed)
Patient in clinic today for follow up visit with PA. PA ordered B-12 Injection. See MAR for injection information. Patient remained stable throughout injection. Patient discharged from clinic ambulatory and in stable condition.

## 2022-07-23 NOTE — Patient Instructions (Signed)
[facility]  Discharge Instructions: Thank you for choosing [facility] to provide your oncology and hematology care.  If you have a lab appointment with the [facility], please come in thru the Main Entrance and check in at the main information desk.  Wear comfortable clothing and clothing appropriate for easy access to any Portacath or PICC line.   We strive to give you quality time with your provider. You may need to reschedule your appointment if you arrive late (15 or more minutes).  Arriving late affects you and other patients whose appointments are after yours.  Also, if you miss three or more appointments without notifying the office, you may be dismissed from the clinic at the provider's discretion.      For prescription refill requests, have your pharmacy contact our office and allow 72 hours for refills to be completed.    Today you received a B-12 injection today.    To help prevent nausea and vomiting after your treatment, we encourage you to take your nausea medication as directed.  BELOW ARE SYMPTOMS THAT SHOULD BE REPORTED IMMEDIATELY: *FEVER GREATER THAN 100.4 F (38 C) OR HIGHER *CHILLS OR SWEATING *NAUSEA AND VOMITING THAT IS NOT CONTROLLED WITH YOUR NAUSEA MEDICATION *UNUSUAL SHORTNESS OF BREATH *UNUSUAL BRUISING OR BLEEDING *URINARY PROBLEMS (pain or burning when urinating, or frequent urination) *BOWEL PROBLEMS (unusual diarrhea, constipation, pain near the anus) TENDERNESS IN MOUTH AND THROAT WITH OR WITHOUT PRESENCE OF ULCERS (sore throat, sores in mouth, or a toothache) UNUSUAL RASH, SWELLING OR PAIN  UNUSUAL VAGINAL DISCHARGE OR ITCHING   Items with * indicate a potential emergency and should be followed up as soon as possible or go to the Emergency Department if any problems should occur.  Please show the CHEMOTHERAPY ALERT CARD or IMMUNOTHERAPY ALERT CARD at check-in to the Emergency Department and triage nurse.  Should  you have questions after your visit or need to cancel or reschedule your appointment, please contact [facility] [phone]  and follow the prompts.  Office hours are 8:00 a.m. to 4:30 p.m. Monday - Friday. Please note that voicemails left after 4:00 p.m. may not be returned until the following business day.  We are closed weekends and major holidays. You have access to a nurse at all times for urgent questions. Please call the main number to the clinic [phone] and follow the prompts.  For any non-urgent questions, you may also contact your provider using MyChart. We now offer e-Visits for anyone 79 and older to request care online for non-urgent symptoms. For details visit [email or web address].   Also download the MyChart app! Go to the app store, search "MyChart", open the app, select [facility], and log in with your MyChart username and password.

## 2022-07-24 ENCOUNTER — Encounter: Payer: Self-pay | Admitting: Hematology

## 2022-07-24 NOTE — Addendum Note (Signed)
Addended by: Tarri Abernethy on: 07/24/2022 10:06 AM   Modules accepted: Orders

## 2022-07-31 ENCOUNTER — Inpatient Hospital Stay: Payer: BC Managed Care – PPO

## 2022-07-31 VITALS — BP 131/73 | HR 76 | Temp 98.6°F | Resp 16

## 2022-07-31 DIAGNOSIS — I10 Essential (primary) hypertension: Secondary | ICD-10-CM | POA: Diagnosis not present

## 2022-07-31 DIAGNOSIS — G2581 Restless legs syndrome: Secondary | ICD-10-CM | POA: Diagnosis not present

## 2022-07-31 DIAGNOSIS — R1013 Epigastric pain: Secondary | ICD-10-CM | POA: Diagnosis not present

## 2022-07-31 DIAGNOSIS — D509 Iron deficiency anemia, unspecified: Secondary | ICD-10-CM

## 2022-07-31 DIAGNOSIS — E538 Deficiency of other specified B group vitamins: Secondary | ICD-10-CM | POA: Diagnosis not present

## 2022-07-31 DIAGNOSIS — R519 Headache, unspecified: Secondary | ICD-10-CM | POA: Diagnosis not present

## 2022-07-31 MED ORDER — SODIUM CHLORIDE 0.9 % IV SOLN: Freq: Once | INTRAVENOUS | Status: AC

## 2022-07-31 MED ORDER — LORATADINE 10 MG PO TABS
10.0000 mg | ORAL_TABLET | Freq: Once | ORAL | Status: DC
  Filled 2022-07-31: qty 1

## 2022-07-31 MED ORDER — ACETAMINOPHEN 325 MG PO TABS
650.0000 mg | ORAL_TABLET | Freq: Once | ORAL | Status: AC
  Administered 2022-07-31: 650 mg via ORAL
  Filled 2022-07-31: qty 2

## 2022-07-31 MED ORDER — CETIRIZINE HCL 10 MG PO TABS
10.0000 mg | ORAL_TABLET | Freq: Once | ORAL | Status: AC
  Administered 2022-07-31: 10 mg via ORAL
  Filled 2022-07-31: qty 1

## 2022-07-31 MED ORDER — SODIUM CHLORIDE 0.9 % IV SOLN
510.0000 mg | Freq: Once | INTRAVENOUS | Status: AC
  Administered 2022-07-31: 510 mg via INTRAVENOUS
  Filled 2022-07-31: qty 510

## 2022-07-31 NOTE — Patient Instructions (Signed)
[facility]  Discharge Instructions: Thank you for choosing [facility] to provide your oncology and hematology care.  If you have a lab appointment with the [facility], please come in thru the Main Entrance and check in at the main information desk.  Wear comfortable clothing and clothing appropriate for easy access to any Portacath or PICC line.   We strive to give you quality time with your provider. You may need to reschedule your appointment if you arrive late (15 or more minutes).  Arriving late affects you and other patients whose appointments are after yours.  Also, if you miss three or more appointments without notifying the office, you may be dismissed from the clinic at the provider's discretion.      For prescription refill requests, have your pharmacy contact our office and allow 72 hours for refills to be completed.    Today you received the following, feraheme infusion   To help prevent nausea and vomiting after your treatment, we encourage you to take your nausea medication as directed.  BELOW ARE SYMPTOMS THAT SHOULD BE REPORTED IMMEDIATELY: *FEVER GREATER THAN 100.4 F (38 C) OR HIGHER *CHILLS OR SWEATING *NAUSEA AND VOMITING THAT IS NOT CONTROLLED WITH YOUR NAUSEA MEDICATION *UNUSUAL SHORTNESS OF BREATH *UNUSUAL BRUISING OR BLEEDING *URINARY PROBLEMS (pain or burning when urinating, or frequent urination) *BOWEL PROBLEMS (unusual diarrhea, constipation, pain near the anus) TENDERNESS IN MOUTH AND THROAT WITH OR WITHOUT PRESENCE OF ULCERS (sore throat, sores in mouth, or a toothache) UNUSUAL RASH, SWELLING OR PAIN  UNUSUAL VAGINAL DISCHARGE OR ITCHING   Items with * indicate a potential emergency and should be followed up as soon as possible or go to the Emergency Department if any problems should occur.  Please show the CHEMOTHERAPY ALERT CARD or IMMUNOTHERAPY ALERT CARD at check-in to the Emergency Department and triage  nurse.  Should you have questions after your visit or need to cancel or reschedule your appointment, please contact [facility] [phone]  and follow the prompts.  Office hours are 8:00 a.m. to 4:30 p.m. Monday - Friday. Please note that voicemails left after 4:00 p.m. may not be returned until the following business day.  We are closed weekends and major holidays. You have access to a nurse at all times for urgent questions. Please call the main number to the clinic [phone] and follow the prompts.  For any non-urgent questions, you may also contact your provider using MyChart. We now offer e-Visits for anyone 84 and older to request care online for non-urgent symptoms. For details visit [email or web address].   Also download the MyChart app! Go to the app store, search "MyChart", open the app, select [facility], and log in with your MyChart username and password.

## 2022-07-31 NOTE — Progress Notes (Signed)
Feraheme infusion given per orders. Patient tolerated it well without problems. Vitals stable and discharged home from clinic ambulatory. Follow up as scheduled.  

## 2022-08-06 ENCOUNTER — Telehealth: Payer: Self-pay | Admitting: Nurse Practitioner

## 2022-08-06 ENCOUNTER — Ambulatory Visit: Payer: BC Managed Care – PPO | Attending: Nurse Practitioner | Admitting: Nurse Practitioner

## 2022-08-06 ENCOUNTER — Other Ambulatory Visit (HOSPITAL_COMMUNITY)
Admission: RE | Admit: 2022-08-06 | Discharge: 2022-08-06 | Disposition: A | Discharge status: Home / Self Care | Payer: BC Managed Care – PPO | Source: Ambulatory Visit | Attending: Nurse Practitioner | Admitting: Nurse Practitioner

## 2022-08-06 ENCOUNTER — Encounter: Payer: Self-pay | Admitting: Nurse Practitioner

## 2022-08-06 VITALS — BP 142/90 | HR 70 | Ht 62.0 in | Wt 191.4 lb

## 2022-08-06 DIAGNOSIS — R079 Chest pain, unspecified: Secondary | ICD-10-CM | POA: Diagnosis not present

## 2022-08-06 DIAGNOSIS — I10 Essential (primary) hypertension: Secondary | ICD-10-CM

## 2022-08-06 DIAGNOSIS — R000 Tachycardia, unspecified: Secondary | ICD-10-CM | POA: Diagnosis not present

## 2022-08-06 DIAGNOSIS — R0609 Other forms of dyspnea: Secondary | ICD-10-CM | POA: Diagnosis not present

## 2022-08-06 DIAGNOSIS — I2510 Atherosclerotic heart disease of native coronary artery without angina pectoris: Secondary | ICD-10-CM

## 2022-08-06 DIAGNOSIS — R002 Palpitations: Secondary | ICD-10-CM

## 2022-08-06 DIAGNOSIS — E785 Hyperlipidemia, unspecified: Secondary | ICD-10-CM | POA: Diagnosis not present

## 2022-08-06 LAB — D-DIMER, QUANTITATIVE: D-Dimer, Quant: 0.27 ug{FEU}/mL (ref 0.00–0.50)

## 2022-08-06 NOTE — Progress Notes (Signed)
Office Visit    Patient Name: Mary Strong Date of Encounter: 08/06/2022  PCP:  Richardean Chimera, MD   [facility]  Cardiologist:  Nona Dell, MD  Advanced Practice Provider:  Sharlene Dory, NP Electrophysiologist:  None   Chief Complaint    Mary Strong is a 62 y.o. female with a hx of CAD, HLD, T2DM, OSA, palpitations, and HTN, who presents today for chest pain evaluation.   Past Medical History    Past Medical History:  Diagnosis Date   Anxiety    Arthritis    CAD (coronary artery disease)    Heavily calcified vessels, DES to CTO RCA and DES to mid LAD September 2020   Constipation    Depression    Essential hypertension    Fatty liver    GERD (gastroesophageal reflux disease)    History of migraine    History of sleep apnea    Currently not using CPAP   Hypothyroidism    Multiple sclerosis    Neurogenic bladder    Neuropathy    Palpitations    Restless leg syndrome    Skin cancer    Basal cell cancer on forehead, strong family hx of adenocarcinoma   Type 2 diabetes mellitus    Past Surgical History:  Procedure Laterality Date   BIOPSY  11/05/2020   Procedure: BIOPSY;  Surgeon: Dolores Frame, MD;  [facility];  Service: Gastroenterology;;   CESAREAN SECTION     x 2   CHOLECYSTECTOMY     COLONOSCOPY WITH PROPOFOL N/A 11/05/2020   Procedure: COLONOSCOPY WITH PROPOFOL;  Surgeon: Dolores Frame, MD;  [facility]: [facility];  Service: Gastroenterology;  Laterality: N/A;  12:30   CORONARY ATHERECTOMY N/A 01/18/2019   Procedure: CORONARY ATHERECTOMY;  Surgeon: Swaziland, Peter M, MD;  Location: [facility] INVASIVE CV LAB;  Service: Cardiovascular;  Laterality: N/A;   CORONARY CTO INTERVENTION N/A 01/18/2019   Procedure: CORONARY CTO INTERVENTION;  Surgeon: Swaziland, Peter M, MD;  Location: [facility] INVASIVE CV LAB;  Service: Cardiovascular;  Laterality: N/A;   CORONARY STENT INTERVENTION N/A 01/18/2019    Procedure: CORONARY STENT INTERVENTION;  Surgeon: Swaziland, Peter M, MD;  Location: [facility] INVASIVE CV LAB;  Service: Cardiovascular;  Laterality: N/A;   ENDOMETRIAL ABLATION     ESOPHAGOGASTRODUODENOSCOPY (EGD) WITH PROPOFOL N/A 11/05/2020   Procedure: ESOPHAGOGASTRODUODENOSCOPY (EGD) WITH PROPOFOL;  Surgeon: Dolores Frame, MD;  [facility];  Service: Gastroenterology;  Laterality: N/A;   GIVENS CAPSULE STUDY N/A 11/18/2020   Procedure: GIVENS CAPSULE STUDY;  Surgeon: Dolores Frame, MD;  [facility];  Service: Gastroenterology;  Laterality: N/A;  7:30   JOINT REPLACEMENT     LEFT HEART CATH AND CORONARY ANGIOGRAPHY N/A 01/10/2019   Procedure: LEFT HEART CATH AND CORONARY ANGIOGRAPHY;  Surgeon: Lyn Records, MD;  Location: [facility] INVASIVE CV LAB;  Service: Cardiovascular;  Laterality: N/A;   POLYPECTOMY  11/05/2020   Procedure: POLYPECTOMY;  Surgeon: Dolores Frame, MD;  [facility];  Service: Gastroenterology;;   TONSILLECTOMY     TOTAL HIP ARTHROPLASTY Right 12/04/2015   TOTAL HIP ARTHROPLASTY Right 12/04/2015   Procedure: TOTAL HIP ARTHROPLASTY ANTERIOR APPROACH;  Surgeon: Eldred Manges, MD;  Location: [facility] OR;  Service: Orthopedics;  Laterality: Right;   TOTAL HIP ARTHROPLASTY Left 01/24/2016   Procedure: TOTAL HIP ARTHROPLASTY ANTERIOR APPROACH;  Surgeon: Eldred Manges, MD;  Location: [facility] OR;  Service: Orthopedics;  Laterality:  Left;  anterior approach   TUBAL LIGATION      Allergies  Allergies  Allergen Reactions   Shellfish Allergy Anaphylaxis   Ciprofloxacin Hives   Copaxone [Glatiramer Acetate] Hives   Penicillins Hives and Rash    Bad headaches Did it involve swelling of the face/tongue/throat, SOB, or low BP? No Did it involve sudden or severe rash/hives, skin peeling, or any reaction on the inside of your mouth or nose? No Did you need to seek medical attention at a hospital or doctor's office? No When did it last happen?       6 - 7 years If all above answers are "NO", may proceed with cephalosporin use.    Vancomycin Itching    History of Present Illness    Mary Strong is a very pleasant 62 y.o. female with a PMH as mentioned above.   Last seen by Jacolyn Reedy, PA-C on 05/11/2022.  Noticed occasional skipped heartbeats, not frequent.  Overall doing well from a cardiac perspective.  Today she presents for chest pain evaluation.  She states she has had episodes of mid/lower chest pain while walking with increased heart rates, admits to dyspnea on exertion. Has been happening over the past month. After resting, HR returns to normal and her chest pain is relieved. Has attributed her symptoms to GERD. Non-radiating, brief in duration. Denies any other associated symptoms. Denies any syncope, presyncope, dizziness, orthopnea, PND, swelling or significant weight changes, acute bleeding, or claudication.  SH: Former Company secretary, former ED Charity fundraiser at [employer] Reviewed:   The following studies were reviewed today:   EKG:  EKG is ordered today.  The ekg ordered today demonstrates sinus rhythm, 70 bpm, incomplete RBBB, nonspecific T wave abnormality, similar to previous EKGs, no acute ischemic changes.  Echo 07/2021:  1. Left ventricular ejection fraction, by estimation, is 65 to 70%. The  left ventricle has normal function. The left ventricle has no regional  wall motion abnormalities. Left ventricular diastolic parameters are  consistent with Grade I diastolic  dysfunction (impaired relaxation).   2. Right ventricular systolic function is normal. The right ventricular  size is normal.   3. The mitral valve is normal in structure. No evidence of mitral valve  regurgitation. No evidence of mitral stenosis.   4. The aortic valve is normal in structure. Aortic valve regurgitation is  not visualized. No aortic stenosis is present.   5. The inferior vena cava is normal in  size with greater than 50%  respiratory variability, suggesting right atrial pressure of 3 mmHg.  Myoview 07/2021:   The study is normal. The study is low risk.   No ST deviation was noted.   LV perfusion is normal. There is no evidence of ischemia. There is no evidence of infarction. Small apical mild defect compatable with breast attenuation.   Left ventricular function is normal. End diastolic cavity size is normal. End systolic cavity size is normal.  Recent Labs: 07/16/2022: Hemoglobin 13.7; Platelets 200  Recent Lipid Panel    Component Value Date/Time   CHOL  05/10/2010 2350    143        ATP III CLASSIFICATION:  <200     mg/dL   Desirable  606-004  mg/dL   Borderline High  >=599    mg/dL   High          TRIG 774 (H) 05/10/2010 2350   HDL 22 (L) 05/10/2010 2350   CHOLHDL 6.5  05/10/2010 2350   VLDL 52 (H) 05/10/2010 2350   LDLCALC  05/10/2010 2350    69        Total Cholesterol/HDL:CHD Risk Coronary Heart Disease Risk Table                     Men   Women  1/2 Average Risk   3.4   3.3  Average Risk       5.0   4.4  2 X Average Risk   9.6   7.1  3 X Average Risk  23.4   11.0        Use the calculated Patient Ratio above and the CHD Risk Table to determine the patient's CHD Risk.        ATP III CLASSIFICATION (LDL):  <100     mg/dL   Optimal  161-096  mg/dL   Near or Above                    Optimal  130-159  mg/dL   Borderline  045-409  mg/dL   High  >811     mg/dL   Very High    Home Medications   Current Meds  Medication Sig   amLODipine (NORVASC) 2.5 MG tablet TAKE ONE TABLET BY MOUTH DAILY Needs appointment for further refills   aspirin 81 MG tablet Take 81 mg by mouth daily.   B-D 3CC LUER-LOK SYR 25GX1" 25G X 1" 3 ML MISC Inject into the muscle.   BIOTIN PO Take 10,000 mcg by mouth daily.   CLEARLAX 17 GM/SCOOP powder Take 17 g by mouth as needed for mild constipation.   cyanocobalamin (VITAMIN B12) 1000 MCG/ML injection Inject 1000 mcg vitamin B12  intramuscularly every 7 days x 28 days, followed by monthly injections.   diazepam (VALIUM) 5 MG tablet TAKE ONE TABLET BY MOUTH THREE TIMES DAILY AS NEEDED   dicyclomine (BENTYL) 10 MG capsule Take 1 capsule (10 mg total) by mouth every 12 (twelve) hours as needed (abdominal pain).   DULoxetine (CYMBALTA) 60 MG capsule Take 1 capsule (60 mg total) by mouth at bedtime.   folic acid (FOLVITE) 1 MG tablet Take 1 mg by mouth every other day.   gabapentin (NEURONTIN) 600 MG tablet TAKE ONE TABLET BY MOUTH FOUR TIMES DAILY   levothyroxine (SYNTHROID) 112 MCG tablet Take 112 mcg by mouth daily before breakfast.   metFORMIN (GLUCOPHAGE-XR) 500 MG 24 hr tablet Take 1,000 mg by mouth 2 (two) times daily.   metoprolol tartrate (LOPRESSOR) 50 MG tablet TAKE ONE TABLET BY MOUTH TWICE DAILY   mometasone (ELOCON) 0.1 % cream Apply 1 application topically daily as needed (psoriasis).   Needles & Syringes MISC For use to administer vitamin B12 injections at home.   nitroGLYCERIN (NITROSTAT) 0.4 MG SL tablet Place 1 tablet (0.4 mg total) under the tongue every 5 (five) minutes as needed.   ondansetron (ZOFRAN-ODT) 4 MG disintegrating tablet Take 4 mg by mouth every 8 (eight) hours as needed.   pantoprazole (PROTONIX) 40 MG tablet Take 40 mg by mouth daily.    rosuvastatin (CRESTOR) 5 MG tablet Take 5 mg by mouth every other day.     Review of Systems    All other systems reviewed and are otherwise negative except as noted above.  Physical Exam    VS:  BP (!) 142/90 (BP Location: Left Arm, Patient Position: Sitting, Cuff Size: Large)   Pulse 70   Ht  (  1.575 m)   Wt 191 lb 6.4 oz (86.8 kg)   SpO2 99%   BMI 35.01 kg/m  , BMI Body mass index is 35.01 kg/m.  Wt Readings from Last 3 Encounters:  08/06/22 191 lb 6.4 oz (86.8 kg)  07/23/22 190 lb 14.4 oz (86.6 kg)  06/11/22 197 lb 12.8 oz (89.7 kg)     GEN: Well nourished, well developed, in no acute distress. HEENT: normal. Neck: Supple, no JVD,  carotid bruits, or masses. Cardiac: S1/S2, RRR, no murmurs, rubs, or gallops. No clubbing, cyanosis, edema.  Radials/PT 2+ and equal bilaterally.  Respiratory:  Respirations regular and unlabored, clear to auscultation bilaterally. MS: No deformity or atrophy. Skin: Warm and dry, no rash. Neuro:  Strength and sensation are intact. Psych: Normal affect.  Assessment & Plan    Chest pain of uncertain etiology, CAD, DOE Hx of multivessel CAD, s/p DES to CTA RCA and mLAD in 2020. Myoview 07/2021  low risk, negative for ischemia. Admits to several episodes of chest pain - see HPI. Will arrange Myoview to evaluate for any evidence of ischemia.  Discussed risk versus benefits, and she verbalized understanding and is agreeable to proceed.  Will update Echo at this time. Continue aspirin, Lopressor, rosuvastatin, and nitroglycerin. Will refill NG. Heart healthy diet and regular cardiovascular exercise encouraged.   Shared Decision Making/Informed Consent The risks [chest pain, shortness of breath, cardiac arrhythmias, dizziness, blood pressure fluctuations, myocardial infarction, stroke/transient ischemic attack, nausea, vomiting, allergic reaction, radiation exposure, metallic taste sensation and life-threatening complications (estimated to be 1 in 10,000)], benefits (risk stratification, diagnosing coronary artery disease, treatment guidance) and alternatives of a nuclear stress test were discussed in detail with Ms. Bracewell and she agrees to proceed.   2. HTN BP elevated on arrival, improved on recheck. Continue current medication regimen. Heart healthy diet and regular cardiovascular exercise encouraged.   3. HLD Managed by PCP. Continue crestor. Heart healthy diet and regular cardiovascular exercise encouraged.   4. Palpitations, tachycardia Associated with walking, and occurs with CP. Etiology unclear. Arranging ischemic workup as mentioned above - see #1. Low likelihood of PE - will arrange D-dimer  to evaluate. Monitor in 2021 showed SR with occasional episodes of SVT, brief in duration, overall unremarkable. Continue lopressor. At next OV, consider repeating monitor if no improvement in symptoms. Heart healthy diet encouraged.    Disposition: Follow up in 8 week(s) with Nona Dell, MD or APP.  Signed, Sharlene Dory, NP 08/08/2022, 6:21 PM [facility]

## 2022-08-06 NOTE — Patient Instructions (Addendum)
Medication Instructions:  Your physician recommends that you continue on your current medications as directed. Please refer to the Current Medication list given to you today.   Labwork: D Dimer Forestine Na)  Testing/Procedures: Your physician has requested that you have an echocardiogram. Echocardiography is a painless test that uses sound waves to create images of your heart. It provides your doctor with information about the size and shape of your heart and how well your heart's chambers and valves are working. This procedure takes approximately one hour. There are no restrictions for this procedure. Please do NOT wear cologne, perfume, aftershave, or lotions (deodorant is allowed). Please arrive 15 minutes prior to your appointment time.   Your physician has requested that you have a lexiscan myoview. For further information please visit [email or web address]. Please follow instruction sheet, as given.   Follow-Up:  Your physician recommends that you schedule a follow-up appointment in: 8 weeks  Any Other Special Instructions Will Be Listed Below (If Applicable).  If you need a refill on your cardiac medications before your next appointment, please call your pharmacy.

## 2022-08-06 NOTE — Telephone Encounter (Signed)
Checking percert on the following patient for testing scheduled at [facility].   LEXISCAN   08/17/2022

## 2022-08-08 DIAGNOSIS — R000 Tachycardia, unspecified: Secondary | ICD-10-CM | POA: Diagnosis not present

## 2022-08-08 DIAGNOSIS — R079 Chest pain, unspecified: Secondary | ICD-10-CM | POA: Diagnosis not present

## 2022-08-08 DIAGNOSIS — R001 Bradycardia, unspecified: Secondary | ICD-10-CM | POA: Diagnosis not present

## 2022-08-08 MED ORDER — NITROGLYCERIN 0.4 MG SL SUBL
0.4000 mg | SUBLINGUAL_TABLET | SUBLINGUAL | 2 refills | Status: DC | PRN
Start: 2022-08-08 — End: 2024-01-18

## 2022-08-10 ENCOUNTER — Encounter: Payer: Self-pay | Admitting: Neurology

## 2022-08-17 ENCOUNTER — Encounter (HOSPITAL_BASED_OUTPATIENT_CLINIC_OR_DEPARTMENT_OTHER)
Admission: RE | Admit: 2022-08-17 | Discharge: 2022-08-17 | Disposition: A | Discharge status: Home / Self Care | Payer: BC Managed Care – PPO | Source: Ambulatory Visit | Attending: Nurse Practitioner | Admitting: Nurse Practitioner

## 2022-08-17 ENCOUNTER — Ambulatory Visit (HOSPITAL_COMMUNITY)
Admission: RE | Admit: 2022-08-17 | Discharge: 2022-08-17 | Disposition: A | Discharge status: Home / Self Care | Payer: BC Managed Care – PPO | Source: Ambulatory Visit | Attending: Nurse Practitioner | Admitting: Nurse Practitioner

## 2022-08-17 ENCOUNTER — Other Ambulatory Visit: Payer: Self-pay

## 2022-08-17 DIAGNOSIS — R079 Chest pain, unspecified: Secondary | ICD-10-CM

## 2022-08-17 LAB — NM MYOCAR MULTI W/SPECT W/WALL MOTION / EF
LV dias vol: 51 mL (ref 46–106)
LV sys vol: 15 mL
Nuc Stress EF: 71 %
Peak HR: 83 {beats}/min
RATE: 0.4
Rest HR: 69 {beats}/min
Rest Nuclear Isotope Dose: 10.1 mCi
SDS: 3
SRS: 3
SSS: 6
ST Depression (mm): 0 mm
Stress Nuclear Isotope Dose: 28.4 mCi
TID: 1.12

## 2022-08-17 MED ORDER — SODIUM CHLORIDE FLUSH 0.9 % IV SOLN
INTRAVENOUS | Status: AC
  Administered 2022-08-17: 10 mL via INTRAVENOUS
  Filled 2022-08-17: qty 10

## 2022-08-17 MED ORDER — TECHNETIUM TC 99M TETROFOSMIN IV KIT
30.0000 | PACK | Freq: Once | INTRAVENOUS | Status: AC | PRN
  Administered 2022-08-17: 28.4 via INTRAVENOUS

## 2022-08-17 MED ORDER — REGADENOSON 0.4 MG/5ML IV SOLN
INTRAVENOUS | Status: AC
  Administered 2022-08-17: 0.4 mg via INTRAVENOUS
  Filled 2022-08-17: qty 5

## 2022-08-17 MED ORDER — TECHNETIUM TC 99M TETROFOSMIN IV KIT
10.0000 | PACK | Freq: Once | INTRAVENOUS | Status: AC | PRN
  Administered 2022-08-17: 10.1 via INTRAVENOUS

## 2022-08-20 ENCOUNTER — Other Ambulatory Visit: Payer: Self-pay | Admitting: Nurse Practitioner

## 2022-08-20 ENCOUNTER — Telehealth: Payer: Self-pay | Admitting: Nurse Practitioner

## 2022-08-20 ENCOUNTER — Ambulatory Visit: Payer: BC Managed Care – PPO | Attending: Nurse Practitioner

## 2022-08-20 ENCOUNTER — Ambulatory Visit: Payer: BC Managed Care – PPO | Attending: Nurse Practitioner | Admitting: Nurse Practitioner

## 2022-08-20 ENCOUNTER — Encounter: Payer: Self-pay | Admitting: Nurse Practitioner

## 2022-08-20 VITALS — BP 132/84 | HR 74 | Ht 62.0 in | Wt 193.4 lb

## 2022-08-20 DIAGNOSIS — R0609 Other forms of dyspnea: Secondary | ICD-10-CM | POA: Diagnosis not present

## 2022-08-20 DIAGNOSIS — I2510 Atherosclerotic heart disease of native coronary artery without angina pectoris: Secondary | ICD-10-CM

## 2022-08-20 DIAGNOSIS — R002 Palpitations: Secondary | ICD-10-CM

## 2022-08-20 DIAGNOSIS — I10 Essential (primary) hypertension: Secondary | ICD-10-CM | POA: Diagnosis not present

## 2022-08-20 DIAGNOSIS — E785 Hyperlipidemia, unspecified: Secondary | ICD-10-CM | POA: Diagnosis not present

## 2022-08-20 DIAGNOSIS — R079 Chest pain, unspecified: Secondary | ICD-10-CM

## 2022-08-20 DIAGNOSIS — R519 Headache, unspecified: Secondary | ICD-10-CM

## 2022-08-20 DIAGNOSIS — R000 Tachycardia, unspecified: Secondary | ICD-10-CM | POA: Diagnosis not present

## 2022-08-20 MED ORDER — AMLODIPINE BESYLATE 5 MG PO TABS: 5.0000 mg | ORAL_TABLET | Freq: Every day | ORAL | 1 refills | Status: DC

## 2022-08-20 NOTE — Patient Instructions (Signed)
Medication Instructions:  Your physician has recommended you make the following change in your medication:  INCREASE amlodipine to 5 mg once a day Continue all other medications as directed  Labwork: none  Testing/Procedures: ZIO- Long Term Monitor Instructions   Your physician has requested you wear your ZIO patch monitor 14 days.   This is a single patch monitor.  [employer] supplies one patch monitor per enrollment.  Additional stickers are not available.   Please do not apply patch if you will be having a Nuclear Stress Test, Echocardiogram, Cardiac CT, MRI, or Chest Xray during the time frame you would be wearing the monitor. The patch cannot be worn during these tests.  You cannot remove and re-apply the ZIO XT patch monitor.   Your ZIO patch monitor will be sent USPS Priority mail from [facility] directly to your home address. The monitor may also be mailed to a PO BOX if home delivery is not available.   It may take 3-5 days to receive your monitor after you have been enrolled.   Once you have received you monitor, please review enclosed instructions.  Your monitor has already been registered assigning a specific monitor serial # to you.   Applying the monitor   Shave hair from upper left chest.   Hold abrader disc by orange tab.  Rub abrader in 40 strokes over left upper chest as indicated in your monitor instructions.   Clean area with 4 enclosed alcohol pads .  Use all pads to assure are is cleaned thoroughly.  Let dry.   Apply patch as indicated in monitor instructions.  Patch will be place under collarbone on left side of chest with arrow pointing upward.   Rub patch adhesive wings for 2 minutes.Remove white label marked "1".  Remove white label marked "2".  Rub patch adhesive wings for 2 additional minutes.   While looking in a mirror, press and release button in center of patch.  A small green light will flash 3-4 times .  This will be your only indicator the  monitor has been turned on.     Do not shower for the first 24 hours.  You may shower after the first 24 hours.   Press button if you feel a symptom. You will hear a small click.  Record Date, Time and Symptom in the Patient Log Book.   When you are ready to remove patch, follow instructions on last 2 pages of Patient Log Book.  Stick patch monitor onto last page of Patient Log Book.   Place Patient Log Book in [place] box.  Use locking tab on box and tape box closed securely.  The [employer] and [employer] has [employer] on it.  Please place in mailbox as soon as possible.  Your physician should have your test results approximately 7 days after the monitor has been mailed back to [facility].   Call [facility] Customer Care at [phone] if you have questions regarding your ZIO XT patch monitor.  Call them immediately if you see an orange light blinking on your monitor.   If your monitor falls off in less than 4 days contact our Monitor department at [phone].  If your monitor becomes loose or falls off after 4 days call [employer] at [phone] for suggestions on securing your monitor.   Follow-Up:  Your physician recommends that you schedule a follow-up appointment in: Keep upcoming appointment with Sharlene Dory, NP  Any Other Special Instructions Will Be Listed Below (If  Applicable).  If you need a refill on your cardiac medications before your next appointment, please call your pharmacy.

## 2022-08-20 NOTE — Telephone Encounter (Signed)
14 day ZIO XT, enrolled 4/18 ([place])

## 2022-08-20 NOTE — Progress Notes (Signed)
Office Visit    Patient Name: Mary Strong Date of Encounter: 08/20/2022  PCP:  Richardean Chimera, MD   [facility]  Cardiologist:  Nona Dell, MD  Advanced Practice Provider:  Sharlene Dory, NP Electrophysiologist:  None   Chief Complaint    Mary Strong is a very pleasant 62 y.o. female with a hx of CAD, HLD, T2DM, OSA, palpitations, and HTN, who presents today for chest pain evaluation follow-up.   Past Medical History    Past Medical History:  Diagnosis Date   Anxiety    Arthritis    CAD (coronary artery disease)    Heavily calcified vessels, DES to CTO RCA and DES to mid LAD September 2020   Constipation    Depression    Essential hypertension    Fatty liver    GERD (gastroesophageal reflux disease)    History of migraine    History of sleep apnea    Currently not using CPAP   Hypothyroidism    Multiple sclerosis    Neurogenic bladder    Neuropathy    Palpitations    Restless leg syndrome    Skin cancer    Basal cell cancer on forehead, strong family hx of adenocarcinoma   Type 2 diabetes mellitus    Past Surgical History:  Procedure Laterality Date   BIOPSY  11/05/2020   Procedure: BIOPSY;  Surgeon: Dolores Frame, MD;  Location: [facility];  Service: Gastroenterology;;   CESAREAN SECTION     x 2   CHOLECYSTECTOMY     COLONOSCOPY WITH PROPOFOL N/A 11/05/2020   Procedure: COLONOSCOPY WITH PROPOFOL;  Surgeon: Dolores Frame, MD;  Location: [facility];  Service: Gastroenterology;  Laterality: N/A;  12:30   CORONARY ATHERECTOMY N/A 01/18/2019   Procedure: CORONARY ATHERECTOMY;  Surgeon: Swaziland, Peter M, MD;  Location: [facility] INVASIVE CV LAB;  Service: Cardiovascular;  Laterality: N/A;   CORONARY CTO INTERVENTION N/A 01/18/2019   Procedure: CORONARY CTO INTERVENTION;  Surgeon: Swaziland, Peter M, MD;  Location: [facility] INVASIVE CV LAB;  Service: Cardiovascular;  Laterality: N/A;   CORONARY STENT INTERVENTION  N/A 01/18/2019   Procedure: CORONARY STENT INTERVENTION;  Surgeon: Swaziland, Peter M, MD;  Location: [facility] INVASIVE CV LAB;  Service: Cardiovascular;  Laterality: N/A;   ENDOMETRIAL ABLATION     ESOPHAGOGASTRODUODENOSCOPY (EGD) WITH PROPOFOL N/A 11/05/2020   Procedure: ESOPHAGOGASTRODUODENOSCOPY (EGD) WITH PROPOFOL;  Surgeon: Dolores Frame, MD;  Location: [facility];  Service: Gastroenterology;  Laterality: N/A;   GIVENS CAPSULE STUDY N/A 11/18/2020   Procedure: GIVENS CAPSULE STUDY;  Surgeon: Dolores Frame, MD;  Location: [facility];  Service: Gastroenterology;  Laterality: N/A;  7:30   JOINT REPLACEMENT     LEFT HEART CATH AND CORONARY ANGIOGRAPHY N/A 01/10/2019   Procedure: LEFT HEART CATH AND CORONARY ANGIOGRAPHY;  Surgeon: Lyn Records, MD;  Location: [facility] INVASIVE CV LAB;  Service: Cardiovascular;  Laterality: N/A;   POLYPECTOMY  11/05/2020   Procedure: POLYPECTOMY;  Surgeon: Dolores Frame, MD;  Location: [facility];  Service: Gastroenterology;;   TONSILLECTOMY     TOTAL HIP ARTHROPLASTY Right 12/04/2015   TOTAL HIP ARTHROPLASTY Right 12/04/2015   Procedure: TOTAL HIP ARTHROPLASTY ANTERIOR APPROACH;  Surgeon: Eldred Manges, MD;  Location: [facility] OR;  Service: Orthopedics;  Laterality: Right;   TOTAL HIP ARTHROPLASTY Left 01/24/2016   Procedure: TOTAL HIP ARTHROPLASTY ANTERIOR APPROACH;  Surgeon: Eldred Manges, MD;  Location: [facility] OR;  Service:  Orthopedics;  Laterality: Left;  anterior approach   TUBAL LIGATION      Allergies  Allergies  Allergen Reactions   Shellfish Allergy Anaphylaxis   Ciprofloxacin Hives   Copaxone [Glatiramer Acetate] Hives   Penicillins Hives and Rash    Bad headaches Did it involve swelling of the face/tongue/throat, SOB, or low BP? No Did it involve sudden or severe rash/hives, skin peeling, or any reaction on the inside of your mouth or nose? No Did you need to seek medical attention at a hospital or doctor's office? No When did it  last happen?      6 - 7 years If all above answers are "NO", may proceed with cephalosporin use.    Vancomycin Itching    History of Present Illness    Mary Strong is a very pleasant 62 y.o. female with a PMH as mentioned above.   Last seen by Jacolyn Reedy, PA-C on 05/11/2022.  Noticed occasional skipped heartbeats, not frequent.  Overall doing well from a cardiac perspective.  I last saw this patient on April 4,2024 for chest pain evaluation. Noted episodes of mid/lower chest pain while walking with increased heart rates, admitted to dyspnea on exertion over the past month. After resting, HR returned to normal and her chest pain was relieved. Attributed her symptoms to GERD. Non-radiating, brief in duration. Denied any other associated symptoms.   Patient presented to [facility] ED on 08/08/2022 with chest pain, said it was the "worst chest pain of her life." Had radiation to right arm. Took SL NTG with some improvement, took 2 additional tablets, no improvement. EMS did EKG that showed T wave inversions, went to ED for further eval. EKG and trops were negative in ED. Left AMA.   Underwent NST 08/17/2022 that was low risk. Noted small regional of inferolateral ischemia as described, nuclear stress EF normal.   Today she presents for follow-up. She states that same night when she returned home from ED had pain along right side of face/neck, woke her up, turned over in bed, put pressure on side of face, then went back to sleep. Continues to note episodes of CP with HR elevations and with walking, stable over time, has to take breaks, has not taken any NTG tablets recently. Around 3 times per week, gets out of shower and develops CP/SHOB, has to sit on bed, and symptoms go away in 3-5 minutes. Does experience palpitations, checks Apple Watch, but says HR is always well controlled. Notes hx of PVC's. Denies any syncope, presyncope, dizziness, orthopnea, PND, swelling or significant  weight changes, acute bleeding, or claudication.   SH: Former Company secretary, former ED Charity fundraiser at [employer] Studies Reviewed:   The following studies were reviewed today:   EKG:  EKG is not ordered today.    Myoview 08/17/2022:   Findings are consistent with ischemia. The study is low risk.   No ST deviation was noted. The ECG was negative for ischemia.   LV perfusion is abnormal.  Mild breast attenuation noted.  Small, mild intensity, apical to basal inferolateral defect that is partially reversible and consistent with small ischemic territory.   Left ventricular function is normal. Nuclear stress EF: 71 %.   Small region of inferolateral ischemia as described, overall low risk with LVEF 71%.  Echo 07/2021:  1. Left ventricular ejection fraction, by estimation, is 65 to 70%. The  left ventricle has normal function. The left ventricle has no regional  wall motion abnormalities. Left ventricular diastolic parameters are  consistent with Grade I diastolic  dysfunction (impaired relaxation).   2. Right ventricular systolic function is normal. The right ventricular  size is normal.   3. The mitral valve is normal in structure. No evidence of mitral valve  regurgitation. No evidence of mitral stenosis.   4. The aortic valve is normal in structure. Aortic valve regurgitation is  not visualized. No aortic stenosis is present.   5. The inferior vena cava is normal in size with greater than 50%  respiratory variability, suggesting right atrial pressure of 3 mmHg.  Myoview 07/2021:   The study is normal. The study is low risk.   No ST deviation was noted.   LV perfusion is normal. There is no evidence of ischemia. There is no evidence of infarction. Small apical mild defect compatable with breast attenuation.   Left ventricular function is normal. End diastolic cavity size is normal. End systolic cavity size is normal.  Recent Labs: 07/16/2022: Hemoglobin  13.7; Platelets 200  Recent Lipid Panel    Component Value Date/Time   CHOL  05/10/2010 2350    143        ATP III CLASSIFICATION:  <200     mg/dL   Desirable  161-096  mg/dL   Borderline High  >=045    mg/dL   High          TRIG 409 (H) 05/10/2010 2350   HDL 22 (L) 05/10/2010 2350   CHOLHDL 6.5 05/10/2010 2350   VLDL 52 (H) 05/10/2010 2350   LDLCALC  05/10/2010 2350    69        Total Cholesterol/HDL:CHD Risk Coronary Heart Disease Risk Table                     Men   Women  1/2 Average Risk   3.4   3.3  Average Risk       5.0   4.4  2 X Average Risk   9.6   7.1  3 X Average Risk  23.4   11.0        Use the calculated Patient Ratio above and the CHD Risk Table to determine the patient's CHD Risk.        ATP III CLASSIFICATION (LDL):  <100     mg/dL   Optimal  811-914  mg/dL   Near or Above                    Optimal  130-159  mg/dL   Borderline  782-956  mg/dL   High  >213     mg/dL   Very High    Home Medications   Current Meds  Medication Sig   aspirin 81 MG tablet Take 81 mg by mouth daily.   B-D 3CC LUER-LOK SYR 25GX1" 25G X 1" 3 ML MISC Inject into the muscle.   BIOTIN PO Take 10,000 mcg by mouth daily.   CLEARLAX 17 GM/SCOOP powder Take 17 g by mouth as needed for mild constipation.   cyanocobalamin (VITAMIN B12) 1000 MCG/ML injection Inject 1000 mcg vitamin B12 intramuscularly every 7 days x 28 days, followed by monthly injections.   diazepam (VALIUM) 5 MG tablet TAKE ONE TABLET BY MOUTH THREE TIMES DAILY AS NEEDED   dicyclomine (BENTYL) 10 MG capsule Take 1 capsule (10 mg total) by mouth every 12 (twelve) hours as needed (abdominal pain).   DULoxetine (CYMBALTA) 60 MG capsule Take  1 capsule (60 mg total) by mouth at bedtime.   folic acid (FOLVITE) 1 MG tablet Take 1 mg by mouth every other day.   gabapentin (NEURONTIN) 600 MG tablet TAKE ONE TABLET BY MOUTH FOUR TIMES DAILY   levothyroxine (SYNTHROID) 112 MCG tablet Take 112 mcg by mouth daily before  breakfast.   metFORMIN (GLUCOPHAGE-XR) 500 MG 24 hr tablet Take 1,000 mg by mouth 2 (two) times daily.   metoprolol tartrate (LOPRESSOR) 50 MG tablet TAKE ONE TABLET BY MOUTH TWICE DAILY   mometasone (ELOCON) 0.1 % cream Apply 1 application topically daily as needed (psoriasis).   Needles & Syringes MISC For use to administer vitamin B12 injections at home.   nitroGLYCERIN (NITROSTAT) 0.4 MG SL tablet Place 1 tablet (0.4 mg total) under the tongue every 5 (five) minutes as needed.   ondansetron (ZOFRAN-ODT) 4 MG disintegrating tablet Take 4 mg by mouth every 8 (eight) hours as needed.   pantoprazole (PROTONIX) 40 MG tablet Take 40 mg by mouth daily.    rosuvastatin (CRESTOR) 5 MG tablet Take 5 mg by mouth every other day.   amLODipine (NORVASC) 2.5 MG tablet TAKE ONE TABLET BY MOUTH DAILY Needs appointment for further refills    Review of Systems    All other systems reviewed and are otherwise negative except as noted above.  Physical Exam    VS:  BP 132/84 (BP Location: Left Arm, Patient Position: Sitting, Cuff Size: Large)   Pulse 74   Ht 5\' 2"  (1.575 m)   Wt 193 lb 6.4 oz (87.7 kg)   SpO2 97%   BMI 35.37 kg/m  , BMI Body mass index is 35.37 kg/m.  Wt Readings from Last 3 Encounters:  08/20/22 193 lb 6.4 oz (87.7 kg)  08/06/22 191 lb 6.4 oz (86.8 kg)  07/23/22 190 lb 14.4 oz (86.6 kg)     GEN: Obese, 62 y.o. female in no acute distress. HEENT: normal. Neck: Supple, no JVD, carotid bruits, or masses. Cardiac: S1/S2, RRR, no murmurs, rubs, or gallops. No clubbing, cyanosis, edema.  Radials/PT 2+ and equal bilaterally.  Respiratory:  Respirations regular and unlabored, clear to auscultation bilaterally. MS: No deformity or atrophy. Skin: Warm and dry, no rash. Neuro:  Strength and sensation are intact. Psych: Normal affect.  Assessment & Plan    Chest pain of uncertain etiology, CAD, DOE Hx of multivessel CAD, s/p DES to CTA RCA and mLAD in 2020. Myoview 07/2021  low risk,  negative for ischemia. Recent NST showed small regional of inferolateral ischemia, was overall low risk. Admits to several episodes of chest pain - see HPI. Will increase Amlodipine to 5 mg daily. Will update Echo as previously scheduled. Continue aspirin, Lopressor, rosuvastatin, and nitroglycerin PRN. Heart healthy diet and regular cardiovascular exercise encouraged. ED precautions discussed. If no improvement by next OV, consider discussing LHC/cardiac PET.   2. HTN BP stable. Increasing amlodipine as mentioned above. Continue rest of medication regimen. Heart healthy diet and regular cardiovascular exercise encouraged.   3. HLD Managed by PCP. Continue crestor. Heart healthy diet and regular cardiovascular exercise encouraged.   4. Palpitations, tachycardia Continues to note this with activity. Associated with walking, and occurs with CP. Etiology unclear. Recent d-dimer, trops negative. NST low risk. Monitor in 2021 showed SR with occasional episodes of SVT, brief in duration, overall unremarkable. Continue lopressor. Will arrange 14 day Zio monitor to evaluate for any arrhythmias. Heart healthy diet encouraged. ED precautions discussed.   5. Facial pan Limited, one  time occurrence at night. Woke her up from sleep. Etiology unknown. Denies any stroke-like symptoms. Will continue to monitor. ED precautions discussed.   Disposition: Follow up with me as scheduled or sooner if anything changes.   Signed, Sharlene Dory, NP 08/20/2022, 11:40 AM [facility]

## 2022-08-25 DIAGNOSIS — R1013 Epigastric pain: Secondary | ICD-10-CM | POA: Diagnosis not present

## 2022-08-25 DIAGNOSIS — Z6834 Body mass index (BMI) 34.0-34.9, adult: Secondary | ICD-10-CM | POA: Diagnosis not present

## 2022-08-25 DIAGNOSIS — R110 Nausea: Secondary | ICD-10-CM | POA: Diagnosis not present

## 2022-08-26 ENCOUNTER — Telehealth (INDEPENDENT_AMBULATORY_CARE_PROVIDER_SITE_OTHER): Payer: Self-pay

## 2022-08-26 NOTE — Telephone Encounter (Signed)
I called and spoke with the patient and advised that we do recommend going to the Ed,however is she is not planning on going to the ED we could order a CT Scan ASAP. Patient made me aware she has an appointment here tomorrow with Doylene Bode, Np. I advised that if her pain worsens to please go to the ED. Patient states understanding.

## 2022-08-26 NOTE — Telephone Encounter (Signed)
Patient called today states she has a history of Pancreatitis. She started having mid to upper abdominal pain on Saturday 08/22/2022, reports she saw the pa at [facility] yesterday and they had recommended that she report to the Ed. Patient did not want to go to the Ed, so she wanted to call here to see what we recommended. She says [facility] told her her Lipase was elevated and she needed to go to the Ed. Patient denies any fever. She says she was given dicyclomine,but she has yet to take it. I advised that she needed to go to the Ed as her pcp told her yesterday, as there are no available appointment in clinic today and she may need further testing, xrays,blood work Architectural technologist. I told her I would make the Doctor at the hospital aware.

## 2022-08-26 NOTE — Telephone Encounter (Signed)
Agree with this. We can order an ASAP abdominal CT but it may be worth having a thorough evaluation ASAP in the ER.

## 2022-08-27 ENCOUNTER — Ambulatory Visit (INDEPENDENT_AMBULATORY_CARE_PROVIDER_SITE_OTHER): Payer: BC Managed Care – PPO | Admitting: Gastroenterology

## 2022-08-27 ENCOUNTER — Encounter (INDEPENDENT_AMBULATORY_CARE_PROVIDER_SITE_OTHER): Payer: Self-pay | Admitting: Gastroenterology

## 2022-08-27 ENCOUNTER — Ambulatory Visit (HOSPITAL_COMMUNITY)
Admission: RE | Admit: 2022-08-27 | Discharge: 2022-08-27 | Disposition: A | Discharge status: Home / Self Care | Payer: BC Managed Care – PPO | Source: Ambulatory Visit | Attending: Gastroenterology | Admitting: Gastroenterology

## 2022-08-27 VITALS — BP 113/73 | HR 69 | Temp 98.2°F | Ht 62.0 in | Wt 187.0 lb

## 2022-08-27 DIAGNOSIS — K859 Acute pancreatitis without necrosis or infection, unspecified: Secondary | ICD-10-CM | POA: Insufficient documentation

## 2022-08-27 DIAGNOSIS — R109 Unspecified abdominal pain: Secondary | ICD-10-CM | POA: Diagnosis not present

## 2022-08-27 MED ORDER — IOHEXOL 300 MG/ML  SOLN
100.0000 mL | Freq: Once | INTRAMUSCULAR | Status: AC | PRN
  Administered 2022-08-27: 100 mL via INTRAVENOUS

## 2022-08-27 NOTE — Patient Instructions (Addendum)
We will check labs today and get STAT CT ordered for further evaluation For now, please continue with a clear liquid diet and stay well hydrated If you decide you need something for pain please make me aware  If you develop fever, worsening/severe pain, or vomiting that does not subside, you need to proceed to the nearest ER for further evaluation  Follow up 2-3 weeks

## 2022-08-27 NOTE — Progress Notes (Addendum)
Referring Provider: Richardean Chimera, MD Primary Care Physician:  Richardean Chimera, MD Primary GI Physician: Levon Hedger   Chief Complaint  Patient presents with   Pancreatitis    Woke up about 5 days ago with upper abdominal pain, fever one night when pain started.    HPI:   Mary Strong is a 62 y.o. female with past medical history of anxiety, coronary artery disease status post stent placement, depression, hypertension, fatty liver, GERD, multiple sclerosis complicated by neurogenic bladder and neuropathy, IBS, iron deficiency anemia, restless leg syndrome and diabetes type 2  Patient presenting today for concerns for pancreatitis.  Last seen October 2022, at that time, presented episodes of fecal urgency, for which she has avoided eating out of her home. Has not presented fecal soiling but she is fearful it may happen. She mostly eats vegetables and fruits, tries to avoid meat as it causes abdominal pain.  Has an average of 3-4 BMs per day. She has not tried using antidiarrheals as she had an episode of constipation when she took it prior, so she would like to avoid it. Her abdominal pain is better, has only used a few pills of Bentyl since her last prescription.  Patient contacted our office yesterfay stating history of pancreatitis, having mid to upper abd pain on 4/20, saw PA at PCP who recommended she proceed to the ED which she did not do, notes that her lipase level at that time was elevated. She was given dicyclomine but had not taken it, she was advised by our office to also proceed to the ED but she declined and wanted to come in for her OV today for further evaluation.   Present:  Patient reports history of suspected pancreatitis in march, this was her first episode. She reports she went to the ED in [place] and was diagnosed there soley based on Lipase 171 and her symptoms as she declined CT imaging, she notes recent lipase with PCP was in the 200s.  she woke up Saturday with  severe abdominal pain, typically epigastric, sometimes radiate to LUQ/RUQ. Pain is sharp/stabbing at times in LUQ. she felt fine when she went to bed Friday evening. She notes she had some nausea and vomiting as well as diarrhea, but this has improved, she notes that what she vomited up was lunch from the day before that did not appear well digested. She continued to have nausea but just one episode of emesis. pain is not as severe today. She had a fever of 102 Saturday night, no other fevers since then. she has actually had this pain for a few years off and on though worse in march and again over the weekend.   She states she is a Engineer, civil (consulting) and she does not like to go to the ER unless she absolutely has to. Notes she was seen at [facility] earlier this month for chest pain, troponins were normal and patient wanted to go home, she saw cardiology who placed her on a cardiac monitor.   She does not drink alcohol or smoke, denies any new recent medications, she notes history of high cholesterol (triglycerides?), but unsure of when this was last checked. Denies rectal bleeding or melena. No NSAIDs. Does not take any herbal supplements. Denies history of cholelithiasis, GB removal was secondary to cholecystitis. Eating sometimes brings on her pain. She is able to keep PO fluids down currently and pain is manageable. She tells me she has a gene that puts her at risk for  developing an adenocarcinoma. She has no jaundice or pruritus.   Family history: Aunt had stomach cancer Grandmother had pancreatic cancer    Givens capsule study: 11/2020 No presence of any vascular alterations, ulcerations, polyps or any other lesions explain the iron deficiency anemia, possibly related to malabsorption.  Last EGD: 11/05/2020, normal esophagus, multiple polyps, 1 was removed which was consistent with a fundic gland polyp, normal duodenum with negative biopsies for celiac disease Last Colonoscopy: 11/05/2020, normal terminal ileum, 4 polyps  in the transverse and ascending colon (tubular adenomas), 5 mm polyp in the distal rectum (possible hypertrophic papilla), nonbleeding internal hemorrhoids.  Recommend to repeat in 5 years    Past Medical History:  Diagnosis Date   Anxiety    Arthritis    CAD (coronary artery disease)    Heavily calcified vessels, DES to CTO RCA and DES to mid LAD September 2020   Constipation    Depression    Essential hypertension    Fatty liver    GERD (gastroesophageal reflux disease)    History of migraine    History of sleep apnea    Currently not using CPAP   Hypothyroidism    Multiple sclerosis    Neurogenic bladder    Neuropathy    Palpitations    Restless leg syndrome    Skin cancer    Basal cell cancer on forehead, strong family hx of adenocarcinoma   Type 2 diabetes mellitus     Past Surgical History:  Procedure Laterality Date   BIOPSY  11/05/2020   Procedure: BIOPSY;  Surgeon: Dolores Frame, MD;  Location: [facility];  Service: Gastroenterology;;   CESAREAN SECTION     x 2   CHOLECYSTECTOMY     COLONOSCOPY WITH PROPOFOL N/A 11/05/2020   Procedure: COLONOSCOPY WITH PROPOFOL;  Surgeon: Dolores Frame, MD;  Location: [facility];  Service: Gastroenterology;  Laterality: N/A;  12:30   CORONARY ATHERECTOMY N/A 01/18/2019   Procedure: CORONARY ATHERECTOMY;  Surgeon: Swaziland, Peter M, MD;  Location: [facility] INVASIVE CV LAB;  Service: Cardiovascular;  Laterality: N/A;   CORONARY CTO INTERVENTION N/A 01/18/2019   Procedure: CORONARY CTO INTERVENTION;  Surgeon: Swaziland, Peter M, MD;  Location: [facility] INVASIVE CV LAB;  Service: Cardiovascular;  Laterality: N/A;   CORONARY STENT INTERVENTION N/A 01/18/2019   Procedure: CORONARY STENT INTERVENTION;  Surgeon: Swaziland, Peter M, MD;  Location: [facility] INVASIVE CV LAB;  Service: Cardiovascular;  Laterality: N/A;   ENDOMETRIAL ABLATION     ESOPHAGOGASTRODUODENOSCOPY (EGD) WITH PROPOFOL N/A 11/05/2020   Procedure: ESOPHAGOGASTRODUODENOSCOPY  (EGD) WITH PROPOFOL;  Surgeon: Dolores Frame, MD;  [facility]: [facility];  Service: Gastroenterology;  Laterality: N/A;   GIVENS CAPSULE STUDY N/A 11/18/2020   Procedure: GIVENS CAPSULE STUDY;  Surgeon: Dolores Frame, MD;  Location: [facility];  Service: Gastroenterology;  Laterality: N/A;  7:30   JOINT REPLACEMENT     LEFT HEART CATH AND CORONARY ANGIOGRAPHY N/A 01/10/2019   Procedure: LEFT HEART CATH AND CORONARY ANGIOGRAPHY;  Surgeon: Lyn Records, MD;  Location: [facility] INVASIVE CV LAB;  Service: Cardiovascular;  Laterality: N/A;   POLYPECTOMY  11/05/2020   Procedure: POLYPECTOMY;  Surgeon: Dolores Frame, MD;  Location: [facility];  Service: Gastroenterology;;   TONSILLECTOMY     TOTAL HIP ARTHROPLASTY Right 12/04/2015   TOTAL HIP ARTHROPLASTY Right 12/04/2015   Procedure: TOTAL HIP ARTHROPLASTY ANTERIOR APPROACH;  Surgeon: Eldred Manges, MD;  Location: [facility] OR;  Service: Orthopedics;  Laterality: Right;  TOTAL HIP ARTHROPLASTY Left 01/24/2016   Procedure: TOTAL HIP ARTHROPLASTY ANTERIOR APPROACH;  Surgeon: Eldred Manges, MD;  Location: [facility] OR;  Service: Orthopedics;  Laterality: Left;  anterior approach   TUBAL LIGATION      Current Outpatient Medications  Medication Sig Dispense Refill   amLODipine (NORVASC) 5 MG tablet Take 1 tablet (5 mg total) by mouth daily. 90 tablet 1   aspirin 81 MG tablet Take 81 mg by mouth daily.     B-D 3CC LUER-LOK SYR 25GX1" 25G X 1" 3 ML MISC Inject into the muscle.     BIOTIN PO Take 10,000 mcg by mouth daily.     CLEARLAX 17 GM/SCOOP powder Take 17 g by mouth as needed for mild constipation.     cyanocobalamin (VITAMIN B12) 1000 MCG/ML injection Inject 1000 mcg vitamin B12 intramuscularly every 7 days x 28 days, followed by monthly injections. 16 mL 0   diazepam (VALIUM) 5 MG tablet TAKE ONE TABLET BY MOUTH THREE TIMES DAILY AS NEEDED 90 tablet 5   dicyclomine (BENTYL) 10 MG capsule Take 1 capsule (10 mg total) by mouth  every 12 (twelve) hours as needed (abdominal pain). 60 capsule 5   DULoxetine (CYMBALTA) 60 MG capsule Take 1 capsule (60 mg total) by mouth at bedtime. 30 capsule 11   folic acid (FOLVITE) 1 MG tablet Take 1 mg by mouth every other day.     gabapentin (NEURONTIN) 600 MG tablet TAKE ONE TABLET BY MOUTH FOUR TIMES DAILY 120 tablet 11   levothyroxine (SYNTHROID) 112 MCG tablet Take 112 mcg by mouth daily before breakfast.     metFORMIN (GLUCOPHAGE-XR) 500 MG 24 hr tablet Take 1,000 mg by mouth 2 (two) times daily.     metoprolol tartrate (LOPRESSOR) 50 MG tablet TAKE ONE TABLET BY MOUTH TWICE DAILY 180 tablet 3   mometasone (ELOCON) 0.1 % cream Apply 1 application topically daily as needed (psoriasis).     Needles & Syringes MISC For use to administer vitamin B12 injections at home. 16 each 0   nitroGLYCERIN (NITROSTAT) 0.4 MG SL tablet Place 1 tablet (0.4 mg total) under the tongue every 5 (five) minutes as needed. 25 tablet 2   ondansetron (ZOFRAN-ODT) 4 MG disintegrating tablet Take 4 mg by mouth every 8 (eight) hours as needed.     pantoprazole (PROTONIX) 40 MG tablet Take 40 mg by mouth daily.      rosuvastatin (CRESTOR) 5 MG tablet Take 5 mg by mouth every other day.     No current facility-administered medications for this visit.    Allergies as of 08/27/2022 - Review Complete 08/27/2022  Allergen Reaction Noted   Shellfish allergy Anaphylaxis 05/31/2014   Ciprofloxacin Hives 11/20/2015   Copaxone [glatiramer acetate] Hives 05/31/2014   Penicillins Hives and Rash 05/31/2014   Vancomycin Itching 05/31/2014    Family History  Problem Relation Age of Onset   Multiple sclerosis Mother    Arthritis/Rheumatoid Mother    Psoriasis Father    Atrial fibrillation Father    Hypertension Brother     Social History   Socioeconomic History   Marital status: Married    Spouse name: Not on file   Number of children: Not on file   Years of education: Not on file   Highest education  level: Not on file  Occupational History   Not on file  Tobacco Use   Smoking status: Never    Passive exposure: Never   Smokeless tobacco: Never  Vaping Use  Vaping Use: Never used  Substance and Sexual Activity   Alcohol use: Not Currently    Comment: "none since 2014"   Drug use: Not Currently    Comment: none in 6 minths   Sexual activity: Not Currently  Other Topics Concern   Not on file  Social History Narrative   Not on file   Social Determinants of Health   Financial Resource Strain: Low Risk  (10/24/2020)   Overall Financial Resource Strain (CARDIA)    Difficulty of Paying Living Expenses: Not hard at all  Food Insecurity: No Food Insecurity (10/24/2020)   Hunger Vital Sign    Worried About Running Out of Food in the Last Year: Never true    Ran Out of Food in the Last Year: Never true  Transportation Needs: No Transportation Needs (10/24/2020)   PRAPARE - Administrator, Civil Service (Medical): No    Lack of Transportation (Non-Medical): No  Physical Activity: Inactive (10/24/2020)   Exercise Vital Sign    Days of Exercise per Week: 0 days    Minutes of Exercise per Session: 0 min  Stress: No Stress Concern Present (10/24/2020)   [employer] of Occupational Health - Occupational Stress Questionnaire    Feeling of Stress : Only a little  Social Connections: Socially Integrated (10/24/2020)   Social Connection and Isolation Panel [NHANES]    Frequency of Communication with Friends and Family: More than three times a week    Frequency of Social Gatherings with Friends and Family: More than three times a week    Attends Religious Services: More than 4 times per year    Active Member of [employer] or Organizations: Yes    Attends Engineer, structural: More than 4 times per year    Marital Status: Married   Review of systems General: negative for malaise, night sweats, fever, chills, weight loss Neck: Negative for lumps, goiter, pain and  significant neck swelling Resp: Negative for cough, wheezing, dyspnea at rest CV: Negative for chest pain, leg swelling, palpitations, orthopnea GI: denies melena, hematochezia, vomiting, diarrhea, constipation, dysphagia, odyonophagia, early satiety or unintentional weight loss. +nausea +epigastric pain  MSK: Negative for joint pain or swelling, back pain, and muscle pain. Derm: Negative for itching or rash Psych: Denies depression, anxiety, memory loss, confusion. No homicidal or suicidal ideation.  Heme: Negative for prolonged bleeding, bruising easily, and swollen nodes. Endocrine: Negative for cold or heat intolerance, polyuria, polydipsia and goiter. Neuro: negative for tremor, gait imbalance, syncope and seizures. The remainder of the review of systems is noncontributory.  Physical Exam: There were no vitals taken for this visit. General:   Alert and oriented. No distress noted. Pleasant and cooperative.  Head:  Normocephalic and atraumatic. Eyes:  Conjuctiva clear without scleral icterus. Mouth:  Oral mucosa pink and moist. Good dentition. No lesions. Heart: Normal rate and rhythm, s1 and s2 heart sounds present.  Lungs: Clear lung sounds in all lobes. Respirations equal and unlabored. Abdomen:  +BS, soft, and non-distended. TTP of epigastric region as well as RUQ and LUQ. No rebound or guarding. No HSM or masses noted. Derm: No palmar erythema or jaundice Msk:  Symmetrical without gross deformities. Normal posture. Extremities:  Without edema. Neurologic:  Alert and  oriented x4 Psych:  Alert and cooperative. Normal mood and affect.  Invalid input(s): "6 MONTHS"   ASSESSMENT: Mary Strong is a 62 y.o. female presenting today for epigastric pain and nausea, suspected pancreatitis.   Epigastric pain with  nausea that began Saturday, notably with similar symptoms in march diagnosed with pancreatitis at an outside facility though no imaging done at that time. Her presentation  is compelling for acute pancreatitis in setting of her symptoms and reported lipase in the 200s with PCP over the past few days. At this time she is keep PO fluids down, she feels pain is manageable without pain meds, no further incidence of fever since Saturday night.  My concern at this point is if this is a repeat episode of acute pancreatitis, etiology is not clear. She does not drink or smoke, no new medications or herbal supplements, low suspicion for gallstone pancreatitis in absence of GB and no history of cholelithiasis, presentation not suggestive of any obstruction as she is without jaundice. She has a family history of pancreatic cancer in her grandmother. We will obtain a stat CT of her abdomen, as well as check HFP, CBC, will also check triglycerides and IgG4 for further evaluation. She should maintain clear liquid diet. She is aware that if she develops severe/worsening pain, vomiting/intolerance to POs or fevers, she needs to proceed to the nearest ER for further evaluation. For now I feel she is stable for outpatient management.    PLAN:  Triglycerides, HFP, CBC, IGG4 2. CT A/P with contrast STAT  3. Clear liquids 4. ER precautions  5. Obtain Lipase results from PCP  All questions were answered, patient verbalized understanding and is in agreement with plan as outlined above.    Follow Up: 2-3 weeks   Georga Stys L. Jeanmarie Hubert, MSN, APRN, AGNP-C Adult-Gerontology Nurse Practitioner [facility]  I have reviewed the note and agree with the APP's assessment as described in this progress note  Katrinka Blazing, MD Gastroenterology and Hepatology [facility] Gastroenterology

## 2022-08-27 NOTE — H&P (View-Only) (Signed)
 Referring Provider: Daniel, Terry G, MD Primary Care Physician:  Daniel, Terry G, MD Primary GI Physician: Castaneda   Chief Complaint  Patient presents with   Pancreatitis    Woke up about 5 days ago with upper abdominal pain, fever one night when pain started.    HPI:   Mary Strong is a 62 y.o. female with past medical history of anxiety, coronary artery disease status post stent placement, depression, hypertension, fatty liver, GERD, multiple sclerosis complicated by neurogenic bladder and neuropathy, IBS, iron deficiency anemia, restless leg syndrome and diabetes type 2  Patient presenting today for concerns for pancreatitis.  Last seen October 2022, at that time, presented episodes of fecal urgency, for which she has avoided eating out of her home. Has not presented fecal soiling but she is fearful it may happen. She mostly eats vegetables and fruits, tries to avoid meat as it causes abdominal pain.  Has an average of 3-4 BMs per day. She has not tried using antidiarrheals as she had an episode of constipation when she took it prior, so she would like to avoid it. Her abdominal pain is better, has only used a few pills of Bentyl since her last prescription.  Patient contacted our office yesterfay stating history of pancreatitis, having mid to upper abd pain on 4/20, saw PA at PCP who recommended she proceed to the ED which she did not do, notes that her lipase level at that time was elevated. She was given dicyclomine but had not taken it, she was advised by our office to also proceed to the ED but she declined and wanted to come in for her OV today for further evaluation.   Present:  Patient reports history of suspected pancreatitis in march, this was her first episode. She reports she went to the ED in [place] and was diagnosed there soley based on Lipase 171 and her symptoms as she declined CT imaging, she notes recent lipase with PCP was in the 200s.  she woke up Saturday with  severe abdominal pain, typically epigastric, sometimes radiate to LUQ/RUQ. Pain is sharp/stabbing at times in LUQ. she felt fine when she went to bed Friday evening. She notes she had some nausea and vomiting as well as diarrhea, but this has improved, she notes that what she vomited up was lunch from the day before that did not appear well digested. She continued to have nausea but just one episode of emesis. pain is not as severe today. She had a fever of 102 Saturday night, no other fevers since then. she has actually had this pain for a few years off and on though worse in march and again over the weekend.   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 Notes she was seen at [facility] earlier this month for chest pain, troponins were normal and patient wanted to go home, she saw cardiology who placed her on a cardiac monitor.   She does not drink alcohol or smoke, denies any new recent medications, she notes history of high cholesterol (triglycerides?), but unsure of when this was last checked. Denies rectal bleeding or melena. No NSAIDs. Does not take any herbal supplements. Denies history of cholelithiasis, GB removal was secondary to cholecystitis. Eating sometimes brings on her pain. She is able to keep PO fluids down currently and pain is manageable. She tells me she has a gene that puts her at risk for   developing an adenocarcinoma. She has no jaundice or pruritus.   Family history: Aunt had stomach cancer Grandmother had pancreatic cancer    Givens capsule study: 11/2020 No presence of any vascular alterations, ulcerations, polyps or any other lesions explain the iron deficiency anemia, possibly related to malabsorption.  Last EGD: 11/05/2020, normal esophagus, multiple polyps, 1 was removed which was consistent with a fundic gland polyp, normal duodenum with negative biopsies for celiac disease Last Colonoscopy: 11/05/2020, normal terminal ileum, 4 polyps  in the transverse and ascending colon (tubular adenomas), 5 mm polyp in the distal rectum (possible hypertrophic papilla), nonbleeding internal hemorrhoids.  Recommend to repeat in 5 years    Past Medical History:  Diagnosis Date   Anxiety    Arthritis    CAD (coronary artery disease)    Heavily calcified vessels, DES to CTO RCA and DES to mid LAD September 2020   Constipation    Depression    Essential hypertension    Fatty liver    GERD (gastroesophageal reflux disease)    History of migraine    History of sleep apnea    Currently not using CPAP   Hypothyroidism    Multiple sclerosis    Neurogenic bladder    Neuropathy    Palpitations    Restless leg syndrome    Skin cancer    Basal cell cancer on forehead, strong family hx of adenocarcinoma   Type 2 diabetes mellitus     Past Surgical History:  Procedure Laterality Date   BIOPSY  11/05/2020   Procedure: BIOPSY;  Surgeon: Castaneda Mayorga, Daniel, MD;  [facility]: [facility];  Service: Gastroenterology;;   CESAREAN SECTION     x 2   CHOLECYSTECTOMY     COLONOSCOPY WITH PROPOFOL N/A 11/05/2020   Procedure: COLONOSCOPY WITH PROPOFOL;  Surgeon: Castaneda Mayorga, Daniel, MD;  [facility]: [facility];  Service: Gastroenterology;  Laterality: N/A;  12:30   CORONARY ATHERECTOMY N/A 01/18/2019   Procedure: CORONARY ATHERECTOMY;  Surgeon: Jordan, Peter M, MD;  [facility]: [facility];  Service: Cardiovascular;  Laterality: N/A;   CORONARY CTO INTERVENTION N/A 01/18/2019   Procedure: CORONARY CTO INTERVENTION;  Surgeon: Jordan, Peter M, MD;  [facility]: [facility];  Service: Cardiovascular;  Laterality: N/A;   CORONARY STENT INTERVENTION N/A 01/18/2019   Procedure: CORONARY STENT INTERVENTION;  Surgeon: Jordan, Peter M, MD;  [facility];  Service: Cardiovascular;  Laterality: N/A;   ENDOMETRIAL ABLATION     ESOPHAGOGASTRODUODENOSCOPY (EGD) WITH PROPOFOL N/A 11/05/2020   Procedure: ESOPHAGOGASTRODUODENOSCOPY  (EGD) WITH PROPOFOL;  Surgeon: Castaneda Mayorga, Daniel, MD;  [facility];  Service: Gastroenterology;  Laterality: N/A;   GIVENS CAPSULE STUDY N/A 11/18/2020   Procedure: GIVENS CAPSULE STUDY;  Surgeon: Castaneda Mayorga, Daniel, MD;  [facility];  Service: Gastroenterology;  Laterality: N/A;  7:30   JOINT REPLACEMENT     LEFT HEART CATH AND CORONARY ANGIOGRAPHY N/A 01/10/2019   Procedure: LEFT HEART CATH AND CORONARY ANGIOGRAPHY;  Surgeon: Smith, Henry W, MD;  [facility];  Service: Cardiovascular;  Laterality: N/A;   POLYPECTOMY  11/05/2020   Procedure: POLYPECTOMY;  Surgeon: Castaneda Mayorga, Daniel, MD;  [facility];  Service: Gastroenterology;;   TONSILLECTOMY     TOTAL HIP ARTHROPLASTY Right 12/04/2015   TOTAL HIP ARTHROPLASTY Right 12/04/2015   Procedure: TOTAL HIP ARTHROPLASTY ANTERIOR APPROACH;  Surgeon: Mark C Yates, MD;  Location: [facility];  Service: Orthopedics;  Laterality: Right;     TOTAL HIP ARTHROPLASTY Left 01/24/2016   Procedure: TOTAL HIP ARTHROPLASTY ANTERIOR APPROACH;  Surgeon: Mark C Yates, MD;  Location: [facility];  Service: Orthopedics;  Laterality: Left;  anterior approach   TUBAL LIGATION      Current Outpatient Medications  Medication Sig Dispense Refill   amLODipine (NORVASC) 5 MG tablet Take 1 tablet (5 mg total) by mouth daily. 90 tablet 1   aspirin 81 MG tablet Take 81 mg by mouth daily.     B-D 3CC LUER-LOK SYR 25GX1" 25G X 1" 3 ML MISC Inject into the muscle.     BIOTIN PO Take 10,000 mcg by mouth daily.     CLEARLAX 17 GM/SCOOP powder Take 17 g by mouth as needed for mild constipation.     cyanocobalamin (VITAMIN B12) 1000 MCG/ML injection Inject 1000 mcg vitamin B12 intramuscularly every 7 days x 28 days, followed by monthly injections. 16 mL 0   diazepam (VALIUM) 5 MG tablet TAKE ONE TABLET BY MOUTH THREE TIMES DAILY AS NEEDED 90 tablet 5   dicyclomine (BENTYL) 10 MG capsule Take 1 capsule (10 mg total) by mouth  every 12 (twelve) hours as needed (abdominal pain). 60 capsule 5   DULoxetine (CYMBALTA) 60 MG capsule Take 1 capsule (60 mg total) by mouth at bedtime. 30 capsule 11   folic acid (FOLVITE) 1 MG tablet Take 1 mg by mouth every other day.     gabapentin (NEURONTIN) 600 MG tablet TAKE ONE TABLET BY MOUTH FOUR TIMES DAILY 120 tablet 11   levothyroxine (SYNTHROID) 112 MCG tablet Take 112 mcg by mouth daily before breakfast.     metFORMIN (GLUCOPHAGE-XR) 500 MG 24 hr tablet Take 1,000 mg by mouth 2 (two) times daily.     metoprolol tartrate (LOPRESSOR) 50 MG tablet TAKE ONE TABLET BY MOUTH TWICE DAILY 180 tablet 3   mometasone (ELOCON) 0.1 % cream Apply 1 application topically daily as needed (psoriasis).     Needles & Syringes MISC For use to administer vitamin B12 injections at home. 16 each 0   nitroGLYCERIN (NITROSTAT) 0.4 MG SL tablet Place 1 tablet (0.4 mg total) under the tongue every 5 (five) minutes as needed. 25 tablet 2   ondansetron (ZOFRAN-ODT) 4 MG disintegrating tablet Take 4 mg by mouth every 8 (eight) hours as needed.     pantoprazole (PROTONIX) 40 MG tablet Take 40 mg by mouth daily.      rosuvastatin (CRESTOR) 5 MG tablet Take 5 mg by mouth every other day.     No current facility-administered medications for this visit.    Allergies as of 08/27/2022 - Review Complete 08/27/2022  Allergen Reaction Noted   Shellfish allergy Anaphylaxis 05/31/2014   Ciprofloxacin Hives 11/20/2015   Copaxone [glatiramer acetate] Hives 05/31/2014   Penicillins Hives and Rash 05/31/2014   Vancomycin Itching 05/31/2014    Family History  Problem Relation Age of Onset   Multiple sclerosis Mother    Arthritis/Rheumatoid Mother    Psoriasis Father    Atrial fibrillation Father    Hypertension Brother     Social History   Socioeconomic History   Marital status: Married    Spouse name: Not on file   Number of children: Not on file   Years of education: Not on file   Highest education  level: Not on file  Occupational History   Not on file  Tobacco Use   Smoking status: Never    Passive exposure: Never   Smokeless tobacco: Never  Vaping Use     Vaping Use: Never used  Substance and Sexual Activity   Alcohol use: Not Currently    Comment: "none since 2014"   Drug use: Not Currently    Comment: none in 6 minths   Sexual activity: Not Currently  Other Topics Concern   Not on file  Social History Narrative   Not on file   Social Determinants of Health   Financial Resource Strain: Low Risk  (10/24/2020)   Overall Financial Resource Strain (CARDIA)    Difficulty of Paying Living Expenses: Not hard at all  Food Insecurity: No Food Insecurity (10/24/2020)   Hunger Vital Sign    Worried About Running Out of Food in the Last Year: Never true    Ran Out of Food in the Last Year: Never true  Transportation Needs: No Transportation Needs (10/24/2020)   PRAPARE - Transportation    Lack of Transportation (Medical): No    Lack of Transportation (Non-Medical): No  Physical Activity: Inactive (10/24/2020)   Exercise Vital Sign    Days of Exercise per Week: 0 days    Minutes of Exercise per Session: 0 min  Stress: No Stress Concern Present (10/24/2020)   [employer] - Occupational Stress Questionnaire    Feeling of Stress : Only a little  Social Connections: Socially Integrated (10/24/2020)   Social Connection and Isolation Panel [NHANES]    Frequency of Communication with Friends and Family: More than three times a week    Frequency of Social Gatherings with Friends and Family: More than three times a week    Attends Religious Services: More than 4 times per year    Active Member of Clubs or Organizations: Yes    Attends Club or Organization Meetings: More than 4 times per year    Marital Status: Married   Review of systems General: negative for malaise, night sweats, fever, chills, weight loss Neck: Negative for lumps, goiter, pain and  significant neck swelling Resp: Negative for cough, wheezing, dyspnea at rest CV: Negative for chest pain, leg swelling, palpitations, orthopnea GI: denies melena, hematochezia, vomiting, diarrhea, constipation, dysphagia, odyonophagia, early satiety or unintentional weight loss. +nausea +epigastric pain  MSK: Negative for joint pain or swelling, back pain, and muscle pain. Derm: Negative for itching or rash Psych: Denies depression, anxiety, memory loss, confusion. No homicidal or suicidal ideation.  Heme: Negative for prolonged bleeding, bruising easily, and swollen nodes. Endocrine: Negative for cold or heat intolerance, polyuria, polydipsia and goiter. Neuro: negative for tremor, gait imbalance, syncope and seizures. The remainder of the review of systems is noncontributory.  Physical Exam: There were no vitals taken for this visit. General:   Alert and oriented. No distress noted. Pleasant and cooperative.  Head:  Normocephalic and atraumatic. Eyes:  Conjuctiva clear without scleral icterus. Mouth:  Oral mucosa pink and moist. Good dentition. No lesions. Heart: Normal rate and rhythm, s1 and s2 heart sounds present.  Lungs: Clear lung sounds in all lobes. Respirations equal and unlabored. Abdomen:  +BS, soft, and non-distended. TTP of epigastric region as well as RUQ and LUQ. No rebound or guarding. No HSM or masses noted. Derm: No palmar erythema or jaundice Msk:  Symmetrical without gross deformities. Normal posture. Extremities:  Without edema. Neurologic:  Alert and  oriented x4 Psych:  Alert and cooperative. Normal mood and affect.  Invalid input(s): "6 MONTHS"   ASSESSMENT: Mary Strong is a 62 y.o. female presenting today for epigastric pain and nausea, suspected pancreatitis.   Epigastric pain with   nausea that began Saturday, notably with similar symptoms in march diagnosed with pancreatitis at an outside facility though no imaging done at that time. Her presentation  is compelling for acute pancreatitis in setting of her symptoms and reported lipase in the 200s with PCP over the past few days. At this time she is keep PO fluids down, she feels pain is manageable without pain meds, no further incidence of fever since Saturday night.  My concern at this point is if this is a repeat episode of acute pancreatitis, etiology is not clear. She does not drink or smoke, no new medications or herbal supplements, low suspicion for gallstone pancreatitis in absence of GB and no history of cholelithiasis, presentation not suggestive of any obstruction as she is without jaundice. She has a family history of pancreatic cancer in her grandmother. We will obtain a stat CT of her abdomen, as well as check HFP, CBC, will also check triglycerides and IgG4 for further evaluation. She should maintain clear liquid diet. She is aware that if she develops severe/worsening pain, vomiting/intolerance to POs or fevers, she needs to proceed to the nearest ER for further evaluation. For now I feel she is stable for outpatient management.    PLAN:  Triglycerides, HFP, CBC, IGG4 2. CT A/P with contrast STAT  3. Clear liquids 4. ER precautions  5. Obtain Lipase results from PCP  All questions were answered, patient verbalized understanding and is in agreement with plan as outlined above.    Follow Up: 2-3 weeks   Kema Santaella L. Zora Glendenning, MSN, APRN, AGNP-C Adult-Gerontology Nurse Practitioner  [facility]  I have reviewed the note and agree with the APP's assessment as described in this progress note  Daniel Castaneda, MD Gastroenterology and Hepatology [facility] 

## 2022-08-28 ENCOUNTER — Telehealth (INDEPENDENT_AMBULATORY_CARE_PROVIDER_SITE_OTHER): Payer: Self-pay | Admitting: Gastroenterology

## 2022-08-28 NOTE — Telephone Encounter (Signed)
EGD scheduled per result note. See result note

## 2022-08-31 LAB — CBC
HCT: 38.5 % (ref 35.0–45.0)
Hemoglobin: 13.5 g/dL (ref 11.7–15.5)
MCH: 29.4 pg (ref 27.0–33.0)
MCHC: 35.1 g/dL (ref 32.0–36.0)
MCV: 83.9 fL (ref 80.0–100.0)
MPV: 12.3 fL (ref 7.5–12.5)
Platelets: 172 10*3/uL (ref 140–400)
RBC: 4.59 10*6/uL (ref 3.80–5.10)
RDW: 15 % (ref 11.0–15.0)
WBC: 5.8 10*3/uL (ref 3.8–10.8)

## 2022-08-31 LAB — HEPATIC FUNCTION PANEL
AG Ratio: 1.6 (calc) (ref 1.0–2.5)
ALT: 24 U/L (ref 6–29)
AST: 26 U/L (ref 10–35)
Albumin: 3.7 g/dL (ref 3.6–5.1)
Alkaline phosphatase (APISO): 47 U/L (ref 37–153)
Bilirubin, Direct: 0.1 mg/dL (ref 0.0–0.2)
Globulin: 2.3 g/dL (calc) (ref 1.9–3.7)
Indirect Bilirubin: 0.3 mg/dL (calc) (ref 0.2–1.2)
Total Bilirubin: 0.4 mg/dL (ref 0.2–1.2)
Total Protein: 6 g/dL — ABNORMAL LOW (ref 6.1–8.1)

## 2022-08-31 LAB — IGG SUBCLASS 4: IgG Subclass 4: 15.7 mg/dL (ref 4.0–86.0)

## 2022-08-31 LAB — TRIGLYCERIDES: Triglycerides: 295 mg/dL — ABNORMAL HIGH (ref <150)

## 2022-09-01 ENCOUNTER — Ambulatory Visit: Payer: BC Managed Care – PPO | Attending: Nurse Practitioner

## 2022-09-01 DIAGNOSIS — R0609 Other forms of dyspnea: Secondary | ICD-10-CM | POA: Diagnosis not present

## 2022-09-01 LAB — ECHOCARDIOGRAM COMPLETE
AR max vel: 1.41 cm2
AV Area VTI: 1.5 cm2
AV Area mean vel: 1.53 cm2
AV Mean grad: 5 mmHg
AV Peak grad: 9.4 mmHg
Ao pk vel: 1.53 m/s
Area-P 1/2: 3.3 cm2
Calc EF: 63.7 %
MV M vel: 2.51 m/s
MV Peak grad: 25.1 mmHg
S' Lateral: 2.4 cm
Single Plane A2C EF: 66.2 %
Single Plane A4C EF: 62.2 %

## 2022-09-14 ENCOUNTER — Telehealth (INDEPENDENT_AMBULATORY_CARE_PROVIDER_SITE_OTHER): Payer: Self-pay | Admitting: *Deleted

## 2022-09-14 NOTE — Telephone Encounter (Signed)
Pt has appt for follow up on Thursday 5/16 with chelsea and EGD on 5/17. Per chelsea can cancel appt for Thursday and schedule follow up after EGD. Pt is aware Thursday appt will be canceled and she will receive another appt.

## 2022-09-17 ENCOUNTER — Ambulatory Visit (INDEPENDENT_AMBULATORY_CARE_PROVIDER_SITE_OTHER): Payer: BC Managed Care – PPO | Admitting: Gastroenterology

## 2022-09-18 ENCOUNTER — Ambulatory Visit (HOSPITAL_COMMUNITY): Payer: BC Managed Care – PPO | Admitting: Anesthesiology

## 2022-09-18 ENCOUNTER — Ambulatory Visit (HOSPITAL_COMMUNITY)
Admission: RE | Admit: 2022-09-18 | Discharge: 2022-09-18 | Disposition: A | Discharge status: Home / Self Care | Payer: BC Managed Care – PPO | Attending: Gastroenterology | Admitting: Gastroenterology

## 2022-09-18 ENCOUNTER — Encounter (HOSPITAL_COMMUNITY): Payer: Self-pay | Admitting: Gastroenterology

## 2022-09-18 ENCOUNTER — Encounter (HOSPITAL_COMMUNITY)
Admission: RE | Disposition: A | Discharge status: Home / Self Care | Payer: Self-pay | Source: Home / Self Care | Attending: Gastroenterology

## 2022-09-18 ENCOUNTER — Other Ambulatory Visit: Payer: Self-pay

## 2022-09-18 DIAGNOSIS — I25119 Atherosclerotic heart disease of native coronary artery with unspecified angina pectoris: Secondary | ICD-10-CM | POA: Diagnosis not present

## 2022-09-18 DIAGNOSIS — Z800 Family history of malignant neoplasm of digestive organs: Secondary | ICD-10-CM | POA: Diagnosis not present

## 2022-09-18 DIAGNOSIS — I10 Essential (primary) hypertension: Secondary | ICD-10-CM | POA: Diagnosis not present

## 2022-09-18 DIAGNOSIS — R1013 Epigastric pain: Secondary | ICD-10-CM | POA: Insufficient documentation

## 2022-09-18 DIAGNOSIS — R110 Nausea: Secondary | ICD-10-CM

## 2022-09-18 DIAGNOSIS — K317 Polyp of stomach and duodenum: Secondary | ICD-10-CM | POA: Diagnosis not present

## 2022-09-18 HISTORY — PX: ESOPHAGOGASTRODUODENOSCOPY (EGD) WITH PROPOFOL: SHX5813

## 2022-09-18 HISTORY — PX: POLYPECTOMY: SHX5525

## 2022-09-18 HISTORY — PX: BIOPSY: SHX5522

## 2022-09-18 LAB — GLUCOSE, CAPILLARY: Glucose-Capillary: 206 mg/dL — ABNORMAL HIGH (ref 70–99)

## 2022-09-18 SURGERY — ESOPHAGOGASTRODUODENOSCOPY (EGD) WITH PROPOFOL
Anesthesia: General

## 2022-09-18 MED ORDER — PROPOFOL 10 MG/ML IV BOLUS
INTRAVENOUS | Status: DC | PRN
  Administered 2022-09-18: 100 mg via INTRAVENOUS

## 2022-09-18 MED ORDER — PROPOFOL 500 MG/50ML IV EMUL
INTRAVENOUS | Status: AC
  Filled 2022-09-18: qty 50

## 2022-09-18 MED ORDER — LIDOCAINE HCL (PF) 2 % IJ SOLN
INTRAMUSCULAR | Status: DC | PRN
  Administered 2022-09-18: 50 mg via INTRADERMAL

## 2022-09-18 MED ORDER — PANTOPRAZOLE SODIUM 40 MG PO TBEC: DELAYED_RELEASE_TABLET | ORAL | 0 refills | Status: DC

## 2022-09-18 MED ORDER — PROPOFOL 500 MG/50ML IV EMUL
INTRAVENOUS | Status: DC | PRN
  Administered 2022-09-18: 180 ug/kg/min via INTRAVENOUS

## 2022-09-18 MED ORDER — LACTATED RINGERS IV SOLN
INTRAVENOUS | Status: DC
  Administered 2022-09-18: 1000 mL via INTRAVENOUS

## 2022-09-18 NOTE — Anesthesia Procedure Notes (Signed)
Procedure Name: General with mask airway Date/Time: 09/18/2022 10:45 AM  Performed by: Cy Blamer, CRNAPre-anesthesia Checklist: Patient identified, Emergency Drugs available, Suction available, Patient being monitored and Timeout performed Oxygen Delivery Method: Nasal cannula Placement Confirmation: positive ETCO2 Dental Injury: Teeth and Oropharynx as per pre-operative assessment

## 2022-09-18 NOTE — Interval H&P Note (Signed)
History and Physical Interval Note:  09/18/2022 10:07 AM  Mary Strong  has presented today for surgery, with the diagnosis of epigastric pain, nausea.  The various methods of treatment have been discussed with the patient and family. After consideration of risks, benefits and other options for treatment, the patient has consented to  Procedure(s) with comments: ESOPHAGOGASTRODUODENOSCOPY (EGD) WITH PROPOFOL (N/A) - 10:30am;ASA 2 as a surgical intervention.  The patient's history has been reviewed, patient examined, no change in status, stable for surgery.  I have reviewed the patient's chart and labs.  Questions were answered to the patient's satisfaction.     Katrinka Blazing Mayorga

## 2022-09-18 NOTE — Anesthesia Preprocedure Evaluation (Addendum)
Anesthesia Evaluation  Patient identified by MRN, date of birth, ID band Patient awake    Reviewed: Allergy & Precautions, H&P , NPO status , Patient's Chart, lab work & pertinent test results, reviewed documented beta blocker date and time   Airway Mallampati: II  TM Distance: >3 FB Neck ROM: Full    Dental  (+) Dental Advisory Given, Teeth Intact   Pulmonary neg pulmonary ROS   Pulmonary exam normal breath sounds clear to auscultation       Cardiovascular Exercise Tolerance: Good hypertension, Pt. on medications and Pt. on home beta blockers + angina  + CAD, + Past MI and + Cardiac Stents (2018)   Rhythm:Irregular Rate:Normal  PVCs  17-Aug-2022 11:40:05 [facility] ROUTINE RECORD [birth year] (61 yr) Female Caucasian Vent. rate 69 BPM PR interval 156 ms QRS duration 122 ms QT/QTcB 448/480 ms P-R-T axes 46 -21 8 Normal sinus rhythm Non-specific intra-ventricular conduction delay Borderline ECG When compared with ECG of 27-Jul-2021 10:00, PREVIOUS ECG IS PRESENT   Neuro/Psych  PSYCHIATRIC DISORDERS Anxiety Depression     Neuromuscular disease    GI/Hepatic Neg liver ROS,GERD  ,,  Endo/Other  diabetes, Well Controlled, Type 2, Oral Hypoglycemic AgentsHypothyroidism    Renal/GU negative Renal ROS  negative genitourinary   Musculoskeletal  (+) Arthritis , Osteoarthritis,    Abdominal   Peds negative pediatric ROS (+)  Hematology  (+) Blood dyscrasia, anemia   Anesthesia Other Findings   Reproductive/Obstetrics negative OB ROS                             Anesthesia Physical Anesthesia Plan  ASA: 3  Anesthesia Plan: General   Post-op Pain Management: Minimal or no pain anticipated   Induction: Intravenous  PONV Risk Score and Plan: 1 and Propofol infusion  Airway Management Planned: Nasal Cannula and Natural Airway  Additional Equipment:   Intra-op  Plan:   Post-operative Plan:   Informed Consent: I have reviewed the patients History and Physical, chart, labs and discussed the procedure including the risks, benefits and alternatives for the proposed anesthesia with the patient or authorized representative who has indicated his/her understanding and acceptance.     Dental advisory given  Plan Discussed with: CRNA and Surgeon  Anesthesia Plan Comments:        Anesthesia Quick Evaluation

## 2022-09-18 NOTE — Discharge Instructions (Addendum)
You are being discharged to home.  Resume your previous diet.  We are waiting for your pathology results.  Take Protonix (pantoprazole) 40 mg by mouth twice a day for two months, then go down to daily dosing.  Try taking FDGard as needed for abdominal pain.

## 2022-09-18 NOTE — Anesthesia Postprocedure Evaluation (Signed)
Anesthesia Post Note  Patient: GENENE WYLES  Procedure(s) Performed: ESOPHAGOGASTRODUODENOSCOPY (EGD) WITH PROPOFOL BIOPSY POLYPECTOMY  Patient location during evaluation: Phase II Anesthesia Type: General Level of consciousness: awake and alert and oriented Pain management: pain level controlled Vital Signs Assessment: post-procedure vital signs reviewed and stable Respiratory status: spontaneous breathing, nonlabored ventilation and respiratory function stable Cardiovascular status: blood pressure returned to baseline and stable Postop Assessment: no apparent nausea or vomiting Anesthetic complications: no  No notable events documented.   Last Vitals:  Vitals:   09/18/22 0926 09/18/22 1106  BP: 125/84 (!) 94/47  Pulse: (!) 58 68  Resp: 17 12  Temp: 36.7 C (!) 36.4 C  SpO2: 98% 92%    Last Pain:  Vitals:   09/18/22 1110  TempSrc:   PainSc: 0-No pain                 Lynnley Doddridge C Annalysa Mohammad

## 2022-09-18 NOTE — Op Note (Signed)
[facility] Patient Name: Mary Strong Procedure Date: 09/18/2022 10:34 AM MRN: [id number] Date of Birth: 1961/02/05 Attending MD: Katrinka Blazing , , [id number] CSN: [id number] Age: 62 Admit Type: Outpatient Procedure:                Upper GI endoscopy Indications:              Epigastric abdominal pain Providers:                Katrinka Blazing, Sheran Fava, Kristine L.                            Jessee Avers, Technician Referring MD:              Medicines:                Monitored Anesthesia Care Complications:            No immediate complications. Estimated Blood Loss:     Estimated blood loss: none. Procedure:                Pre-Anesthesia Assessment:                           - Prior to the procedure, a History and Physical                            was performed, and patient medications, allergies                            and sensitivities were reviewed. The patient's                            tolerance of previous anesthesia was reviewed.                           - The risks and benefits of the procedure and the                            sedation options and risks were discussed with the                            patient. All questions were answered and informed                            consent was obtained.                           - ASA Grade Assessment: II - A patient with mild                            systemic disease.                           After obtaining informed consent, the endoscope was                            passed under direct vision. Throughout the  procedure, the patient's blood pressure, pulse, and                            oxygen saturations were monitored continuously. The                            GIF-H190 ([id number]) scope was introduced through the                            mouth, and advanced to the second part of duodenum.                            The upper GI endoscopy was accomplished  without                            difficulty. The patient tolerated the procedure                            well. Scope In: 10:48:35 AM Scope Out: 11:00:27 AM Total Procedure Duration: 0 hours 11 minutes 52 seconds  Findings:      The examined esophagus was normal.      A single 4 mm semi-pedunculated polyp with superficial erosion and slow       bleeding was found in the cardia. The polyp was removed with a hot       snare. Resection and retrieval were complete. For hemostasis, one       hemostatic clip was successfully placed. Clip manufacturer: [employer]. There was no bleeding at the end of the procedure.      Multiple small sessile fundic gland polyps with no bleeding were found       in the gastric body.      The entire examined stomach was normal. Biopsies were taken with a cold       forceps for Helicobacter pylori testing.      The examined duodenum was normal. Biopsies were taken with a cold       forceps for histology. Impression:               - Normal esophagus.                           - A single gastric polyp. Resected and retrieved.                            Clip was placed. Clip manufacturer: [employer].                           - Multiple fundic gland polyps.                           - Normal stomach. Biopsied.                           - Normal examined duodenum. Biopsied.  Moderate Sedation:      Per Anesthesia Care Recommendation:           - Discharge patient to home (ambulatory).                           - Resume previous diet.                           - Await pathology results.                           - Use Protonix (pantoprazole) 40 mg PO BID for 2                            months, then go down to daily dosing.                           - Try taking FDGard as needed for abdominal pain. Procedure Code(s):        --- Professional ---                           [phone], 59, Esophagogastroduodenoscopy,  flexible,                            transoral; with control of bleeding, any method                           43251, Esophagogastroduodenoscopy, flexible,                            transoral; with removal of tumor(s), polyp(s), or                            other lesion(s) by snare technique                           43239, 59, Esophagogastroduodenoscopy, flexible,                            transoral; with biopsy, single or multiple Diagnosis Code(s):        --- Professional ---                           K31.7, Polyp of stomach and duodenum                           R10.13, Epigastric pain CPT copyright 2022 American Medical Association. All rights reserved. The codes documented in this report are preliminary and upon coder review may  be revised to meet current compliance requirements. Katrinka Blazing, MD Katrinka Blazing,  09/18/2022 11:16:58 AM This report has been signed electronically. Number of Addenda: 0

## 2022-09-18 NOTE — Transfer of Care (Signed)
Immediate Anesthesia Transfer of Care Note  Patient: Mary Strong  Procedure(s) Performed: ESOPHAGOGASTRODUODENOSCOPY (EGD) WITH PROPOFOL BIOPSY POLYPECTOMY  Patient Location: Endoscopy Unit  Anesthesia Type:General  Level of Consciousness: awake, alert , and oriented  Airway & Oxygen Therapy: Patient Spontanous Breathing  Post-op Assessment: Report given to RN, Post -op Vital signs reviewed and stable, Patient moving all extremities X 4, and Patient able to stick tongue midline  Post vital signs: Reviewed  Last Vitals:  Vitals Value Taken Time  BP 94/47   Temp 97.5   Pulse 68   Resp 12   SpO2 94     Last Pain:  Vitals:   09/18/22 1043  TempSrc:   PainSc: 3       Patients Stated Pain Goal: 7 (09/18/22 0926)  Complications: No notable events documented.

## 2022-09-21 LAB — SURGICAL PATHOLOGY

## 2022-09-29 ENCOUNTER — Encounter (HOSPITAL_COMMUNITY): Payer: Self-pay | Admitting: Gastroenterology

## 2022-10-01 ENCOUNTER — Ambulatory Visit: Payer: BC Managed Care – PPO | Admitting: Nurse Practitioner

## 2022-10-01 DIAGNOSIS — R002 Palpitations: Secondary | ICD-10-CM | POA: Diagnosis not present

## 2022-10-19 ENCOUNTER — Ambulatory Visit: Payer: BC Managed Care – PPO | Attending: Nurse Practitioner | Admitting: Nurse Practitioner

## 2022-10-19 ENCOUNTER — Encounter: Payer: Self-pay | Admitting: Nurse Practitioner

## 2022-10-19 VITALS — BP 124/82 | HR 73 | Ht 62.0 in | Wt 189.8 lb

## 2022-10-19 DIAGNOSIS — E785 Hyperlipidemia, unspecified: Secondary | ICD-10-CM

## 2022-10-19 DIAGNOSIS — R002 Palpitations: Secondary | ICD-10-CM | POA: Diagnosis not present

## 2022-10-19 DIAGNOSIS — I10 Essential (primary) hypertension: Secondary | ICD-10-CM | POA: Diagnosis not present

## 2022-10-19 DIAGNOSIS — I2510 Atherosclerotic heart disease of native coronary artery without angina pectoris: Secondary | ICD-10-CM

## 2022-10-19 DIAGNOSIS — Z0001 Encounter for general adult medical examination with abnormal findings: Secondary | ICD-10-CM | POA: Diagnosis not present

## 2022-10-19 DIAGNOSIS — Z1231 Encounter for screening mammogram for malignant neoplasm of breast: Secondary | ICD-10-CM | POA: Diagnosis not present

## 2022-10-19 DIAGNOSIS — E669 Obesity, unspecified: Secondary | ICD-10-CM

## 2022-10-19 DIAGNOSIS — E559 Vitamin D deficiency, unspecified: Secondary | ICD-10-CM | POA: Diagnosis not present

## 2022-10-19 DIAGNOSIS — E1165 Type 2 diabetes mellitus with hyperglycemia: Secondary | ICD-10-CM | POA: Diagnosis not present

## 2022-10-19 DIAGNOSIS — E7849 Other hyperlipidemia: Secondary | ICD-10-CM | POA: Diagnosis not present

## 2022-10-19 DIAGNOSIS — E7801 Familial hypercholesterolemia: Secondary | ICD-10-CM | POA: Diagnosis not present

## 2022-10-19 NOTE — Progress Notes (Unsigned)
Office Visit    Patient Name: Mary Strong Date of Encounter: 10/19/2022  PCP:  Richardean Chimera, MD   [facility]  Cardiologist:  Nona Dell, MD  Advanced Practice Provider:  Sharlene Dory, NP Electrophysiologist:  None   Chief Complaint    Mary Strong is a very pleasant 62 y.o. female with a hx of CAD, chest pain, HLD, T2DM, OSA, palpitations, and HTN, who presents today for follow-up.   Past Medical History    Past Medical History:  Diagnosis Date   Anxiety    Arthritis    CAD (coronary artery disease)    Heavily calcified vessels, DES to CTO RCA and DES to mid LAD September 2020   Constipation    Depression    Essential hypertension    Fatty liver    GERD (gastroesophageal reflux disease)    History of migraine    History of sleep apnea    Currently not using CPAP   Hypothyroidism    Multiple sclerosis (HCC)    Neurogenic bladder    Neuropathy    Palpitations    Restless leg syndrome    Skin cancer    Basal cell cancer on forehead, strong family hx of adenocarcinoma   Type 2 diabetes mellitus (HCC)    Past Surgical History:  Procedure Laterality Date   BIOPSY  11/05/2020   Procedure: BIOPSY;  Surgeon: Dolores Frame, MD;  Location: [facility];  Service: Gastroenterology;;   BIOPSY  09/18/2022   Procedure: BIOPSY;  Surgeon: Dolores Frame, MD;  Location: [facility];  Service: Gastroenterology;;   CESAREAN SECTION     x 2   CHOLECYSTECTOMY     COLONOSCOPY WITH PROPOFOL N/A 11/05/2020   Procedure: COLONOSCOPY WITH PROPOFOL;  Surgeon: Dolores Frame, MD;  Location: [facility];  Service: Gastroenterology;  Laterality: N/A;  12:30   CORONARY ATHERECTOMY N/A 01/18/2019   Procedure: CORONARY ATHERECTOMY;  Surgeon: Swaziland, Peter M, MD;  Location: [facility] INVASIVE CV LAB;  Service: Cardiovascular;  Laterality: N/A;   CORONARY CTO INTERVENTION N/A 01/18/2019   Procedure: CORONARY CTO  INTERVENTION;  Surgeon: Swaziland, Peter M, MD;  Location: [facility] INVASIVE CV LAB;  Service: Cardiovascular;  Laterality: N/A;   CORONARY STENT INTERVENTION N/A 01/18/2019   Procedure: CORONARY STENT INTERVENTION;  Surgeon: Swaziland, Peter M, MD;  Location: [facility] INVASIVE CV LAB;  Service: Cardiovascular;  Laterality: N/A;   ENDOMETRIAL ABLATION     ESOPHAGOGASTRODUODENOSCOPY (EGD) WITH PROPOFOL N/A 11/05/2020   Procedure: ESOPHAGOGASTRODUODENOSCOPY (EGD) WITH PROPOFOL;  Surgeon: Dolores Frame, MD;  [facility];  Service: Gastroenterology;  Laterality: N/A;   ESOPHAGOGASTRODUODENOSCOPY (EGD) WITH PROPOFOL N/A 09/18/2022   Procedure: ESOPHAGOGASTRODUODENOSCOPY (EGD) WITH PROPOFOL;  Surgeon: Dolores Frame, MD;  [facility]: [facility];  Service: Gastroenterology;  Laterality: N/A;  10:30am;ASA 2   GIVENS CAPSULE STUDY N/A 11/18/2020   Procedure: GIVENS CAPSULE STUDY;  Surgeon: Dolores Frame, MD;  Location: [facility];  Service: Gastroenterology;  Laterality: N/A;  7:30   JOINT REPLACEMENT     LEFT HEART CATH AND CORONARY ANGIOGRAPHY N/A 01/10/2019   Procedure: LEFT HEART CATH AND CORONARY ANGIOGRAPHY;  Surgeon: Lyn Records, MD;  Location: [facility] INVASIVE CV LAB;  Service: Cardiovascular;  Laterality: N/A;   POLYPECTOMY  11/05/2020   Procedure: POLYPECTOMY;  Surgeon: Dolores Frame, MD;  [facility]: [facility];  Service: Gastroenterology;;   POLYPECTOMY  09/18/2022   Procedure: POLYPECTOMY;  Surgeon:  Dolores Frame, MD;  [facility]: [facility];  Service: Gastroenterology;;   TONSILLECTOMY     TOTAL HIP ARTHROPLASTY Right 12/04/2015   TOTAL HIP ARTHROPLASTY Right 12/04/2015   Procedure: TOTAL HIP ARTHROPLASTY ANTERIOR APPROACH;  Surgeon: Eldred Manges, MD;  Location: [facility] OR;  Service: Orthopedics;  Laterality: Right;   TOTAL HIP ARTHROPLASTY Left 01/24/2016   Procedure: TOTAL HIP ARTHROPLASTY ANTERIOR APPROACH;  Surgeon: Eldred Manges, MD;  Location: [facility]  OR;  Service: Orthopedics;  Laterality: Left;  anterior approach   TUBAL LIGATION      Allergies  Allergies  Allergen Reactions   Shellfish Allergy Anaphylaxis   Ciprofloxacin Hives   Copaxone [Glatiramer Acetate] Hives   Penicillins Hives and Rash    Bad headaches Did it involve swelling of the face/tongue/throat, SOB, or low BP? No Did it involve sudden or severe rash/hives, skin peeling, or any reaction on the inside of your mouth or nose? No Did you need to seek medical attention at a hospital or doctor's office? No When did it last happen?      6 - 7 years If all above answers are "NO", may proceed with cephalosporin use.    Vancomycin Itching    History of Present Illness    Mary Strong is a very pleasant 62 y.o. female with a PMH as mentioned above.   Last seen by Jacolyn Reedy, PA-C on 05/11/2022.  Noticed occasional skipped heartbeats, not frequent.  Overall doing well from a cardiac perspective.  I last saw this patient on April 4,2024 for chest pain evaluation. Noted episodes of mid/lower chest pain while walking with increased heart rates, admitted to dyspnea on exertion over the past month. After resting, HR returned to normal and her chest pain was relieved. Attributed her symptoms to GERD. Non-radiating, brief in duration. Denied any other associated symptoms.   Patient presented to [facility] ED on 08/08/2022 with chest pain, said it was the "worst chest pain of her life." Had radiation to right arm. Took SL NTG with some improvement, took 2 additional tablets, no improvement. EMS did EKG that showed T wave inversions, went to ED for further eval. EKG and trops were negative in ED. Left AMA.   Underwent NST 08/17/2022 that was low risk. Noted small regional of inferolateral ischemia as described, nuclear stress EF normal.   I last saw her for follow-up on August 20, 2022. Noted a limited episode of facial pain after returning home from ED. Continued to  note episodes of CP with HR elevations and with walking, stable over time, had to take breaks, denied taking any NTG tablets recently. Would notice around 3 times per week while getting out of shower, she would develop CP/SHOB, had to sit on bed, and symptoms resolved in 3-5 minutes. Noticed palpitations, but says HR well controlled. Medication adjusted and Zio monitor arranged - see report below.   Today she presents for follow-up. Doing much better and doing well. Palpitations have improved. Denies any chest pain, shortness of breath, palpitations, syncope, presyncope, dizziness, orthopnea, PND, swelling or significant weight changes, acute bleeding, or claudication.  SH: Former Company secretary, former ED Charity fundraiser at [employer] Studies Reviewed:   The following studies were reviewed today:   EKG:  EKG is not ordered today.    Monitor 10/2022: Predominant rhythm is sinus with heart rate ranging from 52 bpm up to 152 bpm and average heart rate 82 bpm. There were rare  PACs including atrial couplets and triplets representing less than 1% total beats. There were occasional PVCs representing 1.6% total beats with otherwise rare ventricular couplets and limited episodes of ventricular bigeminy and trigeminy. Patient triggered episodes were generally associated with PVCs. There were a few brief episodes of PSVT, the longest of which was only 14 beats.  No sustained atrial arrhythmias. No pauses or high degree heart block.  Echo 08/2022:   1. Left ventricular ejection fraction, by estimation, is 65 to 70%. The  left ventricle has normal function. The left ventricle has no regional  wall motion abnormalities. There is mild asymmetric left ventricular  hypertrophy of the basal segment. Left  ventricular diastolic parameters are consistent with Grade II diastolic  dysfunction (pseudonormalization). The average left ventricular global  longitudinal strain is -21.5 %. The  global longitudinal strain is normal.   2. Right ventricular systolic function is normal. The right ventricular  size is normal. There is normal pulmonary artery systolic pressure. The  estimated right ventricular systolic pressure is 18.5 mmHg.   3. The mitral valve is grossly normal. Trivial mitral valve  regurgitation.   4. The aortic valve is tricuspid. There is mild calcification of the  aortic valve. Aortic valve regurgitation is not visualized. Aortic valve  sclerosis is present, with no evidence of aortic valve stenosis. Aortic  valve mean gradient measures 5.0 mmHg.   5. The inferior vena cava is normal in size with greater than 50%  respiratory variability, suggesting right atrial pressure of 3 mmHg.   Comparison(s): Prior images reviewed side by side. LVEF remains normal  range at 65-70%.  Myoview 08/17/2022:   Findings are consistent with ischemia. The study is low risk.   No ST deviation was noted. The ECG was negative for ischemia.   LV perfusion is abnormal.  Mild breast attenuation noted.  Small, mild intensity, apical to basal inferolateral defect that is partially reversible and consistent with small ischemic territory.   Left ventricular function is normal. Nuclear stress EF: 71 %.   Small region of inferolateral ischemia as described, overall low risk with LVEF 71%.  Echo 07/2021:  1. Left ventricular ejection fraction, by estimation, is 65 to 70%. The  left ventricle has normal function. The left ventricle has no regional  wall motion abnormalities. Left ventricular diastolic parameters are  consistent with Grade I diastolic  dysfunction (impaired relaxation).   2. Right ventricular systolic function is normal. The right ventricular  size is normal.   3. The mitral valve is normal in structure. No evidence of mitral valve  regurgitation. No evidence of mitral stenosis.   4. The aortic valve is normal in structure. Aortic valve regurgitation is  not visualized. No  aortic stenosis is present.   5. The inferior vena cava is normal in size with greater than 50%  respiratory variability, suggesting right atrial pressure of 3 mmHg.  Myoview 07/2021:   The study is normal. The study is low risk.   No ST deviation was noted.   LV perfusion is normal. There is no evidence of ischemia. There is no evidence of infarction. Small apical mild defect compatable with breast attenuation.   Left ventricular function is normal. End diastolic cavity size is normal. End systolic cavity size is normal.  Recent Labs: 08/27/2022: ALT 24; Hemoglobin 13.5; Platelets 172  Recent Lipid Panel    Component Value Date/Time   CHOL  05/10/2010 2350    143        ATP  III CLASSIFICATION:  <200     mg/dL   Desirable  161-096  mg/dL   Borderline High  >=045    mg/dL   High          TRIG 409 (H) 08/27/2022 1500   HDL 22 (L) 05/10/2010 2350   CHOLHDL 6.5 05/10/2010 2350   VLDL 52 (H) 05/10/2010 2350   LDLCALC  05/10/2010 2350    69        Total Cholesterol/HDL:CHD Risk Coronary Heart Disease Risk Table                     Men   Women  1/2 Average Risk   3.4   3.3  Average Risk       5.0   4.4  2 X Average Risk   9.6   7.1  3 X Average Risk  23.4   11.0        Use the calculated Patient Ratio above and the CHD Risk Table to determine the patient's CHD Risk.        ATP III CLASSIFICATION (LDL):  <100     mg/dL   Optimal  811-914  mg/dL   Near or Above                    Optimal  130-159  mg/dL   Borderline  782-956  mg/dL   High  >213     mg/dL   Very High    Home Medications   Current Meds  Medication Sig   amLODipine (NORVASC) 5 MG tablet Take 1 tablet (5 mg total) by mouth daily.   aspirin 81 MG tablet Take 81 mg by mouth daily.   B-D 3CC LUER-LOK SYR 25GX1" 25G X 1" 3 ML MISC Inject into the muscle.   Biotin 08657 MCG TABS Take 10,000 mcg by mouth daily.   cyanocobalamin (VITAMIN B12) 1000 MCG/ML injection Inject 1000 mcg vitamin B12 intramuscularly every 7  days x 28 days, followed by monthly injections.   diazepam (VALIUM) 5 MG tablet TAKE ONE TABLET BY MOUTH THREE TIMES DAILY AS NEEDED   dicyclomine (BENTYL) 10 MG capsule Take 1 capsule (10 mg total) by mouth every 12 (twelve) hours as needed (abdominal pain).   DULoxetine (CYMBALTA) 60 MG capsule Take 1 capsule (60 mg total) by mouth at bedtime.   folic acid (FOLVITE) 1 MG tablet Take 1 mg by mouth every other day.   gabapentin (NEURONTIN) 600 MG tablet TAKE ONE TABLET BY MOUTH FOUR TIMES DAILY (Patient taking differently: Take 600 mg by mouth 3 (three) times daily.)   levothyroxine (SYNTHROID) 112 MCG tablet Take 112 mcg by mouth daily before breakfast.   metFORMIN (GLUCOPHAGE-XR) 500 MG 24 hr tablet Take 1,000 mg by mouth 2 (two) times daily.   metoprolol tartrate (LOPRESSOR) 50 MG tablet TAKE ONE TABLET BY MOUTH TWICE DAILY   Needles & Syringes MISC For use to administer vitamin B12 injections at home.   nitroGLYCERIN (NITROSTAT) 0.4 MG SL tablet Place 1 tablet (0.4 mg total) under the tongue every 5 (five) minutes as needed.   ondansetron (ZOFRAN-ODT) 4 MG disintegrating tablet Take 4 mg by mouth every 8 (eight) hours as needed for vomiting or nausea.   pantoprazole (PROTONIX) 40 MG tablet Take 1 tablet (40 mg total) by mouth 2 (two) times daily for 120 days, THEN 1 tablet (40 mg total) daily.   Polyethyl Glycol-Propyl Glycol (SYSTANE OP) Place 1 drop into both eyes  as needed (dry eyes).   rosuvastatin (CRESTOR) 5 MG tablet Take 5 mg by mouth every Monday, Wednesday, and Friday.   Review of Systems    All other systems reviewed and are otherwise negative except as noted above.  Physical Exam    VS:  BP 124/82   Pulse 73   Ht 5\' 2"  (1.575 m)   Wt 189 lb 12.8 oz (86.1 kg)   SpO2 96%   BMI 34.71 kg/m  , BMI Body mass index is 34.71 kg/m.  Wt Readings from Last 3 Encounters:  10/20/22 190 lb 1.6 oz (86.2 kg)  10/19/22 189 lb 12.8 oz (86.1 kg)  09/18/22 189 lb (85.7 kg)     GEN:  Obese, 62 y.o. female in no acute distress. HEENT: normal. Neck: Supple, no JVD, carotid bruits, or masses. Cardiac: S1/S2, RRR, no murmurs, rubs, or gallops. No clubbing, cyanosis, edema.  Radials/PT 2+ and equal bilaterally.  Respiratory:  Respirations regular and unlabored, clear to auscultation bilaterally. MS: No deformity or atrophy. Skin: Warm and dry, no rash. Neuro:  Strength and sensation are intact. Psych: Normal affect.  Assessment & Plan    CAD Hx of multivessel CAD, s/p DES to CTA RCA and mLAD in 2020. Myoview 07/2021  low risk, negative for ischemia. Recent NST showed small regional of inferolateral ischemia, was overall low risk. Denies any chest pain after increasing Amlodipine. Continue aspirin, amlodipine, Lopressor, rosuvastatin, and nitroglycerin PRN. Heart healthy diet and regular cardiovascular exercise encouraged. ED precautions discussed.   2. HTN BP stable. Continue current medication regimen. Heart healthy diet and regular cardiovascular exercise encouraged.   3. HLD Managed by PCP. Continue crestor. Heart healthy diet and regular cardiovascular exercise encouraged.   4. Palpitations Improvement since last OV. Recent monitor was benign. Continue lopressor. Heart healthy diet encouraged. ED precautions discussed.   5. Obesity BMI 34.77. Weight loss via diet and exercise encouraged. Discussed the impact being overweight would have on cardiovascular risk.   Disposition: Follow up with Dr. Diona Browner or APP in 6 months or sooner if anything changes.   Signed, Sharlene Dory, NP 10/21/2022, 1:45 PM [facility]

## 2022-10-19 NOTE — Patient Instructions (Addendum)
Medication Instructions:  Your physician recommends that you continue on your current medications as directed. Please refer to the Current Medication list given to you today.  Labwork: none  Testing/Procedures: none  Follow-Up: Your physician recommends that you schedule a follow-up appointment in: 6 month follow up with Dr. Diona Browner  Any Other Special Instructions Will Be Listed Below (If Applicable).  If you need a refill on your cardiac medications before your next appointment, please call your pharmacy.

## 2022-10-20 ENCOUNTER — Ambulatory Visit (INDEPENDENT_AMBULATORY_CARE_PROVIDER_SITE_OTHER): Payer: BC Managed Care – PPO | Admitting: Gastroenterology

## 2022-10-20 ENCOUNTER — Encounter (INDEPENDENT_AMBULATORY_CARE_PROVIDER_SITE_OTHER): Payer: Self-pay | Admitting: Gastroenterology

## 2022-10-20 ENCOUNTER — Encounter (INDEPENDENT_AMBULATORY_CARE_PROVIDER_SITE_OTHER): Payer: Self-pay

## 2022-10-20 VITALS — BP 125/78 | HR 76 | Temp 97.6°F | Ht 62.0 in | Wt 190.1 lb

## 2022-10-20 DIAGNOSIS — R195 Other fecal abnormalities: Secondary | ICD-10-CM | POA: Diagnosis not present

## 2022-10-20 DIAGNOSIS — K219 Gastro-esophageal reflux disease without esophagitis: Secondary | ICD-10-CM

## 2022-10-20 NOTE — Progress Notes (Addendum)
Referring Provider: Richardean Chimera, MD Primary Care Physician:  Richardean Chimera, MD Primary GI Physician: Levon Hedger   Chief Complaint  Patient presents with   Abdominal Pain    Follow up on epigastric pain. Had egd and reports pain is much better.    HPI:   Mary Strong is a 62 y.o. female with past medical history of anxiety, coronary artery disease status post stent placement, depression, hypertension, fatty liver, GERD, multiple sclerosis complicated by neurogenic bladder and neuropathy, IBS, iron deficiency anemia, restless leg syndrome and diabetes type 2   Patient presenting today for follow up of abdominal pain  Last seen April 2024, at that time reporting history of suspected pancreatitis in march, this was her first episode, based solely on elevated lipase, no CT imaging. Pain is sharp/stabbing at times in LUQ. had some nausea and vomiting as well as diarrhea, but this has improved. She continued to have nausea but just one episode of emesis. pain is not as severe today.    Recommended to check TGs, HFP, CBC, IgG4, CT A/P with contrast  TGs 295, other labs WNL CT A/P without evidence of pancreatitis, recommended EGD for further evaluation  EGD as below, started on protonix advised to try FDgard  Present:  Patient states she is feeling much better. She stopped drinking diet cherry 7 up, she had been drinking a lot of this. Notes she has not felt any further abdominal pain since this.   She reports ongoing loose stools for years, she feels that when she started Trulicity a few years ago is when she noted her appetite went down significantly and she could go 9-10 days without a BM, she ended up coming off of the medication as she was concerned about GI side effects, she started having looser stools thereafter. She feels that trulicity really changed her bowel habits permanently. She can have 6-7 BMs per day, some days she may have one solid stool per day. She has some fecal  urgency at times as well. She does not take anything for her loose stools, she is hesitant to take any kind of anti diarrheals. She has no pain  with her loose stools. She denies rectal bleeding, notes she sometimes breaks out in a sweat with her BMs, despite not straining. Celiac disease panel negative a few years ago. Sometimes notes undigested food in her stool.   She notes that she has some regurgitation at times, only with drinking water.  This has not improved with use of protonix, she does not have these symptoms with anything but water.  No vomiting. She has some nausea at times on occasion which she takes zofran for.  Appetite is good and weight is stable. She limits meat intake as it tends to cause more bloating in her abdomen.   Givens capsule study: 11/2020 No presence of any vascular alterations, ulcerations, polyps or any other lesions explain the iron deficiency anemia, possibly related to malabsorption.  Last EGD: 09/2022 - Normal esophagus.                           - A single gastric polyp. Resected and retrieved.                            Clip was placed. Clip manufacturer: [employer].                           - Multiple fundic gland polyps.                           - Normal stomach. Biopsied.                           - Normal examined duodenum. Biopsied. (HPP, normal biopsies, no need to repeat)  Last Colonoscopy: 11/05/2020, normal terminal ileum, 4 polyps in the transverse and ascending colon (tubular adenomas), 5 mm polyp in the distal rectum (possible hypertrophic papilla), nonbleeding internal hemorrhoids.  Recommend to repeat in 5 years      Past Medical History:  Diagnosis Date   Anxiety    Arthritis    CAD (coronary artery disease)    Heavily calcified vessels, DES to CTO RCA and DES to mid LAD September 2020   Constipation    Depression    Essential hypertension    Fatty liver    GERD (gastroesophageal reflux disease)     History of migraine    History of sleep apnea    Currently not using CPAP   Hypothyroidism    Multiple sclerosis (HCC)    Neurogenic bladder    Neuropathy    Palpitations    Restless leg syndrome    Skin cancer    Basal cell cancer on forehead, strong family hx of adenocarcinoma   Type 2 diabetes mellitus (HCC)     Past Surgical History:  Procedure Laterality Date   BIOPSY  11/05/2020   Procedure: BIOPSY;  Surgeon: Dolores Frame, MD;  [facility];  Service: Gastroenterology;;   BIOPSY  09/18/2022   Procedure: BIOPSY;  Surgeon: Dolores Frame, MD;  [facility]: [facility];  Service: Gastroenterology;;   CESAREAN SECTION     x 2   CHOLECYSTECTOMY     COLONOSCOPY WITH PROPOFOL N/A 11/05/2020   Procedure: COLONOSCOPY WITH PROPOFOL;  Surgeon: Dolores Frame, MD;  [facility]: [facility];  Service: Gastroenterology;  Laterality: N/A;  12:30   CORONARY ATHERECTOMY N/A 01/18/2019   Procedure: CORONARY ATHERECTOMY;  Surgeon: Swaziland, Peter M, MD;  Location: [facility] INVASIVE CV LAB;  Service: Cardiovascular;  Laterality: N/A;   CORONARY CTO INTERVENTION N/A 01/18/2019   Procedure: CORONARY CTO INTERVENTION;  Surgeon: Swaziland, Peter M, MD;  Location: [facility] INVASIVE CV LAB;  Service: Cardiovascular;  Laterality: N/A;   CORONARY STENT INTERVENTION N/A 01/18/2019   Procedure: CORONARY STENT INTERVENTION;  Surgeon: Swaziland, Peter M, MD;  [facility] INVASIVE CV LAB;  Service: Cardiovascular;  Laterality: N/A;   ENDOMETRIAL ABLATION     ESOPHAGOGASTRODUODENOSCOPY (EGD) WITH PROPOFOL N/A 11/05/2020   Procedure: ESOPHAGOGASTRODUODENOSCOPY (EGD) WITH PROPOFOL;  Surgeon: Dolores Frame, MD;  [facility];  Service: Gastroenterology;  Laterality: N/A;   ESOPHAGOGASTRODUODENOSCOPY (EGD) WITH PROPOFOL N/A 09/18/2022   Procedure: ESOPHAGOGASTRODUODENOSCOPY (EGD) WITH PROPOFOL;  Surgeon: Dolores Frame, MD;  [facility];  Service:  Gastroenterology;  Laterality: N/A;  10:30am;ASA 2   GIVENS CAPSULE STUDY N/A 11/18/2020   Procedure: GIVENS CAPSULE STUDY;  Surgeon: Dolores Frame, MD;  [facility];  Service: Gastroenterology;  Laterality: N/A;  7:30   JOINT REPLACEMENT     LEFT HEART CATH AND CORONARY ANGIOGRAPHY N/A 01/10/2019   Procedure: LEFT HEART CATH  AND CORONARY ANGIOGRAPHY;  Surgeon: Lyn Records, MD;  Location: [facility] INVASIVE CV LAB;  Service: Cardiovascular;  Laterality: N/A;   POLYPECTOMY  11/05/2020   Procedure: POLYPECTOMY;  Surgeon: Dolores Frame, MD;  [facility];  Service: Gastroenterology;;   POLYPECTOMY  09/18/2022   Procedure: POLYPECTOMY;  Surgeon: Dolores Frame, MD;  [facility];  Service: Gastroenterology;;   TONSILLECTOMY     TOTAL HIP ARTHROPLASTY Right 12/04/2015   TOTAL HIP ARTHROPLASTY Right 12/04/2015   Procedure: TOTAL HIP ARTHROPLASTY ANTERIOR APPROACH;  Surgeon: Eldred Manges, MD;  Location: [facility] OR;  Service: Orthopedics;  Laterality: Right;   TOTAL HIP ARTHROPLASTY Left 01/24/2016   Procedure: TOTAL HIP ARTHROPLASTY ANTERIOR APPROACH;  Surgeon: Eldred Manges, MD;  Location: [facility] OR;  Service: Orthopedics;  Laterality: Left;  anterior approach   TUBAL LIGATION      Current Outpatient Medications  Medication Sig Dispense Refill   amLODipine (NORVASC) 5 MG tablet Take 1 tablet (5 mg total) by mouth daily. 90 tablet 1   aspirin 81 MG tablet Take 81 mg by mouth daily.     B-D 3CC LUER-LOK SYR 25GX1" 25G X 1" 3 ML MISC Inject into the muscle.     Biotin 16109 MCG TABS Take 10,000 mcg by mouth daily.     cyanocobalamin (VITAMIN B12) 1000 MCG/ML injection Inject 1000 mcg vitamin B12 intramuscularly every 7 days x 28 days, followed by monthly injections. 16 mL 0   diazepam (VALIUM) 5 MG tablet TAKE ONE TABLET BY MOUTH THREE TIMES DAILY AS NEEDED 90 tablet 5   dicyclomine (BENTYL) 10 MG capsule Take 1 capsule (10 mg total) by mouth every 12  (twelve) hours as needed (abdominal pain). 60 capsule 5   DULoxetine (CYMBALTA) 60 MG capsule Take 1 capsule (60 mg total) by mouth at bedtime. 30 capsule 11   folic acid (FOLVITE) 1 MG tablet Take 1 mg by mouth every other day.     gabapentin (NEURONTIN) 600 MG tablet TAKE ONE TABLET BY MOUTH FOUR TIMES DAILY (Patient taking differently: Take 600 mg by mouth 3 (three) times daily.) 120 tablet 11   levothyroxine (SYNTHROID) 112 MCG tablet Take 112 mcg by mouth daily before breakfast.     metFORMIN (GLUCOPHAGE-XR) 500 MG 24 hr tablet Take 1,000 mg by mouth 2 (two) times daily.     metoprolol tartrate (LOPRESSOR) 50 MG tablet TAKE ONE TABLET BY MOUTH TWICE DAILY 180 tablet 3   Needles & Syringes MISC For use to administer vitamin B12 injections at home. 16 each 0   nitroGLYCERIN (NITROSTAT) 0.4 MG SL tablet Place 1 tablet (0.4 mg total) under the tongue every 5 (five) minutes as needed. 25 tablet 2   ondansetron (ZOFRAN-ODT) 4 MG disintegrating tablet Take 4 mg by mouth every 8 (eight) hours as needed for vomiting or nausea.     pantoprazole (PROTONIX) 40 MG tablet Take 1 tablet (40 mg total) by mouth 2 (two) times daily for 120 days, THEN 1 tablet (40 mg total) daily. 510 tablet 0   Polyethyl Glycol-Propyl Glycol (SYSTANE OP) Place 1 drop into both eyes as needed (dry eyes).     rosuvastatin (CRESTOR) 5 MG tablet Take 5 mg by mouth every Monday, Wednesday, and Friday.     No current facility-administered medications for this visit.    Allergies as of 10/20/2022 - Review Complete 10/20/2022  Allergen Reaction Noted   Shellfish allergy Anaphylaxis 05/31/2014   Ciprofloxacin Hives 11/20/2015  Copaxone [glatiramer acetate] Hives 05/31/2014   Penicillins Hives and Rash 05/31/2014   Vancomycin Itching 05/31/2014    Family History  Problem Relation Age of Onset   Multiple sclerosis Mother    Arthritis/Rheumatoid Mother    Psoriasis Father    Atrial fibrillation Father    Hypertension Brother      Social History   Socioeconomic History   Marital status: Married    Spouse name: Not on file   Number of children: Not on file   Years of education: Not on file   Highest education level: Not on file  Occupational History   Not on file  Tobacco Use   Smoking status: Never    Passive exposure: Never   Smokeless tobacco: Never  Vaping Use   Vaping Use: Never used  Substance and Sexual Activity   Alcohol use: Not Currently    Comment: "none since 2014"   Drug use: Not Currently    Comment: none in 6 minths   Sexual activity: Not Currently  Other Topics Concern   Not on file  Social History Narrative   Not on file   Social Determinants of Health   Financial Resource Strain: Low Risk  (10/24/2020)   Overall Financial Resource Strain (CARDIA)    Difficulty of Paying Living Expenses: Not hard at all  Food Insecurity: No Food Insecurity (10/24/2020)   Hunger Vital Sign    Worried About Running Out of Food in the Last Year: Never true    Ran Out of Food in the Last Year: Never true  Transportation Needs: No Transportation Needs (10/24/2020)   PRAPARE - Administrator, Civil Service (Medical): No    Lack of Transportation (Non-Medical): No  Physical Activity: Inactive (10/24/2020)   Exercise Vital Sign    Days of Exercise per Week: 0 days    Minutes of Exercise per Session: 0 min  Stress: No Stress Concern Present (10/24/2020)   [employer] of Occupational Health - Occupational Stress Questionnaire    Feeling of Stress : Only a little  Social Connections: Socially Integrated (10/24/2020)   Social Connection and Isolation Panel [NHANES]    Frequency of Communication with Friends and Family: More than three times a week    Frequency of Social Gatherings with Friends and Family: More than three times a week    Attends Religious Services: More than 4 times per year    Active Member of [employer] or Organizations: Yes    Attends Engineer, structural: More  than 4 times per year    Marital Status: Married   Review of systems General: negative for malaise, night sweats, fever, chills, weight loss Neck: Negative for lumps, goiter, pain and significant neck swelling Resp: Negative for cough, wheezing, dyspnea at rest CV: Negative for chest pain, leg swelling, palpitations, orthopnea GI: denies melena, hematochezia, nausea, vomiting, constipation, dysphagia, odyonophagia, early satiety or unintentional weight loss. +loose stools  MSK: Negative for joint pain or swelling, back pain, and muscle pain. Derm: Negative for itching or rash Psych: Denies depression, anxiety, memory loss, confusion. No homicidal or suicidal ideation.  Heme: Negative for prolonged bleeding, bruising easily, and swollen nodes. Endocrine: Negative for cold or heat intolerance, polyuria, polydipsia and goiter. Neuro: negative for tremor, gait imbalance, syncope and seizures. The remainder of the review of systems is noncontributory.  Physical Exam: BP 125/78 (BP Location: Right Arm, Patient Position: Sitting, Cuff Size: Large)   Pulse 76   Temp 97.6 F (36.4 C) (  Oral)   Ht 5\' 2"  (1.575 m)   Wt 190 lb 1.6 oz (86.2 kg)   BMI 34.77 kg/m  General:   Alert and oriented. No distress noted. Pleasant and cooperative.  Head:  Normocephalic and atraumatic. Eyes:  Conjuctiva clear without scleral icterus. Mouth:  Oral mucosa pink and moist. Good dentition. No lesions. Heart: Normal rate and rhythm, s1 and s2 heart sounds present.  Lungs: Clear lung sounds in all lobes. Respirations equal and unlabored. Abdomen:  +BS, soft, non-tender and non-distended. No rebound or guarding. No HSM or masses noted. Derm: No palmar erythema or jaundice Msk:  Symmetrical without gross deformities. Normal posture. Extremities:  Without edema. Neurologic:  Alert and  oriented x4 Psych:  Alert and cooperative. Normal mood and affect.  Invalid input(s): "6 MONTHS"   ASSESSMENT: TZIPPORA GILLEO is a 62 y.o. female presenting today for follow up of abdominal pain.  Previously with abdominal pain, question of pancreatitis due to elevated lipase though no imaging done at time of onset, CT A/P done later in April showed no evidence of pancreatitis or any pancreatic lesions, she continued to have pain and underwent EGD as above. She has been on Protonix 40mg  BID and stopped drinking diety cherry 7 up with resolution of her abdominal pain. Advised to continue with PPI BID, step down to once daily dosing in September.  Notably with ongoing loose stools for the past few years, celiac testing previously has been negative. She feels trulicity may have altered her bowel habits as loose stools started just after taking this. With question of possible pancreatitis in the past, certainly raises concern for pancreatic insufficiency. I discussed further testing for this with the patient, however, she does not wish to pursue any further testing, she feels she manages her symptoms well at this time. She has no weight loss, rectal bleeding or appetite changes. She will make me aware if she wishes to pursue further workup    PLAN:  Continue with protonix 40mg  BID, step down to once daily in September  2. Reflux precautions  3. Pt to make me aware if she wishes to pursue further workup of loose stools  All questions were answered, patient verbalized understanding and is in agreement with plan as outlined above.    Follow Up: 1 year   Karisha Marlin L. Jeanmarie Hubert, MSN, APRN, AGNP-C Adult-Gerontology Nurse Practitioner [facility]  I have reviewed the note and agree with the APP's assessment as described in this progress note  Katrinka Blazing, MD Gastroenterology and Hepatology [facility]

## 2022-10-20 NOTE — Patient Instructions (Addendum)
Please continue with protonix 40mg  BID for now, step down to once daily dosing in September as previously recommended If you decide you would like to pursue further evaluation of your loose stools, please let me know  Follow up 1 year   It was a pleasure to see you today. I want to create trusting relationships with patients and provide genuine, compassionate, and quality care. I truly value your feedback! please be on the lookout for a survey regarding your visit with me today. I appreciate your input about our visit and your time in completing this!    Scotlyn Mccranie L. Jeanmarie Hubert, MSN, APRN, AGNP-C Adult-Gerontology Nurse Practitioner [facility]astroenterology at [facility]

## 2022-10-29 ENCOUNTER — Encounter (INDEPENDENT_AMBULATORY_CARE_PROVIDER_SITE_OTHER): Payer: Self-pay

## 2022-10-30 ENCOUNTER — Other Ambulatory Visit (INDEPENDENT_AMBULATORY_CARE_PROVIDER_SITE_OTHER): Payer: Self-pay | Admitting: Gastroenterology

## 2022-12-23 ENCOUNTER — Ambulatory Visit: Payer: Medicare Other | Admitting: Neurology

## 2023-01-15 ENCOUNTER — Inpatient Hospital Stay: Payer: BC Managed Care – PPO | Attending: Hematology

## 2023-01-15 DIAGNOSIS — E538 Deficiency of other specified B group vitamins: Secondary | ICD-10-CM | POA: Insufficient documentation

## 2023-01-15 DIAGNOSIS — R197 Diarrhea, unspecified: Secondary | ICD-10-CM | POA: Diagnosis not present

## 2023-01-15 DIAGNOSIS — D509 Iron deficiency anemia, unspecified: Secondary | ICD-10-CM | POA: Insufficient documentation

## 2023-01-15 DIAGNOSIS — R059 Cough, unspecified: Secondary | ICD-10-CM | POA: Insufficient documentation

## 2023-01-15 DIAGNOSIS — E559 Vitamin D deficiency, unspecified: Secondary | ICD-10-CM | POA: Insufficient documentation

## 2023-01-15 LAB — CBC WITH DIFFERENTIAL/PLATELET
Abs Immature Granulocytes: 0.05 10*3/uL (ref 0.00–0.07)
Basophils Absolute: 0 10*3/uL (ref 0.0–0.1)
Basophils Relative: 0 %
Eosinophils Absolute: 0.1 10*3/uL (ref 0.0–0.5)
Eosinophils Relative: 1 %
HCT: 42.2 % (ref 36.0–46.0)
Hemoglobin: 14.2 g/dL (ref 12.0–15.0)
Immature Granulocytes: 1 %
Lymphocytes Relative: 25 %
Lymphs Abs: 1.5 10*3/uL (ref 0.7–4.0)
MCH: 29.8 pg (ref 26.0–34.0)
MCHC: 33.6 g/dL (ref 30.0–36.0)
MCV: 88.5 fL (ref 80.0–100.0)
Monocytes Absolute: 0.4 10*3/uL (ref 0.1–1.0)
Monocytes Relative: 6 %
Neutro Abs: 4.1 10*3/uL (ref 1.7–7.7)
Neutrophils Relative %: 67 %
Platelets: 175 10*3/uL (ref 150–400)
RBC: 4.77 MIL/uL (ref 3.87–5.11)
RDW: 14.1 % (ref 11.5–15.5)
WBC: 6.2 10*3/uL (ref 4.0–10.5)
nRBC: 0 % (ref 0.0–0.2)

## 2023-01-15 LAB — COMPREHENSIVE METABOLIC PANEL
ALT: 27 U/L (ref 0–44)
AST: 31 U/L (ref 15–41)
Albumin: 3.9 g/dL (ref 3.5–5.0)
Alkaline Phosphatase: 52 U/L (ref 38–126)
Anion gap: 11 (ref 5–15)
BUN: 10 mg/dL (ref 8–23)
CO2: 23 mmol/L (ref 22–32)
Calcium: 8.7 mg/dL — ABNORMAL LOW (ref 8.9–10.3)
Chloride: 102 mmol/L (ref 98–111)
Creatinine, Ser: 0.93 mg/dL (ref 0.44–1.00)
GFR, Estimated: >60 mL/min (ref >60)
Glucose, Bld: 208 mg/dL — ABNORMAL HIGH (ref 70–99)
Potassium: 4 mmol/L (ref 3.5–5.1)
Sodium: 136 mmol/L (ref 135–145)
Total Bilirubin: 0.4 mg/dL (ref 0.3–1.2)
Total Protein: 7.1 g/dL (ref 6.5–8.1)

## 2023-01-15 LAB — FOLATE: Folate: 20.4 ng/mL (ref >5.9)

## 2023-01-15 LAB — IRON AND TIBC
Iron: 80 ug/dL (ref 28–170)
Saturation Ratios: 22 % (ref 10.4–31.8)
TIBC: 359 ug/dL (ref 250–450)
UIBC: 279 ug/dL

## 2023-01-15 LAB — FERRITIN: Ferritin: 82 ng/mL (ref 11–307)

## 2023-01-15 LAB — VITAMIN B12: Vitamin B-12: 203 pg/mL (ref 180–914)

## 2023-01-18 LAB — METHYLMALONIC ACID, SERUM: Methylmalonic Acid, Quantitative: 221 nmol/L (ref 0–378)

## 2023-01-20 ENCOUNTER — Encounter: Payer: Self-pay | Admitting: Neurology

## 2023-01-22 ENCOUNTER — Other Ambulatory Visit: Payer: Self-pay

## 2023-01-22 ENCOUNTER — Encounter: Payer: Self-pay | Admitting: Oncology

## 2023-01-22 ENCOUNTER — Inpatient Hospital Stay: Payer: BC Managed Care – PPO | Admitting: Oncology

## 2023-01-22 DIAGNOSIS — D509 Iron deficiency anemia, unspecified: Secondary | ICD-10-CM | POA: Diagnosis not present

## 2023-01-22 DIAGNOSIS — E538 Deficiency of other specified B group vitamins: Secondary | ICD-10-CM | POA: Diagnosis not present

## 2023-01-22 NOTE — Progress Notes (Signed)
[facility] [address], [place] [address]   CLINIC:  Medical Oncology/Hematology  PCP:  Richardean Chimera, MD [address] [place] [address]    [phone]   REASON FOR VISIT:  Follow-up for iron deficiency anemia   PRIOR THERAPY: Oral iron tablet (unable to tolerate)   CURRENT THERAPY: Intermittent IV iron (Injectafer x2 on 11/01/2020 and 11/08/2020)  INTERVAL HISTORY:   Mary Strong 62 y.o. female returns for routine follow-up of iron deficiency anemia, suspected to be secondary to malabsorption.  She was last evaluated by Rojelio Brenner PA-C in clinic o 07/23/2022.  Most recent IV iron repletion was with IV Feraheme on 07/31/2022.  Reports she may have Covid. Started feeling bad on Tuesday. Reports a really bad cough and sore throat. Has occasional diarrhea and very low appetite. Taking decongestants and tylenol for fever.    Before the symptoms of COVID, she was feeling well.  Appetite was 50% energy levels 25%.  Appetite is 50% energy levels are 25%.  She is currently on folic acid every other day, B12 injections monthly and vitamin D supplements 50,000 units weekly.  She gives herself monthly B12 injections which she believes she is due for her next 1 this week.  Reports good toleration of supplements at this time.   ASSESSMENT & PLAN:   Iron deficiency anemia - Etiology suspected to be malabsorption in the setting of long-term PPI use - Extensive GI work-up in July 2022 (EGD, colonoscopy, capsule study) did not reveal any source of blood loss - She is unable to tolerate oral iron supplements due to side effects - Received IV iron repletion with Injectafer x2 on 11/01/2020 and 11/08/2020 and with IV Feraheme on 07/31/2022. -Labs from 01/15/2023 show normal CBC with a hemoglobin of 42.2.  Ferritin 82 with saturation 22%.  Vitamin B12 203 and MMA 221. - No obvious bleeding such as bright red blood per rectum, melena, or epistaxis     -She does not need any  additional IV iron at this time. -Recommend follow-up in 6 months with labs a few days before and in office visit.  2.  Folic acid deficiency - Folic acid deficiency noted in September 2023 with folate 2.8 - Has been taking the 400 mcg folic acid supplement daily since September 2023 and was decreased to every other day in March 2024. -Most recent folate level is 20.4. -Continue 400 mcg folic acid every other day.  3.  Vitamin B12 deficiency - Most recent labs from 01/15/2023 show a vitamin B12 level of 203 and MMA of 221. -She was given 1 dose of B12 on 07/23/2022. 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 -She was scheduled to give herself on this week but then got COVID.  States she will probably give herself an injection next week. -Continue B12 injections monthly.   4.  Indigestion - Follows with Dr. Levon Hedger for her gastroenterological complaints - Patient reports that she has difficulty tolerating oral vitamin supplements, as they cause significant indigestion and upset stomach. - She also reports that for the past year she has been unable to tolerate meat and some dairy products, reports that she feels that she gets full easily. -Will discuss with her PCP possible alpha gal testing in the future.  5.  Other history - She is a retired Engineer, civil (consulting), previously worked at [employer] and [employer] - PMH is also positive for MS  6. Vitamin D deficiency- -On  weekly Vitamin D 50,000 units  -She has follow-up with PCP for repeat vitamin D testing.  PLAN SUMMARY: >> Continue B12 injections monthly.  Patient will give to her self. >> Recommend labs in 6 months ( BC/D, CMP, ferritin, iron/TIBC, folate, B12, MMA) and office visit a few days later.       REVIEW OF SYSTEMS:   Review of Systems  Constitutional:  Positive for chills, diaphoresis, fatigue and fever.  HENT:   Positive for sore throat.   Respiratory:  Positive for cough.   Gastrointestinal:  Positive  for diarrhea.     PHYSICAL EXAM:  ECOG PERFORMANCE STATUS: 1 - Symptomatic but completely ambulatory  There were no vitals filed for this visit. There were no vitals filed for this visit. Physical Exam Neurological:     Mental Status: She is alert and oriented to person, place, and time.     PAST MEDICAL/SURGICAL HISTORY:  Past Medical History:  Diagnosis Date   Anxiety    Arthritis    CAD (coronary artery disease)    Heavily calcified vessels, DES to CTO RCA and DES to mid LAD September 2020   Constipation    Depression    Essential hypertension    Fatty liver    GERD (gastroesophageal reflux disease)    History of migraine    History of sleep apnea    Currently not using CPAP   Hypothyroidism    Multiple sclerosis (HCC)    Neurogenic bladder    Neuropathy    Palpitations    Restless leg syndrome    Skin cancer    Basal cell cancer on forehead, strong family hx of adenocarcinoma   Type 2 diabetes mellitus (HCC)    Past Surgical History:  Procedure Laterality Date   BIOPSY  11/05/2020   Procedure: BIOPSY;  Surgeon: Dolores Frame, MD;  [facility]: [facility];  Service: Gastroenterology;;   BIOPSY  09/18/2022   Procedure: BIOPSY;  Surgeon: Dolores Frame, MD;  [facility]: [facility];  Service: Gastroenterology;;   CESAREAN SECTION     x 2   CHOLECYSTECTOMY     COLONOSCOPY WITH PROPOFOL N/A 11/05/2020   Procedure: COLONOSCOPY WITH PROPOFOL;  Surgeon: Dolores Frame, MD;  [facility]: [facility];  Service: Gastroenterology;  Laterality: N/A;  12:30   CORONARY ATHERECTOMY N/A 01/18/2019   Procedure: CORONARY ATHERECTOMY;  Surgeon: Swaziland, Peter M, MD;  Location: [facility] INVASIVE CV LAB;  Service: Cardiovascular;  Laterality: N/A;   CORONARY CTO INTERVENTION N/A 01/18/2019   Procedure: CORONARY CTO INTERVENTION;  Surgeon: Swaziland, Peter M, MD;  Location: [facility] INVASIVE CV LAB;  Service: Cardiovascular;  Laterality: N/A;   CORONARY STENT INTERVENTION  N/A 01/18/2019   Procedure: CORONARY STENT INTERVENTION;  Surgeon: Swaziland, Peter M, MD;  Location: [facility] INVASIVE CV LAB;  Service: Cardiovascular;  Laterality: N/A;   ENDOMETRIAL ABLATION     ESOPHAGOGASTRODUODENOSCOPY (EGD) WITH PROPOFOL N/A 11/05/2020   Procedure: ESOPHAGOGASTRODUODENOSCOPY (EGD) WITH PROPOFOL;  Surgeon: Dolores Frame, MD;  [facility];  Service: Gastroenterology;  Laterality: N/A;   ESOPHAGOGASTRODUODENOSCOPY (EGD) WITH PROPOFOL N/A 09/18/2022   Procedure: ESOPHAGOGASTRODUODENOSCOPY (EGD) WITH PROPOFOL;  Surgeon: Dolores Frame, MD;  [facility];  Service: Gastroenterology;  Laterality: N/A;  10:30am;ASA 2   GIVENS CAPSULE STUDY N/A 11/18/2020   Procedure: GIVENS CAPSULE STUDY;  Surgeon: Dolores Frame, MD;  [facility]: [facility];  Service: Gastroenterology;  Laterality: N/A;  7:30   JOINT REPLACEMENT  LEFT HEART CATH AND CORONARY ANGIOGRAPHY N/A 01/10/2019   Procedure: LEFT HEART CATH AND CORONARY ANGIOGRAPHY;  Surgeon: Lyn Records, MD;  Location: [facility] INVASIVE CV LAB;  Service: Cardiovascular;  Laterality: N/A;   POLYPECTOMY  11/05/2020   Procedure: POLYPECTOMY;  Surgeon: Dolores Frame, MD;  [facility];  Service: Gastroenterology;;   POLYPECTOMY  09/18/2022   Procedure: POLYPECTOMY;  Surgeon: Dolores Frame, MD;  [facility]: [facility];  Service: Gastroenterology;;   TONSILLECTOMY     TOTAL HIP ARTHROPLASTY Right 12/04/2015   TOTAL HIP ARTHROPLASTY Right 12/04/2015   Procedure: TOTAL HIP ARTHROPLASTY ANTERIOR APPROACH;  Surgeon: Eldred Manges, MD;  Location: [facility] OR;  Service: Orthopedics;  Laterality: Right;   TOTAL HIP ARTHROPLASTY Left 01/24/2016   Procedure: TOTAL HIP ARTHROPLASTY ANTERIOR APPROACH;  Surgeon: Eldred Manges, MD;  Location: [facility] OR;  Service: Orthopedics;  Laterality: Left;  anterior approach   TUBAL LIGATION      SOCIAL HISTORY:  Social History   Socioeconomic History    Marital status: Married    Spouse name: Not on file   Number of children: Not on file   Years of education: Not on file   Highest education level: Not on file  Occupational History   Not on file  Tobacco Use   Smoking status: Never    Passive exposure: Never   Smokeless tobacco: Never  Vaping Use   Vaping status: Never Used  Substance and Sexual Activity   Alcohol use: Not Currently    Comment: "none since 2014"   Drug use: Not Currently    Comment: none in 6 minths   Sexual activity: Not Currently  Other Topics Concern   Not on file  Social History Narrative   Not on file   Social Determinants of Health   Financial Resource Strain: Low Risk  (10/24/2020)   Overall Financial Resource Strain (CARDIA)    Difficulty of Paying Living Expenses: Not hard at all  Food Insecurity: No Food Insecurity (10/24/2020)   Hunger Vital Sign    Worried About Running Out of Food in the Last Year: Never true    Ran Out of Food in the Last Year: Never true  Transportation Needs: No Transportation Needs (10/24/2020)   PRAPARE - Administrator, Civil Service (Medical): No    Lack of Transportation (Non-Medical): No  Physical Activity: Inactive (10/24/2020)   Exercise Vital Sign    Days of Exercise per Week: 0 days    Minutes of Exercise per Session: 0 min  Stress: No Stress Concern Present (10/24/2020)   [employer] of Occupational Health - Occupational Stress Questionnaire    Feeling of Stress : Only a little  Social Connections: Socially Integrated (10/24/2020)   Social Connection and Isolation Panel [NHANES]    Frequency of Communication with Friends and Family: More than three times a week    Frequency of Social Gatherings with Friends and Family: More than three times a week    Attends Religious Services: More than 4 times per year    Active Member of [employer] or Organizations: Yes    Attends Banker Meetings: More than 4 times per year    Marital Status:  Married  Catering manager Violence: Not At Risk (10/24/2020)   Humiliation, Afraid, Rape, and Kick questionnaire    Fear of Current or Ex-Partner: No    Emotionally Abused: No    Physically Abused: No    Sexually Abused: No  FAMILY HISTORY:  Family History  Problem Relation Age of Onset   Multiple sclerosis Mother    Arthritis/Rheumatoid Mother    Psoriasis Father    Atrial fibrillation Father    Hypertension Brother     CURRENT MEDICATIONS:  Outpatient Encounter Medications as of 01/22/2023  Medication Sig Note   amLODipine (NORVASC) 5 MG tablet Take 1 tablet (5 mg total) by mouth daily.    aspirin 81 MG tablet Take 81 mg by mouth daily.    B-D 3CC LUER-LOK SYR 25GX1" 25G X 1" 3 ML MISC Inject into the muscle.    Biotin [address] MCG TABS Take 10,000 mcg by mouth daily.    cyanocobalamin (VITAMIN B12) 1000 MCG/ML injection Inject 1000 mcg vitamin B12 intramuscularly every 7 days x 28 days, followed by monthly injections. 10/20/2022: Once a month   diazepam (VALIUM) 5 MG tablet TAKE ONE TABLET BY MOUTH THREE TIMES DAILY AS NEEDED    dicyclomine (BENTYL) 10 MG capsule Take 1 capsule (10 mg total) by mouth every 12 (twelve) hours as needed (abdominal pain).    DULoxetine (CYMBALTA) 60 MG capsule Take 1 capsule (60 mg total) by mouth at bedtime.    ergocalciferol (VITAMIN D2) 1.25 MG (50000 UT) capsule Take 50,000 Units by mouth once a week.    folic acid (FOLVITE) 1 MG tablet Take 1 mg by mouth every other day.    gabapentin (NEURONTIN) 600 MG tablet TAKE ONE TABLET BY MOUTH FOUR TIMES DAILY (Patient taking differently: Take 600 mg by mouth 3 (three) times daily.) 10/20/2022: Qid prn    levothyroxine (SYNTHROID) 112 MCG tablet Take 112 mcg by mouth daily before breakfast.    metFORMIN (GLUCOPHAGE-XR) 500 MG 24 hr tablet Take 1,000 mg by mouth 2 (two) times daily.    metoprolol tartrate (LOPRESSOR) 50 MG tablet TAKE ONE TABLET BY MOUTH TWICE DAILY    Needles & Syringes MISC For use to  administer vitamin B12 injections at home.    nitroGLYCERIN (NITROSTAT) 0.4 MG SL tablet Place 1 tablet (0.4 mg total) under the tongue every 5 (five) minutes as needed.    ondansetron (ZOFRAN-ODT) 4 MG disintegrating tablet Take 4 mg by mouth every 8 (eight) hours as needed for vomiting or nausea.    pantoprazole (PROTONIX) 40 MG tablet Take 1 tablet (40 mg total) by mouth 2 (two) times daily for 120 days, THEN 1 tablet (40 mg total) daily. 10/20/2022: One bid   Polyethyl Glycol-Propyl Glycol (SYSTANE OP) Place 1 drop into both eyes as needed (dry eyes).    rosuvastatin (CRESTOR) 5 MG tablet Take 5 mg by mouth every Monday, Wednesday, and Friday.    No facility-administered encounter medications on file as of 01/22/2023.    ALLERGIES:  Allergies  Allergen Reactions   Shellfish Allergy Anaphylaxis   Ciprofloxacin Hives   Copaxone [Glatiramer Acetate] Hives   Penicillins Hives and Rash    Bad headaches Did it involve swelling of the face/tongue/throat, SOB, or low BP? No Did it involve sudden or severe rash/hives, skin peeling, or any reaction on the inside of your mouth or nose? No Did you need to seek medical attention at a hospital or doctor's office? No When did it last happen?      6 - 7 years If all above answers are "NO", may proceed with cephalosporin use.    Vancomycin Itching    LABORATORY DATA:  I have reviewed the labs as listed.  CBC    Component Value Date/Time  WBC 6.2 01/15/2023 1422   RBC 4.77 01/15/2023 1422   HGB 14.2 01/15/2023 1422   HGB 11.9 09/18/2015 1155   HCT 42.2 01/15/2023 1422   HCT 34.5 09/18/2015 1155   PLT 175 01/15/2023 1422   PLT 215 09/18/2015 1155   MCV 88.5 01/15/2023 1422   MCV 84 09/18/2015 1155   MCH 29.8 01/15/2023 1422   MCHC 33.6 01/15/2023 1422   RDW 14.1 01/15/2023 1422   RDW 16.8 (H) 09/18/2015 1155   LYMPHSABS 1.5 01/15/2023 1422   LYMPHSABS 2.4 09/18/2015 1155   MONOABS 0.4 01/15/2023 1422   EOSABS 0.1 01/15/2023 1422    EOSABS 0.6 (H) 09/18/2015 1155   BASOSABS 0.0 01/15/2023 1422   BASOSABS 0.0 09/18/2015 1155      Latest Ref Rng & Units 01/15/2023    2:22 PM 08/27/2022    3:00 PM 07/27/2021    5:47 AM  CMP  Glucose 70 - 99 mg/dL 086   578   BUN 8 - 23 mg/dL 10   8   Creatinine 4.69 - 1.00 mg/dL 6.29   5.28   Sodium 413 - 145 mmol/L 136   143   Potassium 3.5 - 5.1 mmol/L 4.0   3.6   Chloride 98 - 111 mmol/L 102   109   CO2 22 - 32 mmol/L 23   26   Calcium 8.9 - 10.3 mg/dL 8.7   8.9   Total Protein 6.5 - 8.1 g/dL 7.1  6.0  6.5   Total Bilirubin 0.3 - 1.2 mg/dL 0.4  0.4  0.5   Alkaline Phos 38 - 126 U/L 52   54   AST 15 - 41 U/L 31  26  30    ALT 0 - 44 U/L 27  24  25      DIAGNOSTIC IMAGING:  I have independently reviewed the relevant imaging and discussed with the patient.   WRAP UP:  All questions were answered. The patient knows to call the clinic with any problems, questions or concerns.  Medical decision making: Moderate  Time spent on visit: I provided 20 minutes of non face-to-face telephone visit time during this encounter, and > 50% was spent counseling as documented under  my assessment & plan.   Mauro Kaufmann, NP  01/22/23 2:52 PM

## 2023-02-16 ENCOUNTER — Other Ambulatory Visit: Payer: Self-pay | Admitting: Nurse Practitioner

## 2023-04-23 ENCOUNTER — Ambulatory Visit: Payer: BC Managed Care – PPO | Attending: Cardiology | Admitting: Cardiology

## 2023-04-23 ENCOUNTER — Encounter: Payer: Self-pay | Admitting: Cardiology

## 2023-04-23 VITALS — BP 136/88 | HR 93 | Ht 62.0 in | Wt 196.6 lb

## 2023-04-23 DIAGNOSIS — E782 Mixed hyperlipidemia: Secondary | ICD-10-CM

## 2023-04-23 DIAGNOSIS — I25119 Atherosclerotic heart disease of native coronary artery with unspecified angina pectoris: Secondary | ICD-10-CM | POA: Diagnosis not present

## 2023-04-23 DIAGNOSIS — I10 Essential (primary) hypertension: Secondary | ICD-10-CM

## 2023-04-23 DIAGNOSIS — R002 Palpitations: Secondary | ICD-10-CM | POA: Diagnosis not present

## 2023-04-23 NOTE — Progress Notes (Signed)
Cardiology Office Note  Date: 04/23/2023   ID: Mary Strong, DOB 01/02/1961, MRN [id number]  History of Present Illness: Mary Strong is a 62 y.o. female last seen in June by Ms. Philis Nettle NP, I reviewed the note (our last encounter was in 2022).  She is here for a follow-up visit.  She does not report any increasing angina or nitroglycerin use.  She has gained weight over the last 6 months.  In discussing her diet, she has been drinking a consistent amount of sweet tea, not exercising at this point.  She does not report any significant palpitations, no dizziness or syncope.  I reviewed her medications.  Current cardiac regimen includes aspirin, Norvasc, Lopressor, Crestor, and as needed nitroglycerin.  She did talk with a pharmacist at [facility] about potentially starting Jardiance.  Ozempic would also be a reasonable option.  I reviewed interval testing in April and May, outlined below.  Physical Exam: VS:  BP 136/88 (BP Location: Left Arm, Cuff Size: Normal)   Pulse 93   Ht 5\' 2"  (1.575 m)   Wt 196 lb 9.6 oz (89.2 kg)   SpO2 99%   BMI 35.96 kg/m , BMI Body mass index is 35.96 kg/m.  Wt Readings from Last 3 Encounters:  04/23/23 196 lb 9.6 oz (89.2 kg)  10/20/22 190 lb 1.6 oz (86.2 kg)  10/19/22 189 lb 12.8 oz (86.1 kg)    General: Patient appears comfortable at rest. HEENT: Conjunctiva and lids normal. Neck: Supple, no elevated JVP or carotid bruits. Lungs: Clear to auscultation, nonlabored breathing at rest. Cardiac: Regular rate and rhythm, no S3 or significant systolic murmur, no pericardial rub.  ECG:  An ECG dated 08/17/2022 was personally reviewed today and demonstrated:  Sinus rhythm with incomplete right bundle branch block.  Labwork: January 2024: Cholesterol 146, triglycerides 340, HDL 31, LDL 62, TSH 1.19 01/15/2023: ALT 27; AST 31; BUN 10; Creatinine, Ser 0.93; Hemoglobin 14.2; Platelets 175; Potassium 4.0; Sodium 136   Other Studies Reviewed  Today:  Lexiscan Myoview 08/17/2022:   Findings are consistent with ischemia. The study is low risk.   No ST deviation was noted. The ECG was negative for ischemia.   LV perfusion is abnormal.  Mild breast attenuation noted.  Small, mild intensity, apical to basal inferolateral defect that is partially reversible and consistent with small ischemic territory.   Left ventricular function is normal. Nuclear stress EF: 71 %.   Small region of inferolateral ischemia as described, overall low risk with LVEF 71%.  Echocardiogram 09/01/2022:  1. Left ventricular ejection fraction, by estimation, is 65 to 70%. The  left ventricle has normal function. The left ventricle has no regional  wall motion abnormalities. There is mild asymmetric left ventricular  hypertrophy of the basal segment. Left  ventricular diastolic parameters are consistent with Grade II diastolic  dysfunction (pseudonormalization). The average left ventricular global  longitudinal strain is -21.5 %. The global longitudinal strain is normal.   2. Right ventricular systolic function is normal. The right ventricular  size is normal. There is normal pulmonary artery systolic pressure. The  estimated right ventricular systolic pressure is 18.5 mmHg.   3. The mitral valve is grossly normal. Trivial mitral valve  regurgitation.   4. The aortic valve is tricuspid. There is mild calcification of the  aortic valve. Aortic valve regurgitation is not visualized. Aortic valve  sclerosis is present, with no evidence of aortic valve stenosis. Aortic  valve mean gradient measures 5.0 mmHg.  5. The inferior vena cava is normal in size with greater than 50%  respiratory variability, suggesting right atrial pressure of 3 mmHg.   Cardiac monitor May 2024: ZIO monitor reviewed.  13 days, 19 hours analyzed.   Predominant rhythm is sinus with heart rate ranging from 52 bpm up to 152 bpm and average heart rate 82 bpm. There were rare PACs including  atrial couplets and triplets representing less than 1% total beats. There were occasional PVCs representing 1.6% total beats with otherwise rare ventricular couplets and limited episodes of ventricular bigeminy and trigeminy. Patient triggered episodes were generally associated with PVCs. There were a few brief episodes of PSVT, the longest of which was only 14 beats.  No sustained atrial arrhythmias. No pauses or high degree heart block.  Assessment and Plan:  1.  CAD status post DES to CTO RCA and DES to mid LAD September 2020.  LVEF 65 to 70% by echocardiogram in April.  Follow-up Lexiscan Myoview at that time showed a small region of inferolateral ischemia, overall low risk.  She reports no progressive angina or nitroglycerin use.  Plan to continue medical therapy including aspirin, Norvasc, Lopressor, and Crestor.  She also has type 2 diabetes mellitus, agree with consideration of Jardiance or potentially even Ozempic.  2.  Primary hypertension.  No change in current regimen.  3.  Mixed hyperlipidemia.  LDL 62 in January.  Continue Crestor.  4.  History of palpitations.  Cardiac monitor in May showed rare PACs and occasional PVCs with a few brief episodes of SVT.  She is asymptomatic on Lopressor.  Disposition:  Follow up  6 months.  Signed, Jonelle Sidle, M.D., F.A.C.C. [facility]

## 2023-04-23 NOTE — Patient Instructions (Addendum)

## 2023-06-07 DIAGNOSIS — E538 Deficiency of other specified B group vitamins: Secondary | ICD-10-CM | POA: Diagnosis not present

## 2023-06-07 DIAGNOSIS — E039 Hypothyroidism, unspecified: Secondary | ICD-10-CM | POA: Diagnosis not present

## 2023-06-07 DIAGNOSIS — E782 Mixed hyperlipidemia: Secondary | ICD-10-CM | POA: Diagnosis not present

## 2023-06-07 DIAGNOSIS — Z6841 Body Mass Index (BMI) 40.0 and over, adult: Secondary | ICD-10-CM | POA: Diagnosis not present

## 2023-06-07 DIAGNOSIS — K2100 Gastro-esophageal reflux disease with esophagitis, without bleeding: Secondary | ICD-10-CM | POA: Diagnosis not present

## 2023-06-09 ENCOUNTER — Other Ambulatory Visit: Payer: Self-pay | Admitting: Neurology

## 2023-06-10 ENCOUNTER — Other Ambulatory Visit: Payer: Self-pay

## 2023-07-08 ENCOUNTER — Other Ambulatory Visit: Payer: Self-pay | Admitting: Neurology

## 2023-07-22 ENCOUNTER — Other Ambulatory Visit: Payer: Self-pay

## 2023-07-22 DIAGNOSIS — E538 Deficiency of other specified B group vitamins: Secondary | ICD-10-CM

## 2023-07-22 DIAGNOSIS — D509 Iron deficiency anemia, unspecified: Secondary | ICD-10-CM

## 2023-07-23 ENCOUNTER — Other Ambulatory Visit: Payer: BC Managed Care – PPO

## 2023-07-23 ENCOUNTER — Inpatient Hospital Stay: Attending: Hematology

## 2023-07-23 DIAGNOSIS — K30 Functional dyspepsia: Secondary | ICD-10-CM | POA: Diagnosis not present

## 2023-07-23 DIAGNOSIS — E559 Vitamin D deficiency, unspecified: Secondary | ICD-10-CM | POA: Insufficient documentation

## 2023-07-23 DIAGNOSIS — E538 Deficiency of other specified B group vitamins: Secondary | ICD-10-CM | POA: Insufficient documentation

## 2023-07-23 DIAGNOSIS — D509 Iron deficiency anemia, unspecified: Secondary | ICD-10-CM | POA: Diagnosis present

## 2023-07-23 LAB — IRON AND TIBC
Iron: 82 ug/dL (ref 28–170)
Saturation Ratios: 21 % (ref 10.4–31.8)
TIBC: 386 ug/dL (ref 250–450)
UIBC: 304 ug/dL

## 2023-07-23 LAB — COMPREHENSIVE METABOLIC PANEL
ALT: 32 U/L (ref 0–44)
AST: 39 U/L (ref 15–41)
Albumin: 4.1 g/dL (ref 3.5–5.0)
Alkaline Phosphatase: 50 U/L (ref 38–126)
Anion gap: 11 (ref 5–15)
BUN: 10 mg/dL (ref 8–23)
CO2: 23 mmol/L (ref 22–32)
Calcium: 9.8 mg/dL (ref 8.9–10.3)
Chloride: 106 mmol/L (ref 98–111)
Creatinine, Ser: 0.92 mg/dL (ref 0.44–1.00)
GFR, Estimated: >60 mL/min (ref >60)
Glucose, Bld: 114 mg/dL — ABNORMAL HIGH (ref 70–99)
Potassium: 4.4 mmol/L (ref 3.5–5.1)
Sodium: 140 mmol/L (ref 135–145)
Total Bilirubin: 0.7 mg/dL (ref 0.0–1.2)
Total Protein: 7.6 g/dL (ref 6.5–8.1)

## 2023-07-23 LAB — CBC WITH DIFFERENTIAL/PLATELET
Abs Immature Granulocytes: 0.03 10*3/uL (ref 0.00–0.07)
Basophils Absolute: 0 10*3/uL (ref 0.0–0.1)
Basophils Relative: 0 %
Eosinophils Absolute: 0.1 10*3/uL (ref 0.0–0.5)
Eosinophils Relative: 1 %
HCT: 43.8 % (ref 36.0–46.0)
Hemoglobin: 14.6 g/dL (ref 12.0–15.0)
Immature Granulocytes: 0 %
Lymphocytes Relative: 19 %
Lymphs Abs: 1.4 10*3/uL (ref 0.7–4.0)
MCH: 29.4 pg (ref 26.0–34.0)
MCHC: 33.3 g/dL (ref 30.0–36.0)
MCV: 88.3 fL (ref 80.0–100.0)
Monocytes Absolute: 0.5 10*3/uL (ref 0.1–1.0)
Monocytes Relative: 7 %
Neutro Abs: 5.1 10*3/uL (ref 1.7–7.7)
Neutrophils Relative %: 73 %
Platelets: 218 10*3/uL (ref 150–400)
RBC: 4.96 MIL/uL (ref 3.87–5.11)
RDW: 14.4 % (ref 11.5–15.5)
WBC: 7.1 10*3/uL (ref 4.0–10.5)
nRBC: 0 % (ref 0.0–0.2)

## 2023-07-23 LAB — FOLATE: Folate: 27.4 ng/mL (ref >5.9)

## 2023-07-23 LAB — VITAMIN B12: Vitamin B-12: 187 pg/mL (ref 180–914)

## 2023-07-23 LAB — FERRITIN: Ferritin: 96 ng/mL (ref 11–307)

## 2023-07-27 LAB — METHYLMALONIC ACID, SERUM: Methylmalonic Acid, Quantitative: 241 nmol/L (ref 0–378)

## 2023-07-30 ENCOUNTER — Inpatient Hospital Stay: Payer: BC Managed Care – PPO | Admitting: Oncology

## 2023-07-30 VITALS — BP 127/84 | HR 72 | Temp 98.1°F | Resp 16 | Wt 183.9 lb

## 2023-07-30 DIAGNOSIS — D509 Iron deficiency anemia, unspecified: Secondary | ICD-10-CM

## 2023-07-30 DIAGNOSIS — E538 Deficiency of other specified B group vitamins: Secondary | ICD-10-CM

## 2023-07-30 NOTE — Progress Notes (Signed)
 [facility] [address], [place] [address]   CLINIC:  Medical Oncology/Hematology  PCP:  Richardean Chimera, MD [address] [place] [address]    [phone]  REASON FOR VISIT:  Follow-up for iron deficiency anemia   PRIOR THERAPY: Oral iron tablet (unable to tolerate)   CURRENT THERAPY: Intermittent IV iron (Injectafer x2 on 11/01/2020 and 11/08/2020)  INTERVAL HISTORY:   Mary Strong 63 y.o. female returns for routine follow-up of iron deficiency anemia, suspected to be secondary to malabsorption.  She was evaluated by me last on 01/22/2023.   She receives intermittent IV iron last given on 07/31/2022.  She is unable to tolerate oral iron.  She denies any active bleeding.  Since her last visit, she denies any hospitalizations, surgeries or changes in her baseline health.   Reports chronic dizziness, headaches, numbness and tingling, chest pain that is intermittent and constipation with nausea.  Energy and appetite levels are 25%.     She is currently on folic acid every other day, B12 injections monthly and vitamin D supplements 50,000 units weekly.  She gives herself monthly B12 injections.  Reports good toleration of supplements at this time.   ASSESSMENT & PLAN:   Iron deficiency anemia - Etiology suspected to be malabsorption in the setting of long-term PPI use - Extensive GI work-up in July 2022 (EGD, colonoscopy, capsule study) did not reveal any source of blood loss - She is unable to tolerate oral iron supplements due to side effects - Received IV iron repletion with Injectafer x2 on 11/01/2020 and 11/08/2020 and with IV Feraheme on 07/31/2022. -Labs from 07/23/2023 show ferritin of 96, iron saturation 21% with a normal TIBC.  Differential is normal.  Hemoglobin is 14.6. - No obvious bleeding such as bright red blood per rectum, melena, or epistaxis     -Given extreme fatigue, we discussed 1 dose of IV Feraheme 510 mg to see if this helps her energy  levels. -Recommend follow-up in 6 months with labs a few days before and in office visit.  2.  Folic acid deficiency - Folic acid deficiency noted in September 2023 with folate 2.8 - Has been taking the 400 mcg folic acid supplement daily since September 2023 and was decreased to every other day in March 2024. -Most recent folate level is 27.4. -Continue 400 mcg folic acid every other day.  3.  Vitamin B12 deficiency - Most recent labs from 07/23/2023 show a B12 level of 187 and MMA of 241. -She continues to give herself monthly B12 injections.  4.  Indigestion - Follows with Dr. Levon Hedger for her gastroenterological complaints - Patient reports that she has difficulty tolerating oral vitamin supplements, as they cause significant indigestion and upset stomach. - She also reports that for the past year she has been unable to tolerate meat and some dairy products, reports that she feels that she gets full easily. -Will discuss with her PCP possible alpha gal testing in the future.  5.  Other history - She is a retired Engineer, civil (consulting), previously worked at [employer] and [employer] - PMH is also positive for MS  6. Vitamin D deficiency- -On weekly Vitamin D 50,000 units  -She has follow-up with PCP for repeat vitamin D testing.  PLAN SUMMARY: >> Continue B12 injections monthly.  Patient will give to her self. >> Recommend 1 dose of IV Feraheme 510 mg.  >> Recommend labs in 6 months ( CBC, CMP, ferritin, iron/TIBC, folate, B12,  MMA) and office visit a few days later.       REVIEW OF SYSTEMS:   Review of Systems  Constitutional:  Positive for fatigue.  Cardiovascular:  Positive for chest pain.  Gastrointestinal:  Positive for constipation and nausea.  Neurological:  Positive for dizziness, headaches and numbness.     PHYSICAL EXAM:  ECOG PERFORMANCE STATUS: 1 - Symptomatic but completely ambulatory  Vitals:   07/30/23 1252  BP: 127/84  Pulse: 72  Resp: 16  Temp: 98.1 F (36.7 C)   SpO2: 99%   Filed Weights   07/30/23 1252  Weight: 183 lb 13.8 oz (83.4 kg)   Physical Exam Constitutional:      Appearance: Normal appearance.  Cardiovascular:     Rate and Rhythm: Normal rate and regular rhythm.  Pulmonary:     Effort: Pulmonary effort is normal.     Breath sounds: Normal breath sounds.  Abdominal:     General: Bowel sounds are normal.     Palpations: Abdomen is soft.  Musculoskeletal:        General: No swelling. Normal range of motion.  Neurological:     Mental Status: She is alert and oriented to person, place, and time. Mental status is at baseline.     PAST MEDICAL/SURGICAL HISTORY:  Past Medical History:  Diagnosis Date   Anxiety    Arthritis    CAD (coronary artery disease)    Heavily calcified vessels, DES to CTO RCA and DES to mid LAD September 2020   Constipation    Depression    Essential hypertension    Fatty liver    GERD (gastroesophageal reflux disease)    History of migraine    History of sleep apnea    Currently not using CPAP   Hypothyroidism    Multiple sclerosis (HCC)    Neurogenic bladder    Neuropathy    Palpitations    Restless leg syndrome    Skin cancer    Basal cell cancer on forehead, strong family hx of adenocarcinoma   Type 2 diabetes mellitus (HCC)    Past Surgical History:  Procedure Laterality Date   BIOPSY  11/05/2020   Procedure: BIOPSY;  Surgeon: Dolores Frame, MD;  [facility];  Service: Gastroenterology;;   BIOPSY  09/18/2022   Procedure: BIOPSY;  Surgeon: Dolores Frame, MD;  [facility];  Service: Gastroenterology;;   CESAREAN SECTION     x 2   CHOLECYSTECTOMY     COLONOSCOPY WITH PROPOFOL N/A 11/05/2020   Procedure: COLONOSCOPY WITH PROPOFOL;  Surgeon: Dolores Frame, MD;  [facility];  Service: Gastroenterology;  Laterality: N/A;  12:30   CORONARY ATHERECTOMY N/A 01/18/2019   Procedure: CORONARY ATHERECTOMY;  Surgeon: Swaziland, Peter  M, MD;  Location: [facility] INVASIVE CV LAB;  Service: Cardiovascular;  Laterality: N/A;   CORONARY CTO INTERVENTION N/A 01/18/2019   Procedure: CORONARY CTO INTERVENTION;  Surgeon: Swaziland, Peter M, MD;  Location: [facility] INVASIVE CV LAB;  Service: Cardiovascular;  Laterality: N/A;   CORONARY STENT INTERVENTION N/A 01/18/2019   Procedure: CORONARY STENT INTERVENTION;  Surgeon: Swaziland, Peter M, MD;  Location: [facility] INVASIVE CV LAB;  Service: Cardiovascular;  Laterality: N/A;   ENDOMETRIAL ABLATION     ESOPHAGOGASTRODUODENOSCOPY (EGD) WITH PROPOFOL N/A 11/05/2020   Procedure: ESOPHAGOGASTRODUODENOSCOPY (EGD) WITH PROPOFOL;  Surgeon: Dolores Frame, MD;  [facility];  Service: Gastroenterology;  Laterality: N/A;   ESOPHAGOGASTRODUODENOSCOPY (EGD) WITH PROPOFOL N/A 09/18/2022   Procedure:  ESOPHAGOGASTRODUODENOSCOPY (EGD) WITH PROPOFOL;  Surgeon: Marguerita Merles, Reuel Boom, MD;  [facility];  Service: Gastroenterology;  Laterality: N/A;  10:30am;ASA 2   GIVENS CAPSULE STUDY N/A 11/18/2020   Procedure: GIVENS CAPSULE STUDY;  Surgeon: Dolores Frame, MD;  [facility];  Service: Gastroenterology;  Laterality: N/A;  7:30   JOINT REPLACEMENT     LEFT HEART CATH AND CORONARY ANGIOGRAPHY N/A 01/10/2019   Procedure: LEFT HEART CATH AND CORONARY ANGIOGRAPHY;  Surgeon: Lyn Records, MD;  Location: [facility] INVASIVE CV LAB;  Service: Cardiovascular;  Laterality: N/A;   POLYPECTOMY  11/05/2020   Procedure: POLYPECTOMY;  Surgeon: Dolores Frame, MD;  [facility];  Service: Gastroenterology;;   POLYPECTOMY  09/18/2022   Procedure: POLYPECTOMY;  Surgeon: Dolores Frame, MD;  [facility];  Service: Gastroenterology;;   TONSILLECTOMY     TOTAL HIP ARTHROPLASTY Right 12/04/2015   TOTAL HIP ARTHROPLASTY Right 12/04/2015   Procedure: TOTAL HIP ARTHROPLASTY ANTERIOR APPROACH;  Surgeon: Eldred Manges, MD;  Location: [facility] OR;  Service: Orthopedics;   Laterality: Right;   TOTAL HIP ARTHROPLASTY Left 01/24/2016   Procedure: TOTAL HIP ARTHROPLASTY ANTERIOR APPROACH;  Surgeon: Eldred Manges, MD;  Location: [facility] OR;  Service: Orthopedics;  Laterality: Left;  anterior approach   TUBAL LIGATION      SOCIAL HISTORY:  Social History   Socioeconomic History   Marital status: Married    Spouse name: Not on file   Number of children: Not on file   Years of education: Not on file   Highest education level: Not on file  Occupational History   Not on file  Tobacco Use   Smoking status: Never    Passive exposure: Never   Smokeless tobacco: Never  Vaping Use   Vaping status: Never Used  Substance and Sexual Activity   Alcohol use: Not Currently    Comment: "none since 2014"   Drug use: Not Currently    Comment: none in 6 minths   Sexual activity: Not Currently  Other Topics Concern   Not on file  Social History Narrative   Not on file   Social Drivers of Health   Financial Resource Strain: Low Risk  (10/24/2020)   Overall Financial Resource Strain (CARDIA)    Difficulty of Paying Living Expenses: Not hard at all  Food Insecurity: No Food Insecurity (10/24/2020)   Hunger Vital Sign    Worried About Running Out of Food in the Last Year: Never true    Ran Out of Food in the Last Year: Never true  Transportation Needs: No Transportation Needs (10/24/2020)   PRAPARE - Administrator, Civil Service (Medical): No    Lack of Transportation (Non-Medical): No  Physical Activity: Inactive (10/24/2020)   Exercise Vital Sign    Days of Exercise per Week: 0 days    Minutes of Exercise per Session: 0 min  Stress: No Stress Concern Present (10/24/2020)   [employer] of Occupational Health - Occupational Stress Questionnaire    Feeling of Stress : Only a little  Social Connections: Socially Integrated (10/24/2020)   Social Connection and Isolation Panel [NHANES]    Frequency of Communication with Friends and Family: More than three  times a week    Frequency of Social Gatherings with Friends and Family: More than three times a week    Attends Religious Services: More than 4 times per year    Active Member of Clubs or Organizations: Yes  Attends Banker Meetings: More than 4 times per year    Marital Status: Married  Catering manager Violence: Not At Risk (10/24/2020)   Humiliation, Afraid, Rape, and Kick questionnaire    Fear of Current or Ex-Partner: No    Emotionally Abused: No    Physically Abused: No    Sexually Abused: No    FAMILY HISTORY:  Family History  Problem Relation Age of Onset   Multiple sclerosis Mother    Arthritis/Rheumatoid Mother    Psoriasis Father    Atrial fibrillation Father    Hypertension Brother     CURRENT MEDICATIONS:  Outpatient Encounter Medications as of 07/30/2023  Medication Sig Note   amLODipine (NORVASC) 5 MG tablet Take 1 tablet by mouth once daily    aspirin 81 MG tablet Take 81 mg by mouth daily.    B-D 3CC LUER-LOK SYR 25GX1" 25G X 1" 3 ML MISC Inject into the muscle.    Biotin 16109 MCG TABS Take 10,000 mcg by mouth daily.    cyanocobalamin (VITAMIN B12) 1000 MCG/ML injection Inject 1000 mcg vitamin B12 intramuscularly every 7 days x 28 days, followed by monthly injections. 10/20/2022: Once a month   diazepam (VALIUM) 5 MG tablet TAKE ONE TABLET BY MOUTH THREE TIMES DAILY AS NEEDED    DULoxetine (CYMBALTA) 60 MG capsule Take 1 capsule (60 mg total) by mouth at bedtime.    ergocalciferol (VITAMIN D2) 1.25 MG (50000 UT) capsule Take 50,000 Units by mouth once a week.    folic acid (FOLVITE) 1 MG tablet Take 1 mg by mouth every other day.    gabapentin (NEURONTIN) 600 MG tablet Take 1 tablet by mouth 4 times daily    LANTUS SOLOSTAR 100 UNIT/ML Solostar Pen SMARTSIG:10 Unit(s) SUB-Q Daily    levothyroxine (SYNTHROID) 112 MCG tablet Take 112 mcg by mouth daily before breakfast.    metFORMIN (GLUCOPHAGE-XR) 500 MG 24 hr tablet Take 1,000 mg by mouth 2 (two)  times daily.    metoprolol tartrate (LOPRESSOR) 50 MG tablet TAKE ONE TABLET BY MOUTH TWICE DAILY    Needles & Syringes MISC For use to administer vitamin B12 injections at home.    nitroGLYCERIN (NITROSTAT) 0.4 MG SL tablet Place 1 tablet (0.4 mg total) under the tongue every 5 (five) minutes as needed.    pantoprazole (PROTONIX) 20 MG tablet Take 20 mg by mouth daily.    Polyethyl Glycol-Propyl Glycol (SYSTANE OP) Place 1 drop into both eyes as needed (dry eyes).    rosuvastatin (CRESTOR) 5 MG tablet Take 5 mg by mouth every Monday, Wednesday, and Friday.    Semaglutide (OZEMPIC, 0.25 OR 0.5 MG/DOSE, [place]) Inject 0.5 mg into the skin.    No facility-administered encounter medications on file as of 07/30/2023.    ALLERGIES:  Allergies  Allergen Reactions   Shellfish Allergy Anaphylaxis   Ciprofloxacin Hives   Copaxone [Glatiramer Acetate] Hives   Penicillins Hives and Rash    Bad headaches Did it involve swelling of the face/tongue/throat, SOB, or low BP? No Did it involve sudden or severe rash/hives, skin peeling, or any reaction on the inside of your mouth or nose? No Did you need to seek medical attention at a hospital or doctor's office? No When did it last happen?      6 - 7 years If all above answers are "NO", may proceed with cephalosporin use.    Vancomycin Itching    LABORATORY DATA:  I have reviewed the labs as listed.  CBC    Component Value Date/Time   WBC 7.1 07/23/2023 1135   RBC 4.96 07/23/2023 1135   HGB 14.6 07/23/2023 1135   HGB 11.9 09/18/2015 1155   HCT 43.8 07/23/2023 1135   HCT 34.5 09/18/2015 1155   PLT 218 07/23/2023 1135   PLT 215 09/18/2015 1155   MCV 88.3 07/23/2023 1135   MCV 84 09/18/2015 1155   MCH 29.4 07/23/2023 1135   MCHC 33.3 07/23/2023 1135   RDW 14.4 07/23/2023 1135   RDW 16.8 (H) 09/18/2015 1155   LYMPHSABS 1.4 07/23/2023 1135   LYMPHSABS 2.4 09/18/2015 1155   MONOABS 0.5 07/23/2023 1135   EOSABS 0.1 07/23/2023 1135   EOSABS 0.6  (H) 09/18/2015 1155   BASOSABS 0.0 07/23/2023 1135   BASOSABS 0.0 09/18/2015 1155      Latest Ref Rng & Units 07/23/2023   11:35 AM 01/15/2023    2:22 PM 08/27/2022    3:00 PM  CMP  Glucose 70 - 99 mg/dL 604  540    BUN 8 - 23 mg/dL 10  10    Creatinine 9.81 - 1.00 mg/dL 1.91  4.78    Sodium 295 - 145 mmol/L 140  136    Potassium 3.5 - 5.1 mmol/L 4.4  4.0    Chloride 98 - 111 mmol/L 106  102    CO2 22 - 32 mmol/L 23  23    Calcium 8.9 - 10.3 mg/dL 9.8  8.7    Total Protein 6.5 - 8.1 g/dL 7.6  7.1  6.0   Total Bilirubin 0.0 - 1.2 mg/dL 0.7  0.4  0.4   Alkaline Phos 38 - 126 U/L 50  52    AST 15 - 41 U/L 39  31  26   ALT 0 - 44 U/L 32  27  24     DIAGNOSTIC IMAGING:  I have independently reviewed the relevant imaging and discussed with the patient.   WRAP UP:  All questions were answered. The patient knows to call the clinic with any problems, questions or concerns.  Medical decision making: Moderate  I spent 20 minutes dedicated to the care of this patient (face-to-face and non-face-to-face) on the date of the encounter to include what is described in the assessment and plan.  Mauro Kaufmann, NP  07/30/23 12:58 PM

## 2023-08-05 ENCOUNTER — Inpatient Hospital Stay

## 2023-08-12 ENCOUNTER — Other Ambulatory Visit: Payer: Self-pay | Admitting: Neurology

## 2023-08-12 NOTE — Telephone Encounter (Signed)
 Last seen on 06/11/22 No 6 month follow up scheduled

## 2023-08-13 ENCOUNTER — Inpatient Hospital Stay: Attending: Oncology

## 2023-08-13 VITALS — BP 109/69 | HR 68 | Temp 98.2°F | Resp 18

## 2023-08-13 DIAGNOSIS — D509 Iron deficiency anemia, unspecified: Secondary | ICD-10-CM | POA: Diagnosis present

## 2023-08-13 MED ORDER — SODIUM CHLORIDE 0.9 % IV SOLN: Freq: Once | INTRAVENOUS | Status: AC

## 2023-08-13 MED ORDER — SODIUM CHLORIDE 0.9 % IV SOLN
510.0000 mg | Freq: Once | INTRAVENOUS | Status: AC
Start: 2023-08-13 — End: 2023-08-13
  Administered 2023-08-13: 510 mg via INTRAVENOUS
  Filled 2023-08-13: qty 510

## 2023-08-13 MED ORDER — ACETAMINOPHEN 325 MG PO TABS
650.0000 mg | ORAL_TABLET | Freq: Once | ORAL | Status: AC
  Administered 2023-08-13: 650 mg via ORAL
  Filled 2023-08-13: qty 2

## 2023-08-13 MED ORDER — CETIRIZINE HCL 10 MG PO TABS
10.0000 mg | ORAL_TABLET | Freq: Once | ORAL | Status: AC
  Administered 2023-08-13: 10 mg via ORAL
  Filled 2023-08-13: qty 1

## 2023-08-13 NOTE — Patient Instructions (Signed)
 [facility] [place] - A DEPT OF [facility]  Discharge Instructions: Thank you for choosing [facility] to provide your oncology and hematology care.  If you have a lab appointment with the Cancer Center - please note that after April 8th, 2024, all labs will be drawn in the cancer center.  You do not have to check in or register with the main entrance as you have in the past but will complete your check-in in the cancer center.  Wear comfortable clothing and clothing appropriate for easy access to any Portacath or PICC line.   We strive to give you quality time with your provider. You may need to reschedule your appointment if you arrive late (15 or more minutes).  Arriving late affects you and other patients whose appointments are after yours.  Also, if you miss three or more appointments without notifying the office, you may be dismissed from the clinic at the provider's discretion.      For prescription refill requests, have your pharmacy contact our office and allow 72 hours for refills to be completed.    Today you received the following feraheme iron infusion   To help prevent nausea and vomiting after your treatment, we encourage you to take your nausea medication as directed.  BELOW ARE SYMPTOMS THAT SHOULD BE REPORTED IMMEDIATELY: *FEVER GREATER THAN 100.4 F (38 C) OR HIGHER *CHILLS OR SWEATING *NAUSEA AND VOMITING THAT IS NOT CONTROLLED WITH YOUR NAUSEA MEDICATION *UNUSUAL SHORTNESS OF BREATH *UNUSUAL BRUISING OR BLEEDING *URINARY PROBLEMS (pain or burning when urinating, or frequent urination) *BOWEL PROBLEMS (unusual diarrhea, constipation, pain near the anus) TENDERNESS IN MOUTH AND THROAT WITH OR WITHOUT PRESENCE OF ULCERS (sore throat, sores in mouth, or a toothache) UNUSUAL RASH, SWELLING OR PAIN  UNUSUAL VAGINAL DISCHARGE OR ITCHING   Items with * indicate a potential emergency and should be followed up as soon as possible or go to  the Emergency Department if any problems should occur.  Please show the CHEMOTHERAPY ALERT CARD or IMMUNOTHERAPY ALERT CARD at check-in to the Emergency Department and triage nurse.  Should you have questions after your visit or need to cancel or reschedule your appointment, please contact [facility] [place] - A DEPT OF [facility] [phone]  and follow the prompts.  Office hours are 8:00 a.m. to 4:30 p.m. Monday - Friday. Please note that voicemails left after 4:00 p.m. may not be returned until the following business day.  We are closed weekends and major holidays. You have access to a nurse at all times for urgent questions. Please call the main number to the clinic [phone] and follow the prompts.  For any non-urgent questions, you may also contact your provider using MyChart. We now offer e-Visits for anyone 58 and older to request care online for non-urgent symptoms. For details visit [email or web address].   Also download the MyChart app! Go to the app store, search "MyChart", open the app, select [place], and log in with your MyChart username and password.

## 2023-08-13 NOTE — Progress Notes (Signed)
Feraheme given per orders. Patient tolerated it well without problems. Vitals stable and discharged home from clinic ambulatory. Follow up as scheduled.  

## 2023-09-13 ENCOUNTER — Other Ambulatory Visit: Payer: Self-pay | Admitting: Neurology

## 2023-09-13 NOTE — Telephone Encounter (Signed)
 Last seen on 06/11/22 No follow up scheduled

## 2023-10-03 ENCOUNTER — Other Ambulatory Visit (INDEPENDENT_AMBULATORY_CARE_PROVIDER_SITE_OTHER): Payer: Self-pay | Admitting: Gastroenterology

## 2023-10-04 NOTE — Telephone Encounter (Signed)
 Needs ov last seen 10/20/2022.

## 2023-10-07 ENCOUNTER — Other Ambulatory Visit: Payer: Self-pay | Admitting: Neurology

## 2023-10-21 ENCOUNTER — Ambulatory Visit: Payer: BC Managed Care – PPO | Admitting: Nurse Practitioner

## 2023-10-25 ENCOUNTER — Ambulatory Visit (INDEPENDENT_AMBULATORY_CARE_PROVIDER_SITE_OTHER): Payer: BC Managed Care – PPO | Admitting: Gastroenterology

## 2023-11-07 ENCOUNTER — Other Ambulatory Visit: Payer: Self-pay | Admitting: Neurology

## 2023-11-08 NOTE — Telephone Encounter (Signed)
 Last seen on 06/11/22 No follow up scheduled    Dispensed Days Supply Quantity Provider Pharmacy  GABAPENTIN  600MG     TAB 09/13/2023 30 120 each Sater, Charlie LABOR, MD [facility] [phone] .SABRASABRA

## 2023-11-15 ENCOUNTER — Other Ambulatory Visit: Payer: Self-pay

## 2023-11-15 MED ORDER — AMLODIPINE BESYLATE 5 MG PO TABS: 5.0000 mg | ORAL_TABLET | Freq: Every day | ORAL | 1 refills | Status: DC

## 2023-11-17 ENCOUNTER — Other Ambulatory Visit: Payer: Self-pay | Admitting: Nurse Practitioner

## 2024-01-11 IMAGING — DX DG CHEST 2V
1 series · 1 of 1 positions shown · non-contrast
Comparison: PA Lat 06/16/2018.

CLINICAL DATA: Shortness of breath and chest pain.

EXAM:
CHEST - 2 VIEW

[chest ap]
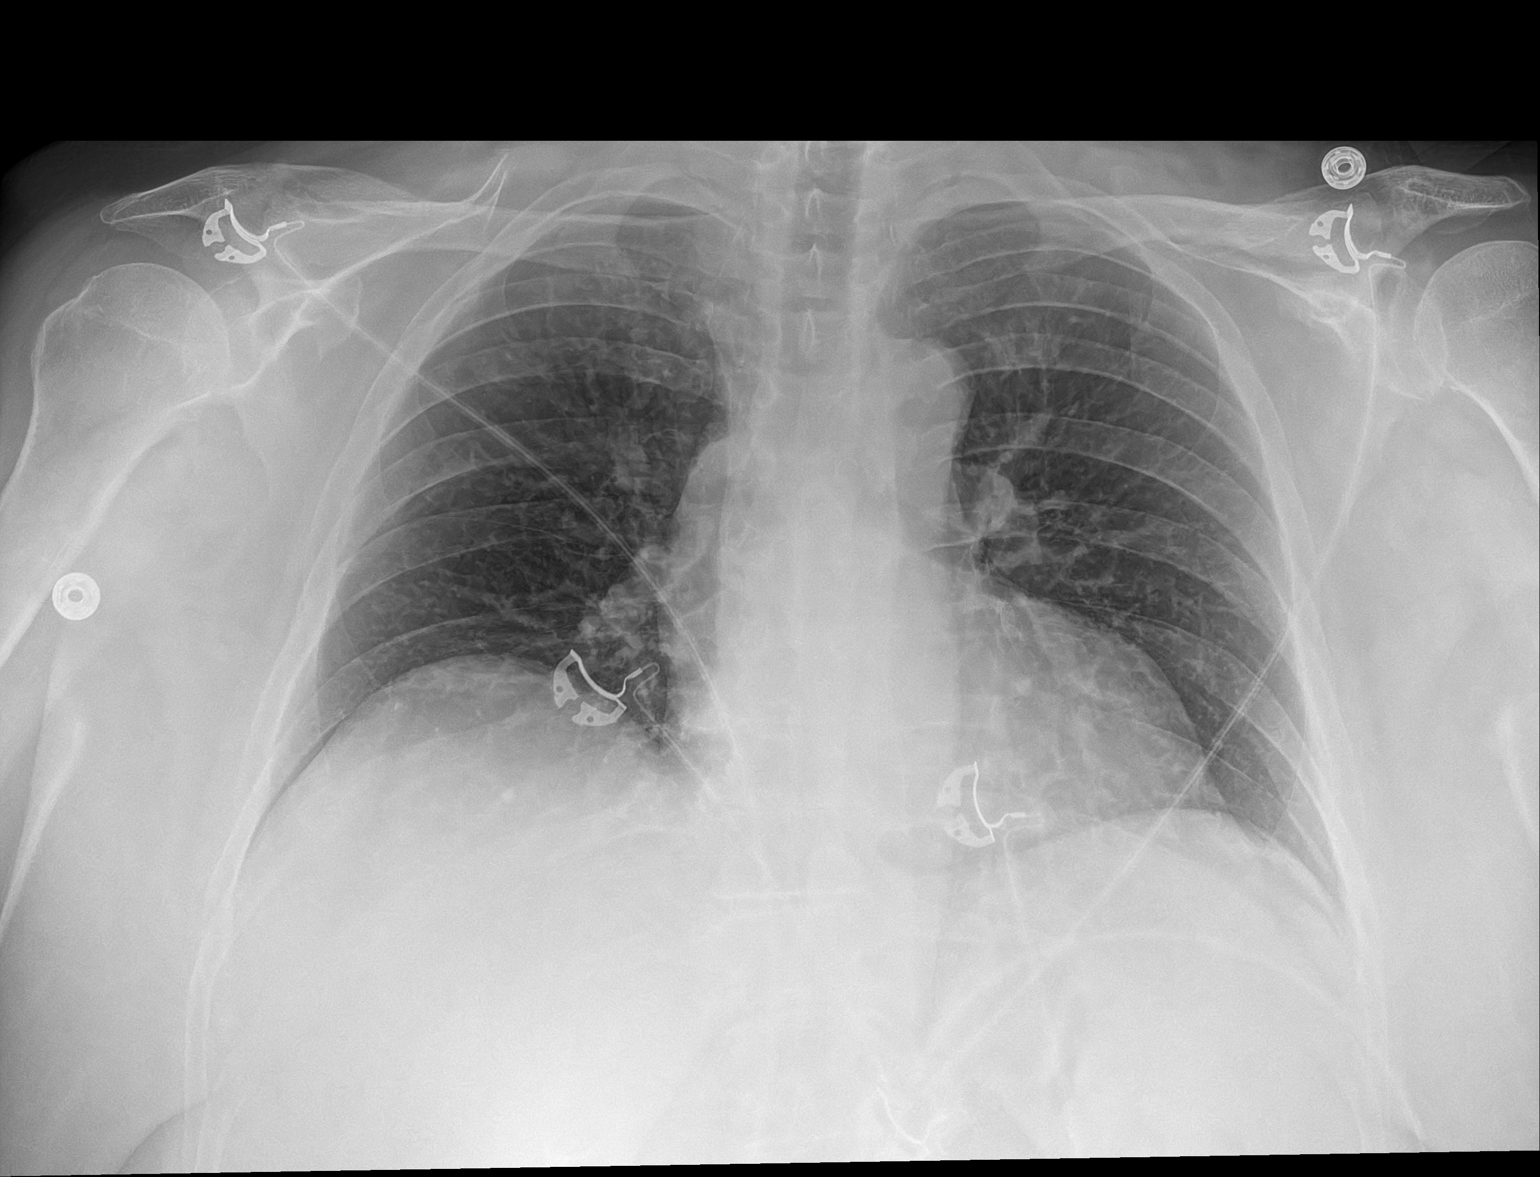

[1 of 1 positions shown; findings below may reference images not displayed]

FINDINGS: Cardiac size is upper-normal. There is either heavy calcification or
prior stenting of the LAD coronary artery.

Stable mediastinum. Early atherosclerosis of the aorta. No vascular
congestion is seen.

The lungs hypoinflated but generally clear, with limited view of the
bases and mild chronic elevation right hemidiaphragm.

No pleural effusion is evident.  Intact thoracic cage.
IMPRESSION: Hypoinflated study. No evidence of acute chest process with limited
view of the bases.

## 2024-01-18 ENCOUNTER — Encounter: Payer: Self-pay | Admitting: Cardiology

## 2024-01-18 ENCOUNTER — Ambulatory Visit: Attending: Cardiology | Admitting: Cardiology

## 2024-01-18 ENCOUNTER — Encounter: Payer: Self-pay | Admitting: *Deleted

## 2024-01-18 VITALS — BP 128/80 | HR 69 | Ht 62.0 in | Wt 187.6 lb

## 2024-01-18 DIAGNOSIS — E782 Mixed hyperlipidemia: Secondary | ICD-10-CM

## 2024-01-18 DIAGNOSIS — I25119 Atherosclerotic heart disease of native coronary artery with unspecified angina pectoris: Secondary | ICD-10-CM

## 2024-01-18 DIAGNOSIS — R002 Palpitations: Secondary | ICD-10-CM

## 2024-01-18 DIAGNOSIS — I10 Essential (primary) hypertension: Secondary | ICD-10-CM | POA: Diagnosis not present

## 2024-01-18 DIAGNOSIS — R079 Chest pain, unspecified: Secondary | ICD-10-CM

## 2024-01-18 MED ORDER — NITROGLYCERIN 0.4 MG SL SUBL: 0.4000 mg | SUBLINGUAL_TABLET | SUBLINGUAL | 2 refills | Status: AC | PRN

## 2024-01-18 MED ORDER — GABAPENTIN 600 MG PO TABS: 600.0000 mg | ORAL_TABLET | Freq: Four times a day (QID) | ORAL | Status: AC | PRN

## 2024-01-18 NOTE — Patient Instructions (Addendum)

## 2024-01-18 NOTE — Progress Notes (Signed)
    Cardiology Office Note  Date: 01/18/2024   ID: DYMPHNA WADLEY, DOB [birth year], MRN [id number]  History of Present Illness: Mary Strong is a 63 y.o. female last seen in December 2024.  She is here for a follow-up visit.  She has been under a lot of emotional stress, her mother is dealing with a lot of health challenges.  She has experienced angina with emotional stress, although not having to use nitroglycerin  with any regularity.  She needs a refill for a fresh bottle.  No increasing dyspnea with typical activities.  She does not report any significant palpitations.  Medications reviewed.  She reports compliance with her regimen.  Continues to follow with Dr. Toribio.  I reviewed her ECG today which shows sinus rhythm with incomplete right bundle branch block.  Physical Exam: VS:  BP 128/80   Pulse 69   Ht 5' 2 (1.575 m)   Wt 187 lb 9.6 oz (85.1 kg)   SpO2 99%   BMI 34.31 kg/m , BMI Body mass index is 34.31 kg/m.  Wt Readings from Last 3 Encounters:  01/18/24 187 lb 9.6 oz (85.1 kg)  07/30/23 183 lb 13.8 oz (83.4 kg)  04/23/23 196 lb 9.6 oz (89.2 kg)    General: Patient appears comfortable at rest. HEENT: Conjunctiva and lids normal. Neck: Supple, no elevated JVP or carotid bruits. Lungs: Clear to auscultation, nonlabored breathing at rest. Cardiac: Regular rate and rhythm, no S3 or significant systolic murmur.  ECG:  An ECG dated 08/17/2022 was personally reviewed today and demonstrated:  Sinus rhythm with IVCD.  Labwork: 07/23/2023: ALT 32; AST 39; BUN 10; Creatinine, Ser 0.92; Hemoglobin 14.6; Platelets 218; Potassium 4.4; Sodium 140   Other Studies Reviewed Today:  No interval cardiac testing for review today.  Assessment and Plan:  1.  CAD status post DES to CTO RCA and DES to mid LAD September 2020.  LVEF 65 to 70% by echocardiogram in April 2024.  Follow-up Lexiscan  Myoview  at that time showed a small region of inferolateral ischemia, overall low risk.   ECG reviewed today.  She does not report any accelerating angina or increasing nitroglycerin  requirement.  Plan to refill fresh bottle.  Continue aspirin  81 mg daily, Crestor  5 mg 3 times a week, and as needed nitroglycerin .   2.  Primary hypertension.  Blood pressure control is adequate today.  Continue Norvasc  5 mg daily.   3.  Mixed hyperlipidemia.  LDL 62 in January 2024.  Requesting interval lab work from PCP.  Continue Crestor  5 mg 3 times a week for now.   4.  History of palpitations.  Cardiac monitor in May 2024 showed rare PACs and occasional PVCs with a few brief episodes of SVT.  She does not report any interval symptoms and continues on Lopressor  50 mg twice daily.   Disposition:  Follow up 1 year.  Signed, Jayson JUDITHANN Sierras, M.D., F.A.C.C. [facility]

## 2024-01-28 ENCOUNTER — Inpatient Hospital Stay: Attending: Oncology

## 2024-01-28 DIAGNOSIS — D509 Iron deficiency anemia, unspecified: Secondary | ICD-10-CM | POA: Diagnosis present

## 2024-01-28 DIAGNOSIS — E538 Deficiency of other specified B group vitamins: Secondary | ICD-10-CM | POA: Insufficient documentation

## 2024-01-28 LAB — COMPREHENSIVE METABOLIC PANEL WITH GFR
ALT: 35 U/L (ref 0–44)
AST: 25 U/L (ref 15–41)
Albumin: 3.8 g/dL (ref 3.5–5.0)
Alkaline Phosphatase: 62 U/L (ref 38–126)
Anion gap: 10 (ref 5–15)
BUN: 13 mg/dL (ref 8–23)
CO2: 27 mmol/L (ref 22–32)
Calcium: 9.2 mg/dL (ref 8.9–10.3)
Chloride: 105 mmol/L (ref 98–111)
Creatinine, Ser: 1.07 mg/dL — ABNORMAL HIGH (ref 0.44–1.00)
GFR, Estimated: 59 mL/min — ABNORMAL LOW (ref >60)
Glucose, Bld: 146 mg/dL — ABNORMAL HIGH (ref 70–99)
Potassium: 4.8 mmol/L (ref 3.5–5.1)
Sodium: 142 mmol/L (ref 135–145)
Total Bilirubin: 0.7 mg/dL (ref 0.0–1.2)
Total Protein: 7.2 g/dL (ref 6.5–8.1)

## 2024-01-28 LAB — CBC WITH DIFFERENTIAL/PLATELET
Abs Immature Granulocytes: 0.04 K/uL (ref 0.00–0.07)
Basophils Absolute: 0 K/uL (ref 0.0–0.1)
Basophils Relative: 0 %
Eosinophils Absolute: 0.3 K/uL (ref 0.0–0.5)
Eosinophils Relative: 4 %
HCT: 43.4 % (ref 36.0–46.0)
Hemoglobin: 14.5 g/dL (ref 12.0–15.0)
Immature Granulocytes: 1 %
Lymphocytes Relative: 21 %
Lymphs Abs: 1.3 K/uL (ref 0.7–4.0)
MCH: 29.6 pg (ref 26.0–34.0)
MCHC: 33.4 g/dL (ref 30.0–36.0)
MCV: 88.6 fL (ref 80.0–100.0)
Monocytes Absolute: 0.4 K/uL (ref 0.1–1.0)
Monocytes Relative: 7 %
Neutro Abs: 4.4 K/uL (ref 1.7–7.7)
Neutrophils Relative %: 67 %
Platelets: 200 K/uL (ref 150–400)
RBC: 4.9 MIL/uL (ref 3.87–5.11)
RDW: 14.8 % (ref 11.5–15.5)
WBC: 6.5 K/uL (ref 4.0–10.5)
nRBC: 0 % (ref 0.0–0.2)

## 2024-01-28 LAB — IRON AND TIBC
Iron: 100 ug/dL (ref 28–170)
Saturation Ratios: 28 % (ref 10.4–31.8)
TIBC: 357 ug/dL (ref 250–450)
UIBC: 257 ug/dL

## 2024-01-28 LAB — FERRITIN: Ferritin: 105 ng/mL (ref 11–307)

## 2024-01-28 LAB — FOLATE: Folate: >20 ng/mL (ref >5.9)

## 2024-01-28 LAB — VITAMIN B12: Vitamin B-12: 178 pg/mL — ABNORMAL LOW (ref 180–914)

## 2024-02-02 LAB — METHYLMALONIC ACID, SERUM: Methylmalonic Acid, Quantitative: 800 nmol/L — ABNORMAL HIGH (ref 0–378)

## 2024-02-04 ENCOUNTER — Inpatient Hospital Stay

## 2024-02-04 ENCOUNTER — Inpatient Hospital Stay: Attending: Oncology | Admitting: Oncology

## 2024-02-04 VITALS — BP 126/77 | HR 58 | Temp 98.1°F | Resp 18 | Wt 191.0 lb

## 2024-02-04 DIAGNOSIS — D509 Iron deficiency anemia, unspecified: Secondary | ICD-10-CM

## 2024-02-04 DIAGNOSIS — Z8041 Family history of malignant neoplasm of ovary: Secondary | ICD-10-CM | POA: Diagnosis not present

## 2024-02-04 DIAGNOSIS — Z8719 Personal history of other diseases of the digestive system: Secondary | ICD-10-CM | POA: Insufficient documentation

## 2024-02-04 DIAGNOSIS — Z800 Family history of malignant neoplasm of digestive organs: Secondary | ICD-10-CM | POA: Insufficient documentation

## 2024-02-04 DIAGNOSIS — E538 Deficiency of other specified B group vitamins: Secondary | ICD-10-CM | POA: Diagnosis not present

## 2024-02-04 DIAGNOSIS — Z8049 Family history of malignant neoplasm of other genital organs: Secondary | ICD-10-CM | POA: Insufficient documentation

## 2024-02-04 MED ORDER — CYANOCOBALAMIN 1000 MCG/ML IJ SOLN: 1000.0000 ug | INTRAMUSCULAR | 0 refills | Status: AC

## 2024-02-04 MED ORDER — CYANOCOBALAMIN 1000 MCG/ML IJ SOLN: 1000.0000 ug | Freq: Once | INTRAMUSCULAR | 0 refills | Status: DC

## 2024-02-04 MED ORDER — CYANOCOBALAMIN 1000 MCG/ML IJ SOLN: 1000.0000 ug | Freq: Once | INTRAMUSCULAR | Status: DC

## 2024-02-04 MED ORDER — CYANOCOBALAMIN 1000 MCG/ML IJ SOLN
1000.0000 ug | Freq: Once | INTRAMUSCULAR | Status: AC
  Administered 2024-02-04: 1000 ug via INTRAMUSCULAR
  Filled 2024-02-04: qty 1

## 2024-02-04 NOTE — Assessment & Plan Note (Addendum)
-   Etiology suspected to be malabsorption in the setting of long-term PPI use - Extensive GI work-up in July 2022 (EGD, colonoscopy, capsule study) did not reveal any source of blood loss - She is unable to tolerate oral iron supplements due to side effects --Most recent iron was IV fereheme which was given last 08/13/23.  - No obvious bleeding such as bright red blood per rectum, melena, or epistaxis.  -No additional IV iron needed at this time. -Follow-up in 6 months with lab work.

## 2024-02-04 NOTE — Patient Instructions (Signed)
 [facility] [place] - A DEPT OF [facility]  Discharge Instructions: Thank you for choosing [facility] to provide your oncology and hematology care.  If you have a lab appointment with the Cancer Center - please note that after April 8th, 2024, all labs will be drawn in the cancer center.  You do not have to check in or register with the main entrance as you have in the past but will complete your check-in in the cancer center.  Wear comfortable clothing and clothing appropriate for easy access to any Portacath or PICC line.   We strive to give you quality time with your provider. You may need to reschedule your appointment if you arrive late (15 or more minutes).  Arriving late affects you and other patients whose appointments are after yours.  Also, if you miss three or more appointments without notifying the office, you may be dismissed from the clinic at the provider's discretion.      For prescription refill requests, have your pharmacy contact our office and allow 72 hours for refills to be completed.    Today you received the following B12 injection.   Vitamin B12 Injection What is this medication? Vitamin B12 (VAHY tuh min B12) prevents and treats low vitamin B12 levels in your body. It is used in people who do not get enough vitamin B12 from their diet or when their digestive tract does not absorb enough. Vitamin B12 plays an important role in maintaining the health of your nervous system and red blood cells. This medicine may be used for other purposes; ask your health care provider or pharmacist if you have questions. COMMON BRAND NAME(S): B-12 Compliance Kit, B-12 Injection Kit, Cyomin, Dodex, LA-12, Nutri-Twelve, Physicians EZ Use B-12, Primabalt, Vitamin Deficiency Injectable System - B12 What should I tell my care team before I take this medication? They need to know if you have any of these conditions: Kidney disease Leber's  disease Megaloblastic anemia An unusual or allergic reaction to cyanocobalamin, cobalt, other medications, foods, dyes, or preservatives Pregnant or trying to get pregnant Breast-feeding How should I use this medication? This medication is injected into a muscle or deeply under the skin. It is usually given in a clinic or care team's office. However, your care team may teach you how to inject yourself. Follow all instructions. Talk to your care team about the use of this medication in children. Special care may be needed. Overdosage: If you think you have taken too much of this medicine contact a poison control center or emergency room at once. NOTE: This medicine is only for you. Do not share this medicine with others. What if I miss a dose? If you are given your dose at a clinic or care team's office, call to reschedule your appointment. If you give your own injections, and you miss a dose, take it as soon as you can. If it is almost time for your next dose, take only that dose. Do not take double or extra doses. What may interact with this medication? Alcohol Colchicine This list may not describe all possible interactions. Give your health care provider a list of all the medicines, herbs, non-prescription drugs, or dietary supplements you use. Also tell them if you smoke, drink alcohol, or use illegal drugs. Some items may interact with your medicine. What should I watch for while using this medication? Visit your care team regularly. You may need blood work done while you are  taking this medication. You may need to follow a special diet. Talk to your care team. Limit your alcohol intake and avoid smoking to get the best benefit. What side effects may I notice from receiving this medication? Side effects that you should report to your care team as soon as possible: Allergic reactions--skin rash, itching, hives, swelling of the face, lips, tongue, or throat Swelling of the ankles, hands, or  feet Trouble breathing Side effects that usually do not require medical attention (report to your care team if they continue or are bothersome): Diarrhea This list may not describe all possible side effects. Call your doctor for medical advice about side effects. You may report side effects to FDA at 1-800-FDA-1088. Where should I keep my medication? Keep out of the reach of children. Store at room temperature between 15 and 30 degrees C (59 and 85 degrees F). Protect from light. Throw away any unused medication after the expiration date. NOTE: This sheet is a summary. It may not cover all possible information. If you have questions about this medicine, talk to your doctor, pharmacist, or health care provider.  2024 Elsevier/Gold Standard (2020-12-31 00:00:00)    To help prevent nausea and vomiting after your treatment, we encourage you to take your nausea medication as directed.  BELOW ARE SYMPTOMS THAT SHOULD BE REPORTED IMMEDIATELY: *FEVER GREATER THAN 100.4 F (38 C) OR HIGHER *CHILLS OR SWEATING *NAUSEA AND VOMITING THAT IS NOT CONTROLLED WITH YOUR NAUSEA MEDICATION *UNUSUAL SHORTNESS OF BREATH *UNUSUAL BRUISING OR BLEEDING *URINARY PROBLEMS (pain or burning when urinating, or frequent urination) *BOWEL PROBLEMS (unusual diarrhea, constipation, pain near the anus) TENDERNESS IN MOUTH AND THROAT WITH OR WITHOUT PRESENCE OF ULCERS (sore throat, sores in mouth, or a toothache) UNUSUAL RASH, SWELLING OR PAIN  UNUSUAL VAGINAL DISCHARGE OR ITCHING   Items with * indicate a potential emergency and should be followed up as soon as possible or go to the Emergency Department if any problems should occur.  Please show the CHEMOTHERAPY ALERT CARD or IMMUNOTHERAPY ALERT CARD at check-in to the Emergency Department and triage nurse.  Should you have questions after your visit or need to cancel or reschedule your appointment, please contact [facility] [place] - A DEPT OF [facility] [phone]  and follow the prompts.  Office hours are 8:00 a.m. to 4:30 p.m. Monday - Friday. Please note that voicemails left after 4:00 p.m. may not be returned until the following business day.  We are closed weekends and major holidays. You have access to a nurse at all times for urgent questions. Please call the main number to the clinic [phone] and follow the prompts.  For any non-urgent questions, you may also contact your provider using MyChart. We now offer e-Visits for anyone 23 and older to request care online for non-urgent symptoms. For details visit [email or web address].   Also download the MyChart app! Go to the app store, search "MyChart", open the app, select [facility], and log in with your MyChart username and password.

## 2024-02-04 NOTE — Assessment & Plan Note (Addendum)
-   Most recent labs from 01/28/24 showed b12 178 and MMA 800.  -- Recommend B12 injection today.  Will send prescription for monthly B12 moving forward.

## 2024-02-04 NOTE — Progress Notes (Signed)
 [facility] OFFICE PROGRESS NOTE  Mary Jerel MATSU, MD  ASSESSMENT & PLAN:    Assessment & Plan Iron deficiency anemia, unspecified iron deficiency anemia type - Etiology suspected to be malabsorption in the setting of long-term PPI use - Extensive GI work-up in July 2022 (EGD, colonoscopy, capsule study) did not reveal any source of blood loss - She is unable to tolerate oral iron supplements due to side effects --Most recent iron was IV fereheme which was given last 08/13/23.  - No obvious bleeding such as bright red blood per rectum, melena, or epistaxis.  -No additional IV iron needed at this time. -Follow-up in 6 months with lab work. Vitamin B12 deficiency disease - Most recent labs from 01/28/24 showed b12 178 and MMA 800.  -- Recommend B12 injection today.  Will send prescription for monthly B12 moving forward. Folic acid  deficiency  Folic acid  deficiency noted in September 2023 with folate 2.8 - Has been taking the 400 mcg folic acid  supplement daily since September 2023 and was decreased to every other day in March 2024. -Most recent folate level is greater than 20. -Discussed stopping folic acid  at this time. -Will recheck at her next visit. Family history of cancer of GI tract Patient reports family history of several different types of GI cancers including pancreatic and colon and her mother had ovarian and endometrial cancer.  She reports several bouts of pancreatitis over the past year or so and she is a nondrinker.  She is wondering if she needs to do any genetic screening versus screening for GI cancers given history. We discussed genetic screening and we will go from there. Most recent CT scan from 08/27/2022 shows no acute abnormality in abdomen and/or pelvis. She is up-to-date on colonoscopy and mammogram.  Orders Placed This Encounter  Procedures   Ferritin    Standing Status:   Future    Expected Date:   08/01/2024    Expiration Date:   02/03/2025    Iron and TIBC ([facility] ONLY)    Standing Status:   Future    Expected Date:   08/01/2024    Expiration Date:   02/03/2025   Vitamin B12    Standing Status:   Future    Expected Date:   08/01/2024    Expiration Date:   02/03/2025   Folate    Standing Status:   Future    Expected Date:   08/01/2024    Expiration Date:   02/03/2025   CBC with Differential    Standing Status:   Future    Expected Date:   08/01/2024    Expiration Date:   02/03/2025   Comprehensive metabolic panel    Standing Status:   Future    Expected Date:   08/01/2024    Expiration Date:   02/03/2025   Methylmalonic acid, serum    Standing Status:   Future    Expected Date:   08/01/2024    Expiration Date:   02/03/2025   Ambulatory referral to Genetics    Referral Priority:   Routine    Referral Type:   Consultation    Referral Reason:   Specialty Services Required    Number of Visits Requested:   1    INTERVAL HISTORY: Patient returns for follow-up.  Mary Strong 63 y.o. female returns for routine follow-up of iron deficiency anemia, suspected to be secondary to malabsorption.     She receives intermittent IV iron last given on 08/13/2023.  She is unable to tolerate oral iron.  She denies any active bleeding.   Since her last visit, she denies any hospitalizations, surgeries or changes in her baseline health.   Reports chronic stable fatigue.  Reports her mom has been sick and she has been going 2 days a week to [place] to be with her all day which has taken an emotional and physical toll on her.  Reports new onset of dizziness while sitting and not associated with standing or moving her head.  Reports it happens several times per week.  She has not mentioned it to her PCP yet.  Appetite of 100% energy levels are 25%. She is currently on folic acid  every other day and vitamin D  supplements 50,000 units weekly.  Reports she used to give herself monthly B12 shots but she has not in quite some time.     Reports history of pancreatitis x 3 a few years ago.  She has family history of several GI cancers including pancreatic cancer and colon cancer.  Orts her mom died from endometrial or ovarian cancer.  She has never had genetic screening but is interested.  She is a nondrinker and non-smoker.  We reviewed iron levels, B12, folate, CBC, CMP and MMA.  SUMMARY OF HEMATOLOGIC HISTORY: Oncology History   No history exists.     CBC    Component Value Date/Time   WBC 6.5 01/28/2024 1034   RBC 4.90 01/28/2024 1034   HGB 14.5 01/28/2024 1034   HGB 11.9 09/18/2015 1155   HCT 43.4 01/28/2024 1034   HCT 34.5 09/18/2015 1155   PLT 200 01/28/2024 1034   PLT 215 09/18/2015 1155   MCV 88.6 01/28/2024 1034   MCV 84 09/18/2015 1155   MCH 29.6 01/28/2024 1034   MCHC 33.4 01/28/2024 1034   RDW 14.8 01/28/2024 1034   RDW 16.8 (H) 09/18/2015 1155   LYMPHSABS 1.3 01/28/2024 1034   LYMPHSABS 2.4 09/18/2015 1155   MONOABS 0.4 01/28/2024 1034   EOSABS 0.3 01/28/2024 1034   EOSABS 0.6 (H) 09/18/2015 1155   BASOSABS 0.0 01/28/2024 1034   BASOSABS 0.0 09/18/2015 1155       Latest Ref Rng & Units 01/28/2024   10:34 AM 07/23/2023   11:35 AM 01/15/2023    2:22 PM  CMP  Glucose 70 - 99 mg/dL 853  885  791   BUN 8 - 23 mg/dL 13  10  10    Creatinine 0.44 - 1.00 mg/dL 8.92  9.07  9.06   Sodium 135 - 145 mmol/L 142  140  136   Potassium 3.5 - 5.1 mmol/L 4.8  4.4  4.0   Chloride 98 - 111 mmol/L 105  106  102   CO2 22 - 32 mmol/L 27  23  23    Calcium  8.9 - 10.3 mg/dL 9.2  9.8  8.7   Total Protein 6.5 - 8.1 g/dL 7.2  7.6  7.1   Total Bilirubin 0.0 - 1.2 mg/dL 0.7  0.7  0.4   Alkaline Phos 38 - 126 U/L 62  50  52   AST 15 - 41 U/L 25  39  31   ALT 0 - 44 U/L 35  32  27      Lab Results  Component Value Date   FERRITIN 105 01/28/2024   VITAMINB12 178 (L) 01/28/2024    Vitals:   02/04/24 1151  BP: 126/77  Pulse: (!) 58  Resp: 18  Temp: 98.1 F (36.7 C)  SpO2:  100%    Review of  System:  Review of Systems  Constitutional:  Positive for malaise/fatigue.  Respiratory:  Positive for shortness of breath.   Cardiovascular:  Positive for chest pain and palpitations.  Neurological:  Positive for dizziness, sensory change, weakness and headaches.  Psychiatric/Behavioral:  The patient is nervous/anxious.     Physical Exam: Physical Exam Constitutional:      Appearance: Normal appearance. She is obese.  HENT:     Head: Normocephalic and atraumatic.  Eyes:     Pupils: Pupils are equal, round, and reactive to light.  Cardiovascular:     Rate and Rhythm: Normal rate and regular rhythm.     Heart sounds: Normal heart sounds. No murmur heard. Pulmonary:     Effort: Pulmonary effort is normal.     Breath sounds: Normal breath sounds. No wheezing.  Abdominal:     General: Bowel sounds are normal. There is no distension.     Palpations: Abdomen is soft.     Tenderness: There is no abdominal tenderness.  Musculoskeletal:        General: Normal range of motion.     Cervical back: Normal range of motion.  Skin:    General: Skin is warm and dry.     Findings: No rash.  Neurological:     Mental Status: She is alert and oriented to person, place, and time.     Gait: Gait is intact.  Psychiatric:        Mood and Affect: Mood and affect normal.        Cognition and Memory: Memory normal.        Judgment: Judgment normal.      I spent 25 minutes dedicated to the care of this patient (face-to-face and non-face-to-face) on the date of the encounter to include what is described in the assessment and plan.,  Delon Hope, NP 02/04/2024 3:20 PM

## 2024-02-04 NOTE — Assessment & Plan Note (Addendum)
 Folic acid  deficiency noted in September 2023 with folate 2.8 - Has been taking the 400 mcg folic acid  supplement daily since September 2023 and was decreased to every other day in March 2024. -Most recent folate level is greater than 20. -Discussed stopping folic acid  at this time. -Will recheck at her next visit.

## 2024-02-04 NOTE — Progress Notes (Signed)
 Patient to get B12 injection after office visit today per Delon Hope PA.   Patient tolerated injection in left arm with no complaints voiced.  Site clean and dry with no bruising or swelling noted.  No complaints of pain.  Discharged with vital signs stable and no signs or symptoms of distress noted.

## 2024-02-09 NOTE — Progress Notes (Unsigned)
 REFERRING PROVIDER: Geofm Delon BRAVO, NP [address] [place],  [place] [address]  PRIMARY PROVIDER:  Toribio Jerel MATSU, MD  PRIMARY REASON FOR VISIT:  1. Family history of pancreatic cancer   2. Family history of colon cancer   3. Family history of ovarian cancer    I connected with Mary Strong on 02/10/2024 at 9:00 AM EDT by telephone and verified that I am speaking with the correct person using three identifiers.    Patient location: home*** Provider location: [facility]    HISTORY OF PRESENT ILLNESS:   Mary Strong, a 63 y.o. female, was seen for a [place] cancer genetics consultation at the request of Delon Geofm NP due to a family history of cancer.  Mary Strong presents to clinic today to discuss the possibility of a hereditary predisposition to cancer, genetic testing, and to further clarify her future cancer risks, as well as potential cancer risks for family members.   CANCER HISTORY:  Mary Strong is a 63 y.o. female with no personal history of cancer.    RELEVANT MEDICAL HISTORY:  Menarche was at age ***.  First live birth at age ***.  OCP use for approximately {Numbers 1-12 multi-select:20307} years.  Ovaries intact: {Yes/No-Ex:120004}.  Hysterectomy: {Yes/No-Ex:120004}.  Menopausal status: postmenopausal.  HRT use: {Numbers 1-12 multi-select:20307} years. Colonoscopy: yes; {normal/abnormal/not examined:14677}. Mammogram within the last year: {Yes/No-Ex:120004}. Number of breast biopsies: {Numbers 1-12 multi-select:20307}.   Past Medical History:  Diagnosis Date   Anxiety    Arthritis    CAD (coronary artery disease)    Heavily calcified vessels, DES to CTO RCA and DES to mid LAD September 2020   Constipation    Depression    Essential hypertension    Fatty liver    GERD (gastroesophageal reflux disease)    History of migraine    History of sleep apnea    Currently not using CPAP   Hypothyroidism    Multiple sclerosis    Neurogenic bladder     Neuropathy    Palpitations    Restless leg syndrome    Skin cancer    Basal cell cancer on forehead, strong family hx of adenocarcinoma   Type 2 diabetes mellitus (HCC)     Past Surgical History:  Procedure Laterality Date   BIOPSY  11/05/2020   Procedure: BIOPSY;  Surgeon: Eartha Angelia Toribio, MD;  [facility];  Service: Gastroenterology;;   BIOPSY  09/18/2022   Procedure: BIOPSY;  Surgeon: Eartha Angelia Toribio, MD;  [facility];  Service: Gastroenterology;;   CESAREAN SECTION     x 2   CHOLECYSTECTOMY     COLONOSCOPY WITH PROPOFOL  N/A 11/05/2020   Procedure: COLONOSCOPY WITH PROPOFOL ;  Surgeon: Eartha Angelia Toribio, MD;  [facility];  Service: Gastroenterology;  Laterality: N/A;  12:30   CORONARY ATHERECTOMY N/A 01/18/2019   Procedure: CORONARY ATHERECTOMY;  Surgeon: Swaziland, Peter M, MD;  Location: [facility] INVASIVE CV LAB;  Service: Cardiovascular;  Laterality: N/A;   CORONARY CTO INTERVENTION N/A 01/18/2019   Procedure: CORONARY CTO INTERVENTION;  Surgeon: Swaziland, Peter M, MD;  Location: [facility] INVASIVE CV LAB;  Service: Cardiovascular;  Laterality: N/A;   CORONARY STENT INTERVENTION N/A 01/18/2019   Procedure: CORONARY STENT INTERVENTION;  Surgeon: Swaziland, Peter M, MD;  Location: [facility] INVASIVE CV LAB;  Service: Cardiovascular;  Laterality: N/A;   ENDOMETRIAL ABLATION     ESOPHAGOGASTRODUODENOSCOPY (EGD) WITH PROPOFOL  N/A 11/05/2020   Procedure: ESOPHAGOGASTRODUODENOSCOPY (EGD) WITH PROPOFOL ;  Surgeon: Eartha  Angelia Sieving, MD;  [facility];  Service: Gastroenterology;  Laterality: N/A;   ESOPHAGOGASTRODUODENOSCOPY (EGD) WITH PROPOFOL  N/A 09/18/2022   Procedure: ESOPHAGOGASTRODUODENOSCOPY (EGD) WITH PROPOFOL ;  Surgeon: Eartha Angelia Sieving, MD;  [facility];  Service: Gastroenterology;  Laterality: N/A;  10:30am;ASA 2   GIVENS CAPSULE STUDY N/A 11/18/2020   Procedure: GIVENS CAPSULE STUDY;  Surgeon: Eartha Angelia Sieving, MD;  [facility];  Service: Gastroenterology;  Laterality: N/A;  7:30   JOINT REPLACEMENT     LEFT HEART CATH AND CORONARY ANGIOGRAPHY N/A 01/10/2019   Procedure: LEFT HEART CATH AND CORONARY ANGIOGRAPHY;  Surgeon: Claudene Victory ORN, MD;  Location: [facility] INVASIVE CV LAB;  Service: Cardiovascular;  Laterality: N/A;   POLYPECTOMY  11/05/2020   Procedure: POLYPECTOMY;  Surgeon: Eartha Angelia Sieving, MD;  [facility];  Service: Gastroenterology;;   POLYPECTOMY  09/18/2022   Procedure: POLYPECTOMY;  Surgeon: Eartha Angelia Sieving, MD;  [facility];  Service: Gastroenterology;;   TONSILLECTOMY     TOTAL HIP ARTHROPLASTY Right 12/04/2015   TOTAL HIP ARTHROPLASTY Right 12/04/2015   Procedure: TOTAL HIP ARTHROPLASTY ANTERIOR APPROACH;  Surgeon: Oneil JAYSON Herald, MD;  Location: [facility] OR;  Service: Orthopedics;  Laterality: Right;   TOTAL HIP ARTHROPLASTY Left 01/24/2016   Procedure: TOTAL HIP ARTHROPLASTY ANTERIOR APPROACH;  Surgeon: Oneil JAYSON Herald, MD;  Location: [facility] OR;  Service: Orthopedics;  Laterality: Left;  anterior approach   TUBAL LIGATION      FAMILY HISTORY:  We obtained a detailed, 4-generation family history.  Significant diagnoses are listed below: Family History  Problem Relation Age of Onset   Multiple sclerosis Mother    Arthritis/Rheumatoid Mother    Psoriasis Father    Atrial fibrillation Father    Hypertension Brother     Mary Strong is unaware of previous family history of genetic testing for hereditary cancer risks. There is no reported Ashkenazi Jewish ancestry. There is no known consanguinity.  GENETIC COUNSELING ASSESSMENT: Mary Strong is a 63 y.o. female with a family history of pancreatic, colon and ovarian cancer ***  which is somewhat suggestive of a hereditary cancer syndrome and predisposition to cancer. We, therefore, discussed and recommended the following at today's visit.   DISCUSSION: We discussed that, in general, most cancer is  not inherited in families, but instead is sporadic or familial. Sporadic cancers occur by chance and typically happen at older ages (>50 years) . Some families have more cancers than would be expected by chance; however, the ages or types of cancer are not consistent with a known genetic mutation or known genetic mutations have been ruled out. This type of familial cancer is thought to be due to a combination of multiple genetic, environmental, hormonal, and lifestyle factors. While this combination of factors likely increases the risk of cancer, the exact source of this risk is not currently identifiable or testable.    We discussed that approximately 10% of cancer is hereditary. Most cases of hereditary colon/pancreatic cancer are associated with Lynch syndrome genes, although there are other genes associated with hereditary  cancer as well. Cancers and risks are gene specific. We discussed that testing is beneficial for several reasons including knowing about cancer risks, identifying potential screening and risk-reduction options that may be appropriate, and to understand if other family members could be at risk for cancer and allow them to undergo genetic testing.   We reviewed the characteristics, features and inheritance patterns of hereditary cancer syndromes. We also  discussed genetic testing, including the appropriate family members to test, the process of testing, insurance coverage and turn-around-time for results. We discussed the implications of a negative, positive and/or variant of uncertain significant result. We recommended Mary Strong pursue genetic testing for the [facility] CancerNext-Expanded+RNA gene panel.   The CancerNext-Expanded gene panel offered by [facility] and includes sequencing, rearrangement, and RNA analysis for the following 77 genes: AIP, ALK, APC, ATM, AXIN2, BAP1, BARD1, BMPR1A, BRCA1, BRCA2, BRIP1, CDC73, CDH1, CDK4, CDKN1B, CDKN2A, CEBPA, CHEK2, CTNNA1, DDX41, DICER1,  ETV6, FH, FLCN, GATA2, LZTR1, MAX, MBD4, MEN1, MET, MLH1, MSH2, MSH3, MSH6, MUTYH, NF1, NF2, NTHL1, PALB2, PHOX2B, PMS2, POT1, PRKAR1A, PTCH1, PTEN, RAD51C, RAD51D, RB1, RET, RPS20, RUNX1, SDHA, SDHAF2, SDHB, SDHC, SDHD, SMAD4, SMARCA4, SMARCB1, SMARCE1, STK11, SUFU, TMEM127, TP53, TSC1, TSC2, VHL, and WT1 (sequencing and deletion/duplication); EGFR, HOXB13, KIT, MITF, PDGFRA, POLD1, and POLE (sequencing only); EPCAM and GREM1 (deletion/duplication only).   Based on Mary Strong's family history of cancer, she meets medical criteria for genetic testing. Though Mary Strong is not personally affected, there are no affected family members that are willing/able to undergo hereditary cancer testing.  Therefore, Mary Strong the most informative family member available. Despite that she meets criteria, she may still have an out of pocket cost.   We discussed that some people do not want to undergo genetic testing due to fear of genetic discrimination.  A federal law called the Genetic Information Non-Discrimination Act (GINA) of 2008 helps protect individuals against genetic discrimination based on their genetic test results.  It impacts both health insurance and employment.  For health insurance, it protects against increased premiums, being kicked off insurance or being forced to take a test in order to be insured.  For employment it protects against hiring, firing and promoting decisions based on genetic test results.  Health status due to a cancer diagnosis is not protected under GINA.  This law does not protect life insurance, disability insurance, or other types of insurance.   PLAN: After considering the risks, benefits, and limitations, Mary Strong provided informed consent to pursue genetic testing. She Strong have her blood drawn on *** and the sample Strong be sent to [facility] for analysis of the CancerNext-Expanded+RNA panel. Results should be available within approximately 2-3 weeks' time, at which point  they Strong be disclosed by telephone to Mary Strong, as Strong any additional recommendations warranted by these results. Mary Strong Strong receive a summary of her genetic counseling visit and a copy of her results once available. This information Strong also be available in Epic.   *** Despite our recommendation, Mary Strong did not wish to pursue genetic testing at today's visit. We understand this decision and remain available to coordinate genetic testing at any time in the future. We, therefore, recommend Mary Strong continue to follow the cancer screening guidelines given by her primary healthcare provider.  ***Based on Mary Strong's family history, we recommended her *** have genetic counseling and testing. Mary Strong Strong let us  know if we can be of any assistance in coordinating genetic counseling and/or testing for this family member.   Mary Strong. Adorno questions were answered to her satisfaction today. Our contact information was provided should additional questions or concerns arise. Thank you for the referral and allowing us  to share in the care of your patient.   Dena Cary, Mary Strong, [facility] Genetic Counselor [email or web address] Phone: [phone]  I personally spent a total of *** minutes in the care of the patient today including {Time  Based Coding:210964241}.   _______________________________________________________________________ For Office Staff:  Number of people involved in session: *** Was an Intern/ student involved with case: {YES/NO:63}

## 2024-02-10 ENCOUNTER — Other Ambulatory Visit: Payer: Self-pay | Admitting: Licensed Clinical Social Worker

## 2024-02-10 ENCOUNTER — Inpatient Hospital Stay: Admitting: Licensed Clinical Social Worker

## 2024-02-10 ENCOUNTER — Encounter: Payer: Self-pay | Admitting: Licensed Clinical Social Worker

## 2024-02-10 DIAGNOSIS — E538 Deficiency of other specified B group vitamins: Secondary | ICD-10-CM | POA: Diagnosis not present

## 2024-02-10 DIAGNOSIS — Z1379 Encounter for other screening for genetic and chromosomal anomalies: Secondary | ICD-10-CM

## 2024-02-10 DIAGNOSIS — Z8041 Family history of malignant neoplasm of ovary: Secondary | ICD-10-CM

## 2024-02-10 DIAGNOSIS — Z800 Family history of malignant neoplasm of digestive organs: Secondary | ICD-10-CM

## 2024-02-10 DIAGNOSIS — Z8049 Family history of malignant neoplasm of other genital organs: Secondary | ICD-10-CM

## 2024-02-16 ENCOUNTER — Encounter (INDEPENDENT_AMBULATORY_CARE_PROVIDER_SITE_OTHER): Payer: Self-pay | Admitting: Gastroenterology

## 2024-03-01 ENCOUNTER — Inpatient Hospital Stay

## 2024-03-16 ENCOUNTER — Inpatient Hospital Stay: Attending: Oncology

## 2024-03-16 DIAGNOSIS — Z1379 Encounter for other screening for genetic and chromosomal anomalies: Secondary | ICD-10-CM

## 2024-03-16 LAB — GENETIC SCREENING ORDER

## 2024-04-05 ENCOUNTER — Encounter: Payer: Self-pay | Admitting: Licensed Clinical Social Worker

## 2024-04-05 ENCOUNTER — Ambulatory Visit: Payer: Self-pay | Admitting: Licensed Clinical Social Worker

## 2024-04-05 ENCOUNTER — Telehealth: Payer: Self-pay | Admitting: Licensed Clinical Social Worker

## 2024-04-05 DIAGNOSIS — Z1379 Encounter for other screening for genetic and chromosomal anomalies: Secondary | ICD-10-CM | POA: Insufficient documentation

## 2024-04-05 NOTE — Telephone Encounter (Signed)
 I contacted Mary Strong to discuss her genetic testing results. 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 Remainder of testing was normal/negative. Detailed clinic note to follow.   The test report has been scanned into EPIC and is located under the Molecular Pathology section of the Results Review tab.  A portion of the result report is included below for reference.      Dena Cary, MS, [facility] Genetic Counselor [email or web address] Phone: [phone]

## 2024-04-05 NOTE — Progress Notes (Signed)
 HPI:   Ms. Pollok was previously seen in the [place] [facility] [facility] clinic due to a family history of cancer and concerns regarding a hereditary predisposition to cancer. Please refer to our prior cancer genetics clinic note for more information regarding our discussion, assessment and recommendations, at the time. Ms. Cueva recent genetic test results were disclosed to her, as were recommendations warranted by these results. These results and recommendations are discussed in more detail below.  CANCER HISTORY:  Oncology History   No history exists.    FAMILY HISTORY:  We obtained a detailed, 4-generation family history.  Significant diagnoses are listed below: Family History  Problem Relation Age of Onset   Multiple sclerosis Mother    Arthritis/Rheumatoid Mother    Uterine cancer Mother        dx 64s   Ovarian cancer Mother        dx 4s   Skin cancer Mother    Psoriasis Father    Atrial fibrillation Father    Pancreatitis Father    Hypertension Brother    Stomach cancer Maternal Aunt        60s-70s   Pancreatic cancer Maternal Grandmother    Colon cancer Other     Ms. Podgorski has 1 son, 16 and 1 daughter, 68, no cancers. She has 1 brother, 66, no cancers.   Ms. Paris mother had uterine and ovarian cancer in her 30s and has had multiple skin cancers. Patient's maternal aunt died of stomach cancer in her 23s-70s. Maternal grandmother died of pancreatic cancer at 40 and 3 of her sisters had colon/GI cancers, as well as several of patient's 1st cousins once removed.    Ms. Lamboy father is living at 93, no known cancers on this side of the family.   Ms. Mantell is unaware of previous family history of genetic testing for hereditary cancer risks. There is no reported Ashkenazi Jewish ancestry. There is no known consanguinity.     GENETIC TEST RESULTS:  The [facility] CancerNext-Expanded+RNA + Pancreatitis Panel found a single pathogenic variant in CFTR called p.F508del,  meaning she is a carrier of Cystic Fibrosis but does not have this condition. The remainder of testing was normal.   The CancerNext-Expanded gene panel offered by [facility] and includes sequencing, rearrangement, and RNA analysis for the following 82 genes: AIP, ALK, APC, ATM, AXIN2, BAP1, BARD1, BMPR1A, BRCA1, BRCA2, BRIP1, CDC73, CDH1, CDK4, CDKN1B, CDKN2A, CEBPA, CHEK2, CTNNA1, DDX41, DICER1, ETV6, FH, FLCN, GATA2, LZTR1, MAX, MBD4, MEN1, MET, MLH1, MSH2, MSH3, MSH6, MUTYH, NF1, NF2, NTHL1, PALB2, PHOX2B, PMS2, POT1, PRKAR1A, PTCH1, PTEN, RAD51C, RAD51D, RB1, RET, RPS20, RUNX1, SDHA, SDHAF2, SDHB, SDHC, SDHD, SMAD4, SMARCA4, SMARCB1, SMARCE1, STK11, SUFU, TMEM127, TP53, TSC1, TSC2, VHL, and WT1 (sequencing and deletion/duplication); EGFR, HOXB13, KIT, MITF, PDGFRA, POLD1, and POLE (sequencing only); EPCAM and GREM1 (deletion/duplication only). CFTR, CPA1, CTRC, PRSS1 and SPINK1.   The test report has been scanned into EPIC and is located under the Molecular Pathology section of the Results Review tab.  A portion of the result report is included below for reference. Genetic testing reported out on 04/03/2024.       This result does not explain the family history of cancer, but could explain Ms. Dwight's history of pancreatitis.   Even though a pathogenic variant was not identified, possible explanations for the cancer in the family may include: There may be no hereditary risk for cancer in the family. The cancers in Ms. Gosse and/or her family may be sporadic/familial  or due to other genetic and environmental factors. There may be a gene mutation in one of these genes that current testing methods cannot detect but that chance is small. There could be another gene that has not yet been discovered, or that we have not yet tested, that is responsible for the cancer diagnoses in the family.  It is also possible there is a hereditary cause for the cancer in the family that Ms. Feggins did not  inherit.  Therefore, it is important to remain in touch with cancer genetics in the future so that we can continue to offer Ms. Minotti the most up to date genetic testing.   CFTR (Cystic Fibrosis Carrier):  Having two mutations in the CFTR gene can result in cystic fibrosis, an autosomal recessive condition that classically affects the lungs, pancreas, and digestive tract. In addition to classic cystic fibrosis, there are variant types of cystic fibrosis, called CFTR-related disorders (CFTR-RD), which have less severe symptoms including milder respiratory problems, female infertility, and/or acute recurrent or chronic pancreatitis. In some males, infertility caused by congenital bilateral absence of the vas deferens (CBAVD) may be the only clinical finding. Due to the autosomal recessive inheritance, two pathogenic variants (one on each chromosome) are required to cause disease.  Because Ms. Heinzelman only has one pathogenic variant, she is not affected with a CFTR-related disorder, but is instead a carrier.  Cancer risks associated with being a CFTR carrier are not significantly increased compared to the general population. Individuals with only one pathogenic or likely pathogenic CFTR variant may have a slightly increased risk to develop pancreatitis, bronchiectasis (a chronic lung condition), or have female infertility. However, most CFTR carriers will not have any of these medical concerns.  Family members may be at risk- they can be tested for the pathogenic or likely pathogenic CFTR variant that was identified as well as other CFTR variants.  ADDITIONAL GENETIC TESTING:  We discussed with Ms. Inabinet that her genetic testing was fairly extensive.  If there are additional relevant genes identified to increase cancer risk that can be analyzed in the future, we would be happy to discuss and coordinate this testing at that time.    CANCER SCREENING RECOMMENDATIONS:  We have not identified a hereditary cause  for her family history of cancer at this time.   An individual's cancer risk and medical management are not determined by genetic test results alone. Overall cancer risk assessment incorporates additional factors, including personal medical history, family history, and any available genetic information that may result in a personalized plan for cancer prevention and surveillance. Therefore, it is recommended she continue to follow the cancer management and screening guidelines provided by her primary healthcare provider.  RECOMMENDATIONS FOR FAMILY MEMBERS:   Since she did not inherit a identifiable mutation in a cancer predisposition gene included on this panel, her children could not have inherited a known mutation from her in one of these genes.However, as mentioned above, they could have inherited the same CFTR mutation that Ms. Lohnes has and may want to have testing for this and other CFTR mutations. Individuals in this family might be at some increased risk of developing cancer, over the general population risk, due to the family history of cancer.  Individuals in the family should notify their providers of the family history of cancer. We recommend women in this family have a yearly mammogram beginning at age 8, or 44 years younger than the earliest onset of cancer, an annual clinical breast exam,  and perform monthly breast self-exams.  Family members should have colonoscopies by at age 64, or earlier, as recommended by their providers. Other members of the family may still carry a pathogenic variant in one of these genes that Ms. Goette did not inherit. Based on the family history, we recommend her maternal relatives have genetic counseling and testing. Ms. Lacosse will let us  know if we can be of any assistance in coordinating genetic counseling and/or testing for this family member.    FOLLOW-UP:  Lastly, we discussed with Ms. Obarr that cancer genetics is a rapidly advancing field and it is  possible that new genetic tests will be appropriate for her and/or her family members in the future. We encouraged her to remain in contact with cancer genetics on an annual basis so we can update her personal and family histories and let her know of advances in cancer genetics that may benefit this family.   Our contact number was provided. Ms. Moilanen questions were answered to her satisfaction, and she knows she is welcome to call us  at anytime with additional questions or concerns.    Dena Cary, MS, [facility] Genetic Counselor [email or web address] Phone: [phone]

## 2024-05-01 ENCOUNTER — Encounter: Payer: Self-pay | Admitting: *Deleted

## 2024-05-09 ENCOUNTER — Other Ambulatory Visit: Payer: Self-pay | Admitting: Cardiology

## 2024-05-16 ENCOUNTER — Encounter: Payer: Self-pay | Admitting: Adult Health

## 2024-05-16 ENCOUNTER — Ambulatory Visit: Admitting: Adult Health

## 2024-05-16 VITALS — BP 119/80 | HR 74 | Ht 62.0 in | Wt 199.0 lb

## 2024-05-16 DIAGNOSIS — L900 Lichen sclerosus et atrophicus: Secondary | ICD-10-CM | POA: Diagnosis not present

## 2024-05-16 DIAGNOSIS — N9089 Other specified noninflammatory disorders of vulva and perineum: Secondary | ICD-10-CM | POA: Insufficient documentation

## 2024-05-16 NOTE — Progress Notes (Signed)
" °  Subjective:     Patient ID: Mary Strong, female   DOB: [birth year], 64 y.o.   MRN: [id number]  HPI Mary Strong is a 64 year old white female, married, PM in for mass near vagina, that she noticed about a month ago, and was seen by Mary Somerset PA 05/10/24, when had some burning in that area and was prescribed triamcinolone for lichen planus.  Last pap was 2024 with HPV at [facility].  PCP is Dr Mary Strong   Review of Systems Mass near vaginal    No burning now Reviewed past medical,surgical, social and family history. Reviewed medications and allergies.  Objective:   Physical Exam BP 119/80 (BP Location: Right Arm, Patient Position: Sitting, Cuff Size: Large)   Pulse 74   Ht 5' 2 (1.575 m)   Wt 199 lb (90.3 kg)   BMI 36.40 kg/m     Skin warm and dry.Pelvic: external genitalia there is a white thickened tissue, just below introitus with about 1 cm firm mass, with som excoriation,  vagina: pale,urethra has no lesions or masses noted, cervix:smooth uterus: normal size, shape and contour, non tender, no masses felt, adnexa: no masses or tenderness noted. Bladder is non tender and no masses felt.  Let her see with mirror. AA 0 Fall risk is high    05/16/2024   11:33 AM 02/04/2024   11:46 AM 10/24/2020    2:40 PM  Depression screen PHQ 2/9  Decreased Interest 0 0 0  Down, Depressed, Hopeless 1 0 0  PHQ - 2 Score 1 0 0  Altered sleeping 1    Tired, decreased energy 1    Change in appetite 0    Feeling bad or failure about yourself  0    Trouble concentrating 0    Moving slowly or fidgety/restless 0    Suicidal thoughts 0    PHQ-9 Score 3         05/16/2024   11:34 AM  GAD 7 : Generalized Anxiety Score  Nervous, Anxious, on Edge 1  Control/stop worrying 1  Worry too much - different things 1  Trouble relaxing 1  Restless 0  Easily annoyed or irritable 0  Afraid - awful might happen 0  Total GAD 7 Score 4      Upstream - 05/16/24 1130       Pregnancy Intention Screening    Does the patient want to become pregnant in the next year? N/A    Does the patient's partner want to become pregnant in the next year? N/A    Would the patient like to discuss contraceptive options today? N/A      Contraception Wrap Up   Current Method Female Sterilization    End Method Female Sterilization    Contraception Counseling Provided No         Examination chaperoned by Clarita Salt LPN  Assessment:     1. Lichen sclerosus (Primary) Has thickened white skin just below introitus  Triamcinolone has helped  2. Perineal mass in female 1 cm firm mass just below introitus Will get biopsy 05/22/24 with Dr Mary Strong      Plan:     Return in 05/22/24 to see Dr Mary Strong for punch biopsy     "

## 2024-05-22 ENCOUNTER — Ambulatory Visit: Admitting: Obstetrics & Gynecology

## 2024-05-22 ENCOUNTER — Encounter: Payer: Self-pay | Admitting: Obstetrics & Gynecology

## 2024-05-22 ENCOUNTER — Other Ambulatory Visit (HOSPITAL_COMMUNITY)
Admission: RE | Admit: 2024-05-22 | Discharge: 2024-05-22 | Disposition: A | Discharge status: Home / Self Care | Source: Ambulatory Visit | Attending: Obstetrics & Gynecology | Admitting: Obstetrics & Gynecology

## 2024-05-22 VITALS — BP 113/68 | HR 77 | Ht 62.0 in | Wt 198.0 lb

## 2024-05-22 DIAGNOSIS — D071 Carcinoma in situ of vulva: Secondary | ICD-10-CM | POA: Diagnosis not present

## 2024-05-22 DIAGNOSIS — N904 Leukoplakia of vulva: Secondary | ICD-10-CM | POA: Diagnosis present

## 2024-05-22 NOTE — Addendum Note (Signed)
 Addended by: ILEAN RUTHERFORD HERO on: 05/22/2024 12:19 PM   Modules accepted: Orders

## 2024-05-22 NOTE — Progress Notes (Signed)
 Patient ID: Mary Strong, female   DOB: Jun 01, 1960, 64 y.o.   MRN: [id number]  Chief Complaint  Patient presents with   vulvar biopsy    HPI Mary Strong is a 64 y.o. female.   HPI Indication: white lesion of the vulva Symptoms:   burning and pruritic 2 weeks of symptoms  Location:  left perineal body, perianal  Past Medical History:  Diagnosis Date   Anxiety    Arthritis    CAD (coronary artery disease)    Heavily calcified vessels, DES to CTO RCA and DES to mid LAD September 2020   Constipation    Depression    Essential hypertension    Fatty liver    GERD (gastroesophageal reflux disease)    History of migraine    History of sleep apnea    Currently not using CPAP   Hypothyroidism    Multiple sclerosis    Neurogenic bladder    Neuropathy    Palpitations    Restless leg syndrome    Skin cancer    Basal cell cancer on forehead, strong family hx of adenocarcinoma   Type 2 diabetes mellitus (HCC)     Past Surgical History:  Procedure Laterality Date   BIOPSY  11/05/2020   Procedure: BIOPSY;  Surgeon: Eartha Angelia Sieving, MD;  Location: [facility];  Service: Gastroenterology;;   BIOPSY  09/18/2022   Procedure: BIOPSY;  Surgeon: Eartha Angelia Sieving, MD;  Location: [facility];  Service: Gastroenterology;;   CESAREAN SECTION     x 2   CHOLECYSTECTOMY     COLONOSCOPY WITH PROPOFOL  N/A 11/05/2020   Procedure: COLONOSCOPY WITH PROPOFOL ;  Surgeon: Eartha Angelia Sieving, MD;  Location: [facility];  Service: Gastroenterology;  Laterality: N/A;  12:30   CORONARY ATHERECTOMY N/A 01/18/2019   Procedure: CORONARY ATHERECTOMY;  Surgeon: Jordan, Peter M, MD;  Location: [facility] INVAS[facility] CV LAB;  Service: Cardiovascular;  Laterality: N/A;   CORONARY CTO INTERVENTION N/A 01/18/2019   Procedure: CORONARY CTO INTERVENTION;  Surgeon: Jordan, Peter M, MD;  Location: [facility] INVASIVE CV LAB;  Service: Cardiovascular;  Laterality: N/A;   CORONARY STENT INTERVENTION N/A  01/18/2019   Procedure: CORONARY STENT INTERVENTION;  Surgeon: Jordan, Peter M, MD;  Location: [facility] CV LAB;  Service: Cardiovascular;  Laterality: N/A;   ENDOMETRIAL ABLATION     ESOPHAGOGASTRODUODENOSCOPY (EGD) WITH PROPOFOL  N/A 11/05/2020   Procedure: ESOPHAGOGASTRODUODENOSCOPY (EGD) WITH PROPOFOL ;  Surgeon: Eartha Angelia Sieving, MD;  Location: [facility];  Service: Gastroenterology;  Laterality: N/A;   ESOPHAGOGASTRODUODENOSCOPY (EGD) WITH PROPOFOL  N/A 09/18/2022   Procedure: ESOPHAGOGASTRODUODENOSCOPY (EGD) WITH PROPOFOL ;  Surgeon: Eartha Angelia Sieving, MD;  Location: [facility];  Service: Gastroenterology;  Laterality: N/A;  10:30am;ASA 2   GIVENS CAPSULE STUDY N/A 11/18/2020   Procedure: GIVENS CAPSULE STUDY;  Surgeon: Eartha Angelia Sieving, MD;  Location: [facility];  Service: Gastroenterology;  Laterality: N/A;  7:30   JOINT REPLACEMENT     LEFT HEART CATH AND CORONARY ANGIOGRAPHY N/A 01/10/2019   Procedure: LEFT HEART CATH AND CORONARY ANGIOGRAPHY;  Surgeon: Claudene Victory ORN, MD;  Location: [facility]AS[facility] CV LAB;  Service: Cardiovascular;  Laterality: N/A;   POLYPECTOMY  11/05/2020   Procedure: POLYPECTOMY;  Surgeon: Eartha Angelia Sieving, MD;  Location: [facility];  Service: Gastroenterology;;   POLYPECTOMY  09/18/2022   Procedure: POLYPECTOMY;  Surgeon: Eartha Angelia Sieving, MD;  Location: [facility];  Service: Gastroenterology;;   TONSILLECTOMY     TOTAL HIP ARTHROPLASTY Right  12/04/2015   TOTAL HIP ARTHROPLASTY Right 12/04/2015   Procedure: TOTAL HIP ARTHROPLASTY ANTERIOR APPROACH;  Surgeon: Oneil JAYSON Herald, MD;  Location: [facility];  Service: Orthopedics;  Laterality: Right;   TOTAL HIP ARTHROPLASTY Left 01/24/2016   Procedure: TOTAL HIP ARTHROPLASTY ANTERIOR APPROACH;  Surgeon: Oneil JAYSON Herald, MD;  Location: [facility];  Service: Orthopedics;  Laterality: Left;  anterior approach   TUBAL LIGATION      Family History  Problem Relation Age of Onset   Pancreatic  cancer Maternal Grandmother    Psoriasis Father    Atrial fibrillation Father    Pancreatitis Father    Multiple sclerosis Mother    Arthritis/Rheumatoid Mother    Uterine cancer Mother        dx 72s   Ovarian cancer Mother        dx 49s   Skin cancer Mother    Hypertension Brother    Stomach cancer Maternal Aunt        60s-70s   Colon cancer Other    Anxiety disorder Daughter    Endometriosis Daughter     Social History Social History[1]  Allergies[2]  Current Outpatient Medications  Medication Sig Dispense Refill   amLODipine  (NORVASC ) 5 MG tablet Take 1 tablet by mouth once daily 90 tablet 0   aspirin  81 MG tablet Take 81 mg by mouth daily.     B-D 3CC LUER-LOK SYR 25GX1 25G X 1 3 ML MISC Inject into the muscle.     Biotin  10000 MCG TABS Take 10,000 mcg by mouth daily.     cyanocobalamin  (VITAMIN B12) 1000 MCG/ML injection Inject 1 mL (1,000 mcg total) into the muscle every 30 (thirty) days. 6 mL 0   diazepam  (VALIUM ) 5 MG tablet TAKE ONE TABLET BY MOUTH THREE TIMES DAILY AS NEEDED 90 tablet 5   DULoxetine  (CYMBALTA ) 60 MG capsule Take 1 capsule (60 mg total) by mouth at bedtime. 30 capsule 11   ergocalciferol  (VITAMIN D2) 1.25 MG (50000 UT) capsule Take 50,000 Units by mouth once a week.     furosemide (LASIX) 40 MG tablet Take 40 mg by mouth as needed.     gabapentin  (NEURONTIN ) 600 MG tablet Take 1 tablet (600 mg total) by mouth 4 (four) times daily as needed.     Insulin  Glargine (LANTUS SOLOSTAR Rogers) Inject 60 Units into the skin at bedtime. (Patient taking differently: Inject into the skin at bedtime. Takes 50 units in the am and 25 units at night)     levothyroxine  (SYNTHROID ) 112 MCG tablet Take 112 mcg by mouth daily before breakfast.     metoprolol  tartrate (LOPRESSOR ) 50 MG tablet TAKE ONE TABLET BY MOUTH TWICE DAILY 180 tablet 3   Needles & Syringes MISC For use to administer vitamin B12 injections at home. 16 each 0   nitroGLYCERIN  (NITROSTAT ) 0.4 MG SL tablet  Place 1 tablet (0.4 mg total) under the tongue every 5 (five) minutes x 3 doses as needed for chest pain (if no relief after 2nd dose, proceed to ED or call 911). 25 tablet 2   NOVOLOG  FLEXPEN 100 UNIT/ML FlexPen INJECT 5 UNITS SUBCUTANEOUSLY THREE TIMES DAILY WITH MEALS     ondansetron  (ZOFRAN -ODT) 4 MG disintegrating tablet Take 4 mg by mouth every 6 (six) hours as needed.     pantoprazole  (PROTONIX ) 20 MG tablet Take 20 mg by mouth daily.     Polyethyl Glycol-Propyl Glycol (SYSTANE OP) Place 1 drop into both eyes as needed (dry eyes).  polyethylene glycol (MIRALAX  / GLYCOLAX ) 17 g packet Take 17 g by mouth daily.     rosuvastatin  (CRESTOR ) 5 MG tablet Take 5 mg by mouth every Monday, Wednesday, and Friday.     triamcinolone cream (KENALOG) 0.1 % SMARTSIG:sparingly Topical Twice Daily     No current facility-administered medications for this visit.    Review of Systems Review of Systems  Blood pressure 113/68, pulse 77, height 5' 2 (1.575 m), weight 198 lb (89.8 kg).  Physical Exam Physical Exam  Data Reviewed   Assessment    Prepping with Betadine Local anesthesia with 2% Buffered Lidocaine  4  mm punch biopsy performed per protocol 3-0 chromic figure of eight suture placed Well tolerated  Specimen appropriately identified and sent to pathology    Plan    Follow-up:  [facility] visit Friday      Mary Strong 05/22/2024, 12:17 PM          [1]  Social History Tobacco Use   Smoking status: Never    Passive exposure: Never   Smokeless tobacco: Never  Vaping Use   Vaping status: Never Used  Substance Use Topics   Alcohol  use: Not Currently    Comment: none since 2014   Drug use: Not Currently    Comment: none in 6 minths  [2]  Allergies Allergen Reactions   Shellfish Allergy Anaphylaxis   Ciprofloxacin Hives   Copaxone [Glatiramer Acetate] Hives   Penicillins Hives and Rash    Bad headaches Did it involve swelling of the face/tongue/throat, SOB,  or low BP? No Did it involve sudden or severe rash/hives, skin peeling, or any reaction on the inside of your mouth or nose? No Did you need to seek medical attention at a hospital or doctor's office? No When did it last happen?      6 - 7 years If all above answers are NO, may proceed with cephalosporin use.    Vancomycin Itching

## 2024-05-23 LAB — SURGICAL PATHOLOGY

## 2024-05-25 ENCOUNTER — Encounter: Payer: Self-pay | Admitting: Obstetrics & Gynecology

## 2024-05-25 ENCOUNTER — Telehealth: Admitting: Obstetrics & Gynecology

## 2024-05-26 ENCOUNTER — Telehealth: Admitting: Obstetrics & Gynecology

## 2024-08-04 ENCOUNTER — Other Ambulatory Visit

## 2024-08-11 ENCOUNTER — Ambulatory Visit: Admitting: Oncology

## 2024-08-11 ENCOUNTER — Inpatient Hospital Stay: Admitting: Oncology
